# Patient Record
Sex: Male | Born: 1940
Health system: Southern US, Community
[De-identification: ages and names within clinical notes are randomized; demographics above are authoritative.]

## PROBLEM LIST (undated history)

## (undated) DIAGNOSIS — I071 Rheumatic tricuspid insufficiency: Secondary | ICD-10-CM

## (undated) DIAGNOSIS — I10 Essential (primary) hypertension: Secondary | ICD-10-CM

## (undated) DIAGNOSIS — I5032 Chronic diastolic (congestive) heart failure: Secondary | ICD-10-CM

## (undated) DIAGNOSIS — C189 Malignant neoplasm of colon, unspecified: Secondary | ICD-10-CM

## (undated) DIAGNOSIS — Z5181 Encounter for therapeutic drug level monitoring: Secondary | ICD-10-CM

## (undated) DIAGNOSIS — G4733 Obstructive sleep apnea (adult) (pediatric): Secondary | ICD-10-CM

## (undated) DIAGNOSIS — J449 Chronic obstructive pulmonary disease, unspecified: Secondary | ICD-10-CM

## (undated) DIAGNOSIS — D126 Benign neoplasm of colon, unspecified: Secondary | ICD-10-CM

## (undated) DIAGNOSIS — I499 Cardiac arrhythmia, unspecified: Secondary | ICD-10-CM

## (undated) DIAGNOSIS — E785 Hyperlipidemia, unspecified: Secondary | ICD-10-CM

## (undated) DIAGNOSIS — G629 Polyneuropathy, unspecified: Secondary | ICD-10-CM

## (undated) DIAGNOSIS — L409 Psoriasis, unspecified: Secondary | ICD-10-CM

## (undated) DIAGNOSIS — I4891 Unspecified atrial fibrillation: Secondary | ICD-10-CM

## (undated) DIAGNOSIS — Z9289 Personal history of other medical treatment: Secondary | ICD-10-CM

## (undated) DIAGNOSIS — I2510 Atherosclerotic heart disease of native coronary artery without angina pectoris: Secondary | ICD-10-CM

## (undated) DIAGNOSIS — I4821 Permanent atrial fibrillation: Secondary | ICD-10-CM

## (undated) DIAGNOSIS — R001 Bradycardia, unspecified: Secondary | ICD-10-CM

## (undated) DIAGNOSIS — I452 Bifascicular block: Secondary | ICD-10-CM

## (undated) DIAGNOSIS — T7840XA Allergy, unspecified, initial encounter: Secondary | ICD-10-CM

## (undated) DIAGNOSIS — Z7901 Long term (current) use of anticoagulants: Secondary | ICD-10-CM

## (undated) DIAGNOSIS — I272 Pulmonary hypertension, unspecified: Secondary | ICD-10-CM

## (undated) DIAGNOSIS — L271 Localized skin eruption due to drugs and medicaments taken internally: Secondary | ICD-10-CM

## (undated) DIAGNOSIS — I6529 Occlusion and stenosis of unspecified carotid artery: Secondary | ICD-10-CM

## (undated) HISTORY — DX: Occlusion and stenosis of unspecified carotid artery: I65.29

## (undated) HISTORY — DX: Chronic obstructive pulmonary disease, unspecified: J44.9

## (undated) HISTORY — PX: COLONOSCOPY: SHX174

## (undated) HISTORY — DX: Obstructive sleep apnea (adult) (pediatric): G47.33

## (undated) HISTORY — DX: Essential (primary) hypertension: I10

## (undated) HISTORY — DX: Polyneuropathy, unspecified: G62.9

## (undated) HISTORY — DX: Malignant neoplasm of colon, unspecified: C18.9

## (undated) HISTORY — DX: Permanent atrial fibrillation: I48.21

## (undated) HISTORY — DX: Hyperlipidemia, unspecified: E78.5

## (undated) HISTORY — DX: Encounter for therapeutic drug level monitoring: Z79.01

## (undated) HISTORY — DX: Psoriasis, unspecified: L40.9

## (undated) HISTORY — DX: Encounter for therapeutic drug level monitoring: Z51.81

## (undated) HISTORY — DX: Benign neoplasm of colon, unspecified: D12.6

## (undated) HISTORY — DX: Allergy, unspecified, initial encounter: T78.40XA

## (undated) HISTORY — PX: CARDIAC CATHETERIZATION: SHX172

## (undated) HISTORY — DX: Localized skin eruption due to drugs and medicaments taken internally: L27.1

## (undated) HISTORY — PX: TONSILLECTOMY: SUR1361

## (undated) HISTORY — DX: Atherosclerotic heart disease of native coronary artery without angina pectoris: I25.10

## (undated) HISTORY — DX: Personal history of other medical treatment: Z92.89

---

## 1997-03-19 DIAGNOSIS — I2510 Atherosclerotic heart disease of native coronary artery without angina pectoris: Secondary | ICD-10-CM

## 1997-03-19 HISTORY — DX: Atherosclerotic heart disease of native coronary artery without angina pectoris: I25.10

## 1997-03-19 HISTORY — PX: CORONARY STENT PLACEMENT: SHX1402

## 1998-01-11 ENCOUNTER — Inpatient Hospital Stay (HOSPITAL_COMMUNITY): Admission: RE | Admit: 1998-01-11 | Discharge: 1998-01-12 | Payer: Self-pay | Admitting: Cardiology

## 2001-06-01 ENCOUNTER — Emergency Department (HOSPITAL_COMMUNITY): Admission: EM | Admit: 2001-06-01 | Discharge: 2001-06-01 | Payer: Self-pay | Admitting: Emergency Medicine

## 2001-07-10 ENCOUNTER — Encounter: Payer: Self-pay | Admitting: Emergency Medicine

## 2001-07-10 ENCOUNTER — Encounter: Admission: RE | Admit: 2001-07-10 | Discharge: 2001-07-10 | Payer: Self-pay | Admitting: Emergency Medicine

## 2006-04-15 ENCOUNTER — Ambulatory Visit: Payer: Self-pay | Admitting: Gastroenterology

## 2006-04-19 DIAGNOSIS — C189 Malignant neoplasm of colon, unspecified: Secondary | ICD-10-CM

## 2006-04-19 HISTORY — DX: Malignant neoplasm of colon, unspecified: C18.9

## 2006-04-24 ENCOUNTER — Ambulatory Visit: Payer: Self-pay | Admitting: Gastroenterology

## 2006-04-24 ENCOUNTER — Encounter (INDEPENDENT_AMBULATORY_CARE_PROVIDER_SITE_OTHER): Payer: Self-pay | Admitting: Specialist

## 2006-04-24 LAB — CONVERTED CEMR LAB
BUN: 12 mg/dL (ref 6–23)
Creatinine, Ser: 0.7 mg/dL (ref 0.4–1.5)

## 2006-04-26 ENCOUNTER — Encounter: Admission: RE | Admit: 2006-04-26 | Discharge: 2006-04-26 | Payer: Self-pay | Admitting: Gastroenterology

## 2006-05-29 ENCOUNTER — Encounter (INDEPENDENT_AMBULATORY_CARE_PROVIDER_SITE_OTHER): Payer: Self-pay | Admitting: *Deleted

## 2006-05-29 ENCOUNTER — Inpatient Hospital Stay (HOSPITAL_COMMUNITY): Admission: RE | Admit: 2006-05-29 | Discharge: 2006-06-04 | Payer: Self-pay | Admitting: General Surgery

## 2006-05-29 HISTORY — PX: RIGHT COLECTOMY: SHX853

## 2006-06-04 ENCOUNTER — Ambulatory Visit: Payer: Self-pay | Admitting: Oncology

## 2006-06-28 ENCOUNTER — Ambulatory Visit (HOSPITAL_COMMUNITY): Admission: RE | Admit: 2006-06-28 | Discharge: 2006-06-28 | Payer: Self-pay | Admitting: General Surgery

## 2006-06-28 ENCOUNTER — Encounter (INDEPENDENT_AMBULATORY_CARE_PROVIDER_SITE_OTHER): Payer: Self-pay | Admitting: *Deleted

## 2006-07-23 ENCOUNTER — Ambulatory Visit: Payer: Self-pay | Admitting: Oncology

## 2006-07-25 LAB — COMPREHENSIVE METABOLIC PANEL
ALT: 23 U/L (ref 0–53)
Albumin: 3.8 g/dL (ref 3.5–5.2)
CO2: 28 mEq/L (ref 19–32)
Calcium: 9 mg/dL (ref 8.4–10.5)
Chloride: 102 mEq/L (ref 96–112)
Glucose, Bld: 95 mg/dL (ref 70–99)
Sodium: 138 mEq/L (ref 135–145)
Total Bilirubin: 1.2 mg/dL (ref 0.3–1.2)
Total Protein: 6.2 g/dL (ref 6.0–8.3)

## 2006-07-25 LAB — CBC WITH DIFFERENTIAL/PLATELET
BASO%: 1.6 % (ref 0.0–2.0)
Eosinophils Absolute: 0.2 10*3/uL (ref 0.0–0.5)
HCT: 41.8 % (ref 38.7–49.9)
LYMPH%: 27.8 % (ref 14.0–48.0)
MCHC: 33.9 g/dL (ref 32.0–35.9)
MONO#: 0.6 10*3/uL (ref 0.1–0.9)
NEUT#: 2.8 10*3/uL (ref 1.5–6.5)
NEUT%: 55.7 % (ref 40.0–75.0)
Platelets: 154 10*3/uL (ref 145–400)
RBC: 4.76 10*6/uL (ref 4.20–5.71)
WBC: 5 10*3/uL (ref 4.0–10.0)
lymph#: 1.4 10*3/uL (ref 0.9–3.3)

## 2006-08-08 LAB — COMPREHENSIVE METABOLIC PANEL
ALT: 21 U/L (ref 0–53)
Albumin: 4.5 g/dL (ref 3.5–5.2)
CO2: 24 mEq/L (ref 19–32)
Chloride: 103 mEq/L (ref 96–112)
Glucose, Bld: 89 mg/dL (ref 70–99)
Potassium: 4.5 mEq/L (ref 3.5–5.3)
Sodium: 138 mEq/L (ref 135–145)
Total Bilirubin: 0.8 mg/dL (ref 0.3–1.2)
Total Protein: 6.5 g/dL (ref 6.0–8.3)

## 2006-08-08 LAB — CBC WITH DIFFERENTIAL/PLATELET
BASO%: 1.7 % (ref 0.0–2.0)
Eosinophils Absolute: 0.1 10*3/uL (ref 0.0–0.5)
LYMPH%: 28.5 % (ref 14.0–48.0)
MCHC: 33.4 g/dL (ref 32.0–35.9)
MONO#: 0.7 10*3/uL (ref 0.1–0.9)
NEUT#: 3.2 10*3/uL (ref 1.5–6.5)
Platelets: 153 10*3/uL (ref 145–400)
RBC: 4.77 10*6/uL (ref 4.20–5.71)
RDW: 14 % (ref 11.2–14.6)
WBC: 5.7 10*3/uL (ref 4.0–10.0)
lymph#: 1.6 10*3/uL (ref 0.9–3.3)

## 2006-08-21 LAB — CBC WITH DIFFERENTIAL/PLATELET
BASO%: 0.9 % (ref 0.0–2.0)
LYMPH%: 26.5 % (ref 14.0–48.0)
MCHC: 35.2 g/dL (ref 32.0–35.9)
MONO#: 0.8 10*3/uL (ref 0.1–0.9)
MONO%: 13.9 % — ABNORMAL HIGH (ref 0.0–13.0)
NEUT#: 3.2 10*3/uL (ref 1.5–6.5)
Platelets: 145 10*3/uL (ref 145–400)
RBC: 4.39 10*6/uL (ref 4.20–5.71)
RDW: 16.1 % — ABNORMAL HIGH (ref 11.2–14.6)
WBC: 5.7 10*3/uL (ref 4.0–10.0)

## 2006-08-21 LAB — COMPREHENSIVE METABOLIC PANEL
ALT: 40 U/L (ref 0–53)
Albumin: 4.3 g/dL (ref 3.5–5.2)
Alkaline Phosphatase: 49 U/L (ref 39–117)
CO2: 24 mEq/L (ref 19–32)
Potassium: 4.6 mEq/L (ref 3.5–5.3)
Sodium: 140 mEq/L (ref 135–145)
Total Bilirubin: 1.1 mg/dL (ref 0.3–1.2)
Total Protein: 6.5 g/dL (ref 6.0–8.3)

## 2006-08-21 LAB — URINALYSIS, MICROSCOPIC - CHCC
Blood: NEGATIVE
Nitrite: NEGATIVE
pH: 6.5 (ref 4.6–8.0)

## 2006-09-04 LAB — CBC WITH DIFFERENTIAL/PLATELET
Basophils Absolute: 0.1 10*3/uL (ref 0.0–0.1)
EOS%: 1.9 % (ref 0.0–7.0)
HCT: 38.6 % — ABNORMAL LOW (ref 38.7–49.9)
HGB: 13.5 g/dL (ref 13.0–17.1)
LYMPH%: 27.3 % (ref 14.0–48.0)
MCH: 31.2 pg (ref 28.0–33.4)
MCV: 89 fL (ref 81.6–98.0)
MONO%: 11.7 % (ref 0.0–13.0)
NEUT%: 58.2 % (ref 40.0–75.0)
Platelets: 138 10*3/uL — ABNORMAL LOW (ref 145–400)
lymph#: 1.7 10*3/uL (ref 0.9–3.3)

## 2006-09-04 LAB — COMPREHENSIVE METABOLIC PANEL
ALT: 29 U/L (ref 0–53)
CO2: 26 mEq/L (ref 19–32)
Creatinine, Ser: 0.9 mg/dL (ref 0.40–1.50)
Total Bilirubin: 1.2 mg/dL (ref 0.3–1.2)

## 2006-09-07 ENCOUNTER — Ambulatory Visit: Payer: Self-pay | Admitting: Oncology

## 2006-09-18 LAB — CBC WITH DIFFERENTIAL/PLATELET
Eosinophils Absolute: 0.1 10*3/uL (ref 0.0–0.5)
HCT: 40.2 % (ref 38.7–49.9)
LYMPH%: 25.7 % (ref 14.0–48.0)
MCHC: 35.9 g/dL (ref 32.0–35.9)
MONO#: 0.7 10*3/uL (ref 0.1–0.9)
NEUT#: 3.7 10*3/uL (ref 1.5–6.5)
NEUT%: 60.9 % (ref 40.0–75.0)
Platelets: 140 10*3/uL — ABNORMAL LOW (ref 145–400)
WBC: 6 10*3/uL (ref 4.0–10.0)

## 2006-09-18 LAB — COMPREHENSIVE METABOLIC PANEL
CO2: 26 mEq/L (ref 19–32)
Creatinine, Ser: 0.99 mg/dL (ref 0.40–1.50)
Glucose, Bld: 90 mg/dL (ref 70–99)
Total Bilirubin: 1.4 mg/dL — ABNORMAL HIGH (ref 0.3–1.2)

## 2006-10-02 LAB — COMPREHENSIVE METABOLIC PANEL
CO2: 24 mEq/L (ref 19–32)
Calcium: 8.9 mg/dL (ref 8.4–10.5)
Creatinine, Ser: 0.81 mg/dL (ref 0.40–1.50)
Glucose, Bld: 107 mg/dL — ABNORMAL HIGH (ref 70–99)
Sodium: 139 mEq/L (ref 135–145)
Total Bilirubin: 1.5 mg/dL — ABNORMAL HIGH (ref 0.3–1.2)
Total Protein: 6.4 g/dL (ref 6.0–8.3)

## 2006-10-02 LAB — CBC WITH DIFFERENTIAL/PLATELET
Eosinophils Absolute: 0.1 10*3/uL (ref 0.0–0.5)
HCT: 38.2 % — ABNORMAL LOW (ref 38.7–49.9)
LYMPH%: 31.2 % (ref 14.0–48.0)
MONO#: 0.7 10*3/uL (ref 0.1–0.9)
NEUT#: 2.6 10*3/uL (ref 1.5–6.5)
NEUT%: 52.2 % (ref 40.0–75.0)
Platelets: 136 10*3/uL — ABNORMAL LOW (ref 145–400)
WBC: 5 10*3/uL (ref 4.0–10.0)

## 2006-10-10 LAB — PROTIME-INR: INR: 1.2 — ABNORMAL LOW (ref 2.00–3.50)

## 2006-10-14 LAB — PROTIME-INR
INR: 1.3 — ABNORMAL LOW (ref 2.00–3.50)
Protime: 15.6 Seconds — ABNORMAL HIGH (ref 10.6–13.4)

## 2006-10-16 LAB — COMPREHENSIVE METABOLIC PANEL
ALT: 36 U/L (ref 0–53)
BUN: 17 mg/dL (ref 6–23)
CO2: 25 mEq/L (ref 19–32)
Creatinine, Ser: 0.8 mg/dL (ref 0.40–1.50)
Total Bilirubin: 1.5 mg/dL — ABNORMAL HIGH (ref 0.3–1.2)

## 2006-10-16 LAB — CBC WITH DIFFERENTIAL/PLATELET
BASO%: 1 % (ref 0.0–2.0)
EOS%: 1.2 % (ref 0.0–7.0)
MCH: 33.4 pg (ref 28.0–33.4)
MCHC: 36.2 g/dL — ABNORMAL HIGH (ref 32.0–35.9)
NEUT%: 53.3 % (ref 40.0–75.0)
RDW: 16.6 % — ABNORMAL HIGH (ref 11.2–14.6)
lymph#: 1.7 10*3/uL (ref 0.9–3.3)

## 2006-10-16 LAB — PROTIME-INR: INR: 1.6 — ABNORMAL LOW (ref 2.00–3.50)

## 2006-10-17 ENCOUNTER — Ambulatory Visit: Payer: Self-pay | Admitting: Oncology

## 2006-10-21 LAB — PROTIME-INR
INR: 2.3 (ref 2.00–3.50)
Protime: 27.6 Seconds — ABNORMAL HIGH (ref 10.6–13.4)

## 2006-10-25 LAB — PROTIME-INR
INR: 1.3 — ABNORMAL LOW (ref 2.00–3.50)
Protime: 15.6 Seconds — ABNORMAL HIGH (ref 10.6–13.4)

## 2006-10-30 LAB — CBC WITH DIFFERENTIAL/PLATELET
BASO%: 0.5 % (ref 0.0–2.0)
Basophils Absolute: 0 10e3/uL (ref 0.0–0.1)
EOS%: 1.9 % (ref 0.0–7.0)
Eosinophils Absolute: 0.1 10e3/uL (ref 0.0–0.5)
HCT: 36.7 % — ABNORMAL LOW (ref 38.7–49.9)
HGB: 13.2 g/dL (ref 13.0–17.1)
LYMPH%: 27.5 % (ref 14.0–48.0)
MCH: 35 pg — ABNORMAL HIGH (ref 28.0–33.4)
MCHC: 36 g/dL — ABNORMAL HIGH (ref 32.0–35.9)
MCV: 97.2 fL (ref 81.6–98.0)
MONO#: 0.9 10e3/uL (ref 0.1–0.9)
MONO%: 15.3 % — ABNORMAL HIGH (ref 0.0–13.0)
NEUT#: 3.4 10e3/uL (ref 1.5–6.5)
NEUT%: 54.8 % (ref 40.0–75.0)
Platelets: 165 10e3/uL (ref 145–400)
RBC: 3.77 10e6/uL — ABNORMAL LOW (ref 4.20–5.71)
RDW: 20.4 % — ABNORMAL HIGH (ref 11.2–14.6)
WBC: 6.1 10e3/uL (ref 4.0–10.0)
lymph#: 1.7 10e3/uL (ref 0.9–3.3)

## 2006-10-30 LAB — COMPREHENSIVE METABOLIC PANEL
AST: 35 U/L (ref 0–37)
Alkaline Phosphatase: 66 U/L (ref 39–117)
BUN: 21 mg/dL (ref 6–23)
Creatinine, Ser: 0.85 mg/dL (ref 0.40–1.50)

## 2006-10-30 LAB — PROTIME-INR
INR: 2 (ref 2.00–3.50)
Protime: 24 s — ABNORMAL HIGH (ref 10.6–13.4)

## 2006-11-05 LAB — PROTIME-INR

## 2006-11-14 LAB — PROTIME-INR
INR: 1.9 — ABNORMAL LOW (ref 2.00–3.50)
Protime: 22.8 Seconds — ABNORMAL HIGH (ref 10.6–13.4)

## 2006-11-25 ENCOUNTER — Ambulatory Visit: Payer: Self-pay | Admitting: Oncology

## 2006-11-27 ENCOUNTER — Ambulatory Visit (HOSPITAL_COMMUNITY): Admission: RE | Admit: 2006-11-27 | Discharge: 2006-11-27 | Payer: Self-pay | Admitting: Oncology

## 2006-11-27 LAB — CBC WITH DIFFERENTIAL/PLATELET
BASO%: 0.7 % (ref 0.0–2.0)
LYMPH%: 20.5 % (ref 14.0–48.0)
MCHC: 35.2 g/dL (ref 32.0–35.9)
MONO#: 0.7 10*3/uL (ref 0.1–0.9)
RBC: 3.55 10*6/uL — ABNORMAL LOW (ref 4.20–5.71)
RDW: 15.1 % — ABNORMAL HIGH (ref 11.2–14.6)
WBC: 6.6 10*3/uL (ref 4.0–10.0)
lymph#: 1.3 10*3/uL (ref 0.9–3.3)

## 2006-11-27 LAB — COMPREHENSIVE METABOLIC PANEL
Alkaline Phosphatase: 81 U/L (ref 39–117)
Glucose, Bld: 111 mg/dL — ABNORMAL HIGH (ref 70–99)
Sodium: 136 mEq/L (ref 135–145)
Total Bilirubin: 1.1 mg/dL (ref 0.3–1.2)
Total Protein: 5.4 g/dL — ABNORMAL LOW (ref 6.0–8.3)

## 2006-11-27 LAB — PROTIME-INR: Protime: 49.2 Seconds — ABNORMAL HIGH (ref 10.6–13.4)

## 2006-11-28 LAB — PROTIME-INR
INR: 3.7 — ABNORMAL HIGH (ref 2.00–3.50)
Protime: 44.4 Seconds — ABNORMAL HIGH (ref 10.6–13.4)

## 2006-12-02 LAB — PROTIME-INR: INR: 1.4 — ABNORMAL LOW (ref 2.00–3.50)

## 2006-12-11 LAB — CBC WITH DIFFERENTIAL/PLATELET
Basophils Absolute: 0.1 10*3/uL (ref 0.0–0.1)
EOS%: 4.1 % (ref 0.0–7.0)
HGB: 13.8 g/dL (ref 13.0–17.1)
MCH: 36.4 pg — ABNORMAL HIGH (ref 28.0–33.4)
MCV: 101.5 fL — ABNORMAL HIGH (ref 81.6–98.0)
MONO%: 7.5 % (ref 0.0–13.0)
NEUT%: 64.6 % (ref 40.0–75.0)
RDW: 14.7 % — ABNORMAL HIGH (ref 11.2–14.6)

## 2006-12-11 LAB — PROTIME-INR: INR: 2.3 (ref 2.00–3.50)

## 2006-12-20 LAB — PROTIME-INR

## 2007-01-02 LAB — PROTIME-INR: INR: 1.9 — ABNORMAL LOW (ref 2.00–3.50)

## 2007-01-15 ENCOUNTER — Ambulatory Visit: Payer: Self-pay | Admitting: Oncology

## 2007-01-17 LAB — PROTIME-INR: INR: 2.2 (ref 2.00–3.50)

## 2007-03-07 ENCOUNTER — Ambulatory Visit: Payer: Self-pay | Admitting: Oncology

## 2007-04-30 ENCOUNTER — Ambulatory Visit: Payer: Self-pay | Admitting: Gastroenterology

## 2007-05-14 ENCOUNTER — Ambulatory Visit: Payer: Self-pay | Admitting: Gastroenterology

## 2007-05-20 ENCOUNTER — Ambulatory Visit: Payer: Self-pay | Admitting: Vascular Surgery

## 2007-06-05 ENCOUNTER — Ambulatory Visit: Payer: Self-pay | Admitting: Oncology

## 2007-06-09 LAB — CBC WITH DIFFERENTIAL/PLATELET
Basophils Absolute: 0 10*3/uL (ref 0.0–0.1)
Eosinophils Absolute: 0.3 10*3/uL (ref 0.0–0.5)
HCT: 45.3 % (ref 38.7–49.9)
HGB: 16 g/dL (ref 13.0–17.1)
LYMPH%: 18.1 % (ref 14.0–48.0)
MCV: 93.2 fL (ref 81.6–98.0)
MONO#: 0.7 10*3/uL (ref 0.1–0.9)
MONO%: 8.2 % (ref 0.0–13.0)
NEUT#: 5.8 10*3/uL (ref 1.5–6.5)
NEUT%: 69.8 % (ref 40.0–75.0)
Platelets: 183 10*3/uL (ref 145–400)

## 2007-06-09 LAB — COMPREHENSIVE METABOLIC PANEL
Alkaline Phosphatase: 64 U/L (ref 39–117)
BUN: 21 mg/dL (ref 6–23)
CO2: 29 mEq/L (ref 19–32)
Glucose, Bld: 109 mg/dL — ABNORMAL HIGH (ref 70–99)
Total Bilirubin: 1 mg/dL (ref 0.3–1.2)

## 2007-06-09 LAB — CEA: CEA: 0.5 ng/mL (ref 0.0–5.0)

## 2007-06-10 ENCOUNTER — Encounter: Admission: RE | Admit: 2007-06-10 | Discharge: 2007-06-10 | Payer: Self-pay | Admitting: Oncology

## 2007-07-15 ENCOUNTER — Ambulatory Visit (HOSPITAL_BASED_OUTPATIENT_CLINIC_OR_DEPARTMENT_OTHER): Admission: RE | Admit: 2007-07-15 | Discharge: 2007-07-15 | Payer: Self-pay | Admitting: Otolaryngology

## 2007-12-11 ENCOUNTER — Ambulatory Visit: Payer: Self-pay | Admitting: Oncology

## 2007-12-15 ENCOUNTER — Encounter: Payer: Self-pay | Admitting: Gastroenterology

## 2008-03-17 ENCOUNTER — Ambulatory Visit: Payer: Self-pay | Admitting: Oncology

## 2008-03-22 ENCOUNTER — Ambulatory Visit: Payer: Self-pay | Admitting: Oncology

## 2008-05-28 ENCOUNTER — Ambulatory Visit: Payer: Self-pay | Admitting: Oncology

## 2008-06-01 ENCOUNTER — Ambulatory Visit (HOSPITAL_COMMUNITY): Admission: RE | Admit: 2008-06-01 | Discharge: 2008-06-01 | Payer: Self-pay | Admitting: Oncology

## 2008-06-01 LAB — COMPREHENSIVE METABOLIC PANEL
ALT: 50 U/L (ref 0–53)
AST: 41 U/L — ABNORMAL HIGH (ref 0–37)
Alkaline Phosphatase: 45 U/L (ref 39–117)
CO2: 29 mEq/L (ref 19–32)
Sodium: 138 mEq/L (ref 135–145)
Total Bilirubin: 1.7 mg/dL — ABNORMAL HIGH (ref 0.3–1.2)
Total Protein: 6.8 g/dL (ref 6.0–8.3)

## 2008-06-01 LAB — CBC WITH DIFFERENTIAL/PLATELET
BASO%: 0.5 % (ref 0.0–2.0)
EOS%: 4.2 % (ref 0.0–7.0)
LYMPH%: 26.3 % (ref 14.0–49.0)
MCHC: 34.5 g/dL (ref 32.0–36.0)
MONO#: 0.4 10*3/uL (ref 0.1–0.9)
Platelets: 154 10*3/uL (ref 140–400)
RBC: 4.84 10*6/uL (ref 4.20–5.82)
WBC: 5.4 10*3/uL (ref 4.0–10.3)
lymph#: 1.4 10*3/uL (ref 0.9–3.3)

## 2008-06-01 LAB — CEA: CEA: 0.8 ng/mL (ref 0.0–5.0)

## 2008-06-03 ENCOUNTER — Encounter: Payer: Self-pay | Admitting: Gastroenterology

## 2008-11-30 ENCOUNTER — Ambulatory Visit: Payer: Self-pay | Admitting: Oncology

## 2008-12-02 ENCOUNTER — Encounter: Payer: Self-pay | Admitting: Gastroenterology

## 2008-12-02 LAB — COMPREHENSIVE METABOLIC PANEL
Albumin: 4.5 g/dL (ref 3.5–5.2)
CO2: 24 mEq/L (ref 19–32)
Glucose, Bld: 98 mg/dL (ref 70–99)
Potassium: 4.6 mEq/L (ref 3.5–5.3)
Sodium: 138 mEq/L (ref 135–145)
Total Protein: 6.7 g/dL (ref 6.0–8.3)

## 2008-12-02 LAB — CEA: CEA: 1.2 ng/mL (ref 0.0–5.0)

## 2009-03-29 ENCOUNTER — Encounter (INDEPENDENT_AMBULATORY_CARE_PROVIDER_SITE_OTHER): Payer: Self-pay | Admitting: *Deleted

## 2009-04-25 ENCOUNTER — Encounter (INDEPENDENT_AMBULATORY_CARE_PROVIDER_SITE_OTHER): Payer: Self-pay | Admitting: *Deleted

## 2009-04-25 ENCOUNTER — Ambulatory Visit: Payer: Self-pay | Admitting: Gastroenterology

## 2009-05-11 ENCOUNTER — Ambulatory Visit: Payer: Self-pay | Admitting: Gastroenterology

## 2009-05-15 ENCOUNTER — Encounter: Payer: Self-pay | Admitting: Gastroenterology

## 2009-05-23 DIAGNOSIS — Z9289 Personal history of other medical treatment: Secondary | ICD-10-CM

## 2009-05-23 HISTORY — DX: Personal history of other medical treatment: Z92.89

## 2009-05-24 ENCOUNTER — Ambulatory Visit: Payer: Self-pay | Admitting: Oncology

## 2009-05-26 ENCOUNTER — Encounter: Payer: Self-pay | Admitting: Gastroenterology

## 2009-05-26 ENCOUNTER — Ambulatory Visit (HOSPITAL_COMMUNITY): Admission: RE | Admit: 2009-05-26 | Discharge: 2009-05-26 | Payer: Self-pay | Admitting: Oncology

## 2009-05-26 LAB — CBC WITH DIFFERENTIAL/PLATELET
Eosinophils Absolute: 0.3 10*3/uL (ref 0.0–0.5)
HCT: 49.3 % (ref 38.4–49.9)
LYMPH%: 22.9 % (ref 14.0–49.0)
MONO#: 0.4 10*3/uL (ref 0.1–0.9)
NEUT#: 3.8 10*3/uL (ref 1.5–6.5)
NEUT%: 64.1 % (ref 39.0–75.0)
Platelets: 194 10*3/uL (ref 140–400)
WBC: 5.9 10*3/uL (ref 4.0–10.3)

## 2009-05-26 LAB — CEA: CEA: 1.2 ng/mL (ref 0.0–5.0)

## 2009-05-26 LAB — COMPREHENSIVE METABOLIC PANEL
CO2: 28 mEq/L (ref 19–32)
Creatinine, Ser: 0.89 mg/dL (ref 0.40–1.50)
Glucose, Bld: 105 mg/dL — ABNORMAL HIGH (ref 70–99)
Total Bilirubin: 1.3 mg/dL — ABNORMAL HIGH (ref 0.3–1.2)
Total Protein: 7 g/dL (ref 6.0–8.3)

## 2009-05-30 ENCOUNTER — Encounter: Payer: Self-pay | Admitting: Gastroenterology

## 2009-08-08 ENCOUNTER — Ambulatory Visit: Payer: Self-pay | Admitting: Gastroenterology

## 2009-08-08 LAB — CONVERTED CEMR LAB: Total Protein: 6.6 g/dL (ref 6.0–8.3)

## 2009-09-08 ENCOUNTER — Emergency Department (HOSPITAL_COMMUNITY): Admission: EM | Admit: 2009-09-08 | Discharge: 2009-09-08 | Payer: Self-pay | Admitting: Emergency Medicine

## 2010-01-13 DIAGNOSIS — Z9289 Personal history of other medical treatment: Secondary | ICD-10-CM

## 2010-01-13 HISTORY — DX: Personal history of other medical treatment: Z92.89

## 2010-01-24 ENCOUNTER — Ambulatory Visit: Payer: Self-pay | Admitting: Oncology

## 2010-01-26 ENCOUNTER — Ambulatory Visit (HOSPITAL_COMMUNITY): Admission: RE | Admit: 2010-01-26 | Discharge: 2010-01-26 | Payer: Self-pay | Admitting: Oncology

## 2010-01-26 LAB — CEA: CEA: 0.8 ng/mL (ref 0.0–5.0)

## 2010-01-26 LAB — COMPREHENSIVE METABOLIC PANEL
ALT: 41 U/L (ref 0–53)
CO2: 28 mEq/L (ref 19–32)
Chloride: 102 mEq/L (ref 96–112)
Sodium: 140 mEq/L (ref 135–145)
Total Bilirubin: 1 mg/dL (ref 0.3–1.2)
Total Protein: 7.1 g/dL (ref 6.0–8.3)

## 2010-01-30 ENCOUNTER — Encounter: Payer: Self-pay | Admitting: Gastroenterology

## 2010-04-09 ENCOUNTER — Encounter: Payer: Self-pay | Admitting: Gastroenterology

## 2010-04-09 ENCOUNTER — Encounter: Payer: Self-pay | Admitting: Oncology

## 2010-04-18 NOTE — Op Note (Signed)
Summary: Laparoscopic-assisted right colectomy   NAME:  [name], [name]              ACCOUNT NO.:  [id number]   MEDICAL RECORD NO.:  [id number]          PATIENT TYPE:  INP   LOCATION:  1614                         FACILITY:  [facility]   PHYSICIAN:  Adolph Pollack, M.D.DATE OF BIRTH:  May 29, 1940   DATE OF PROCEDURE:  05/29/2006  DATE OF DISCHARGE:                               OPERATIVE REPORT   PREOP DIAGNOSIS:  Right colon cancer.   POSTOPERATIVE DIAGNOSIS:  Right colon cancer.   PROCEDURE:  Laparoscopic-assisted right colectomy.   SURGEON:  Adolph Pollack, M.D.   ASSISTANT:  Anselm Pancoast. Zachery Dakins, M.D.   ANESTHESIA:  General.   INDICATION:  This is a 70 year old male who had some of occult blood in  his stool and underwent a colonoscopy.  He is found to have a lesion  near the cecum; and was biopsied and positive for adenocarcinoma.  A CT  scan did not demonstrate any obvious metastatic disease.  He is now  brought to the operating room for a laparoscopically-assisted right  colectomy.  The procedure and risks have been discussed with him  preoperatively.   TECHNIQUE:  He was seen in the holding area; then brought to the  operating room, placed supine on the operating table; and a general  anesthetic was administered.  The Foley catheter was placed in the  bladder.  The abdominal wall was sterilely prepped and draped.  A  subumbilical incision was made through the skin, subcutaneous tissue,  fascial layers, and peritoneum; entering the peritoneal cavity under  direct vision.  A pursestring suture of #0 Vicryl was placed around the  fascial edges.  A Hassan trocar was introduced into the peritoneal  cavity; and a pneumoperitoneum created by insufflation of CO2 gas.   Following this the laparoscope was introduced.  A 10-mm trocar was then  placed in the left upper quadrant and a 5-mm trocar placed in the right  lower quadrant.  I began by mobilizing the distal ileum and  cecum  dividing lateral attachments using the harmonic scalpel.  The right  ureter was identified; and the plane of dissection kept anterior to it.  I continued to mobilize the right colon and using the harmonic scalpel;  and some blunt sweeping type dissection up to the level of the hepatic  flexure.  I then used the harmonic scalpel to mobilize the hepatic  flexure by dividing some of its thin and fatty-type attachments and  mobilize the transverse colon to approximately its midpoint.  I was able  then, to take the hepatic flexure and bring it down to the subumbilical  region without tension.   At this point, I removed the subumbilical trocar and made an extraction  site incision to a limited lower midline incision through all layers.  I  placed a wound protection device in; and brought the cecum and distal  ileum up into the wound; and then divided the distal ileum with the GIA  stapler.  I then brought the transverse colon up into the wound and  dissected the omentum free from it.  I divided the  transverse colon with  the GIA stapler one-third of the way from the hepatic flexure.  I then  divided the mesentery with a LigaSure; and divided the named vessels  between clamps and ligated them.  The specimen was handed off the field;  and the lesion was easily palpable in the area of the cecum.   Following this a side-to-side staple anastomosis was performed between  the distal ileum; and the transverse colon.  The common defect was then  closed in two layers, first a running 3-0 Vicryl full-thickness suture  was used.  Second, interrupted 3-0 silk sutures were used in a Lembert-  type fashion.  A crotch stitch was placed also using 3-0 Vicryl suture.  The anastomosis was patent, viable, and under no tension.  It was  dropped back into the abdominal cavity.   Gloves were changed at this time.  Three liters of saline was used  irrigate out the abdominal cavity.  The fluid was evacuated.   There is  no obvious bleeding noted.  I asked for a sponge count which was  reported to be correct.  The fascia was then closed with a running #1  PDS suture; and at this time the sponge, instrument, and needle count  was reported to be correct.  I subsequently reinsufflated the abdomen  and inspected it; and there was no bleeding noted; and the fascial  closure was solid.  The pneumoperitoneum was released and the remaining  trocars were removed.  All skin incisions were then closed with staples;  and sterile dressings were applied.   He tolerated procedure well without any apparent complications; and was  taken to recovery room in satisfactory condition.      Adolph Pollack, M.D.  Electronically Signed     TJR/MEDQ  D:  05/29/2006  T:  05/31/2006  Job:  [id number]   cc:   Mosetta Putt, M.D.  Fax: [phone]   Venita Lick. Russella Dar, MD, FACG  [address]  [place]  [place] [address]            SP Surgical Pathology - STATUS: Final             By: Gwenlyn Saran  ,        Perform Date: 12Mar08 00:01  Ordered By: Robley Fries        Ordered Date: [phone] 08:32  Facility: [facility]                              Department: CPATH  Service Report Text  [facility]   [address] [place], [place] [address]   [phone]    REPORT OF SURGICAL PATHOLOGY    Case #: [id number]   Patient Name: [name], [name].   Office Chart Number: N/A    MRN: [id number]   Pathologist: Berneta Levins, MD   DOB/Age 04-18-1940 (Age: 86) Gender: M   Date Taken: 05/29/2006   Date Received: 05/30/2006    FINAL DIAGNOSIS    ***MICROSCOPIC EXAMINATION AND DIAGNOSIS***    INTESTINE, SEGMENTAL RESECTION, TERMINAL ILEUM AND RIGHT COLON:   - INVASIVE COLONIC ADENOCARCINOMA, MODERATELY   DIFFERENTIATED, 4.2 CM.   - TUMOR IS OCCURRING IN A TUBULOVILLOUS ADENOMA.   - TUMOR EXTENDS INTO PERI-INTESTINAL SOFT TISSUE.   - MARGINS NEGATIVE.   - SEPARATE 0.8 CM  TUBULAR ADENOMA.    LYMPH  NODES, PERICOLONIC, REGIONAL RESECTION:   - ONE (1) OF TWENTY-TWO (22) LYMPH NODES POSITIVE FOR   METASTATIC COLONIC   ADENOCARCINOMA.    APPENDIX, RESECTION:   - NEGATIVE FOR TUMOR.   - FIBROUS LUMINAL OBLITERATION.    COMMENT   ONCOLOGY TABLE- COLON AND RECTUM    1. Maximum tumor size (cm): 4.2 cm   2. Histology: Adenocarcinoma   3. Grade: G2, moderately differentiated   4. Margins: proximal negative; distal negative;   radial negative   Distance of invasive carcinoma from nearest margin:   radial at 0.5 cm   5. Perforation of visceral peritoneum: Negative   6. Depth of invasion: Into peri-intestinal soft tissue   7. Vascular/lymphatic invasion: Present, LVI   8. Lymph nodes: # examined 22; # positive 1   9. TNM code: pT3, pN1, pMX   10. Comments: There is a 4.2 cm moderately   differentiated invasive colorectal adenocarcinoma of the cecum   that invades into peri-intestinal soft tissue where the serosa is   retracted. Focal residual tubulovillous adenoma is seen at the   edge of the tumor. Lymphovascular space invasion is present. The   margins including the nearest radial margin are negative for   tumor and the tumor does not involve the visceral peritoneum.   There is a separate 0.8 cm tubular adenoma without high grade   dysplasia or carcinoma. In the peri-intestinal soft tissue one   of twenty-two lymph nodes is involved with metastatic   adenocarcinoma. One fragment of tissue felt to possibly   represent a lymph node is a lobulated portion of adipose tissue.   The appendix shows fibrous luminal obliteration. (JAS:caf   05/31/06)    cf   Date Reported: 05/31/2006 Berneta Levins, MD   *** Electronically Signed Out By JAS ***    Clinical information   Right colon cancer (ni)    specimen(s) obtained   Intestine, segmental resection for tumor, terminal ileum and   right colon    Gross Description   Specimen: Ileocolonic segment including  appendix, in formalin   and the margins are stapled closed.   Length: 18 cm consisting of 5 cm of distal ileum, cecum and 13   cm of proximal colon. The appendix measures 7 cm in length x 0.5   cm in diameter and the serosal and cut surfaces are not   remarkable.  Serosa: Smooth and glistening except for a 1 x 0.5 cm retracted   area along the inferior lateral wall of the cecum.   Tumor location: Mucosal surface of the inferior lateral wall of   the cecum.   Tumor size (cm): Tan to red sessile lesion measuring 4.2 cm in   length x 3 cm and is raised up to 0.6 cm. Approximately one-half   of the external surface is granular to nodular whereas the   remaining half shows an ulcerated area measuring 2 x 1 x 0.1 to   0.2 cm in depth.   % Circumference involved by tumor: 30%.   Depth of invasion: The cut surfaces show firm whitish-gray   tissue that extends into but not through the entire thickness of   the muscular portion of the wall except for a central 0.5 cm area   that appears to extend through the wall and into but not through   pericolonic fat for a distance of 0.1 0.2 cm. The pericolonic   soft tissue margin appears to measure at least 0.5 cm. The  mesenteric margin measure at least 3.0cm   Margins: proximal 5 cm; distal 13 cm; radial/mesenteric 0.5   cm/3 cm.   Other mucosal lesions: Within the cecum on the posterior wall 1   cm from the previously described neoplasm is a tan to focally   dark red polypoid lesion measuring 0.8 x 0.4 cm and raised 0.3   cm.   Lymph nodes: Twenty-three ovoid structures tentatively   identified as lymph nodes are dissected from the mesentery that   measure up to 1.5 cm in greatest dimension and none show gross   evidence of metastatic tumor.   Block summary: The serosal and pericolonic surfaces associated   with the neoplasm are marked with black ink and sections are   submitted as follows:   A = shave section of proximal small bowel margin,  bisected   B and C = shave sections of distal colon margin   D J = neoplasm and the serosal and pericolonic margins are   marked with black ink   K = small cecal polyp   L = five mesenteric lymph nodes   M = five mesenteric lymph nodes   N = five mesenteric lymph nodes   O = five mesenteric lymph nodes   P = three mesenteric lymph nodes   Q = appendix   Total number of cassettes seventeen (BM:caf 05/30/06)    cf/

## 2010-04-18 NOTE — Procedures (Signed)
Summary: Colonoscopy  Patient: [name] Note: All result statuses are Final unless otherwise noted.  Tests: (1) Colonoscopy (COL)   COL Colonoscopy           DONE     [facility]     [address]     [place], [place]  [address]           COLONOSCOPY PROCEDURE REPORT           PATIENT:  [name], [name]  MR#:  [id number]     BIRTHDATE:  [birth year], 68 yrs. old  GENDER:  male           ENDOSCOPIST:  Judie Petit T. Russella Dar, MD, [facility]           PROCEDURE DATE:  05/11/2009     PROCEDURE:  Colonoscopy with biopsy, snare polypectomy and hot     biopsy     ASA CLASS:  Class III     INDICATIONS:  1) follow-up of cancer -T3, N1 colon adenocarcinoma,     04/2003.           MEDICATIONS:   Fentanyl 100 mcg IV, Versed 10 mg IV           DESCRIPTION OF PROCEDURE:   After the risks benefits and     alternatives of the procedure were thoroughly explained, informed     consent was obtained.  Digital rectal exam was performed and     revealed no abnormalities.   The LB [id number]-Q180AL [id number] endoscope     was introduced through the anus and advanced to the anastamosis,     without limitations.  The quality of the prep was good, using     MoviPrep.  The instrument was then slowly withdrawn as the colon     was fully examined.     <<PROCEDUREIMAGES>>           FINDINGS:  The right colon was surgically resected and an     ileo-colonic anastamosis was seen in the ascending colon.  There     was a surgical anastomosis in the ascending colon.  Two polyps     were found in the distal transverse colon. They were 4 mm in size.     With hot biopsy forceps, the polyps were cauterized, biopsies were     obtained and sent to pathology.  A sessile polyp was found in the     mid transverse colon. It was 3 mm in size. The polyp was removed     using cold biopsy forceps. A sessile polyp was found in the     proximal transverse colon. It was 5 mm in size. Polyp was snared     without cautery.  Retrieval  was successful.  A sessile polyp was     found in the rectum. It was 4 mm in size. With hot biopsy forceps     the polyps was cauterized, biopsy was obtained and sent to     pathology.  This was otherwise a normal examination of the colon.     Retroflexed views in the rectum revealed internal hemorrhoids,     small.  The time to cecum =  2.67  minutes. The scope was then     withdrawn (time =  12.33  min) from the patient and the procedure     completed.           COMPLICATIONS:  None  ENDOSCOPIC IMPRESSION:     1) Prior right hemi-colectomy     2) Anastomosis in the ascending colon     3) 4 mm Two polyps in the distal transverse colon     4) 3 mm sessile polyp in the mid transverse colon     5) 5 mm sessile polyp in the proximal transverse colon     6) 4 mm sessile polyp in the rectum     7) Internal hemorrhoids           RECOMMENDATIONS:     1) Await pathology results     2) No aspirin or NSAID's for 2 weeks     3) Resume Coumadin today           Amiere Cawley T. Russella Dar, MD, Clementeen Graham           CC: Rolm Baptise, MD, Kirby Funk, MD           n.     Rosalie Doctor:   Venita Lick. Kikue Gerhart at 05/11/2009 04:11 PM           Katha Hamming, [id number]  Note: An exclamation mark (!) indicates a result that was not dispersed into the flowsheet. Document Creation Date: 05/11/2009 4:12 PM _______________________________________________________________________  (1) Order result status: Final Collection or observation date-time: 05/11/2009 15:52 Requested date-time:  Receipt date-time:  Reported date-time:  Referring Physician:   Ordering Physician: Claudette Head [phone]) Specimen Source:  Source: Launa Grill Order Number: [id number] Lab site:   Appended Document: Colonoscopy     Procedures Next Due Date:    Colonoscopy: 04/2011

## 2010-04-18 NOTE — Op Note (Signed)
Summary: Insert Port-A-Cath  NAME:  [name], [name]              ACCOUNT NO.:  [id number]   MEDICAL RECORD NO.:  [id number]          PATIENT TYPE:  AMB   LOCATION:  DAY                          [facility]ACILITY[facility]  [facility]   PHYSICIAN:  Adolph Pollack, M.D.DATE OF BIRTH:  July 12, 1940   DATE OF PROCEDURE:  06/28/2006  DATE OF DISCHARGE:                               OPERATIVE REPORT   PREOPERATIVE DIAGNOSIS:  Colon cancer.   POSTOPERATIVE DIAGNOSIS:  Colon cancer.   PROCEDURE:  Insertion of single-lumen port-A-Cath into left subclavian  vein under fluoroscopic guidance.   SURGEON:  Adolph Pollack, M.D.   ANESTHESIA:  MAC plus local (lidocaine and Marcaine mixed).   INDICATIONS:  This is a 70 year old male with stage III colon cancer.  He requires long-term venous access for chemotherapy.  He now presents  for port-A-Cath insertion.   TECHNIQUE:  He was brought to the operating room, placed supine on the  operating table, and intravenous sedation was given.  A roll was placed  posteriorly.  The hair on the upper chest and neck was clipped and the  area sterilely prepped and draped.  Local anesthetic was infiltrated in  the left subclavian region.  A 16-gauge needle was used to cannulate the  left subclavian vein, and a wire passed in the superior vena cava  verified by fluoroscopy.  A larger incision was made around the wire and  the tract dilated.  Local anesthetic was infiltrated into the left upper  chest wall superficially and deep, and a larger incision was made  through the skin and subcutaneous tissue using electrocautery.  A pocket  for the port was created.  The catheter was then tunneled from the  inferior up through the superior incision.  The dilator introducer  complex was then placed over the wire and into the left subclavian vein.  The dilator and wire were removed, and the catheter was threaded through  the peel-away sheath into the right heart.  The peel-away sheath  was  removed.  Under fluoroscopic guidance, I pulled the catheter back until  the tip was at the junction of the superior vena cava and right atrium.  The catheter was then cut and threaded onto the port.  The port easily  aspirated blood and flushed.  The port was anchored to the chest wall  with interrupted 2-0 Vicryl sutures.  Concentrated solution was then  placed in the port.   The subcutaneous tissue was closed over the port with running 3-0 Vicryl  suture.  Both skin incisions were closed with 4-0 Monocryl subcuticular  stitches.  Steri-Strips and sterile dressings were applied.   He tolerated the procedure without any apparent complications and was  taken to recovery in satisfactory condition.  Discharge instructions  were given.     Adolph Pollack, M.D.  Electronically Signed    TJR/MEDQ  D:  06/28/2006  T:  06/28/2006  Job:  [id number]

## 2010-04-18 NOTE — Procedures (Signed)
Summary: Colonoscopy and biopsy   Colonoscopy  Procedure date:  04/24/2006  Findings:      Results: Colon Cancer.      Location:  [facility].    Procedures Next Due Date:    Colonoscopy: 04/2008  Colonoscopy  Procedure date:  04/24/2006  Findings:      Results: Colon Cancer.      Location:  [facility].    Procedures Next Due Date:    Colonoscopy: 04/2008 Patient Name: [name], [name] MRN:  Procedure Procedures: Colonoscopy CPT: 04540.    with biopsy. CPT: Q5068410.    with polypectomy. CPT: A3573898.  Personnel: Endoscopist: Venita Lick. Russella Dar, MD, Clementeen Graham. Resident: Mosetta Putt, Judie Petit D.  Exam Location: Exam performed in Outpatient Clinic. Outpatient  Patient Consent: Procedure, Alternatives, Risks and Benefits discussed, consent obtained, from patient. Consent was obtained by the RN.  Indications  Average Risk Screening Routine.  History  Current Medications: Patient is not currently taking Coumadin.  Pre-Exam Physical: Performed Apr 24, 2006. Cardio-pulmonary exam, Rectal exam, HEENT exam , Abdominal exam, Mental status exam WNL.  Comments: Pt. history reviewed/updated, physical exam performed prior to initiation of sedation?Yes Exam Exam: Extent of exam reached: Cecum, extent intended: Cecum.  The cecum was identified by appendiceal orifice and IC valve. Time to Cecum: 00:04: 26. Time for Withdrawl: 00:10:51. Colon retroflexion performed. ASA Classification: II. Tolerance: excellent.  Monitoring: Pulse and BP monitoring, Oximetry used. Supplemental O2 given.  Colon Prep Used MoviPrep for colon prep. Prep results: good.  Sedation Meds: Patient assessed and found to be appropriate for moderate (conscious) sedation. Fentanyl 75 mcg. given IV. Versed 9 mg. given IV.  Findings NORMAL EXAM: Ascending Colon to Descending Colon.  POLYP: Cecum, Maximum size: 6 mm. sessile polyp. not removed, ICD9: Colon Polyps: 211.3.  - TUMOR: in  Cecum. Length: 4 cm, fungating, with no obstruction. Biopsy/Tumor taken. 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  POLYP: Sigmoid Colon, Maximum size: 10 mm. pedunculated polyp. Procedure:  snare with cautery, removed, retrieved, Polyp sent to pathology. ICD9: Colon Polyps: 211.3.  HEMORRHOIDS: Internal. Not bleeding. Not thrombosed. ICD9: Hemorrhoids, Internal: 455.0.   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  211.3: Colon Polyps.  455.0: Hemorrhoids, Internal.   Events  Unplanned Interventions: No intervention was required.  Unplanned Events: There were no complications. Plans  Post Exam Instructions: Post sedation instructions given.  Medication Plan: Await pathology.  Patient Education: Patient given standard instructions for: Polyps. Hemorrhoids.  Disposition: After procedure patient sent to recovery. After recovery patient sent home.  Scheduling/Referral: Colonoscopy, to [facility] T. Russella Dar, MD, [facility], around Apr 24, 2008.  Surgery, for consideration of R hemicolectomy, next available appt,    Patient Name: [name], Burkitt MRN:  Amendment 1 - Jun 03, 2006 0 Procedure Personnel: DELETED<<Resident: Mosetta Putt, M.D. >>DELETED Referred By: Mosetta Putt, M.D.  [Fellow] This report was created from the original endoscopy report, which was reviewed and signed by the above listed endoscopist.    SP Surgical Pathology - STATUS: Final             By: Threasa Beards  ,        Perform Date: 6 Feb08 00:01  Ordered By: Rica Records Date: 7 Feb08 07:44  Facility: [facility]  Department: CPATH  Service Report Text  [facility]   [address] [address]   [place], [place] [address]   Telephone [phone] or [phone] Fax [phone]    REPORT OF SURGICAL PATHOLOGY    Case #: [id number]   Patient Name: [name], Keller.   Office Chart Number: N/A    MRN:  [id number]   Pathologist: Havery Moros, MD   DOB/Age [birth year] (Age: 62) Gender: M   Date Taken: 04/24/2006   Date Received: 04/24/2006    FINAL DIAGNOSIS    ***MICROSCOPIC EXAMINATION AND DIAGNOSIS***    1. CECUM, BIOPSY: INVASIVE ADENOCARCINOMA, SEE COMMENT.    2. SIGMOID COLON, POLYP(S): ADENOMATOUS POLYP(S). NO HIGH   GRADE DYSPLASIA OR INVASIVE MALIGNANCY IDENTIFIED.    COMMENT   1. 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 Dr. Clelia Croft reviewed this biopsy and concurs. The   results were discussed with Dr. Russella Dar on 04/27/06 by Dr. Laureen Ochs.   (BNS:cdc:04/26/06)    cc   Date Reported: 04/26/2006 Havery Moros, MD   *** Electronically Signed Out By BNS ***    Clinical information   R/O cancer; screening (gt)    specimen(s) obtained   1: Colon, biopsy, cecum mass   2: Colon, polyp(s), sigmoid    Gross Description   1. Received in formalin are tan, soft tissue fragments that are   submitted in toto. Number: Five   Size: 0.2 to 0.3 cm    2. Received in formalin is a polypoid portion of soft tan tissue   that measures 0.8 x 0.6 x 0.5 cm.   Block Summary: The base is marked with black ink and base is   bisected and submitted entirely in one cassette. (BM:mj 04/25/06)    mj/

## 2010-04-18 NOTE — Letter (Signed)
Summary: Colonoscopy Letter  [facility]  [address] [place], [place] [address]   Phone: [phone]  Fax: [phone]      March 29, 2009 MRN: [id number]   [facility] [address] [place], [place]  [address]   Dear Mr. Deupree,   According to your medical record, it is time for you to schedule a Colonoscopy. The American Cancer Society recommends this procedure as a method to detect early colon cancer. Patients with a family history of colon cancer, or a personal history of colon polyps or inflammatory bowel disease are at increased risk.  This letter has beeen generated based on the recommendations made at the time of your procedure. If you feel that in your particular situation this may no longer apply, please contact our office.  Please call our office at [phone] to schedule this appointment or to update your records at your earliest convenience.  Thank you for cooperating with [place] to provide you with the very best care possible.   Sincerely,  Judie Petit T. Russella Dar, M.D.  [facility] [phone]

## 2010-04-18 NOTE — Letter (Signed)
[facility]: [facility]   Imported By: Lennie Odor 06/29/2009 15:19:53  _____________________________________________________________________  External Attachment:    Type:   Image     Comment:   External Document

## 2010-04-18 NOTE — Procedures (Signed)
Summary: Colonoscopy   Colonoscopy  Procedure date:  05/14/2007  Findings:      Results: Hemorrhoids.     Location:  [facility].    Procedures Next Due Date:    Colonoscopy: 05/2009  Colonoscopy  Procedure date:  05/14/2007  Findings:      Results: Hemorrhoids.     Location:  [facility].    Procedures Next Due Date:    Colonoscopy: 05/2009 Patient Name: [name], Keller MRN:  Procedure Procedures: Colonoscopy CPT: [address].  Personnel: Endoscopist: Venita Lick. Russella Dar, MD, Clementeen Graham.  Exam Location: Exam performed in Outpatient Clinic. Outpatient  Patient Consent: Procedure, Alternatives, Risks and Benefits discussed, consent obtained, from patient. Consent was obtained by the RN.  Indications  Surveillance of: Colorectal Cancer. T-Score T3: Serosa/adventitia. N-Score N1: Regional. M-Score M0: No distant metastasis. Index tumor removed in 2008, month of Feb.  History  Current Medications: Patient is currently taking Coumadin.  Pre-Exam Physical: Performed May 14, 2007. Cardio-pulmonary exam, Rectal exam, HEENT exam , Abdominal exam, Mental status exam WNL.  Comments: Pt. history reviewed/updated, physical exam performed prior to initiation of sedation?Yes Exam Exam: Extent of exam reached: Anastamosis Site, extent intended: Anastamosis Site.  Time to Cecum: 00:02:01. Time for Withdrawl: 00: 10:18. Colon retroflexion performed. ASA Classification: III. Tolerance: excellent.  Monitoring: Pulse and BP monitoring, Oximetry used. Supplemental O2 given.  Colon Prep Used MoviPrep for colon prep. Prep results: good.  Sedation Meds: Patient assessed and found to be appropriate for moderate (conscious) sedation. Fentanyl 75 mcg. given IV. Versed 7 mg. given IV.  Findings - PRIOR SURGERY: Right Hemicolectomy. Anastamosis present.  NORMAL EXAM: Hepatic Flexure to Sigmoid Colon.  HEMORRHOIDS: Internal. Size: Small. Not bleeding. Not  thrombosed. ICD9: Hemorrhoids, Internal: 455.0.   Assessment  Diagnoses: 455.0: Hemorrhoids, Internal.   Events  Unplanned Interventions: No intervention was required.  Unplanned Events: There were no complications. Plans  Post Exam Instructions: Post sedation instructions given. Restart medications: Coumadin today and Lovenox tomorrow.  Patient Education: Patient given standard instructions for: Hemorrhoids.  Disposition: After procedure patient sent to recovery. After recovery patient sent home.  Scheduling/Referral: Colonoscopy, to [facility] T. Russella Dar, MD, [facility], around May 13, 2009.    This report was created from the original endoscopy report, which was reviewed and signed by the above listed endoscopist.    cc: Mosetta Putt, MD

## 2010-04-18 NOTE — Letter (Signed)
Summary: [facility] Instructions  [facility]  [address] [place], [place] [address]   Phone: [phone]  Fax: [phone]       [name]    [birth year]    MRN: [id number]        Procedure Day Dorna Bloom: Wednesday February 23rd,2011     Arrival Time: 2:00pm     Procedure Time: 3:00pm     Location of Procedure:                    _ x_   Endoscopy Center (4th Floor)                        PREPARATION FOR COLONOSCOPY WITH MOVIPREP   Starting 5 days prior to your procedure  05/06/09 do not eat nuts, seeds, popcorn, corn, beans, peas,  salads, or any raw vegetables.  Do not take any fiber supplements (e.g. Metamucil, Citrucel, and Benefiber).  THE DAY BEFORE YOUR PROCEDURE         DATE: 05/10/09    DAY:  Tuesday   1.  Drink clear liquids the entire day-NO SOLID FOOD  2.  Do not drink anything colored red or purple.  Avoid juices with pulp.  No orange juice.  3.  Drink at least 64 oz. (8 glasses) of fluid/clear liquids during the day to prevent dehydration and help the prep work efficiently.  CLEAR LIQUIDS INCLUDE: Water Jello Ice Popsicles Tea (sugar ok, no milk/cream) Powdered fruit flavored drinks Coffee (sugar ok, no milk/cream) Gatorade Juice: apple, white grape, white cranberry  Lemonade Clear bullion, consomm, broth Carbonated beverages (any kind) Strained chicken noodle soup Hard Candy                             4.  In the morning, mix first dose of MoviPrep solution:    Empty 1 Pouch A and 1 Pouch B into the disposable container    Add lukewarm drinking water to the top line of the container. Mix to dissolve    Refrigerate (mixed solution should be used within 24 hrs)  5.  Begin drinking the prep at 5:00 p.m. The MoviPrep container is divided by 4 marks.   Every 15 minutes drink the solution down to the next mark (approximately 8 oz) until the full liter is complete.   6.  Follow completed prep with 16 oz of clear liquid of your  choice (Nothing red or purple).  Continue to drink clear liquids until bedtime.  7.  Before going to bed, mix second dose of MoviPrep solution:    Empty 1 Pouch A and 1 Pouch B into the disposable container    Add lukewarm drinking water to the top line of the container. Mix to dissolve    Refrigerate  THE DAY OF YOUR PROCEDURE      DATE:  05/11/09  DAY:   Wednesday  Beginning at  10:00 a.m. (5 hours before procedure):         1. Every 15 minutes, drink the solution down to the next mark (approx 8 oz) until the full liter is complete.  2. Follow completed prep with 16 oz. of clear liquid of your choice.    3. You may drink clear liquids until   1:00pm (2 HOURS BEFORE PROCEDURE).   MEDICATION INSTRUCTIONS  Unless otherwise instructed, you should take regular prescription medications with a small sip of  water   as early as possible the morning of your procedure.  Diabetic patients - see separate instructions.  Stop taking Plavix or Aggrenox on  _  _  (7 days before procedure).     Stop taking Coumadin on  _ _  (5 days before procedure).  Additional medication instructions: You will be contaced by our office prior to your procedure for directions on holding your Coumadin/Warfarin.  If you do not hear from our office 1 week prior to your scheduled procedure, please call [phone] to discuss.          OTHER INSTRUCTIONS  You will need a responsible adult at least 68 years of age to accompany you and drive you home.   This person must remain in the waiting room during your procedure.  Wear loose fitting clothing that is easily removed.  Leave jewelry and other valuables at home.  However, you may wish to bring a book to read or  an iPod/MP3 player to listen to music as you wait for your procedure to start.  Remove all body piercing jewelry and leave at home.  Total time from sign-in until discharge is approximately 2-3 hours.  You should go home directly after your  procedure and rest.  You can resume normal activities the  day after your procedure.  The day of your procedure you should not:   Drive   Make legal decisions   Operate machinery   Drink alcohol   Return to work  You will receive specific instructions about eating, activities and medications before you leave.    The above instructions have been reviewed and explained to me by   _______________________    I fully understand and can verbalize these instructions _____________________________ Date _________

## 2010-04-18 NOTE — Letter (Signed)
Summary: Patient Notice- Polyp Results  [facility]  [address] [place], [place] [address]   Phone: [phone]  Fax: [phone]        May 15, 2009 MRN: [id number]    [name] [address] [place], [place]  [address]    Dear Mr. Erno,  I am pleased to inform you that the colon polyp(s) removed during your recent colonoscopy was (were) found to be benign (no cancer detected) upon pathologic examination.  I recommend you have a repeat colonoscopy examination in 2 years to look for recurrent polyps, as having colon polyps increases your risk for having recurrent polyps or even colon cancer in the future.  Should you develop new or worsening symptoms of abdominal pain, bowel habit changes or bleeding from the rectum or bowels, please schedule an evaluation with either your primary care physician or with me.  Continue treatment plan as outlined the day of your exam.  Please call us if you are having persistent problems or have questions about your condition that have not been fully answered at this time.  Sincerely,  Meryl Dare MD [facility]  This letter has been electronically signed by your physician.  Appended Document: Patient Notice- Polyp Results Letter mailed 3.1.11

## 2010-04-18 NOTE — Letter (Signed)
Summary: Anticoagulation Modification Letter  [facility]  [address] [place], [place] [address]   Phone: [phone]  Fax: [phone]    April 25, 2009  Re:    [name] DOB:    29-Oct-1940 MRN:    [id number]    Dear Dr. Allyson Sabal,  We have scheduled the above patient for an endoscopic procedure. Our records show that  he/she is on anticoagulation therapy. Please advise as to how long the patient may come off their therapy of warfarin prior to the scheduled procedure(s) on 05/11/09.   Please fax back/or route the completed form to [place] at [phone].  Thank you for your help with this matter.  Sincerely,  Christie Nottingham CMA Duncan Dull)   Physician Recommendation:  Hold Plavix 5 days prior ________________  Hold Coumadin 5 days prior ____________  Other ______________________________     Appended Document: Anticoagulation Modification Letter Per Samara Deist at Dr. Hazle Coca office pt will have to be bridged with lovenox before procedure and she states they will contact the pt and take care of this for [place]. Letter to be scanned into EMR.

## 2010-04-18 NOTE — Letter (Signed)
Summary: [facility]  [facility]   Imported By: Lester [place] 02/15/2010 09:01:30  _____________________________________________________________________  External Attachment:    Type:   Image     Comment:   External Document

## 2010-04-18 NOTE — Assessment & Plan Note (Signed)
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 He is maintained on Coumadin anticoagulation for atrial fibrillation. He has stable, hyperlipidemia, hypertension, coronary artery disease, and COPD.   GI Review of Systems      Denies abdominal pain, acid reflux, belching, bloating, chest pain, dysphagia with liquids, dysphagia with solids, heartburn, loss of appetite, nausea, vomiting, vomiting blood, weight loss, and  weight gain.        Denies anal fissure, black tarry stools, change in bowel habit, constipation, diarrhea, diverticulosis, fecal incontinence, heme positive stool, hemorrhoids, irritable bowel syndrome, jaundice, light color stool, liver problems, rectal bleeding, and  rectal pain.   Current Medications (verified): 1)  Aspirin 81 Mg Tbec (Aspirin) .[name].. 1 By Mouth Once Daily 2)  Metoprolol Tartrate 25 Mg Tabs (Metoprolol Tartrate) .[name].. 1 By Mouth Once Daily 3)  Simvastatin 80 Mg Tabs (Simvastatin) .[name].. 1 By Mouth Once Daily 4)  Cialis 5 Mg Tabs (Tadalafil) .[name].. 1 By Mouth Once Daily 5)  Warfarin Sodium 7.5 Mg Tabs (Warfarin Sodium) .[name].. 1 By Mouth Once Daily 6)  Daily Multi  Tabs (Multiple Vitamins-Minerals) .[name].. 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 No FH of Colon Cancer:  Social History: Reviewed history from 04/21/2009 and no changes required. Married Patient has never smoked.  Alcohol Use - yes Illicit Drug Use - no  Review of Systems       The patient complains of sleeping problems.         The pertinent positives and negatives are noted as above and in the HPI. All other ROS were reviewed and were negative.   Vital Signs:  Patient profile:   70 year old male Height:      75 inches Weight:      221.38 pounds BMI:     27.77 Pulse rate:   64 / minute Pulse rhythm:   regular BP sitting:   100 / 60  (left arm)  Vitals Entered By: Chales Abrahams CMA Duncan Dull) (April 25, 2009 9:45 AM)  Physical Exam  General:  Well developed, well nourished, no acute distress. Head:  Normocephalic and atraumatic. Eyes:  PERRLA, no icterus. Mouth:  No deformity or lesions, dentition normal. Lungs:  Clear throughout to auscultation except for rales at right base. Heart:  Regular rate and rhythm; no murmurs, rubs,  or bruits. Abdomen:  Soft, nontender and nondistended. No masses, hepatosplenomegaly or hernias noted. Normal bowel sounds. Rectal:  deferred until time of colonoscopy.   Psych:  Alert and cooperative. Normal mood and affect.  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 The risks, benefits and alternatives to colonoscopy with possible biopsy and possible polypectomy were discussed with the patient and they consent to proceed. The procedure will be scheduled electively. Orders: Colonoscopy (Colon)  Problem # 2:  ENCOUNTER FOR LONG-TERM USE OF ANTICOAGULANTS  (ICD-V58.61) Plan for 5 days hold Coumadin prior to the colonoscopy to allow for possible biopsy, and possible polypectomy if needed. Will obtain clearance from Dr. Allyson Sabal or Dr. Valentina Lucks.  Patient Instructions: 1)  Colonoscopy brochure given.  2)  Conscious Sedation brochure given.  3)  Copy sent to : Kirby Funk, MD 4)                       Nanetta Batty, MD 5)  The medication list was reviewed and reconciled.  All changed / newly prescribed medications were explained.  A complete medication list was provided to the patient / caregiver.  Prescriptions: MOVIPREP 100 GM  SOLR (PEG-KCL-NACL-NASULF-NA ASC-C) As per prep instructions.  #1 x 0   Entered by:   Christie Nottingham CMA (AAMA)   Authorized by:   Meryl Dare MD [facility]   Signed by:   Meryl Dare MD [facility] on 04/25/2009   Method used:   Electronically to        [facility]* (retail)       [address]       [place], [place]  [id number]       Ph: [phone]       Fax: [phone]   RxID:   [id number]

## 2010-04-18 NOTE — Procedures (Signed)
Summary: Request to hold Warfarin prior to procedure/[facility]  Request to hold Warfarin prior to procedure/[facility]   Imported By: Sherian Rein 05/02/2009 08:29:43  _____________________________________________________________________  External Attachment:    Type:   Image     Comment:   External Document

## 2010-05-07 ENCOUNTER — Emergency Department (HOSPITAL_COMMUNITY): Payer: Medicare Other

## 2010-05-07 ENCOUNTER — Emergency Department (HOSPITAL_COMMUNITY)
Admission: EM | Admit: 2010-05-07 | Discharge: 2010-05-07 | Disposition: A | Discharge status: Home / Self Care | Payer: Medicare Other | Attending: Emergency Medicine | Admitting: Emergency Medicine

## 2010-05-07 DIAGNOSIS — M25419 Effusion, unspecified shoulder: Secondary | ICD-10-CM | POA: Insufficient documentation

## 2010-05-07 DIAGNOSIS — Z79899 Other long term (current) drug therapy: Secondary | ICD-10-CM | POA: Insufficient documentation

## 2010-05-07 DIAGNOSIS — S40019A Contusion of unspecified shoulder, initial encounter: Secondary | ICD-10-CM | POA: Insufficient documentation

## 2010-05-07 DIAGNOSIS — M25519 Pain in unspecified shoulder: Secondary | ICD-10-CM | POA: Insufficient documentation

## 2010-05-07 DIAGNOSIS — Y92009 Unspecified place in unspecified non-institutional (private) residence as the place of occurrence of the external cause: Secondary | ICD-10-CM | POA: Insufficient documentation

## 2010-05-07 DIAGNOSIS — Z7901 Long term (current) use of anticoagulants: Secondary | ICD-10-CM | POA: Insufficient documentation

## 2010-05-07 DIAGNOSIS — F29 Unspecified psychosis not due to a substance or known physiological condition: Secondary | ICD-10-CM | POA: Insufficient documentation

## 2010-05-07 DIAGNOSIS — I10 Essential (primary) hypertension: Secondary | ICD-10-CM | POA: Insufficient documentation

## 2010-05-07 DIAGNOSIS — W010XXA Fall on same level from slipping, tripping and stumbling without subsequent striking against object, initial encounter: Secondary | ICD-10-CM | POA: Insufficient documentation

## 2010-05-07 DIAGNOSIS — I4891 Unspecified atrial fibrillation: Secondary | ICD-10-CM | POA: Insufficient documentation

## 2010-05-07 DIAGNOSIS — Z85038 Personal history of other malignant neoplasm of large intestine: Secondary | ICD-10-CM | POA: Insufficient documentation

## 2010-05-07 DIAGNOSIS — S060X1A Concussion with loss of consciousness of 30 minutes or less, initial encounter: Secondary | ICD-10-CM | POA: Insufficient documentation

## 2010-05-07 DIAGNOSIS — E7800 Pure hypercholesterolemia, unspecified: Secondary | ICD-10-CM | POA: Insufficient documentation

## 2010-05-07 LAB — DIFFERENTIAL
Basophils Absolute: 0.1 10*3/uL (ref 0.0–0.1)
Eosinophils Relative: 3 % (ref 0–5)
Lymphocytes Relative: 20 % (ref 12–46)
Lymphs Abs: 2.4 10*3/uL (ref 0.7–4.0)
Monocytes Absolute: 1.2 10*3/uL — ABNORMAL HIGH (ref 0.1–1.0)
Neutro Abs: 8.1 10*3/uL — ABNORMAL HIGH (ref 1.7–7.7)
Neutrophils Relative %: 66 % (ref 43–77)

## 2010-05-07 LAB — POCT I-STAT, CHEM 8
Creatinine, Ser: 0.9 mg/dL (ref 0.4–1.5)
HCT: 50 % (ref 39.0–52.0)
Hemoglobin: 17 g/dL (ref 13.0–17.0)
Potassium: 4 mEq/L (ref 3.5–5.1)
Sodium: 136 mEq/L (ref 135–145)
TCO2: 22 mmol/L (ref 0–100)

## 2010-05-07 LAB — URINALYSIS, ROUTINE W REFLEX MICROSCOPIC
Bilirubin Urine: NEGATIVE
Hgb urine dipstick: NEGATIVE
Ketones, ur: 15 mg/dL — AB
Nitrite: NEGATIVE
Urine Glucose, Fasting: NEGATIVE mg/dL

## 2010-05-07 LAB — APTT: aPTT: 38 seconds — ABNORMAL HIGH (ref 24–37)

## 2010-05-07 LAB — CBC
HCT: 48.5 % (ref 39.0–52.0)
MCHC: 34 g/dL (ref 30.0–36.0)
Platelets: 204 10*3/uL (ref 150–400)
RDW: 12.8 % (ref 11.5–15.5)
WBC: 12.2 10*3/uL — ABNORMAL HIGH (ref 4.0–10.5)

## 2010-05-07 LAB — PROTIME-INR: Prothrombin Time: 28.3 seconds — ABNORMAL HIGH (ref 11.6–15.2)

## 2010-06-04 LAB — TYPE AND SCREEN

## 2010-06-04 LAB — PROTIME-INR
INR: 5.27 (ref 0.00–1.49)
Prothrombin Time: 48 seconds — ABNORMAL HIGH (ref 11.6–15.2)

## 2010-06-04 LAB — COMPREHENSIVE METABOLIC PANEL
Albumin: 4.2 g/dL (ref 3.5–5.2)
BUN: 18 mg/dL (ref 6–23)
Chloride: 102 mEq/L (ref 96–112)
Creatinine, Ser: 0.81 mg/dL (ref 0.4–1.5)
GFR calc non Af Amer: >60 mL/min (ref >60)
Glucose, Bld: 94 mg/dL (ref 70–99)
Total Bilirubin: 2 mg/dL — ABNORMAL HIGH (ref 0.3–1.2)

## 2010-06-04 LAB — CBC
HCT: 45.4 % (ref 39.0–52.0)
Hemoglobin: 14.8 g/dL (ref 13.0–17.0)
MCH: 32.1 pg (ref 26.0–34.0)
MCHC: 32.6 g/dL (ref 30.0–36.0)

## 2010-06-04 LAB — DIFFERENTIAL
Basophils Relative: 0 % (ref 0–1)
Eosinophils Absolute: 0.2 10*3/uL (ref 0.0–0.7)
Monocytes Absolute: 0.7 10*3/uL (ref 0.1–1.0)
Monocytes Relative: 10 % (ref 3–12)

## 2010-06-04 LAB — APTT: aPTT: 62 seconds — ABNORMAL HIGH (ref 24–37)

## 2010-08-01 NOTE — Procedures (Signed)
CAROTID DUPLEX EXAM   INDICATION:  Follow-up evaluation of known carotid artery disease.   HISTORY:  Diabetes:  No.  Cardiac:  PTCA in 1999 by Dr. Allyson Sabal.  Hypertension:  Yes.  Smoking:  Quit in 1999.  Previous Surgery:  No.  CV History:  Patient reports no cerebrovascular symptoms at this time.  On 06/05/05, the patient had a low 60-79% right ICA stenosis and a less  than 40% left ICA stenosis.  Amaurosis Fugax No, Paresthesias No, Hemiparesis No                                       RIGHT             LEFT  Brachial systolic pressure:         92                106  Brachial Doppler waveforms:         Biphasic          Biphasic  Vertebral direction of flow:        Antegrade         Antegrade  DUPLEX VELOCITIES (cm/sec)  CCA peak systolic                   82                92  ECA peak systolic                   120               97  ICA peak systolic                   146               71  ICA end diastolic                   27                32  PLAQUE MORPHOLOGY:                  Calcified, irregular                Soft  PLAQUE AMOUNT:                      Moderate          Mild  PLAQUE LOCATION:                    Proximal ICA      Proximal ICA   IMPRESSION:  1. 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  2. 20-39% left internal carotid artery stenosis.  3. No significant change since the previous study performed on      06/05/05.   ___________________________________________  Quita Skye. Hart Rochester, M.D.   MC/MEDQ  D:  05/20/2007  T:  05/20/2007  Job:  [id number]

## 2010-08-01 NOTE — Assessment & Plan Note (Signed)
[facility]                         GASTROENTEROLOGY OFFICE NOTE   NAME:[name], [name]                     MRN:          [id number]  DATE:04/30/2007                            DOB:          04-21-1940    REFERRING PHYSICIAN:  Leighton Roach. Truett Perna, M.D.   REASON FOR CONSULTATION:  Personal history of colon cancer on Coumadin  anticoagulation.   HISTORY OF PRESENT ILLNESS:  This is a very nice 70 year old white male  who was diagnosed with stage III colon cancer in February of 2008.  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  He underwent a laparoscopic  assisted right hemicolectomy by Adolph Pollack, M.D.  He completed  11 cycles of adjuvant FOLFOX chemotherapy.  He last saw Dr. Truett Perna on  January 17, 2007.  The patient relates he underwent a complete physical  examination approximately two weeks ago by Mosetta Putt, M.D.  including blood work that was all unremarkable.  He has been maintained  on Coumadin anticoagulation for a history of atrial fibrillation.  He  also has a Port-A-Cath.  He has no colorectal complaints and  specifically denies any change in bowel habits, abdominal pain, change  in stool caliber, melena, or hematochezia.   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   ALLERGIES:  No known drug allergies.   SOCIAL HISTORY:   REVIEW OF SYSTEMS:  See the hand written form.   PHYSICAL EXAMINATION:  GENERAL:  A well-developed white male in no acute  distress.  VITAL SIGNS:  Height 6 feet 3 inches, weight 214 pounds, blood pressure  102/60, pulse 60  and regular.  HEENT:  Anicteric sclerae.  Oropharynx clear.  NECK:  Without thyromegaly or adenopathy appreciated.  CHEST:  Clear to auscultation bilaterally.  HEART:  Regular rate and rhythm without murmurs appreciated.  ABDOMEN:  Soft, nontender, and nondistended.  Normal active bowel  sounds.  No palpable organomegaly, masses, or hernias.  RECTAL:  Deferred to time of colonoscopy.  EXTREMITIES:  Without cyanosis, clubbing, or edema.  NEUROLOGY:  Alert and oriented x3.  Grossly nonfocal.   ASSESSMENT:  Personal history of stage III right colon cancer, status  post right hemicolectomy.  Risks, benefits, and alternatives to  colonoscopy with possible biopsy and possible polypectomy performed off  Coumadin anticoagulation discussed with the patient and he consents to  proceed.  We will obtain clearance from Nanetta Batty, M.D. regarding a  five-day hold on Coumadin.  Coumadin can be restarted on the day of his  colonoscopy.     Venita Lick. Russella Dar, MD, [facility]  Electronically Signed    MTS/MedQ  DD: 04/30/2007  DT: 05/01/2007  Job #: [id number]   cc:   Mosetta Putt, M.D.  Leighton Roach. Truett Perna, M.D.  Nanetta Batty, M.D.

## 2010-08-01 NOTE — Op Note (Signed)
NAMESUHAIL, PELOQUIN              ACCOUNT NO.:  [id number]   MEDICAL RECORD NO.:  [id number]          PATIENT TYPE:  AMB   LOCATION:  DSC                          FACILITY:  [facility]   PHYSICIAN:  Adolph Pollack, M.D.DATE OF BIRTH:  1940-07-08   DATE OF PROCEDURE:  07/15/2007  DATE OF DISCHARGE:                               OPERATIVE REPORT   PREOPERATIVE DIAGNOSIS:  Retained Port-A-Cath.   POSTOPERATIVE DIAGNOSIS:  Retained Port-A-Cath.   PROCEDURE:  Port-A-Cath removal.   SURGEON:  Adolph Pollack, MD.   ANESTHESIA:  Local (lidocaine) with MAC.   INDICATIONS:  This is a 70 year old male who had colon cancer and  received chemotherapy through the Port-A-Cath.  He no longer needs the  Port-A-Cath and now presents for removal.  Procedure and risks were  discussed with him preoperatively.   TECHNIQUE:  He is brought to the operating room, placed upon on the  operating table and given some intravenous sedation.  The left upper  chest wall was then sterilely prepped and draped.  Local anesthetic was  infiltrated over the Port-A-Cath site and the previous incision was re-  incised down to the subcutaneous tissue.  The catheter was identified  and the fibrous sheath dissected free from it.  The catheter was then  removed.  Pressure was then held in the infraclavicular region on the  left side for approximately 15 minutes.  The fibrous capsule around the  port was then excised, allowing the port and the catheter to be removed  in toto.  Hemostasis was adequate.   The wound was then closed in two layers with a running 4-0 Vicryl for  the subcutaneous tissue and running 4-0 Monocryl subcuticular stitch for  the skin.  Steri-Strips and sterile dressing were applied.  He tolerated  the procedure without any apparent complications and was taken to the  recovery room in satisfactory condition.      Adolph Pollack, M.D.  Electronically Signed     TJR/MEDQ  D:  07/15/2007   T:  07/16/2007  Job:  [id number]

## 2010-08-04 NOTE — Discharge Summary (Signed)
[name], [name]              ACCOUNT NO.:  [id number]   MEDICAL RECORD NO.:  [id number]          PATIENT TYPE:  INP   LOCATION:  1424                         FACILITY:  [facility]   PHYSICIAN:  Adolph Pollack, M.D.DATE OF BIRTH:  [birth year]   DATE OF ADMISSION:  05/29/2006  DATE OF DISCHARGE:                               DISCHARGE SUMMARY   PRINCIPAL DISCHARGE DIAGNOSIS:  Stage III colon cancer.   SECONDARY DIAGNOSES:  1. Postoperative atrial fibrillation.  2. Coronary artery disease.  3. Hypercholesterolemia.  4. Postoperative ileus.  5. Hypokalemia.   PROCEDURE:  Laparoscopic-assisted right colectomy.   REASON FOR ADMISSION:  This 69 year old male underwent a colonoscopy and  was found to have a large lesion in the proximal right colon area.  It  was biopsied and positive for adenocarcinoma.  He is admitted for the  above procedure.   HOSPITAL COURSE:  He underwent the above procedure and tolerated that  well.  He had a mild ileus and we were able to slowly advance his diet.  On his third postoperative day, he had atrial fibrillation with rapid  ventricular response and was transferred to the step-down unit for  monitoring.  Cardiology consultation was obtained and he was started on  IV Cardizem and he was able to convert to sinus rhythm.  His pathology  came back demonstrating one lymph node involved-- a stage III cancer.  His ileus improved and we were able to advance his diet and then  transfer from the telemetry up to the floor where he was ambulatory,  feeling better.  He did have some hypokalemia to 3.1 and he was given  supplementation for that.  Did check a urinalysis as he had a little bit  of a low grade fever of 100 but that was negative.  By his sixth  postoperative day, his bowels were working, he was tolerating a diet.  He was in sinus rhythm on added medications which include increasing his  Toprol to twice a day and adding Cardizem CD 120 mg orally  daily and he  was able to be discharged.   DISPOSITION:  Discharged home June 04, 2006.  He will have the staples  removed and Steri-Strips and benzoin applied.  I have given discharge  instructions including activity restrictions and dietary  recommendations.   DISCHARGE MEDICATIONS:  1. Metoprolol 25 mg b.i.d.  2. Cardizem CD 120 mg daily.  3. Aspirin 81 mg daily.  4. Tylox for pain.  5. Potassium chloride 20 mEq daily.  6. He is to resume his Lipitor and vitamins as before.   He will follow-up with me in the office in 3 weeks and Dr. Allyson Sabal in 2-3  weeks.      Adolph Pollack, M.D.  Electronically Signed     TJR/MEDQ  D:  06/04/2006  T:  06/04/2006  Job:  [id number]   cc:   Mosetta Putt, M.D.  Fax: [phone]   Venita Lick. Russella Dar, MD, FACG  [address]  [place]  [place] [address]   Nanetta Batty, M.D.  Fax: [phone]

## 2010-12-12 LAB — POCT I-STAT, CHEM 8
BUN: 19
Chloride: 100
HCT: 53 — ABNORMAL HIGH
Potassium: 3.9
Sodium: 139

## 2010-12-12 LAB — PROTIME-INR
INR: 1.1
Prothrombin Time: 14.2

## 2010-12-12 LAB — APTT: aPTT: 25

## 2011-01-01 ENCOUNTER — Encounter: Payer: Self-pay | Admitting: *Deleted

## 2011-02-13 ENCOUNTER — Other Ambulatory Visit: Payer: Self-pay | Admitting: Oncology

## 2011-02-13 ENCOUNTER — Other Ambulatory Visit (HOSPITAL_BASED_OUTPATIENT_CLINIC_OR_DEPARTMENT_OTHER): Payer: Medicare Other | Admitting: Lab

## 2011-02-13 DIAGNOSIS — C180 Malignant neoplasm of cecum: Secondary | ICD-10-CM

## 2011-02-13 DIAGNOSIS — Z23 Encounter for immunization: Secondary | ICD-10-CM

## 2011-02-13 LAB — COMPREHENSIVE METABOLIC PANEL
ALT: 67 U/L — ABNORMAL HIGH (ref 0–53)
CO2: 23 mEq/L (ref 19–32)
Chloride: 103 mEq/L (ref 96–112)
Potassium: 4.4 mEq/L (ref 3.5–5.3)
Sodium: 141 mEq/L (ref 135–145)
Total Bilirubin: 1.4 mg/dL — ABNORMAL HIGH (ref 0.3–1.2)
Total Protein: 6.5 g/dL (ref 6.0–8.3)

## 2011-02-13 LAB — CBC WITH DIFFERENTIAL/PLATELET
Basophils Absolute: 0 10*3/uL (ref 0.0–0.1)
EOS%: 4.1 % (ref 0.0–7.0)
HGB: 16.7 g/dL (ref 13.0–17.1)
MCH: 32.9 pg (ref 27.2–33.4)
MCV: 96.4 fL (ref 79.3–98.0)
MONO%: 7.1 % (ref 0.0–14.0)
RDW: 13.6 % (ref 11.0–14.6)

## 2011-02-13 LAB — CEA: CEA: 0.8 ng/mL (ref 0.0–5.0)

## 2011-02-19 ENCOUNTER — Ambulatory Visit: Payer: Medicare Other | Admitting: Oncology

## 2011-02-19 ENCOUNTER — Telehealth: Payer: Self-pay

## 2011-02-19 NOTE — Telephone Encounter (Signed)
Pt called per Dr Kalman Drape request to r/s appt from today.  Pt verbalizes understanding NOT to come today and that he will receive call from schedulers with new appt.   Message routed to scheduling.  dph

## 2011-02-23 ENCOUNTER — Other Ambulatory Visit: Payer: Self-pay | Admitting: *Deleted

## 2011-02-26 ENCOUNTER — Telehealth: Payer: Self-pay | Admitting: Oncology

## 2011-02-26 NOTE — Telephone Encounter (Signed)
S/w the pt's wife and she is aware of the appts in dec

## 2011-03-01 ENCOUNTER — Telehealth: Payer: Self-pay | Admitting: Oncology

## 2011-03-01 ENCOUNTER — Ambulatory Visit (HOSPITAL_BASED_OUTPATIENT_CLINIC_OR_DEPARTMENT_OTHER): Payer: Medicare Other | Admitting: Oncology

## 2011-03-01 VITALS — BP 101/61 | HR 62 | Temp 97.8°F | Ht 74.0 in | Wt 233.9 lb

## 2011-03-01 DIAGNOSIS — Z85038 Personal history of other malignant neoplasm of large intestine: Secondary | ICD-10-CM

## 2011-03-01 DIAGNOSIS — C180 Malignant neoplasm of cecum: Secondary | ICD-10-CM

## 2011-03-01 DIAGNOSIS — G589 Mononeuropathy, unspecified: Secondary | ICD-10-CM

## 2011-03-01 NOTE — Telephone Encounter (Signed)
gve the pt his June 2013 appt calendar °

## 2011-03-02 NOTE — Progress Notes (Signed)
OFFICE PROGRESS NOTE   INTERVAL HISTORY:   He returns as scheduled. He has a good appetite and energy level. He has no new complaint. He continues to have mild neuropathy symptoms in the feet. This does not interfere with activity.  Objective:  Vital signs in last 24 hours:  Blood pressure 101/61, pulse 62, temperature 97.8 F (36.6 C), temperature source Oral, height 6\' 2"  (1.88 m), weight 233 lb 14.4 oz (106.096 kg).    HEENT: Neck without mass Lymphatics: No cervical, supraclavicular, axillary, or inguinal node Resp: Inspiratory rhonchi/rails at the right low posterior chest. No respiratory distress Cardio: Regular rate and rhythm GI: No hepatosplenomegaly. No mass. Vascular: No edema      Lab Results:  Lab Results  Component Value Date   WBC 6.4 02/13/2011   HGB 16.7 02/13/2011   HCT 48.9 02/13/2011   MCV 96.4 02/13/2011   PLT 154 02/13/2011   CEA 0.8 on 02/13/2003   Medications: I have reviewed the patient's current medications.  Assessment/Plan: 1. Stage III colon cancer, status post a right colectomy May 29, 2006, followed by 11 cycles of adjuvant FOLFOX chemotherapy, with the last treatment given November 28, 2006.  A restaging CT May 26, 2009, reveals no evidence for metastatic disease.   2. History of "tearing" secondary to 5-FU chemotherapy - resolved.   3. Hand/foot syndrome secondary to 5-FU - resolved. 4. Oxaliplatin neuropathy - stable.  He reports mild remaining numbness in the feet.  This does not interfere with activity. 5. History of pneumonia September 2008. 6. Atrial fibrillation, maintained on Coumadin and followed by Dr Allyson Sabal.  7. Status post Port-A-Cath removal July 16, 2007. 8. Mild elevation of the bilirubin and transaminases - this has been an intermittent finding over the past several years.  ? related to Zocor therapy.         9.   New tiny nodule in the right lung -seen on a CT of the chest in March of 2011, likely a benign finding.    I reviewed the repeat CT of the chest from November of 2011 with a [facility] radiologist. The lung nodule was not present on the November 2011 study.   Disposition:  Mr. Micale remains in clinical remission from colon cancer. He will return for an office visit and CEA in 6 months. He continues followup colonoscopies with Dr. Russella Dar.   Lucile Shutters, MD  03/02/2011  2:07 PM

## 2011-05-09 ENCOUNTER — Encounter: Payer: Self-pay | Admitting: Gastroenterology

## 2011-06-06 ENCOUNTER — Ambulatory Visit (INDEPENDENT_AMBULATORY_CARE_PROVIDER_SITE_OTHER): Payer: Medicare Other | Admitting: Gastroenterology

## 2011-06-06 ENCOUNTER — Encounter: Payer: Self-pay | Admitting: Gastroenterology

## 2011-06-06 VITALS — BP 118/64 | HR 68 | Ht 75.0 in | Wt 232.0 lb

## 2011-06-06 DIAGNOSIS — Z85038 Personal history of other malignant neoplasm of large intestine: Secondary | ICD-10-CM

## 2011-06-06 MED ORDER — MOVIPREP 100 G PO SOLR: 1.0000 | Freq: Once | ORAL | Status: DC

## 2011-06-06 NOTE — Patient Instructions (Signed)
You have been scheduled for a colonoscopy. Please follow written instructions given to you at your visit today.  Please pick up your prep kit at the pharmacy within the next 1-3 days. cc: Kirby Funk, MD       Nanetta Batty, MD

## 2011-06-06 NOTE — Progress Notes (Signed)
History of Present Illness: This is a 71 year old male with a history of T3, N1 colon cancer diagnosed in 2008. He returns for surveillance colonoscopy. He has stable atrial fibrillation maintained on Coumadin anticoagulation, coronary artery disease, hypertension, hyperlipidemia and COPD. Denies weight loss, abdominal pain, constipation, diarrhea, change in stool caliber, melena, hematochezia, nausea, vomiting, dysphagia, reflux symptoms, chest pain.  Current Medications, Allergies, Past Medical History, Past Surgical History, Family History and Social History were reviewed in [employer] record.  Physical Exam: General: Well developed , well nourished, no acute distress Head: Normocephalic and atraumatic Eyes:  sclerae anicteric, EOMI Ears: Normal auditory acuity Mouth: No deformity or lesions Lungs: Clear throughout to auscultation Heart: Regular rate and rhythm; no murmurs, rubs or bruits Abdomen: Soft, non tender and non distended. No masses, hepatosplenomegaly or hernias noted. Normal Bowel sounds Rectal: Deferred to colonoscopy Musculoskeletal: Symmetrical with no gross deformities  Pulses:  Normal pulses noted Extremities: No clubbing, cyanosis, edema or deformities noted Neurological: Alert oriented x 4, grossly nonfocal Psychological:  Alert and cooperative. Normal mood and affect  Assessment and Recommendations:  1. History of T3, N1 colon cancer. He is due for surveillance colonoscopy. The risks, benefits and alternatives to a five-day hold Coumadin were discussed with the patient and he consents to proceed. Obtain clearance from Dr. Nanetta Batty. The risks, benefits, and alternatives to colonoscopy with possible biopsy and possible polypectomy were discussed with the patient and they consent to proceed.

## 2011-06-07 ENCOUNTER — Encounter: Payer: Self-pay | Admitting: Gastroenterology

## 2011-06-18 ENCOUNTER — Telehealth: Payer: Self-pay

## 2011-06-18 NOTE — Telephone Encounter (Signed)
Received faxed letter from Dr. Allyson Sabal stating patient needs Lovenox bridge before coming off coumadin and that there office will facilitate this. Letter scanned into Epic stating this. Also called patient to make sure he was aware and patient verbalized understanding.

## 2011-07-09 ENCOUNTER — Telehealth: Payer: Self-pay | Admitting: Gastroenterology

## 2011-07-09 ENCOUNTER — Telehealth: Payer: Self-pay | Admitting: *Deleted

## 2011-07-09 ENCOUNTER — Encounter: Payer: Self-pay | Admitting: Gastroenterology

## 2011-07-09 ENCOUNTER — Ambulatory Visit (AMBULATORY_SURGERY_CENTER): Payer: Medicare Other | Admitting: Gastroenterology

## 2011-07-09 VITALS — BP 131/81 | HR 69 | Temp 97.7°F | Resp 20 | Ht 75.0 in | Wt 232.0 lb

## 2011-07-09 DIAGNOSIS — Z1211 Encounter for screening for malignant neoplasm of colon: Secondary | ICD-10-CM

## 2011-07-09 DIAGNOSIS — Z85038 Personal history of other malignant neoplasm of large intestine: Secondary | ICD-10-CM

## 2011-07-09 DIAGNOSIS — D126 Benign neoplasm of colon, unspecified: Secondary | ICD-10-CM

## 2011-07-09 MED ORDER — SODIUM CHLORIDE 0.9 % IV SOLN: 500.0000 mL | INTRAVENOUS | Status: DC

## 2011-07-09 NOTE — Telephone Encounter (Signed)
Spoke with pt and he is aware not to take any more Lovenox til after his procedure and ok to have his colonoscopy today

## 2011-07-09 NOTE — Telephone Encounter (Signed)
Dr. Russella Dar, This pt states, he got "out of order with my Lovenox bridge."  He took two injections yesterday instead of one.  His colonoscopy is today at 3:00.  He just wanted to make sure that he was still ok to have his procedure today.  Please advise.  Baxter Hire

## 2011-07-09 NOTE — Patient Instructions (Signed)
YOU HAD AN ENDOSCOPIC PROCEDURE TODAY AT THE [facility]: Refer to the procedure report that was given to you for any specific questions about what was found during the examination.  If the procedure report does not answer your questions, please call your gastroenterologist to clarify.  If you requested that your care partner not be given the details of your procedure findings, then the procedure report has been included in a sealed envelope for you to review at your convenience later.  YOU SHOULD EXPECT: Some feelings of bloating in the abdomen. Passage of more gas than usual.  Walking can help get rid of the air that was put into your GI tract during the procedure and reduce the bloating. If you had a lower endoscopy (such as a colonoscopy or flexible sigmoidoscopy) you may notice spotting of blood in your stool or on the toilet paper. If you underwent a bowel prep for your procedure, then you may not have a normal bowel movement for a few days.  DIET: Your first meal following the procedure should be a light meal and then it is ok to progress to your normal diet.  A half-sandwich or bowl of soup is an example of a good first meal.  Heavy or fried foods are harder to digest and may make you feel nauseous or bloated.  Likewise meals heavy in dairy and vegetables can cause extra gas to form and this can also increase the bloating.  Drink plenty of fluids but you should avoid alcoholic beverages for 24 hours.  ACTIVITY: Your care partner should take you home directly after the procedure.  You should plan to take it easy, moving slowly for the rest of the day.  You can resume normal activity the day after the procedure however you should NOT DRIVE or use heavy machinery for 24 hours (because of the sedation medicines used during the test).    SYMPTOMS TO REPORT IMMEDIATELY: A gastroenterologist can be reached at any hour.  During normal business hours, 8:30 AM to 5:00 PM Monday through Friday,  call [phone].  After hours and on weekends, please call the GI answering service at [phone] who will take a message and have the physician on call contact you.   Following lower endoscopy (colonoscopy or flexible sigmoidoscopy):  Excessive amounts of blood in the stool  Significant tenderness or worsening of abdominal pains  Swelling of the abdomen that is new, acute  Fever of 100F or higher  FOLLOW UP: If any biopsies were taken you will be contacted by phone or by letter within the next 1-3 weeks.  Call your gastroenterologist if you have not heard about the biopsies in 3 weeks.  Our staff will call the home number listed on your records the next business day following your procedure to check on you and address any questions or concerns that you may have at that time regarding the information given to you following your procedure. This is a courtesy call and so if there is no answer at the home number and we have not heard from you through the emergency physician on call, we will assume that you have returned to your regular daily activities without incident.  SIGNATURES/CONFIDENTIALITY: You and/or your care partner have signed paperwork which will be entered into your electronic medical record.  These signatures attest to the fact that that the information above on your After Visit Summary has been reviewed and is understood.  Full responsibility of the confidentiality of this   discharge information lies with you and/or your care-partner.   Resume medications. Information given on polyps with D/C Instructions. 

## 2011-07-09 NOTE — Telephone Encounter (Signed)
See phone note from 07-09-11 [id number].  Writer spoke with Dr. Russella Dar, who states it is fine to proceed with the procedure as scheduled  Attempted to reach pt on his cell and home phone- no id on machine; no message left.

## 2011-07-09 NOTE — Op Note (Signed)
[facility] [address] [place], [place]  [address]  COLONOSCOPY PROCEDURE REPORT PATIENT:  [name], [name]  MR#:  [id number] BIRTHDATE:  Jul 19, 1940, 70 yrs. old  GENDER:  male ENDOSCOPIST:  Judie Petit T. Russella Dar, MD, [facility]  PROCEDURE DATE:  07/09/2011 PROCEDURE:  Colonoscopy with snare polypectomy ASA CLASS:  Class III INDICATIONS:  1) surveillance and high-risk screening  2) history of colon cancer: T3, N1 in 2008 MEDICATIONS:   These medications were titrated to patient response per physician's verbal order, Fentanyl 75 mcg IV, Versed 9 mg IV DESCRIPTION OF PROCEDURE:   After the risks benefits and alternatives of the procedure were thoroughly explained, informed consent was obtained.  Digital rectal exam was performed and revealed no abnormalities.   The LB [id number] endoscope was introduced through the anus and advanced to the anastomosis, without limitations.  The quality of the prep was good, using MoviPrep.  The instrument was then slowly withdrawn as the colon was fully examined. <<PROCEDUREIMAGES>> FINDINGS:  The right colon was surgically resected and an ileo-colonic anastamosis was seen.  A sessile polyp was found in the ascending colon. It was 7 mm in size. Polyp was snared without cautery. Retrieval was successful. A sessile polyp was found in the distal transverse colon. It was 5 mm in size. Polyp was snared without cautery. Retrieval was successful. Otherwise normal colonoscopy without other polyps, masses, vascular ectasias, or inflammatory changes. Retroflexed views in the rectum revealed no abnormalities. The time to cecum =  2  minutes. The scope was then withdrawn (time =  11.5  min) from the patient and the procedure completed.  COMPLICATIONS:  None  ENDOSCOPIC IMPRESSION: 1) Prior right hemi-colectomy 2) 7 mm sessile polyp in the ascending colon 3) 5 mm sessile polyp in the distal transverse colon  RECOMMENDATIONS: 1) Await pathology  results 2) Resume Coumadin (warfarin) and Lovenox as scheduled and have your PT/INR checked within 1 week. 3) Repeat Colonoscopy in 2 years  Addisynn Vassell T. Russella Dar, MD, [facility]  CC:  Kirby Funk, MD  n. Rosalie DoctorVenita Lick. Aman Bonet at 07/09/2011 03:23 PM  Katha Hamming, [id number]

## 2011-07-09 NOTE — Telephone Encounter (Signed)
Information told to pt and understanding voiced

## 2011-07-09 NOTE — Telephone Encounter (Signed)
OK to proceed today with colonoscopy No more Lovenox until after colonoscopy

## 2011-07-09 NOTE — Progress Notes (Signed)
Patient did not experience any of the following events: a burn prior to discharge; a fall within the facility; wrong site/side/patient/procedure/implant event; or a hospital transfer or hospital admission upon discharge from the facility. (G8907) Patient did not have preoperative order for IV antibiotic SSI prophylaxis. (G8918)  

## 2011-07-10 ENCOUNTER — Telehealth: Payer: Self-pay

## 2011-07-10 NOTE — Telephone Encounter (Signed)
  Follow up Call-  Call back number 07/09/2011  Post procedure Call Back phone  # [phone]  cell  Permission to leave phone message Yes     Patient questions:  Do you have a fever, pain , or abdominal swelling? no Pain Score  0 *  Have you tolerated food without any problems? yes  Have you been able to return to your normal activities? yes  Do you have any questions about your discharge instructions: Diet   no Medications  no Follow up visit  no  Do you have questions or concerns about your Care? no  Actions: * If pain score is 4 or above: No action needed, pain <4.

## 2011-07-16 ENCOUNTER — Encounter: Payer: Self-pay | Admitting: Gastroenterology

## 2011-08-30 ENCOUNTER — Ambulatory Visit (HOSPITAL_BASED_OUTPATIENT_CLINIC_OR_DEPARTMENT_OTHER): Payer: Medicare Other | Admitting: Oncology

## 2011-08-30 ENCOUNTER — Telehealth: Payer: Self-pay | Admitting: Oncology

## 2011-08-30 ENCOUNTER — Other Ambulatory Visit (HOSPITAL_BASED_OUTPATIENT_CLINIC_OR_DEPARTMENT_OTHER): Payer: Medicare Other | Admitting: Lab

## 2011-08-30 VITALS — BP 111/65 | HR 69 | Temp 97.1°F | Ht 75.0 in | Wt 231.1 lb

## 2011-08-30 DIAGNOSIS — Z85038 Personal history of other malignant neoplasm of large intestine: Secondary | ICD-10-CM

## 2011-08-30 DIAGNOSIS — C180 Malignant neoplasm of cecum: Secondary | ICD-10-CM

## 2011-08-30 NOTE — Telephone Encounter (Signed)
appts made and printed for pt aom °

## 2011-08-30 NOTE — Progress Notes (Signed)
   [facility]    OFFICE PROGRESS NOTE   INTERVAL HISTORY:   He returns as scheduled. He feels well. He is exercising. Good appetite. The neuropathy symptoms at the hands have resolved. He reports mild numbness in the feet. This does not interfere with activity.  Objective:  Vital signs in last 24 hours:  Blood pressure 111/65, pulse 69, temperature 97.1 F (36.2 C), temperature source Oral, height 6\' 3"  (1.905 m), weight 231 lb 1.6 oz (104.826 kg).    HEENT: Neck without mass Lymphatics: No cervical, supraglottic, axillary, or inguinal nodes Resp: Inspiratory rhonchi/rails at the right lower posterior chest. No respiratory distress Cardio: Irregular GI: No hepatosplenic the, no mass Vascular: No leg edema   Lab Results:  CEA pending   Medications: I have reviewed the patient's current medications.  Assessment/Plan: 1. Stage III colon cancer, status post a right colectomy May 29, 2006, followed by 11 cycles of adjuvant FOLFOX chemotherapy, with the last treatment given November 28, 2006. A restaging CT May 26, 2009, reveals no evidence for metastatic disease. He underwent a colonoscopy 07/09/2011. Polyps were removed from the ascending and transverse colon 2. History of "tearing" secondary to 5-FU chemotherapy - resolved.  3. Hand/foot syndrome secondary to 5-FU - resolved. 4. Oxaliplatin neuropathy - stable. He reports mild remaining numbness in the feet. This does not interfere with activity. 5. History of pneumonia September 2008. 6. Atrial fibrillation, maintained on Coumadin and followed by Dr Allyson Sabal.  7. Status post Port-A-Cath removal July 16, 2007. 8. Mild elevation of the bilirubin and transaminases - this has been an intermittent finding over the past several years. ? related to Zocor therapy.  9. New tiny nodule in the right lung -seen on a CT of the chest in March of 2011, likely a benign finding. I reviewed the repeat CT of the chest from  November of 2011 with a [facility] radiologist. The lung nodule was not present on the November 2011 study.  Disposition:  He remains in clinical remission from colon cancer. He would like to continue followup at the cancer Center. Mr. Iiams will return for an office visit and CEA in one year.   Thornton Papas, MD  08/30/2011  1:41 PM

## 2011-09-04 ENCOUNTER — Telehealth: Payer: Self-pay | Admitting: Medical Oncology

## 2011-09-04 NOTE — Telephone Encounter (Signed)
Informed pt that cea is normal and dr Truett Perna want to keep f/u schedule.  Pt verbalized understanding.  dmr

## 2011-09-04 NOTE — Telephone Encounter (Signed)
Informed pt cea is normal. Pt verbalized understanding. Advised to call for questions or concerns.  dmr

## 2011-09-14 ENCOUNTER — Telehealth: Payer: Self-pay | Admitting: *Deleted

## 2011-09-14 NOTE — Telephone Encounter (Signed)
error 

## 2011-10-11 ENCOUNTER — Telehealth: Payer: Self-pay | Admitting: *Deleted

## 2011-10-11 NOTE — Telephone Encounter (Signed)
Reports the numbness in his feet and toes is getting worse and "bothersome". Asking to be seen by physician or would he be willing to call in something to see if his discomfort could be helped? Not in actual pain, just uncomfortable and numb.

## 2011-10-19 ENCOUNTER — Telehealth: Payer: Self-pay | Admitting: *Deleted

## 2011-10-19 NOTE — Telephone Encounter (Signed)
Made patient aware that Dr. Truett Perna said nothing can be done to reverse the neuropathy. Lyrica and Neurontin only help neuropathic pain and he does not have pain. Declines referral to neurologist at this time. Says, "I'll just learn to live with it".

## 2012-06-02 ENCOUNTER — Ambulatory Visit: Payer: Self-pay | Admitting: Cardiovascular Disease

## 2012-06-02 DIAGNOSIS — I4891 Unspecified atrial fibrillation: Secondary | ICD-10-CM | POA: Insufficient documentation

## 2012-06-02 DIAGNOSIS — Z7901 Long term (current) use of anticoagulants: Secondary | ICD-10-CM

## 2012-06-14 ENCOUNTER — Telehealth: Payer: Self-pay | Admitting: Oncology

## 2012-06-14 NOTE — Telephone Encounter (Signed)
Called pt and left message regarding appt r/s from 6/13 due to MD PAL

## 2012-08-21 ENCOUNTER — Ambulatory Visit (INDEPENDENT_AMBULATORY_CARE_PROVIDER_SITE_OTHER): Payer: Medicare Other | Admitting: Pharmacist Clinician (PhC)/ Clinical Pharmacy Specialist

## 2012-08-21 VITALS — BP 122/62 | HR 52

## 2012-08-21 DIAGNOSIS — I4891 Unspecified atrial fibrillation: Secondary | ICD-10-CM

## 2012-08-21 DIAGNOSIS — Z7901 Long term (current) use of anticoagulants: Secondary | ICD-10-CM

## 2012-08-26 ENCOUNTER — Other Ambulatory Visit: Payer: Self-pay | Admitting: *Deleted

## 2012-08-26 ENCOUNTER — Other Ambulatory Visit: Payer: Medicare Other | Admitting: Lab

## 2012-08-26 MED ORDER — SIMVASTATIN 80 MG PO TABS: 80.0000 mg | ORAL_TABLET | Freq: Every day | ORAL | Status: DC

## 2012-08-29 ENCOUNTER — Ambulatory Visit: Payer: Medicare Other | Admitting: Oncology

## 2012-09-01 ENCOUNTER — Other Ambulatory Visit: Payer: Medicare Other | Admitting: Lab

## 2012-09-01 ENCOUNTER — Telehealth: Payer: Self-pay | Admitting: Oncology

## 2012-09-01 ENCOUNTER — Ambulatory Visit (HOSPITAL_BASED_OUTPATIENT_CLINIC_OR_DEPARTMENT_OTHER): Payer: Medicare Other | Admitting: Oncology

## 2012-09-01 VITALS — BP 132/73 | HR 73 | Temp 97.8°F | Resp 18 | Ht 75.0 in | Wt 234.6 lb

## 2012-09-01 DIAGNOSIS — Z85038 Personal history of other malignant neoplasm of large intestine: Secondary | ICD-10-CM

## 2012-09-01 DIAGNOSIS — Z7901 Long term (current) use of anticoagulants: Secondary | ICD-10-CM

## 2012-09-01 DIAGNOSIS — I4891 Unspecified atrial fibrillation: Secondary | ICD-10-CM

## 2012-09-01 NOTE — Progress Notes (Signed)
    [facility]    OFFICE PROGRESS NOTE   INTERVAL HISTORY:   [name] returns as scheduled. He feels well. He is active with working in his garden. Minimal remaining neuropathy symptoms in the hands. He continues to have numbness and discomfort in the feet when walking barefoot. Good appetite.  Objective:  Vital signs in last 24 hours:  Blood pressure 132/73, pulse 73, temperature 97.8 F (36.6 C), temperature source Oral, resp. rate 18, height 6\' 3"  (1.905 m), weight 234 lb 9.6 oz (106.414 kg).    HEENT: Neck without mass Lymphatics: No cervical, supraclavicular, axillary, or inguinal nodes Resp: Lungs clear bilaterally Cardio: Irregular GI: No hepatomegaly, nontender, no mass Vascular: No leg edema   Lab Results:  CEA pending   Medications: I have reviewed the patient's current medications.  Assessment/Plan: 1. Stage III colon cancer, status post a right colectomy May 29, 2006, followed by 11 cycles of adjuvant FOLFOX chemotherapy, with the last treatment given November 28, 2006. A restaging CT May 26, 2009, reveals no evidence for metastatic disease. He underwent a colonoscopy 07/09/2011. Polyps were removed from the ascending and transverse colon-tubular adenomas 2. History of "tearing" secondary to 5-FU chemotherapy - resolved.  3. Hand/foot syndrome secondary to 5-FU - resolved. 4. Oxaliplatin neuropathy - stable. He reports mild remaining numbness in the feet. This does not interfere with activity. 5. History of pneumonia September 2008. 6. Atrial fibrillation, maintained on Coumadin and followed by Dr Allyson Sabal.  7. Status post Port-A-Cath removal July 16, 2007. 8. Mild elevation of the bilirubin and transaminases - this has been an intermittent finding over the past several years. ? related to Zocor therapy.  9. New tiny nodule in the right lung -seen on a CT of the chest in March of 2011, likely a benign finding. I reviewed the repeat CT of the  chest from November of 2011 with a [facility] radiologist. The lung nodule was not present on the November 2011 study.    Disposition:  Mr. Rising remains in remission from colon cancer. We will followup on the CEA from today. He will return for an office visit and CEA in one year. He is due for a surveillance colonoscopy in 2015.   Thornton Papas, MD  09/01/2012  6:17 PM

## 2012-10-02 ENCOUNTER — Ambulatory Visit (INDEPENDENT_AMBULATORY_CARE_PROVIDER_SITE_OTHER): Payer: Medicare Other | Admitting: Pharmacist Clinician (PhC)/ Clinical Pharmacy Specialist

## 2012-10-02 VITALS — BP 106/60 | HR 56

## 2012-10-02 DIAGNOSIS — Z7901 Long term (current) use of anticoagulants: Secondary | ICD-10-CM

## 2012-10-02 DIAGNOSIS — I4891 Unspecified atrial fibrillation: Secondary | ICD-10-CM

## 2012-10-02 LAB — POCT INR: INR: 2.1

## 2012-10-22 ENCOUNTER — Other Ambulatory Visit: Payer: Self-pay

## 2012-11-13 ENCOUNTER — Encounter: Payer: Self-pay | Admitting: Cardiology

## 2012-11-13 ENCOUNTER — Ambulatory Visit: Payer: Medicare Other | Admitting: Pharmacist Clinician (PhC)/ Clinical Pharmacy Specialist

## 2012-11-13 DIAGNOSIS — I6523 Occlusion and stenosis of bilateral carotid arteries: Secondary | ICD-10-CM

## 2012-11-13 DIAGNOSIS — I6529 Occlusion and stenosis of unspecified carotid artery: Secondary | ICD-10-CM | POA: Insufficient documentation

## 2012-11-13 DIAGNOSIS — Z7901 Long term (current) use of anticoagulants: Secondary | ICD-10-CM | POA: Insufficient documentation

## 2012-11-13 DIAGNOSIS — I4821 Permanent atrial fibrillation: Secondary | ICD-10-CM | POA: Insufficient documentation

## 2012-11-13 DIAGNOSIS — Z5181 Encounter for therapeutic drug level monitoring: Secondary | ICD-10-CM

## 2012-11-13 DIAGNOSIS — I2510 Atherosclerotic heart disease of native coronary artery without angina pectoris: Secondary | ICD-10-CM | POA: Insufficient documentation

## 2012-11-18 ENCOUNTER — Ambulatory Visit (INDEPENDENT_AMBULATORY_CARE_PROVIDER_SITE_OTHER): Payer: Medicare Other | Admitting: Pharmacist Clinician (PhC)/ Clinical Pharmacy Specialist

## 2012-11-18 DIAGNOSIS — I4891 Unspecified atrial fibrillation: Secondary | ICD-10-CM

## 2012-11-18 DIAGNOSIS — Z7901 Long term (current) use of anticoagulants: Secondary | ICD-10-CM

## 2012-11-20 ENCOUNTER — Ambulatory Visit (INDEPENDENT_AMBULATORY_CARE_PROVIDER_SITE_OTHER): Payer: Medicare Other | Admitting: Cardiovascular Disease

## 2012-11-20 ENCOUNTER — Encounter: Payer: Self-pay | Admitting: Cardiovascular Disease

## 2012-11-20 VITALS — BP 160/100 | HR 69 | Ht 75.0 in | Wt 227.0 lb

## 2012-11-20 DIAGNOSIS — I6523 Occlusion and stenosis of bilateral carotid arteries: Secondary | ICD-10-CM

## 2012-11-20 DIAGNOSIS — I2510 Atherosclerotic heart disease of native coronary artery without angina pectoris: Secondary | ICD-10-CM

## 2012-11-20 DIAGNOSIS — I658 Occlusion and stenosis of other precerebral arteries: Secondary | ICD-10-CM

## 2012-11-20 DIAGNOSIS — I6529 Occlusion and stenosis of unspecified carotid artery: Secondary | ICD-10-CM

## 2012-11-20 DIAGNOSIS — I10 Essential (primary) hypertension: Secondary | ICD-10-CM | POA: Insufficient documentation

## 2012-11-20 DIAGNOSIS — I4891 Unspecified atrial fibrillation: Secondary | ICD-10-CM

## 2012-11-20 DIAGNOSIS — I4821 Permanent atrial fibrillation: Secondary | ICD-10-CM

## 2012-11-20 DIAGNOSIS — E785 Hyperlipidemia, unspecified: Secondary | ICD-10-CM | POA: Insufficient documentation

## 2012-11-20 NOTE — Assessment & Plan Note (Signed)
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 He is neurologically a symptomatic. We will repeat a carotid Doppler study.

## 2012-11-20 NOTE — Assessment & Plan Note (Signed)
On statin therapy followed by his PCP 

## 2012-11-20 NOTE — Patient Instructions (Addendum)
Your physician wants you to follow-up in: 1 year. You will receive a reminder letter in the mail two months in advance. If you don't receive a letter, please call our office to schedule the follow-up appointment.  

## 2012-11-20 NOTE — Progress Notes (Signed)
11/20/2012 [name]   22-Jun-1940  [id number]  Primary Physician [name], MD Primary Cardiologist: [name] MD [name]   HPI:  The patient is a very pleasant 72 year old, mildly overweight, married Caucasian male father of 2, grandfather of 3 grandchildren who I saw 10 months ago. He has a history of CAD status post RCA stenting by myself January 11, 1998. He had normal circumflex, LAD and ejection fraction. His other problems include hypertension and chronic atrial fibrillation on Coumadin anticoagulation, rate controlled, as well as erectile dysfunction. He is totally asymptomatic. He did have colon cancer picked up on screening colonoscopy and underwent right colectomy May 29, 2006 followed by 11 cycles of adjuvant Folfox chemotherapy. His last lipid profile performed in January revealed a total cholesterol of 168, LDL of 94 and HDL of 55.since I saw him in the office if worse (13 he denies chest pain or shortness of breath. He is very active exercises 6 days a week doing weight training 3 days a week.      Current Outpatient Prescriptions  Medication Sig Dispense Refill  . aspirin 81 MG tablet Take 81 mg by mouth daily.        . Cholecalciferol (VITAMIN D3) 2000 UNITS TABS Take 1 tablet by mouth daily.      . Coenzyme Q10 (COQ10) 400 MG CAPS Take 1 tablet by mouth daily.      . metoprolol tartrate (LOPRESSOR) 25 MG tablet Take 25 mg by mouth daily.      . Omega-3 Fatty Acids (FISH OIL) 1200 MG CAPS Take 1,000 mg by mouth 2 (two) times daily.       . simvastatin (ZOCOR) 80 MG tablet Take 1 tablet (80 mg total) by mouth at bedtime.  30 tablet  3  . Testosterone (ANDROGEL TD) Place 60 mg onto the skin daily. Axeron      . vitamin C (ASCORBIC ACID) 500 MG tablet Take 500 mg by mouth daily.      [name] warfarin (COUMADIN) 7.5 MG tablet Take 7.5 mg by mouth as directed.      . [DISCONTINUED] metoprolol (TOPROL-XL) 50 MG 24 hr tablet Take 50 mg by mouth  daily.         No current facility-administered medications for this visit.    No Known Allergies  History   Social History  . Marital Status: Married    Spouse Name: N/A    Number of Children: N/A  . Years of Education: N/A   Occupational History  . Not on file.   Social History Main Topics  . Smoking status: Never Smoker   . Smokeless tobacco: Never Used  . Alcohol Use: Yes     Comment: 4 glasses of wine daily   . Drug Use: No  . Sexual Activity: Not on file   Other Topics Concern  . Not on file   Social History Narrative  . No narrative on file     Review of Systems: General: negative for chills, fever, night sweats or weight changes.  Cardiovascular: negative for chest pain, dyspnea on exertion, edema, orthopnea, palpitations, paroxysmal nocturnal dyspnea or shortness of breath Dermatological: negative for rash Respiratory: negative for cough or wheezing Urologic: negative for hematuria Abdominal: negative for nausea, vomiting, diarrhea, bright red blood per rectum, melena, or hematemesis Neurologic: negative for visual changes, syncope, or dizziness All other systems reviewed and are otherwise negative except as noted above.    Blood pressure 160/100, pulse 69, height  6\' 3"  (1.905 m), weight 227 lb (102.967 kg).  General appearance: alert and no distress Neck: no adenopathy, no carotid bruit, no JVD, supple, symmetrical, trachea midline and thyroid not enlarged, symmetric, no tenderness/mass/nodules Lungs: clear to auscultation bilaterally Heart: irregularly irregular rhythm Extremities: extremities normal, atraumatic, no cyanosis or edema  EKG atrial fibrillation with a ventricular response of 69, nonspecific ST and T wave changes  ASSESSMENT AND PLAN:   CAD (coronary artery disease) History of CAD status post RCA stenting by myself 01/11/98 was normal LAD, circumflex and ejection fraction. He denies chest pain or shortness of breath. His last Myoview  performed 01/05/10 was nonischemic.  Carotid stenosis, asymptomatic History of moderate right and mild left ICA stenosis but because ultrasound 10/26/11. He is neurologically a symptomatic. We will repeat a carotid Doppler study.  Permanent atrial fibrillation Rate controlled on Coumadin anticoagulation  Hyperlipidemia On statin therapy followed by his PCP      [name] MD [facility], [facility] 11/20/2012 3:18 PM

## 2012-11-20 NOTE — Assessment & Plan Note (Signed)
History of CAD status post RCA stenting by myself 01/11/98 was normal LAD, circumflex and ejection fraction. He denies chest pain or shortness of breath. His last Myoview performed 01/05/10 was nonischemic.

## 2012-11-20 NOTE — Assessment & Plan Note (Signed)
Rate controlled on Coumadin anticoagulation 

## 2012-11-27 ENCOUNTER — Telehealth (HOSPITAL_COMMUNITY): Payer: Self-pay | Admitting: *Deleted

## 2012-11-28 ENCOUNTER — Telehealth (HOSPITAL_COMMUNITY): Payer: Self-pay | Admitting: *Deleted

## 2012-11-28 ENCOUNTER — Other Ambulatory Visit (HOSPITAL_COMMUNITY): Payer: Self-pay | Admitting: Cardiovascular Disease

## 2012-11-28 DIAGNOSIS — I70219 Atherosclerosis of native arteries of extremities with intermittent claudication, unspecified extremity: Secondary | ICD-10-CM

## 2012-12-04 ENCOUNTER — Ambulatory Visit (HOSPITAL_COMMUNITY)
Admission: RE | Admit: 2012-12-04 | Discharge: 2012-12-04 | Disposition: A | Discharge status: Home / Self Care | Payer: Medicare Other | Source: Ambulatory Visit | Attending: Internal Medicine | Admitting: Internal Medicine

## 2012-12-04 DIAGNOSIS — I6523 Occlusion and stenosis of bilateral carotid arteries: Secondary | ICD-10-CM

## 2012-12-04 DIAGNOSIS — I6529 Occlusion and stenosis of unspecified carotid artery: Secondary | ICD-10-CM | POA: Insufficient documentation

## 2012-12-04 NOTE — Progress Notes (Signed)
Carotid Duplex Completed. °Brianna L Mazza,RVT °

## 2012-12-11 ENCOUNTER — Telehealth: Payer: Self-pay | Admitting: Cardiovascular Disease

## 2012-12-11 ENCOUNTER — Encounter: Payer: Self-pay | Admitting: Cardiovascular Disease

## 2012-12-11 NOTE — Telephone Encounter (Signed)
[name] has some questions about his health , please call     Thanks

## 2012-12-11 NOTE — Telephone Encounter (Signed)
Returned call.  Pt stated he tried to sign in to MyChart to get his doppler results b/c he received an e-mail stating he has results available.  Pt stated he cannot sign in for some reason and has called the number at [facility], but they said it is active.  Pt informed doppler from 9.18.14 has not been resulted and there is no information available at this time.  Pt verbalized understanding.  Message forwarded to Samara Deist, RN w/ Dr. Allyson Sabal so that results will not be released in MyChart since pt does not have access.

## 2012-12-18 NOTE — Telephone Encounter (Signed)
I deactivated his chart and then mailed him info to open up a new mychart account.  I also gave him his doppler results.  He will call back with any problems.

## 2012-12-22 ENCOUNTER — Ambulatory Visit (INDEPENDENT_AMBULATORY_CARE_PROVIDER_SITE_OTHER): Payer: Medicare Other | Admitting: Pharmacist Clinician (PhC)/ Clinical Pharmacy Specialist

## 2012-12-22 DIAGNOSIS — Z7901 Long term (current) use of anticoagulants: Secondary | ICD-10-CM

## 2012-12-22 DIAGNOSIS — I4891 Unspecified atrial fibrillation: Secondary | ICD-10-CM

## 2012-12-29 ENCOUNTER — Ambulatory Visit: Payer: Medicare Other | Admitting: Pharmacist Clinician (PhC)/ Clinical Pharmacy Specialist

## 2012-12-30 ENCOUNTER — Ambulatory Visit: Payer: Medicare Other | Admitting: Pharmacist Clinician (PhC)/ Clinical Pharmacy Specialist

## 2013-01-22 ENCOUNTER — Other Ambulatory Visit: Payer: Self-pay | Admitting: Pharmacist Clinician (PhC)/ Clinical Pharmacy Specialist

## 2013-01-22 MED ORDER — WARFARIN SODIUM 7.5 MG PO TABS: ORAL_TABLET | ORAL | Status: DC

## 2013-01-28 ENCOUNTER — Emergency Department (HOSPITAL_COMMUNITY)
Admission: EM | Admit: 2013-01-28 | Discharge: 2013-01-29 | Disposition: A | Discharge status: Home / Self Care | Payer: Medicare Other | Attending: Emergency Medicine | Admitting: Emergency Medicine

## 2013-01-28 ENCOUNTER — Emergency Department (HOSPITAL_COMMUNITY): Payer: Medicare Other

## 2013-01-28 ENCOUNTER — Encounter (HOSPITAL_COMMUNITY): Payer: Self-pay | Admitting: Radiology

## 2013-01-28 DIAGNOSIS — Z85038 Personal history of other malignant neoplasm of large intestine: Secondary | ICD-10-CM | POA: Insufficient documentation

## 2013-01-28 DIAGNOSIS — I2510 Atherosclerotic heart disease of native coronary artery without angina pectoris: Secondary | ICD-10-CM | POA: Insufficient documentation

## 2013-01-28 DIAGNOSIS — R42 Dizziness and giddiness: Secondary | ICD-10-CM | POA: Insufficient documentation

## 2013-01-28 DIAGNOSIS — E785 Hyperlipidemia, unspecified: Secondary | ICD-10-CM | POA: Insufficient documentation

## 2013-01-28 DIAGNOSIS — Z9861 Coronary angioplasty status: Secondary | ICD-10-CM | POA: Insufficient documentation

## 2013-01-28 DIAGNOSIS — R55 Syncope and collapse: Secondary | ICD-10-CM | POA: Insufficient documentation

## 2013-01-28 DIAGNOSIS — Z79899 Other long term (current) drug therapy: Secondary | ICD-10-CM | POA: Insufficient documentation

## 2013-01-28 DIAGNOSIS — Z7982 Long term (current) use of aspirin: Secondary | ICD-10-CM | POA: Insufficient documentation

## 2013-01-28 DIAGNOSIS — S0990XA Unspecified injury of head, initial encounter: Secondary | ICD-10-CM | POA: Insufficient documentation

## 2013-01-28 DIAGNOSIS — I4891 Unspecified atrial fibrillation: Secondary | ICD-10-CM | POA: Insufficient documentation

## 2013-01-28 DIAGNOSIS — Z7901 Long term (current) use of anticoagulants: Secondary | ICD-10-CM | POA: Insufficient documentation

## 2013-01-28 DIAGNOSIS — S0003XA Contusion of scalp, initial encounter: Secondary | ICD-10-CM | POA: Insufficient documentation

## 2013-01-28 DIAGNOSIS — Y9389 Activity, other specified: Secondary | ICD-10-CM | POA: Insufficient documentation

## 2013-01-28 DIAGNOSIS — I10 Essential (primary) hypertension: Secondary | ICD-10-CM | POA: Insufficient documentation

## 2013-01-28 DIAGNOSIS — J4489 Other specified chronic obstructive pulmonary disease: Secondary | ICD-10-CM | POA: Insufficient documentation

## 2013-01-28 DIAGNOSIS — Z8719 Personal history of other diseases of the digestive system: Secondary | ICD-10-CM | POA: Insufficient documentation

## 2013-01-28 DIAGNOSIS — Y92009 Unspecified place in unspecified non-institutional (private) residence as the place of occurrence of the external cause: Secondary | ICD-10-CM | POA: Insufficient documentation

## 2013-01-28 DIAGNOSIS — Z872 Personal history of diseases of the skin and subcutaneous tissue: Secondary | ICD-10-CM | POA: Insufficient documentation

## 2013-01-28 DIAGNOSIS — W19XXXA Unspecified fall, initial encounter: Secondary | ICD-10-CM | POA: Insufficient documentation

## 2013-01-28 DIAGNOSIS — J449 Chronic obstructive pulmonary disease, unspecified: Secondary | ICD-10-CM | POA: Insufficient documentation

## 2013-01-28 DIAGNOSIS — Z8669 Personal history of other diseases of the nervous system and sense organs: Secondary | ICD-10-CM | POA: Insufficient documentation

## 2013-01-28 HISTORY — DX: Unspecified atrial fibrillation: I48.91

## 2013-01-28 LAB — COMPREHENSIVE METABOLIC PANEL
ALT: 50 U/L (ref 0–53)
Albumin: 4.1 g/dL (ref 3.5–5.2)
Alkaline Phosphatase: 60 U/L (ref 39–117)
BUN: 16 mg/dL (ref 6–23)
CO2: 21 mEq/L (ref 19–32)
Chloride: 98 mEq/L (ref 96–112)
Creatinine, Ser: 0.75 mg/dL (ref 0.50–1.35)
GFR calc non Af Amer: >90 mL/min (ref >90)
Potassium: 3.9 mEq/L (ref 3.5–5.1)
Sodium: 138 mEq/L (ref 135–145)
Total Bilirubin: 0.8 mg/dL (ref 0.3–1.2)

## 2013-01-28 LAB — CBC WITH DIFFERENTIAL/PLATELET
Basophils Absolute: 0 K/uL (ref 0.0–0.1)
Basophils Relative: 0 % (ref 0–1)
Eosinophils Absolute: 0.3 K/uL (ref 0.0–0.7)
Eosinophils Relative: 3 % (ref 0–5)
HCT: 50.4 % (ref 39.0–52.0)
Hemoglobin: 17.7 g/dL — ABNORMAL HIGH (ref 13.0–17.0)
Lymphocytes Relative: 26 % (ref 12–46)
Lymphs Abs: 2.4 K/uL (ref 0.7–4.0)
MCH: 33.7 pg (ref 26.0–34.0)
MCHC: 35.1 g/dL (ref 30.0–36.0)
MCV: 95.8 fL (ref 78.0–100.0)
Monocytes Absolute: 0.8 K/uL (ref 0.1–1.0)
Monocytes Relative: 9 % (ref 3–12)
Neutro Abs: 5.7 K/uL (ref 1.7–7.7)
Neutrophils Relative %: 62 % (ref 43–77)
Platelets: 142 K/uL — ABNORMAL LOW (ref 150–400)
RBC: 5.26 MIL/uL (ref 4.22–5.81)
RDW: 12.9 % (ref 11.5–15.5)
WBC: 9.2 K/uL (ref 4.0–10.5)

## 2013-01-28 LAB — APTT: aPTT: 37 s (ref 24–37)

## 2013-01-28 LAB — POCT I-STAT TROPONIN I

## 2013-01-28 LAB — PROTIME-INR: Prothrombin Time: 22.6 seconds — ABNORMAL HIGH (ref 11.6–15.2)

## 2013-01-28 MED ORDER — ACETAMINOPHEN 500 MG PO TABS
1000.0000 mg | ORAL_TABLET | Freq: Once | ORAL | Status: AC
  Administered 2013-01-28: 1000 mg via ORAL
  Filled 2013-01-28: qty 2

## 2013-01-28 NOTE — ED Notes (Signed)
Pt ambulated unassisted without difficulty. Denies dizziness or lightheadedness.

## 2013-01-28 NOTE — ED Notes (Signed)
MD at bedside. 

## 2013-01-28 NOTE — ED Provider Notes (Signed)
CSN: [id number]     Arrival date & time 01/28/13  2050 History   First MD Initiated Contact with Patient 01/28/13 2252     Chief Complaint  Patient presents with  . Fall   (Consider location/radiation/quality/duration/timing/severity/associated sxs/prior Treatment) HPI Comments: 72 yo male with a fib, coumadin, CAD, HTN, intestinal CA hx presents with syncope and head injury.  Pt was sitting in the living room and went to stand up felt lightheaded then next thing he knew he was on the floor.  Wife called ems.  Pt did not recall details.  Pt has had this happen once before.  He did have a few glasses of wine this evening and has had a perforated TM since a flight for which he follows ENT.  No cp, sob, ha or other sxs.  Pt feels at baseline in ED.     Patient is a 72 y.o. male presenting with fall. The history is provided by the patient.  Fall This is a recurrent problem. Associated symptoms include headaches (after fall, frontal ). Pertinent negatives include no chest pain, no abdominal pain and no shortness of breath.    Past Medical History  Diagnosis Date  . CAD (coronary artery disease) 1999    RCA stent  . Allergy     Seasonal--pollen  . Psoriasis   . Colon cancer 04/2006    Stage III--T3 N1  . Hyperlipidemia   . Hypertension   . Neuropathy   . Hand foot syndrome   . COPD (chronic obstructive pulmonary disease)   . Hemorrhoids   . Permanent atrial fibrillation   . Anticoagulation goal of INR 2 to 3     on coumadin  . Carotid stenosis, asymptomatic     moderate RT and mild LT with carotid dopplers 10/26/11  . H/O echocardiogram 05/23/2009    EF >55%, mild MR, Mild TR,   . H/O cardiovascular stress test 01/13/2010    low risk scan, similar to 2008 study  . A-fib    Past Surgical History  Procedure Laterality Date  . Right colectomy  05/29/2006    Laparoscopic assisted  . Coronary stent placement  1999    RCA tandem NIR stents  . Tonsillectomy     Family History   Problem Relation Age of Onset  . Colon cancer Neg Hx   . Heart disease Father 10  . Heart disease Paternal Grandmother   . Heart disease Mother   . Hypertension Mother   . Healthy Sister   . Heart disease Paternal Grandfather   . Hypertension Paternal Grandfather    History  Substance Use Topics  . Smoking status: Never Smoker   . Smokeless tobacco: Never Used  . Alcohol Use: Yes     Comment: 4 glasses of wine daily     Review of Systems  Constitutional: Negative for fever and chills.  HENT: Negative for congestion.   Eyes: Negative for visual disturbance.  Respiratory: Negative for shortness of breath.   Cardiovascular: Negative for chest pain.  Gastrointestinal: Negative for vomiting and abdominal pain.  Genitourinary: Negative for dysuria and flank pain.  Musculoskeletal: Negative for back pain, neck pain and neck stiffness.  Skin: Positive for wound. Negative for rash.  Neurological: Positive for syncope, light-headedness and headaches (after fall, frontal ).    Allergies  Review of patient's allergies indicates no known allergies.  Home Medications   Current Outpatient Rx  Name  Route  Sig  Dispense  Refill  . aspirin 81  MG chewable tablet   Oral   Chew 81 mg by mouth daily.         . Cholecalciferol (VITAMIN D3) 2000 UNITS TABS   Oral   Take 2,000 Units by mouth daily.          . Coenzyme Q10 (COQ10) 400 MG CAPS   Oral   Take 1 tablet by mouth daily.         . metoprolol tartrate (LOPRESSOR) 25 MG tablet   Oral   Take 25 mg by mouth daily.         . Omega-3 Fatty Acids (FISH OIL) 1200 MG CAPS   Oral   Take 1,000 mg by mouth 2 (two) times daily.          . simvastatin (ZOCOR) 80 MG tablet   Oral   Take 1 tablet (80 mg total) by mouth at bedtime.   30 tablet   3   . Testosterone (ANDROGEL TD)   Transdermal   Place 60 mg onto the skin daily. Axeron         . vitamin C (ASCORBIC ACID) 500 MG tablet   Oral   Take 500 mg by mouth  daily.         [name] warfarin (COUMADIN) 7.5 MG tablet   Oral   Take 7.5-15 mg by mouth daily. 1 tablet Sunday, Monday, Wednesday, Friday; 2 tablets Tuesday, Thursday, Saturday          BP 158/82  Pulse 69  Temp(Src) 97.4 F (36.3 C) (Oral)  Resp 20  SpO2 99% Physical Exam  Nursing note and vitals reviewed. Constitutional: He is oriented to person, place, and time. He appears well-developed and well-nourished.  HENT:  Head: Normocephalic.  Mild dry mm  Eyes: Conjunctivae are normal. Right eye exhibits no discharge. Left eye exhibits no discharge.  Neck: Normal range of motion. Neck supple. No tracheal deviation present.  Cardiovascular: Normal rate and regular rhythm.   Pulmonary/Chest: Effort normal and breath sounds normal.  Abdominal: Soft. He exhibits no distension. There is no tenderness. There is no guarding.  Musculoskeletal: He exhibits no edema.  No midline vertebral tenderness Nexus neg, Full rom neck without pain Full rom of knees and hips without pain   Neurological: He is alert and oriented to person, place, and time. No cranial nerve deficit. GCS eye subscore is 4. GCS verbal subscore is 5. GCS motor subscore is 6.  5+ strength in UE and LE with f/e at major joints. Sensation to palpation intact in UE and LE. CNs 2-12 grossly intact.  EOMFI.  PERRL.   Finger nose and coordination intact bilateral.   Visual fields intact to finger testing.   Skin: Skin is warm. No rash noted.  Left frontal hematoma, no laceration, swelling, tender  Psychiatric: He has a normal mood and affect.    ED Course  Procedures (including critical care time) Labs Review Labs Reviewed  CBC WITH DIFFERENTIAL - Abnormal; Notable for the following:    Hemoglobin 17.7 (*)    Platelets 142 (*)    All other components within normal limits  PROTIME-INR - Abnormal; Notable for the following:    Prothrombin Time 22.6 (*)    INR 2.06 (*)    All other components within normal limits   COMPREHENSIVE METABOLIC PANEL  APTT  POCT I-STAT TROPONIN I   Imaging Review Ct Head Wo Contrast  01/28/2013   CLINICAL DATA:  Syncope; hit head on floor, with scalp hematoma. Patient  on Coumadin. Concern for cervical spine injury.  EXAM: CT HEAD WITHOUT CONTRAST  CT CERVICAL SPINE WITHOUT CONTRAST  TECHNIQUE: Multidetector CT imaging of the head and cervical spine was performed following the standard protocol without intravenous contrast. Multiplanar CT image reconstructions of the cervical spine were also generated.  COMPARISON:  CT of the head and cervical spine performed 05/07/2010  FINDINGS: CT HEAD FINDINGS  There is no evidence of acute infarction, mass lesion, or intra- or extra-axial hemorrhage on CT.  Prominence of the sulci suggests mild cortical volume loss. Mild periventricular white matter change likely reflects small vessel ischemic microangiopathy.  The posterior fossa, including the cerebellum, brainstem and fourth ventricle, is within normal limits. The third and lateral ventricles, and basal ganglia are unremarkable in appearance. The cerebral hemispheres are symmetric in appearance, with normal gray-white differentiation. No mass effect or midline shift is seen.  There is no evidence of fracture; visualized osseous structures are unremarkable in appearance. The visualized portions of the orbits are within normal limits. There is partial opacification of the maxillary sinuses bilaterally; the remaining paranasal sinuses and mastoid air cells are well-aerated. A scalp hematoma and laceration are seen overlying the left frontal calvarium.  CT CERVICAL SPINE FINDINGS  There is no evidence of fracture or subluxation. Vertebral bodies demonstrate normal height. There is mild narrowing of the intervertebral disc spaces at C4-C5 and C5-C6, with mild grade 1 retrolisthesis of C5 on C6.  The thyroid gland is unremarkable in appearance. Mild blebs are seen at the lung apices. Calcification is noted  at the carotid bifurcations bilaterally. Fluid is noted filling a thin prevertebral pocket just above the proximal esophagus; this contained a small amount of air on the prior study and appears to reflect the patient's baseline.  IMPRESSION: 1. No evidence of traumatic intracranial injury or fracture. 2. No evidence fracture or subluxation along the cervical spine. 3. Scalp hematoma and laceration overlying the left frontal calvarium. 4. Mild cortical volume loss and minimal small vessel ischemic microangiopathy. 5. Partial opacification of the maxillary sinuses bilaterally. 6. Calcification noted at the carotid bifurcations bilaterally. Carotid ultrasound would be helpful for further evaluation, when and as deemed clinically appropriate. 7. Mild degenerative change of the lower cervical spine. 8. Mild blebs noted at the lung apices. 9. A small amount of prevertebral fluid is noted, above the level of proximal esophagus, within the potential prevertebral space. This may reflect the patient's baseline, as a small amount of air was noted within this pocket on the prior study. Would correlate for associated symptoms.   Electronically Signed   By: Roanna Raider M.D.   On: 01/28/2013 22:10   Ct Cervical Spine Wo Contrast  01/28/2013   CLINICAL DATA:  Syncope; hit head on floor, with scalp hematoma. Patient on Coumadin. Concern for cervical spine injury.  EXAM: CT HEAD WITHOUT CONTRAST  CT CERVICAL SPINE WITHOUT CONTRAST  TECHNIQUE: Multidetector CT imaging of the head and cervical spine was performed following the standard protocol without intravenous contrast. Multiplanar CT image reconstructions of the cervical spine were also generated.  COMPARISON:  CT of the head and cervical spine performed 05/07/2010  FINDINGS: CT HEAD FINDINGS  There is no evidence of acute infarction, mass lesion, or intra- or extra-axial hemorrhage on CT.  Prominence of the sulci suggests mild cortical volume loss. Mild periventricular  white matter change likely reflects small vessel ischemic microangiopathy.  The posterior fossa, including the cerebellum, brainstem and fourth ventricle, is within normal limits. The third  and lateral ventricles, and basal ganglia are unremarkable in appearance. The cerebral hemispheres are symmetric in appearance, with normal gray-white differentiation. No mass effect or midline shift is seen.  There is no evidence of fracture; visualized osseous structures are unremarkable in appearance. The visualized portions of the orbits are within normal limits. There is partial opacification of the maxillary sinuses bilaterally; the remaining paranasal sinuses and mastoid air cells are well-aerated. A scalp hematoma and laceration are seen overlying the left frontal calvarium.  CT CERVICAL SPINE FINDINGS  There is no evidence of fracture or subluxation. Vertebral bodies demonstrate normal height. There is mild narrowing of the intervertebral disc spaces at C4-C5 and C5-C6, with mild grade 1 retrolisthesis of C5 on C6.  The thyroid gland is unremarkable in appearance. Mild blebs are seen at the lung apices. Calcification is noted at the carotid bifurcations bilaterally. Fluid is noted filling a thin prevertebral pocket just above the proximal esophagus; this contained a small amount of air on the prior study and appears to reflect the patient's baseline.  IMPRESSION: 1. No evidence of traumatic intracranial injury or fracture. 2. No evidence fracture or subluxation along the cervical spine. 3. Scalp hematoma and laceration overlying the left frontal calvarium. 4. Mild cortical volume loss and minimal small vessel ischemic microangiopathy. 5. Partial opacification of the maxillary sinuses bilaterally. 6. Calcification noted at the carotid bifurcations bilaterally. Carotid ultrasound would be helpful for further evaluation, when and as deemed clinically appropriate. 7. Mild degenerative change of the lower cervical spine. 8.  Mild blebs noted at the lung apices. 9. A small amount of prevertebral fluid is noted, above the level of proximal esophagus, within the potential prevertebral space. This may reflect the patient's baseline, as a small amount of air was noted within this pocket on the prior study. Would correlate for associated symptoms.   Electronically Signed   By: Roanna Raider M.D.   On: 01/28/2013 22:10    EKG Interpretation     Ventricular Rate:  60 PR Interval:    QRS Duration: 106 QT Interval:  457 QTC Calculation: 457 R Axis:   20 Text Interpretation:  Atrial fibrillation Borderline low voltage, extremity leads Minimal ST depression, inferior leads similar to previous            MDM   1. Syncope   2. Head injury, initial encounter   3. Hematoma of frontal scalp, initial encounter   4. Anticoagulated on Coumadin    Clinically likely syncope from orthostatic/ etoh/ ear perforation however with age cardiac eval done. Observed, no sxs except pain at hematoma site. Discussed possibility of arrythmia although less likely with hpi and delayed bleed on coumadin, pt and wife understand. Discussed case with hospitalist, Dr Julian Reil examined, with recent echo, no significant AS and other causes rec outpt fup, pt/ wife agree.  Stressed strict reasons to return. Results and differential diagnosis were discussed with the patient. Close follow up outpatient was discussed, patient comfortable with the plan.   Diagnosis: Syncope, Head injury     Enid Skeens, MD 01/29/13 [phone]

## 2013-01-28 NOTE — ED Notes (Signed)
Family at bedside. 

## 2013-01-28 NOTE — ED Notes (Signed)
To ED from home via [facility], syncopal episode, hit head on floor, hematoma to back of head, skin tear right hand, is on Coumadin, is A/O but very anxious, tearful, VSS, CBG 110, 18g LAC,, afib on arrival with hx of same, NAD

## 2013-01-29 ENCOUNTER — Ambulatory Visit (INDEPENDENT_AMBULATORY_CARE_PROVIDER_SITE_OTHER): Payer: Medicare Other | Admitting: Physician Assistant

## 2013-01-29 ENCOUNTER — Encounter: Payer: Self-pay | Admitting: Physician Assistant

## 2013-01-29 VITALS — BP 138/90 | HR 64 | Ht 75.0 in | Wt 226.3 lb

## 2013-01-29 DIAGNOSIS — I4821 Permanent atrial fibrillation: Secondary | ICD-10-CM

## 2013-01-29 DIAGNOSIS — Z7901 Long term (current) use of anticoagulants: Secondary | ICD-10-CM

## 2013-01-29 DIAGNOSIS — I10 Essential (primary) hypertension: Secondary | ICD-10-CM

## 2013-01-29 DIAGNOSIS — I4891 Unspecified atrial fibrillation: Secondary | ICD-10-CM

## 2013-01-29 DIAGNOSIS — Z5181 Encounter for therapeutic drug level monitoring: Secondary | ICD-10-CM

## 2013-01-29 MED ORDER — METOPROLOL SUCCINATE ER 25 MG PO TB24: 12.5000 mg | ORAL_TABLET | Freq: Every day | ORAL | Status: DC

## 2013-01-29 NOTE — Patient Instructions (Signed)
1.  Decrease Metoprolol XL to 12.5mg  daily.  2.  Follow upo as previously scheduled or sooner if needed.

## 2013-01-29 NOTE — Assessment & Plan Note (Addendum)
Maintaining Afib with a rate of 60.  The EKG from the ER shows some beats space about two seconds apart.  I am going to cut his beta blocker in half to 12.5mg  daily.  I am not sure if this is related to the incident or a combination of bradycardia and orthostasis.   May need a monitor.

## 2013-01-29 NOTE — Progress Notes (Signed)
Date:  01/29/2013   ID:  [name], DOB [birth year], MRN [id number]  PCP:  [name], MD  Primary Cardiologist:  Allyson Sabal    History of Present Illness: [name] is a 72 y.o. male mildly overweight, married Caucasian male father of 2, grandfather of 3 grandchildren who saw  Dr. Allyson Sabal in September. He has a history of CAD status post RCA stenting January 11, 1998. He had normal circumflex, LAD and ejection fraction. His other problems include hypertension and chronic atrial fibrillation on Coumadin anticoagulation, rate controlled, as well as erectile dysfunction.  He did have colon cancer picked up on screening colonoscopy and underwent right colectomy May 29, 2006 followed by 11 cycles of adjuvant Folfox chemotherapy. His last lipid profile performed in January revealed a total cholesterol of 168, LDL of 94 and HDL of 55.  He is very active exercises 6 days a week doing weight training 3 days a week.  The patient presents today after falling last night.  He stood up from his chair was a little dizzy but apparently lost his balance and tripped over the ottoman.  He was seen in the ER with a hematoma of the frontal scalp.  The patient reports that he never lost consciousness.  EKG at [facility] showed Afib with a rate of 60.  The patient reports that he works out vigorously 5-6 days per week the last time which was yesterday with no dizziness.   The patient currently denies nausea, vomiting, fever, chest pain, shortness of breath, orthopnea, dizziness, PND, cough, congestion, abdominal pain, hematochezia, melena, lower extremity edema, claudication.  Wt Readings from Last 3 Encounters:  01/29/13 226 lb 4.8 oz (102.649 kg)  11/20/12 227 lb (102.967 kg)  09/01/12 234 lb 9.6 oz (106.414 kg)     Past Medical History  Diagnosis Date  . CAD (coronary artery disease) 1999    RCA stent  . Allergy     Seasonal--pollen  . Psoriasis   . Colon cancer 04/2006    Stage III--T3 N1    . Hyperlipidemia   . Hypertension   . Neuropathy   . Hand foot syndrome   . COPD (chronic obstructive pulmonary disease)   . Hemorrhoids   . Permanent atrial fibrillation   . Anticoagulation goal of INR 2 to 3     on coumadin  . Carotid stenosis, asymptomatic     moderate RT and mild LT with carotid dopplers 10/26/11  . H/O echocardiogram 05/23/2009    EF >55%, mild MR, Mild TR,   . H/O cardiovascular stress test 01/13/2010    low risk scan, similar to 2008 study  . A-fib     Current Outpatient Prescriptions  Medication Sig Dispense Refill  . aspirin 81 MG chewable tablet Chew 81 mg by mouth daily.      . Cholecalciferol (VITAMIN D3) 2000 UNITS TABS Take 2,000 Units by mouth daily.       . Coenzyme Q10 (COQ10) 400 MG CAPS Take 1 tablet by mouth daily.      . fluticasone (FLONASE) 50 MCG/ACT nasal spray daily.      . Omega-3 Fatty Acids (FISH OIL) 1200 MG CAPS Take 1,000 mg by mouth 2 (two) times daily.       . simvastatin (ZOCOR) 80 MG tablet Take 1 tablet (80 mg total) by mouth at bedtime.  30 tablet  3  . Testosterone (ANDROGEL TD) Place 60 mg onto the skin daily. Axeron      .  vitamin C (ASCORBIC ACID) 500 MG tablet Take 500 mg by mouth daily.      [name] warfarin (COUMADIN) 7.5 MG tablet Take 7.5-15 mg by mouth daily. 1 tablet Sunday, Monday, Wednesday, Friday; 2 tablets Tuesday, Thursday, Saturday      . metoprolol succinate (TOPROL XL) 25 MG 24 hr tablet Take 0.5 tablets (12.5 mg total) by mouth daily.       No current facility-administered medications for this visit.    Allergies:   No Known Allergies  Social History:  The patient  reports that he has never smoked. He has never used smokeless tobacco. He reports that he drinks alcohol. He reports that he does not use illicit drugs.   Family history:   Family History  Problem Relation Age of Onset  . Colon cancer Neg Hx   . Heart disease Father 68  . Heart disease Paternal Grandmother   . Heart disease Mother   .  Hypertension Mother   . Healthy Sister   . Heart disease Paternal Grandfather   . Hypertension Paternal Grandfather     ROS:  Please see the history of present illness.  All other systems reviewed and negative.   PHYSICAL EXAM: VS:  BP 138/90  Pulse 64  Ht 6\' 3"  (1.905 m)  Wt 226 lb 4.8 oz (102.649 kg)  BMI 28.29 kg/m2 Well nourished, well developed, in no acute distress HEENT: Large area of ecchymosis and periorbital  edema around the left eye which is completely closed. Neck: no JVDNo cervical lymphadenopathy. Cardiac: Regular rate and rhythm without murmurs rubs or gallops. Lungs:  clear to auscultation bilaterally, no wheezing, rhonchi or rales Ext: no lower extremity edema.  2+ radial and dorsalis pedis pulses. Skin: warm and dry Neuro:  Grossly normal  EKG:  Afib rate 60.  Almost 2 sec delay seen.      ASSESSMENT AND PLAN:  Problem List Items Addressed This Visit   Permanent atrial fibrillation - Primary     Maintaining Afib with a rate of 60.  The EKG from the ER shows some beats space about two seconds apart.  I am going to cut his beta blocker in half to 12.5mg  daily.  I am not sure if this is related to the incident or a combination of bradycardia and orthostasis.   May need a monitor.    Relevant Medications      metoprolol succinate (TOPROL-XL) 24 hr tablet

## 2013-02-02 ENCOUNTER — Ambulatory Visit: Payer: Medicare Other | Admitting: Pharmacist Clinician (PhC)/ Clinical Pharmacy Specialist

## 2013-03-03 ENCOUNTER — Other Ambulatory Visit: Payer: Self-pay | Admitting: *Deleted

## 2013-03-03 MED ORDER — SIMVASTATIN 80 MG PO TABS: 80.0000 mg | ORAL_TABLET | Freq: Every day | ORAL | Status: DC

## 2013-03-06 ENCOUNTER — Ambulatory Visit (INDEPENDENT_AMBULATORY_CARE_PROVIDER_SITE_OTHER): Payer: Medicare Other | Admitting: Pharmacist Clinician (PhC)/ Clinical Pharmacy Specialist

## 2013-03-06 DIAGNOSIS — I4891 Unspecified atrial fibrillation: Secondary | ICD-10-CM

## 2013-03-06 DIAGNOSIS — Z7901 Long term (current) use of anticoagulants: Secondary | ICD-10-CM

## 2013-03-06 LAB — POCT INR: INR: 1.5

## 2013-04-17 ENCOUNTER — Ambulatory Visit (INDEPENDENT_AMBULATORY_CARE_PROVIDER_SITE_OTHER): Payer: Medicare Other | Admitting: Pharmacist Clinician (PhC)/ Clinical Pharmacy Specialist

## 2013-04-17 DIAGNOSIS — Z7901 Long term (current) use of anticoagulants: Secondary | ICD-10-CM

## 2013-04-17 DIAGNOSIS — I4891 Unspecified atrial fibrillation: Secondary | ICD-10-CM

## 2013-04-17 LAB — POCT INR: INR: 2.4

## 2013-05-08 ENCOUNTER — Encounter: Payer: Self-pay | Admitting: Gastroenterology

## 2013-05-29 ENCOUNTER — Ambulatory Visit (INDEPENDENT_AMBULATORY_CARE_PROVIDER_SITE_OTHER): Payer: Medicare Other | Admitting: Pharmacist Clinician (PhC)/ Clinical Pharmacy Specialist

## 2013-05-29 DIAGNOSIS — I4891 Unspecified atrial fibrillation: Secondary | ICD-10-CM

## 2013-05-29 DIAGNOSIS — Z7901 Long term (current) use of anticoagulants: Secondary | ICD-10-CM

## 2013-05-29 LAB — POCT INR: INR: 1.7

## 2013-07-10 ENCOUNTER — Ambulatory Visit (INDEPENDENT_AMBULATORY_CARE_PROVIDER_SITE_OTHER): Payer: Medicare Other | Admitting: Pharmacist Clinician (PhC)/ Clinical Pharmacy Specialist

## 2013-07-10 DIAGNOSIS — I4891 Unspecified atrial fibrillation: Secondary | ICD-10-CM

## 2013-07-10 DIAGNOSIS — Z7901 Long term (current) use of anticoagulants: Secondary | ICD-10-CM

## 2013-07-10 LAB — POCT INR: INR: 1.9

## 2013-08-11 ENCOUNTER — Encounter: Payer: Self-pay | Admitting: Physician Assistant

## 2013-08-19 ENCOUNTER — Encounter: Payer: Self-pay | Admitting: Physician Assistant

## 2013-08-19 ENCOUNTER — Ambulatory Visit (INDEPENDENT_AMBULATORY_CARE_PROVIDER_SITE_OTHER): Payer: Medicare Other | Admitting: Physician Assistant

## 2013-08-19 ENCOUNTER — Telehealth: Payer: Self-pay | Admitting: *Deleted

## 2013-08-19 VITALS — BP 120/70 | HR 66 | Ht 75.0 in | Wt 230.4 lb

## 2013-08-19 DIAGNOSIS — Z7901 Long term (current) use of anticoagulants: Secondary | ICD-10-CM

## 2013-08-19 DIAGNOSIS — Z85038 Personal history of other malignant neoplasm of large intestine: Secondary | ICD-10-CM

## 2013-08-19 DIAGNOSIS — Z860100 Personal history of colon polyps, unspecified: Secondary | ICD-10-CM

## 2013-08-19 MED ORDER — MOVIPREP 100 G PO SOLR: 1.0000 | ORAL | Status: DC

## 2013-08-19 NOTE — Patient Instructions (Signed)
You have been scheduled for a colonoscopy with propofol. Please follow written instructions given to you at your visit today.  Please pick up your prep kit at the pharmacy within the next 1-3 days. [facility] If you use inhalers (even only as needed), please bring them with you on the day of your procedure. Your physician has requested that you go to www.startemmi.com and enter the access code given to you at your visit today. This web site gives a general overview about your procedure. However, you should still follow specific instructions given to you by our office regarding your preparation for the procedure.  We will contact you once we hear from Dr. [name] regarding the coumadin.

## 2013-08-19 NOTE — Progress Notes (Signed)
Subjective:    Patient ID: [name], male    DOB: 03-25-1940, 73 y.o.   MRN: [id number]  HPI Rakin is very nice 73 year old white male known to Dr. Randell Loop. Patient has history of colon cancer diagnosed in 2008. This was a T3 N1 lesion for which she underwent right hemicolectomy and chemotherapy. He has had serial colonoscopies, last done in April of 2013. He had 2 small polyps removed at that time which were both tubular adenomas. He comes in today to discuss followup colonoscopy. Patient has history of coronary artery disease is status post stent to the RCA in 1999 and also has history of permanent atrial fibrillation hypertension, and hyperlipidemia. He is followed by Dr. [name] and is maintained on Coumadin. He has not had any recent cardiac issues and states he's been feeling very well. Patient has no GI symptoms, specifically no complaints of abdominal pain changes in bowel habits melena or hematochezia. He has had no problems with colonoscopies in the past.     Review of Systems  Constitutional: Negative.   HENT: Negative.   Eyes: Negative.   Respiratory: Negative.   Cardiovascular: Negative.   Gastrointestinal: Negative.   Endocrine: Negative.   Genitourinary: Negative.   Musculoskeletal: Negative.   Skin: Negative.   Allergic/Immunologic: Negative.   Neurological: Negative.   Hematological: Negative.   Psychiatric/Behavioral: Negative.    Outpatient Prescriptions Prior to Visit  Medication Sig Dispense Refill  . metoprolol succinate (TOPROL XL) 25 MG 24 hr tablet Take 0.5 tablets (12.5 mg total) by mouth daily.      . Omega-3 Fatty Acids (FISH OIL) 1200 MG CAPS Take 1,000 mg by mouth 2 (two) times daily.       . simvastatin (ZOCOR) 80 MG tablet Take 1 tablet (80 mg total) by mouth at bedtime.  30 tablet  6  . warfarin (COUMADIN) 7.5 MG tablet Take 7.5-15 mg by mouth daily. 1 tablet Sunday, Monday, Wednesday, Friday; 2 tablets Tuesday, Thursday, Saturday       . aspirin 81 MG chewable tablet Chew 81 mg by mouth daily.      . Cholecalciferol (VITAMIN D3) 2000 UNITS TABS Take 2,000 Units by mouth daily.       . Coenzyme Q10 (COQ10) 400 MG CAPS Take 1 tablet by mouth daily.      . fluticasone (FLONASE) 50 MCG/ACT nasal spray daily.      . Testosterone (ANDROGEL TD) Place 60 mg onto the skin daily. Axeron      . vitamin C (ASCORBIC ACID) 500 MG tablet Take 500 mg by mouth daily.       No facility-administered medications prior to visit.     No Known Allergies Patient Active Problem List   Diagnosis Date Noted  . Hx of adenomatous colonic polyps 08/19/2013  . Essential hypertension 11/20/2012  . Hyperlipidemia 11/20/2012  . Permanent atrial fibrillation   . Anticoagulation goal of INR 2 to 3   . Carotid stenosis, asymptomatic   . CAD (coronary artery disease)   . 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 Smoking status: Never Smoker   . Smokeless tobacco: Never Used  . Alcohol Use: Yes     Comment: 4 glasses of wine daily    family history includes Healthy in his sister; Heart disease in his mother, paternal grandfather, and paternal grandmother; Heart disease (age of onset: 96) in his father; Hypertension in his mother and paternal grandfather.  There is no history of Colon cancer.  Objective:   Physical Exam well-developed older white male in no acute distress, quite pleasant blood pressure 120/70 pulse 66 height 6 foot 3 weight 230. HEENT; nontraumatic normocephalic ,EOMI PERRLA sclera anicteric Supple; no JVD, Cardiovascular; irregular rate and rhythm with S1-S2, Pulmonary clear bilaterally, Abdomen; soft nontender nondistended bowel sounds are active no palpable mass or hepatosplenomegaly, Rectal; exam not done, Extremities; no clubbing cyanosis or edema skin warm and dry, Psych ;mood and affect appropriate        Assessment & Plan:  #23 73 year old male with history of colon  cancer 2008 with T3 N1 lesion status post right hemicolectomy and chemotherapy now due for followup colonoscopy #2 history of adenomatous colon polyps at last colonoscopy April 2013 #3 chronic anticoagulation with Coumadin #4 atrial fibrillation #5 coronary artery disease status post stent 1999 #6 hypertension #7 hyperlipidemia  Plan; Patient will be scheduled for colonoscopy with Dr. Kennedy Bucker. Procedure discussed in detail with the patient and he is agreeable to proceed. Patient will need to hold Coumadin for 5 days prior to procedure and will obtain consent from his cardiologist Dr. Gwenlyn Found. Patient mentions that he may be bridged with Eliquis  We will defer that to cardiology.

## 2013-08-19 NOTE — Progress Notes (Signed)
Reviewed and agree with management plan.  Lanetra Hartley T. Erastus Bartolomei, MD FACG 

## 2013-08-19 NOTE — Telephone Encounter (Signed)
08/19/2013   RE: [name] DOB: [birth year] MRN: [id number]   Dear Dr. [name],    We have scheduled the above patient for an endoscopic procedure. Our records show that he is on anticoagulation therapy.   Please advise as to how long the patient may come off his therapy of Coumadin prior to the procedure, which is scheduled for 09-10-2013.  Please fax back/ or route the completed form to [facility] at [phone].   Sincerely,    Amy Esterwood PA-C

## 2013-08-21 ENCOUNTER — Ambulatory Visit (INDEPENDENT_AMBULATORY_CARE_PROVIDER_SITE_OTHER): Payer: Medicare Other | Admitting: Pharmacist Clinician (PhC)/ Clinical Pharmacy Specialist

## 2013-08-21 DIAGNOSIS — I4891 Unspecified atrial fibrillation: Secondary | ICD-10-CM

## 2013-08-21 DIAGNOSIS — Z7901 Long term (current) use of anticoagulants: Secondary | ICD-10-CM

## 2013-08-21 LAB — POCT INR: INR: 1.5

## 2013-08-22 NOTE — Telephone Encounter (Signed)
Will need lovenox bridging (CAF)

## 2013-08-24 ENCOUNTER — Telehealth: Payer: Self-pay | Admitting: Cardiovascular Disease

## 2013-08-24 NOTE — Telephone Encounter (Signed)
I explained to Mariann Laster at [facility],  that the patient is having a colonoscopy on 09-10-2013 and Dr. Gwenlyn Found says the patient will need Lovenox bridging.   Mariann Laster said she will give this to their clinical pharmacist at the coumadin clinic and they will call the patient.

## 2013-08-24 NOTE — Telephone Encounter (Signed)
Dr Berry, please advise 

## 2013-08-24 NOTE — Telephone Encounter (Signed)
Nurse from [facility] called to ask if our office would be handling the patient's cross-over for his colonoscopy that is scheduled on June 25th. Informed her that I will check with our clinical pharmacist, Cyril Mourning to see if she is aware the patient has been recommended for this. After speaking with Cyril Mourning, she is aware and has been in contact with patient. She also needs to discuss his treatment further with Dr. Gwenlyn Found.

## 2013-08-24 NOTE — Telephone Encounter (Signed)
Needs Lovenox bridging arranged with Cyril Mourning

## 2013-08-24 NOTE — Telephone Encounter (Signed)
Dr Gwenlyn Found  Mr. Delfin and I have already discussed his colonoscopy and bridging.  He is refusing to use lovenox, apparently he didn't use it last time we had to hold warfarin either.  Would you consider bridging him with Eliquis?  I have bridged others in the past with Pradaxa, stopping it 36 hrs prior to procedure and then overlapping with warfarin for 3-4 days post procedure.  I asked him about doing a tablet rather than injections and he was not opposed.  [name]

## 2013-08-31 NOTE — Telephone Encounter (Signed)
I spoke to [facility]  On 6-8 and I was told they will give this  Information to the clinical pharmacist and they will contact the patient.  See Dr. Kennon Holter note below, dated 08-24-2013.

## 2013-09-01 ENCOUNTER — Other Ambulatory Visit (HOSPITAL_BASED_OUTPATIENT_CLINIC_OR_DEPARTMENT_OTHER): Payer: Medicare Other

## 2013-09-01 ENCOUNTER — Ambulatory Visit (HOSPITAL_BASED_OUTPATIENT_CLINIC_OR_DEPARTMENT_OTHER): Payer: Medicare Other | Admitting: Oncology

## 2013-09-01 VITALS — BP 131/65 | HR 56 | Temp 98.3°F | Resp 18 | Ht 75.0 in | Wt 234.5 lb

## 2013-09-01 DIAGNOSIS — Z85038 Personal history of other malignant neoplasm of large intestine: Secondary | ICD-10-CM

## 2013-09-01 NOTE — Progress Notes (Signed)
  [facility] OFFICE PROGRESS NOTE   Diagnosis: Colon cancer  INTERVAL HISTORY:   Mr. Eisler returns as scheduled. He feels well. He is exercising and traveling. The neuropathy symptoms are much improved in the hands. He continues to have numbness of the feet. He is scheduled for colonoscopy 09/10/2013.  Objective:  Vital signs in last 24 hours:  Blood pressure 131/65, pulse 56, temperature 98.3 F (36.8 C), temperature source Oral, resp. rate 18, height 6\' 3"  (1.905 m), weight 234 lb 8 oz (106.369 kg), SpO2 100.00%.    HEENT: Neck without mass Lymphatics: No cervical, supraclavicular, axillary, or inguinal nodes Resp: Inspiratory rhonchi at the right posterior base, no respiratory distress Cardio: Irregular GI: No hepatomegaly, nontender, no mass Vascular: No leg edema   Lab Results:   Lab Results  Component Value Date   CEA 0.7 08/30/2011   CEA pending today   Medications: I have reviewed the patient's current medications.  Assessment/Plan: 1. Stage III colon cancer, status post a right colectomy May 29, 2006, followed by 11 cycles of adjuvant FOLFOX chemotherapy, with the last treatment given November 28, 2006. A restaging CT May 26, 2009, reveals no evidence for metastatic disease. He underwent a colonoscopy 07/09/2011. Polyps were removed from the ascending and transverse colon-tubular adenomas 2. History of "tearing" secondary to 5-FU chemotherapy - resolved.  3. Hand/foot syndrome secondary to 5-FU - resolved. 4. Oxaliplatin neuropathy - improved 5. History of pneumonia September 2008. 6. Atrial fibrillation, maintained on Coumadin and followed by Dr Gwenlyn Found.  7. Status post Port-A-Cath removal July 16, 2007.       8.   History of Mild elevation of the bilirubin and transaminases -the liver enzymes were normal on 01/28/2013        9.   New tiny nodule in the right lung -seen on a CT of the chest in March of 2011, likely a benign finding. I reviewed  the repeat CT of the chest from November of 2011 with a [facility] radiologist. The lung nodule was not present on the November 2011 study.    Disposition:  Mr. Coberly remains in clinical remission from colon cancer. We will followup on the CEA from today. He will continue colonoscopy followup with Dr. Fuller Plan.  Mr. Stafford was discharged from the Medical Oncology clinic. He will continue clinical followup with Dr. [name]. We will see him in the future as needed. He has a good prognosis for a long-term disease-free survival.  Betsy Coder, MD  09/01/2013  12:25 PM

## 2013-09-02 ENCOUNTER — Telehealth: Payer: Self-pay | Admitting: *Deleted

## 2013-09-02 ENCOUNTER — Encounter: Payer: Self-pay | Admitting: Gastroenterology

## 2013-09-02 LAB — CEA: CEA: 1.3 ng/mL (ref 0.0–5.0)

## 2013-09-02 NOTE — Telephone Encounter (Signed)
Per Dr. Benay Spice; LM for pt that cea is normal and if any questions to call office.

## 2013-09-02 NOTE — Telephone Encounter (Signed)
Message copied by Domenic Schwab on Wed Sep 02, 2013  4:57 PM ------      Message from: Betsy Coder B      Created: Wed Sep 02, 2013  8:52 AM       Please call patient, cea is normal ------

## 2013-09-10 ENCOUNTER — Ambulatory Visit (AMBULATORY_SURGERY_CENTER): Payer: Medicare Other | Admitting: Gastroenterology

## 2013-09-10 ENCOUNTER — Encounter: Payer: Self-pay | Admitting: Gastroenterology

## 2013-09-10 VITALS — BP 116/64 | HR 63 | Temp 96.9°F | Resp 25 | Ht 75.0 in | Wt 230.0 lb

## 2013-09-10 DIAGNOSIS — Z85038 Personal history of other malignant neoplasm of large intestine: Secondary | ICD-10-CM

## 2013-09-10 DIAGNOSIS — D126 Benign neoplasm of colon, unspecified: Secondary | ICD-10-CM

## 2013-09-10 MED ORDER — SODIUM CHLORIDE 0.9 % IV SOLN: 500.0000 mL | INTRAVENOUS | Status: DC

## 2013-09-10 NOTE — Progress Notes (Signed)
Called to room to assist during endoscopic procedure.  Patient ID and intended procedure confirmed with present staff. Received instructions for my participation in the procedure from the performing physician.  

## 2013-09-10 NOTE — Patient Instructions (Signed)
YOU HAD AN ENDOSCOPIC PROCEDURE TODAY AT THE [facility]: Refer to the procedure report that was given to you for any specific questions about what was found during the examination.  If the procedure report does not answer your questions, please call your gastroenterologist to clarify.  If you requested that your care partner not be given the details of your procedure findings, then the procedure report has been included in a sealed envelope for you to review at your convenience later.  YOU SHOULD EXPECT: Some feelings of bloating in the abdomen. Passage of more gas than usual.  Walking can help get rid of the air that was put into your GI tract during the procedure and reduce the bloating. If you had a lower endoscopy (such as a colonoscopy or flexible sigmoidoscopy) you may notice spotting of blood in your stool or on the toilet paper. If you underwent a bowel prep for your procedure, then you may not have a normal bowel movement for a few days.  DIET: Your first meal following the procedure should be a light meal and then it is ok to progress to your normal diet.  A half-sandwich or bowl of soup is an example of a good first meal.  Heavy or fried foods are harder to digest and may make you feel nauseous or bloated.  Likewise meals heavy in dairy and vegetables can cause extra gas to form and this can also increase the bloating.  Drink plenty of fluids but you should avoid alcoholic beverages for 24 hours.  ACTIVITY: Your care partner should take you home directly after the procedure.  You should plan to take it easy, moving slowly for the rest of the day.  You can resume normal activity the day after the procedure however you should NOT DRIVE or use heavy machinery for 24 hours (because of the sedation medicines used during the test).    SYMPTOMS TO REPORT IMMEDIATELY: A gastroenterologist can be reached at any hour.  During normal business hours, 8:30 AM to 5:00 PM Monday through Friday,  call [phone].  After hours and on weekends, please call the GI answering service at [phone] who will take a message and have the physician on call contact you.   Following lower endoscopy (colonoscopy or flexible sigmoidoscopy):  Excessive amounts of blood in the stool  Significant tenderness or worsening of abdominal pains  Swelling of the abdomen that is new, acute  Fever of 100F or higher   FOLLOW UP: If any biopsies were taken you will be contacted by phone or by letter within the next 1-3 weeks.  Call your gastroenterologist if you have not heard about the biopsies in 3 weeks.  Our staff will call the home number listed on your records the next business day following your procedure to check on you and address any questions or concerns that you may have at that time regarding the information given to you following your procedure. This is a courtesy call and so if there is no answer at the home number and we have not heard from you through the emergency physician on call, we will assume that you have returned to your regular daily activities without incident.  SIGNATURES/CONFIDENTIALITY: You and/or your care partner have signed paperwork which will be entered into your electronic medical record.  These signatures attest to the fact that that the information above on your After Visit Summary has been reviewed and is understood.  Full responsibility of the confidentiality of   this discharge information lies with you and/or your care-partner.    Handouts were given to your care partner on polyps, a high fiber diet with liberal fluid intake and hemorrhoids. Hold aspirin, aspirin products, and anti-inflammatory medications for 2 weeks.  Resume coumadin and eliquis as directed by Dr. Gwenlyn Found and have your PT/INR checked within 1 week.  You may resume your other current medications today. Await biopsy results. Please call if any questions or concerns.

## 2013-09-10 NOTE — Progress Notes (Signed)
A/ox3 pleased with MAC, report to Annette RN 

## 2013-09-10 NOTE — Op Note (Signed)
[place]  [address] [place], [address]   COLONOSCOPY PROCEDURE REPORT PATIENT: [name], Keller  MR#: [id number] BIRTHDATE: 1940/07/16 , 19  yrs. old GENDER: Male ENDOSCOPIST: Ladene Artist, MD, [facility] PROCEDURE DATE:  09/10/2013 PROCEDURE:   Colonoscopy with biopsy and snare polypectomy First Screening Colonoscopy - Avg.  risk and is 50 yrs.  old or older - No.  Prior Negative Screening - Now for repeat screening. N/A  History of Adenoma - Now for follow-up colonoscopy & has been > or = to 3 yrs.  N/A  Polyps Removed Today? Yes. ASA CLASS:   Class III INDICATIONS:High risk patient with personal history of colon cancer.  MEDICATIONS: MAC sedation, administered by CRNA and propofol (Diprivan) 200mg  IV DESCRIPTION OF PROCEDURE:   After the risks benefits and alternatives of the procedure were thoroughly explained, informed consent was obtained.  A digital rectal exam revealed no abnormalities of the rectum.   The [id number]  endoscope was introduced through the anus and advanced to the terminal ileum which was intubated for a short distance. No adverse events experienced.   The quality of the prep was adequate, using MoviPrep The instrument was then slowly withdrawn as the colon was fully examined.  COLON FINDINGS: A sessile polyp measuring 6 mm in size was found in the transverse colon.  A polypectomy was performed with a cold snare.  The resection was complete and the polyp tissue was completely retrieved.  A sessile polyp measuring 4 mm in size was found in the sigmoid colon. A polypectomy was performed with cold forceps. The resection was complete and the polyp tissue was completely retrieved. A sessile polyp measuring 5 mm in size was found in the rectum. A polypectomy was performed with a cold snare. The resection was complete and the polyp tissue was completely retrieved. The colon was otherwise normal.  There was no diverticulosis,  inflammation, polyps or cancers unless previously stated.  Retroflexed views revealed small internal hemorrhoids. The time to cecum=2 minutes 02 seconds. Withdrawal time=13 minutes 52 seconds. The scope was withdrawn and the procedure completed. COMPLICATIONS: There were no complications. ENDOSCOPIC IMPRESSION: 1.   Sessile polyp measuring 6 mm in the transverse colon; polypectomy performed with a cold snare 2.   Sessile polyp measuring 4 mm in the sigmoid colon; polypectomy performed with cold forceps 3.   Sessile polyp measuring 5 mm in the rectum; polypectomy performed with a cold snare 4.   Small internal hemorrhoids RECOMMENDATIONS: 1.  Hold aspirin, aspirin products, and anti-inflammatory medication for 2 weeks. 2.  Await pathology results 3.  Repeat Colonoscopy in 3 years. 4.  Resume Coumadin and Eliquis as directed by Dr. Gwenlyn Found and have your PT/INR checked within 1 week. eSigned:  Ladene Artist, MD, [facility] 09/10/2013 10:52 AM   cc: Lavone Orn, MD

## 2013-09-10 NOTE — Progress Notes (Signed)
No complaints noted in the recovery room. Maw   

## 2013-09-11 ENCOUNTER — Telehealth: Payer: Self-pay

## 2013-09-11 NOTE — Telephone Encounter (Signed)
No answer, left vm 

## 2013-09-20 ENCOUNTER — Encounter: Payer: Self-pay | Admitting: Gastroenterology

## 2013-10-07 ENCOUNTER — Ambulatory Visit: Payer: Medicare Other | Admitting: Pharmacist Clinician (PhC)/ Clinical Pharmacy Specialist

## 2013-10-14 ENCOUNTER — Ambulatory Visit (INDEPENDENT_AMBULATORY_CARE_PROVIDER_SITE_OTHER): Payer: Medicare Other | Admitting: Pharmacist Clinician (PhC)/ Clinical Pharmacy Specialist

## 2013-10-14 DIAGNOSIS — I4891 Unspecified atrial fibrillation: Secondary | ICD-10-CM

## 2013-10-14 DIAGNOSIS — Z7901 Long term (current) use of anticoagulants: Secondary | ICD-10-CM

## 2013-10-14 LAB — POCT INR: INR: 1.6

## 2013-11-10 ENCOUNTER — Telehealth (HOSPITAL_COMMUNITY): Payer: Self-pay | Admitting: *Deleted

## 2013-11-16 ENCOUNTER — Other Ambulatory Visit (HOSPITAL_COMMUNITY): Payer: Self-pay | Admitting: Cardiovascular Disease

## 2013-11-16 DIAGNOSIS — I6529 Occlusion and stenosis of unspecified carotid artery: Secondary | ICD-10-CM

## 2013-11-20 ENCOUNTER — Ambulatory Visit (HOSPITAL_COMMUNITY)
Admission: RE | Admit: 2013-11-20 | Discharge: 2013-11-20 | Disposition: A | Discharge status: Home / Self Care | Payer: Medicare Other | Source: Ambulatory Visit | Attending: Cardiovascular Disease | Admitting: Cardiovascular Disease

## 2013-11-20 DIAGNOSIS — I6529 Occlusion and stenosis of unspecified carotid artery: Secondary | ICD-10-CM | POA: Insufficient documentation

## 2013-11-20 NOTE — Progress Notes (Signed)
Carotid Duplex Completed. °Brianna L Mazza,RVT °

## 2013-11-25 ENCOUNTER — Encounter: Payer: Self-pay | Admitting: Cardiovascular Disease

## 2013-11-25 ENCOUNTER — Ambulatory Visit (INDEPENDENT_AMBULATORY_CARE_PROVIDER_SITE_OTHER): Payer: Medicare Other | Admitting: Cardiovascular Disease

## 2013-11-25 ENCOUNTER — Ambulatory Visit (INDEPENDENT_AMBULATORY_CARE_PROVIDER_SITE_OTHER): Payer: Medicare Other | Admitting: Pharmacist Clinician (PhC)/ Clinical Pharmacy Specialist

## 2013-11-25 VITALS — BP 110/68 | HR 65 | Ht 75.0 in | Wt 226.1 lb

## 2013-11-25 DIAGNOSIS — I6529 Occlusion and stenosis of unspecified carotid artery: Secondary | ICD-10-CM

## 2013-11-25 DIAGNOSIS — I658 Occlusion and stenosis of other precerebral arteries: Secondary | ICD-10-CM

## 2013-11-25 DIAGNOSIS — Z7901 Long term (current) use of anticoagulants: Secondary | ICD-10-CM

## 2013-11-25 DIAGNOSIS — I4821 Permanent atrial fibrillation: Secondary | ICD-10-CM

## 2013-11-25 DIAGNOSIS — I4891 Unspecified atrial fibrillation: Secondary | ICD-10-CM

## 2013-11-25 DIAGNOSIS — E785 Hyperlipidemia, unspecified: Secondary | ICD-10-CM

## 2013-11-25 DIAGNOSIS — I10 Essential (primary) hypertension: Secondary | ICD-10-CM

## 2013-11-25 DIAGNOSIS — I2510 Atherosclerotic heart disease of native coronary artery without angina pectoris: Secondary | ICD-10-CM

## 2013-11-25 DIAGNOSIS — I6523 Occlusion and stenosis of bilateral carotid arteries: Secondary | ICD-10-CM

## 2013-11-25 LAB — POCT INR: INR: 1.6

## 2013-11-25 NOTE — Assessment & Plan Note (Signed)
Chronic atrial fibrillation rate controlled on Coumadin anticoagulation. We'll follow his INR here in our clinic

## 2013-11-25 NOTE — Assessment & Plan Note (Signed)
Controlled on current medications 

## 2013-11-25 NOTE — Progress Notes (Signed)
11/25/2013 [name]   1940-04-25  [id number]  Primary Physician Irven Shelling, MD Primary Cardiologist: Lorretta Harp MD Renae Gloss    HPI:  The patient is a very pleasant 73 year old, mildly overweight, married Caucasian male father of 2, grandfather of 3 grandchildren who I saw 12 months ago. He has a history of CAD status post RCA stenting by myself January 11, 1998. He had normal circumflex, LAD and ejection fraction. His other problems include hypertension and chronic atrial fibrillation on Coumadin anticoagulation, rate controlled, as well as erectile dysfunction. He is totally asymptomatic. He did have colon cancer picked up on screening colonoscopy and underwent right colectomy May 29, 2006 followed by 11 cycles of adjuvant Folfox chemotherapy. His last lipid profile performed in January 2014  revealed a total cholesterol of 168, LDL of 94 and HDL of 55. [name] He is very active exercises 6 days a week doing weight training 3 days a week.he is completely asymptomatic.   Current Outpatient Prescriptions  Medication Sig Dispense Refill  . Alpha Lipoic Acid 200 MG CAPS Take 200 mg by mouth 3 (three) times daily.      [name] aspirin 81 MG tablet Take 81 mg by mouth daily.      . cholecalciferol (VITAMIN D) 1000 UNITS tablet Take 1,000 Units by mouth daily.      . Coenzyme Q10 200 MG capsule Take 200 mg by mouth daily.      . Cyanocobalamin (VITAMIN B 12 PO) Take by mouth.      . metoprolol succinate (TOPROL XL) 25 MG 24 hr tablet Take 0.5 tablets (12.5 mg total) by mouth daily.      . NONFORMULARY OR COMPOUNDED ITEM Lipoderm cream 10%, Place 30mg  onto skin daily.      . Omega-3 Fatty Acids (FISH OIL) 1200 MG CAPS Take 1,000 mg by mouth 2 (two) times daily.       . simvastatin (ZOCOR) 80 MG tablet Take 1 tablet (80 mg total) by mouth at bedtime.  30 tablet  6  . warfarin (COUMADIN) 7.5 MG tablet Take 7.5-15 mg by mouth daily. 1 tablet Sunday, Monday, Wednesday,  Friday; 2 tablets Tuesday, Thursday, Saturday       No current facility-administered medications for this visit.    No Known Allergies  History   Social History  . Marital Status: Married    Spouse Name: [name]    Number of Children: 2  . Years of Education: N/A   Occupational History  . retired    Social History Main Topics  . Smoking status: Former Smoker -- 30 years    Quit date: 11/25/1993  . Smokeless tobacco: Never Used  . Alcohol Use: Yes     Comment: social drinking  . Drug Use: No  . Sexual Activity: Not on file   Other Topics Concern  . Not on file   Social History Narrative  . No narrative on file     Review of Systems: General: negative for chills, fever, night sweats or weight changes.  Cardiovascular: negative for chest pain, dyspnea on exertion, edema, orthopnea, palpitations, paroxysmal nocturnal dyspnea or shortness of breath Dermatological: negative for rash Respiratory: negative for cough or wheezing Urologic: negative for hematuria Abdominal: negative for nausea, vomiting, diarrhea, bright red blood per rectum, melena, or hematemesis Neurologic: negative for visual changes, syncope, or dizziness All other systems reviewed and are otherwise negative except as noted above.    Blood pressure 110/68, pulse 65, height  6\' 3"  (1.905 m), weight 226 lb 1.6 oz (102.558 kg).  General appearance: alert and no distress Neck: no adenopathy, no carotid bruit, no JVD, supple, symmetrical, trachea midline and thyroid not enlarged, symmetric, no tenderness/mass/nodules Lungs: clear to auscultation bilaterally Heart: regular rate and rhythm, S1, S2 normal, no murmur, click, rub or gallop Extremities: extremities normal, atraumatic, no cyanosis or edema  EKG normal sinus rhythm at 65 without ST or T wave changes  ASSESSMENT AND PLAN:   Permanent atrial fibrillation Chronic atrial fibrillation rate controlled on Coumadin anticoagulation. We'll follow his  INR here in our clinic  Carotid stenosis, asymptomatic Moderate right ICA stenosis followed by duplex ultrasound annual basis. He was neurologically asymptomatic  CAD (coronary artery disease) History of CAD status post RCA stenting by myself 10/36/1999. He had normal LAD and circumflex coronary arteries and normal LV function. His last Myoview stress test performed 01/13/10 was nonischemic. He denies chest pain or shortness of breath.  Essential hypertension Controlled on current medications  Hyperlipidemia On statin therapy followed by his PCP      Lorretta Harp MD [facility], [facility] 11/25/2013 10:03 AM

## 2013-11-25 NOTE — Assessment & Plan Note (Signed)
History of CAD status post RCA stenting by myself 10/36/1999. He had normal LAD and circumflex coronary arteries and normal LV function. His last Myoview stress test performed 01/13/10 was nonischemic. He denies chest pain or shortness of breath.

## 2013-11-25 NOTE — Patient Instructions (Addendum)
Your physician wants you to follow-up in: 1 year. You will receive a reminder letter in the mail two months in advance. If you don't receive a letter, please call our office to schedule the follow-up appointment.  Please have a carotid doppler study in 1 year - before your next appointment with Dr. Gwenlyn Found

## 2013-11-25 NOTE — Assessment & Plan Note (Signed)
Moderate right ICA stenosis followed by duplex ultrasound annual basis. He was neurologically asymptomatic

## 2013-11-25 NOTE — Assessment & Plan Note (Signed)
On statin therapy followed by his PCP 

## 2014-01-06 ENCOUNTER — Ambulatory Visit (INDEPENDENT_AMBULATORY_CARE_PROVIDER_SITE_OTHER): Payer: Medicare Other | Admitting: Pharmacist Clinician (PhC)/ Clinical Pharmacy Specialist

## 2014-01-06 DIAGNOSIS — Z7901 Long term (current) use of anticoagulants: Secondary | ICD-10-CM

## 2014-01-06 DIAGNOSIS — I4891 Unspecified atrial fibrillation: Secondary | ICD-10-CM

## 2014-01-06 LAB — POCT INR: INR: 3.7

## 2014-01-29 ENCOUNTER — Ambulatory Visit (INDEPENDENT_AMBULATORY_CARE_PROVIDER_SITE_OTHER): Payer: Medicare Other | Admitting: Pharmacist Clinician (PhC)/ Clinical Pharmacy Specialist

## 2014-01-29 DIAGNOSIS — Z7901 Long term (current) use of anticoagulants: Secondary | ICD-10-CM

## 2014-01-29 DIAGNOSIS — I4891 Unspecified atrial fibrillation: Secondary | ICD-10-CM

## 2014-01-29 LAB — POCT INR: INR: 2.2

## 2014-02-01 ENCOUNTER — Other Ambulatory Visit: Payer: Self-pay | Admitting: Pharmacist Clinician (PhC)/ Clinical Pharmacy Specialist

## 2014-03-15 ENCOUNTER — Ambulatory Visit (INDEPENDENT_AMBULATORY_CARE_PROVIDER_SITE_OTHER): Payer: Medicare Other | Admitting: Pharmacist Clinician (PhC)/ Clinical Pharmacy Specialist

## 2014-03-15 DIAGNOSIS — Z7901 Long term (current) use of anticoagulants: Secondary | ICD-10-CM

## 2014-03-15 DIAGNOSIS — I4891 Unspecified atrial fibrillation: Secondary | ICD-10-CM

## 2014-03-15 LAB — POCT INR: INR: 4.5

## 2014-03-27 ENCOUNTER — Other Ambulatory Visit: Payer: Self-pay | Admitting: Cardiovascular Disease

## 2014-03-29 NOTE — Telephone Encounter (Signed)
Rx refill sent to patient pharmacy   

## 2014-04-26 ENCOUNTER — Ambulatory Visit (INDEPENDENT_AMBULATORY_CARE_PROVIDER_SITE_OTHER): Payer: Medicare Other | Admitting: Pharmacist Clinician (PhC)/ Clinical Pharmacy Specialist

## 2014-04-26 DIAGNOSIS — I4891 Unspecified atrial fibrillation: Secondary | ICD-10-CM

## 2014-04-26 DIAGNOSIS — Z7901 Long term (current) use of anticoagulants: Secondary | ICD-10-CM

## 2014-04-26 LAB — POCT INR: INR: 4.2

## 2014-05-05 ENCOUNTER — Other Ambulatory Visit: Payer: Self-pay | Admitting: Cardiovascular Disease

## 2014-05-05 NOTE — Telephone Encounter (Signed)
Rx has been sent to the pharmacy electronically. ° °

## 2014-05-24 ENCOUNTER — Ambulatory Visit (INDEPENDENT_AMBULATORY_CARE_PROVIDER_SITE_OTHER): Payer: Medicare Other | Admitting: Pharmacist Clinician (PhC)/ Clinical Pharmacy Specialist

## 2014-05-24 DIAGNOSIS — Z7901 Long term (current) use of anticoagulants: Secondary | ICD-10-CM

## 2014-05-24 DIAGNOSIS — I4891 Unspecified atrial fibrillation: Secondary | ICD-10-CM

## 2014-05-24 LAB — POCT INR: INR: 7.9

## 2014-05-25 LAB — PROTIME-INR
INR: 4.77 — ABNORMAL HIGH (ref <1.50)
PROTHROMBIN TIME: 44.7 s — AB (ref 11.6–15.2)

## 2014-06-11 ENCOUNTER — Ambulatory Visit (INDEPENDENT_AMBULATORY_CARE_PROVIDER_SITE_OTHER): Payer: Medicare Other | Admitting: Pharmacist Clinician (PhC)/ Clinical Pharmacy Specialist

## 2014-06-11 DIAGNOSIS — Z7901 Long term (current) use of anticoagulants: Secondary | ICD-10-CM | POA: Diagnosis not present

## 2014-06-11 DIAGNOSIS — I4891 Unspecified atrial fibrillation: Secondary | ICD-10-CM | POA: Diagnosis not present

## 2014-06-11 LAB — PROTIME-INR
INR: 6.16 — AB (ref <1.50)
Prothrombin Time: 54.6 seconds — ABNORMAL HIGH (ref 11.6–15.2)

## 2014-06-11 LAB — HEPATIC FUNCTION PANEL
ALK PHOS: 61 U/L (ref 39–117)
ALT: 53 U/L (ref 0–53)
AST: 34 U/L (ref 0–37)
Albumin: 4.6 g/dL (ref 3.5–5.2)
BILIRUBIN INDIRECT: 1.4 mg/dL — AB (ref 0.2–1.2)
Bilirubin, Direct: 0.3 mg/dL (ref 0.0–0.3)
Total Bilirubin: 1.7 mg/dL — ABNORMAL HIGH (ref 0.2–1.2)
Total Protein: 6.7 g/dL (ref 6.0–8.3)

## 2014-06-12 ENCOUNTER — Telehealth: Payer: Self-pay | Admitting: Physician Assistant

## 2014-06-12 NOTE — Telephone Encounter (Signed)
]     I spoke with [name] about his supratheraputic INR (~6.16). He has had no bleeding and is feeling quite well. Will hold coumadin and call office on Monday to make follow up appointment. Encouraged eating leafy green vegetables. He knows to come in if he has any s/s bleeding.   Angelena Form PA-C  [facility]

## 2014-06-14 ENCOUNTER — Ambulatory Visit (INDEPENDENT_AMBULATORY_CARE_PROVIDER_SITE_OTHER): Payer: Medicare Other

## 2014-06-14 DIAGNOSIS — Z7901 Long term (current) use of anticoagulants: Secondary | ICD-10-CM | POA: Diagnosis not present

## 2014-06-14 DIAGNOSIS — I4891 Unspecified atrial fibrillation: Secondary | ICD-10-CM | POA: Diagnosis not present

## 2014-06-14 LAB — POCT INR: INR: 1.4

## 2014-06-17 ENCOUNTER — Telehealth: Payer: Self-pay | Admitting: *Deleted

## 2014-06-17 NOTE — Telephone Encounter (Signed)
Pt called in. Noted palm sized bruise today on top of thigh. He first noticed in shower this AM. Notes he mowed lawn yesterday. He denies soreness.  Recent changes w/ anticoagulation therapy - had elevated INR 1 week ago, was d/c'ed temporarily from coumadin then rechecked & re-dosed the other day.  INR check sched 4/4.  Advised pt if it gets bigger or if he has change in concerns to call back - can see for INR sooner. Will route to [place] as FYI. Note she has full schedule tomorrow, will see if work-in manageable (if needed).

## 2014-06-21 ENCOUNTER — Ambulatory Visit (INDEPENDENT_AMBULATORY_CARE_PROVIDER_SITE_OTHER): Payer: Medicare Other | Admitting: Pharmacist Clinician (PhC)/ Clinical Pharmacy Specialist

## 2014-06-21 DIAGNOSIS — Z7901 Long term (current) use of anticoagulants: Secondary | ICD-10-CM | POA: Diagnosis not present

## 2014-06-21 DIAGNOSIS — I4891 Unspecified atrial fibrillation: Secondary | ICD-10-CM | POA: Diagnosis not present

## 2014-06-21 LAB — POCT INR: INR: 2.8

## 2014-07-01 ENCOUNTER — Ambulatory Visit (INDEPENDENT_AMBULATORY_CARE_PROVIDER_SITE_OTHER): Payer: Medicare Other | Admitting: Pharmacist Clinician (PhC)/ Clinical Pharmacy Specialist

## 2014-07-01 DIAGNOSIS — I4891 Unspecified atrial fibrillation: Secondary | ICD-10-CM | POA: Diagnosis not present

## 2014-07-01 DIAGNOSIS — Z7901 Long term (current) use of anticoagulants: Secondary | ICD-10-CM

## 2014-07-01 LAB — POCT INR: INR: 8

## 2014-07-12 ENCOUNTER — Ambulatory Visit (INDEPENDENT_AMBULATORY_CARE_PROVIDER_SITE_OTHER): Payer: Medicare Other | Admitting: Pharmacist Clinician (PhC)/ Clinical Pharmacy Specialist

## 2014-07-12 DIAGNOSIS — Z7901 Long term (current) use of anticoagulants: Secondary | ICD-10-CM | POA: Diagnosis not present

## 2014-07-12 DIAGNOSIS — I4891 Unspecified atrial fibrillation: Secondary | ICD-10-CM | POA: Diagnosis not present

## 2014-07-12 LAB — POCT INR: INR: 3.2

## 2014-07-26 ENCOUNTER — Ambulatory Visit (INDEPENDENT_AMBULATORY_CARE_PROVIDER_SITE_OTHER): Payer: Medicare Other | Admitting: Pharmacist Clinician (PhC)/ Clinical Pharmacy Specialist

## 2014-07-26 DIAGNOSIS — I4891 Unspecified atrial fibrillation: Secondary | ICD-10-CM | POA: Diagnosis not present

## 2014-07-26 DIAGNOSIS — Z7901 Long term (current) use of anticoagulants: Secondary | ICD-10-CM

## 2014-07-26 LAB — POCT INR: INR: 4.6

## 2014-08-11 ENCOUNTER — Ambulatory Visit (INDEPENDENT_AMBULATORY_CARE_PROVIDER_SITE_OTHER): Payer: Medicare Other | Admitting: Pharmacist Clinician (PhC)/ Clinical Pharmacy Specialist

## 2014-08-11 DIAGNOSIS — Z7901 Long term (current) use of anticoagulants: Secondary | ICD-10-CM | POA: Diagnosis not present

## 2014-08-11 DIAGNOSIS — I4891 Unspecified atrial fibrillation: Secondary | ICD-10-CM

## 2014-08-11 LAB — POCT INR: INR: 4.4

## 2014-08-13 ENCOUNTER — Encounter: Payer: Self-pay | Admitting: Gastroenterology

## 2014-08-25 ENCOUNTER — Ambulatory Visit (INDEPENDENT_AMBULATORY_CARE_PROVIDER_SITE_OTHER): Payer: Medicare Other | Admitting: Pharmacist Clinician (PhC)/ Clinical Pharmacy Specialist

## 2014-08-25 DIAGNOSIS — Z7901 Long term (current) use of anticoagulants: Secondary | ICD-10-CM | POA: Diagnosis not present

## 2014-08-25 DIAGNOSIS — I4891 Unspecified atrial fibrillation: Secondary | ICD-10-CM

## 2014-08-25 LAB — POCT INR: INR: 3.2

## 2014-09-15 ENCOUNTER — Ambulatory Visit (INDEPENDENT_AMBULATORY_CARE_PROVIDER_SITE_OTHER): Payer: Medicare Other | Admitting: Pharmacist Clinician (PhC)/ Clinical Pharmacy Specialist

## 2014-09-15 DIAGNOSIS — Z7901 Long term (current) use of anticoagulants: Secondary | ICD-10-CM

## 2014-09-15 DIAGNOSIS — I4891 Unspecified atrial fibrillation: Secondary | ICD-10-CM | POA: Diagnosis not present

## 2014-09-15 LAB — POCT INR: INR: 2.3

## 2014-09-27 ENCOUNTER — Other Ambulatory Visit: Payer: Self-pay | Admitting: Cardiovascular Disease

## 2014-10-06 ENCOUNTER — Ambulatory Visit (INDEPENDENT_AMBULATORY_CARE_PROVIDER_SITE_OTHER): Payer: Medicare Other | Admitting: Pharmacist Clinician (PhC)/ Clinical Pharmacy Specialist

## 2014-10-06 DIAGNOSIS — Z7901 Long term (current) use of anticoagulants: Secondary | ICD-10-CM | POA: Diagnosis not present

## 2014-10-06 DIAGNOSIS — I4891 Unspecified atrial fibrillation: Secondary | ICD-10-CM | POA: Diagnosis not present

## 2014-10-06 LAB — POCT INR: INR: 1.9

## 2014-10-11 ENCOUNTER — Ambulatory Visit (INDEPENDENT_AMBULATORY_CARE_PROVIDER_SITE_OTHER): Payer: Medicare Other | Admitting: Family Medicine

## 2014-10-11 VITALS — BP 127/77 | HR 76 | Temp 98.6°F | Resp 16 | Ht 75.0 in | Wt 230.0 lb

## 2014-10-11 DIAGNOSIS — L03114 Cellulitis of left upper limb: Secondary | ICD-10-CM | POA: Diagnosis not present

## 2014-10-11 DIAGNOSIS — T63441A Toxic effect of venom of bees, accidental (unintentional), initial encounter: Secondary | ICD-10-CM | POA: Diagnosis not present

## 2014-10-11 MED ORDER — CEPHALEXIN 500 MG PO CAPS: 500.0000 mg | ORAL_CAPSULE | Freq: Four times a day (QID) | ORAL | Status: DC

## 2014-10-11 MED ORDER — METHYLPREDNISOLONE ACETATE 80 MG/ML IJ SUSP
120.0000 mg | Freq: Once | INTRAMUSCULAR | Status: AC
  Administered 2014-10-11: 120 mg via INTRAMUSCULAR

## 2014-10-11 NOTE — Patient Instructions (Signed)
Take the anabolic one pill 3 times daily, cephalexin 500 mg  If the place gets more swollen or irritated return  Return if systemic symptoms such as fever or nausea or vomiting  You have received an injection of Depo-Medrol 120 mg, which is a time release cortisone type medication

## 2014-10-11 NOTE — Progress Notes (Signed)
  Subjective:  Patient ID: [name], male    DOB: February 17, 1941  Age: 74 y.o. MRN: [id number]  74 year old with a history of a bee sting.  He was working in his garden yesterday and something got up his left sleeve. He doesn't know what bit him stung him, but it was killed immediately with a hard slap. He doesn't think it is a yellow jacket because he knows what they're like. That was Saturday, and he is developed more swelling and redness with erythema from the halfway up the upper arm down to the elbow.   Objective:   Staying spot can be seen with a little dark center. This was curetted out, may have just been a little piece of scab. The whole area from mid upper arm to the elbow on the back half of the arm is red and indurated. No axillary node.  Assessment & Plan:   Assessment: Bee sting with possible cellulitis  Plan: Depo-Medrol 120 and cephalexin There are no Patient Instructions on file for this visit.   Lilyrose Tanney, MD 10/11/2014

## 2014-11-01 ENCOUNTER — Ambulatory Visit: Payer: Medicare Other | Admitting: Pharmacist Clinician (PhC)/ Clinical Pharmacy Specialist

## 2014-11-08 ENCOUNTER — Ambulatory Visit (INDEPENDENT_AMBULATORY_CARE_PROVIDER_SITE_OTHER): Payer: Medicare Other | Admitting: Pharmacist Clinician (PhC)/ Clinical Pharmacy Specialist

## 2014-11-08 DIAGNOSIS — I4891 Unspecified atrial fibrillation: Secondary | ICD-10-CM | POA: Diagnosis not present

## 2014-11-08 DIAGNOSIS — Z7901 Long term (current) use of anticoagulants: Secondary | ICD-10-CM

## 2014-11-08 LAB — POCT INR: INR: 2.7

## 2014-11-17 ENCOUNTER — Ambulatory Visit: Payer: Medicare Other | Admitting: Pharmacist Clinician (PhC)/ Clinical Pharmacy Specialist

## 2014-11-24 ENCOUNTER — Other Ambulatory Visit: Payer: Self-pay | Admitting: Pharmacist Clinician (PhC)/ Clinical Pharmacy Specialist

## 2014-12-08 ENCOUNTER — Other Ambulatory Visit: Payer: Self-pay

## 2014-12-08 ENCOUNTER — Encounter: Payer: Self-pay | Admitting: Cardiovascular Disease

## 2014-12-08 ENCOUNTER — Ambulatory Visit (INDEPENDENT_AMBULATORY_CARE_PROVIDER_SITE_OTHER): Payer: Medicare Other | Admitting: Pharmacist Clinician (PhC)/ Clinical Pharmacy Specialist

## 2014-12-08 ENCOUNTER — Ambulatory Visit (INDEPENDENT_AMBULATORY_CARE_PROVIDER_SITE_OTHER): Payer: Medicare Other | Admitting: Cardiovascular Disease

## 2014-12-08 VITALS — BP 122/64 | HR 49 | Ht 75.0 in | Wt 225.9 lb

## 2014-12-08 DIAGNOSIS — I2510 Atherosclerotic heart disease of native coronary artery without angina pectoris: Secondary | ICD-10-CM

## 2014-12-08 DIAGNOSIS — I482 Chronic atrial fibrillation: Secondary | ICD-10-CM | POA: Diagnosis not present

## 2014-12-08 DIAGNOSIS — I6521 Occlusion and stenosis of right carotid artery: Secondary | ICD-10-CM

## 2014-12-08 DIAGNOSIS — I658 Occlusion and stenosis of other precerebral arteries: Secondary | ICD-10-CM

## 2014-12-08 DIAGNOSIS — I6529 Occlusion and stenosis of unspecified carotid artery: Secondary | ICD-10-CM

## 2014-12-08 DIAGNOSIS — I4821 Permanent atrial fibrillation: Secondary | ICD-10-CM

## 2014-12-08 DIAGNOSIS — I2583 Coronary atherosclerosis due to lipid rich plaque: Secondary | ICD-10-CM

## 2014-12-08 DIAGNOSIS — I10 Essential (primary) hypertension: Secondary | ICD-10-CM | POA: Diagnosis not present

## 2014-12-08 DIAGNOSIS — Z7901 Long term (current) use of anticoagulants: Secondary | ICD-10-CM | POA: Diagnosis not present

## 2014-12-08 DIAGNOSIS — I4891 Unspecified atrial fibrillation: Secondary | ICD-10-CM | POA: Diagnosis not present

## 2014-12-08 LAB — POCT INR: INR: 2.6

## 2014-12-08 NOTE — Assessment & Plan Note (Signed)
History of hyperlipidemia on simvastatin 80 mg a day. He admits to a very healthy diet. We will check a lipid and liver profile

## 2014-12-08 NOTE — Assessment & Plan Note (Signed)
Moderate right ICA stenosis, mild left, we'll recheck carotid Doppler studies.

## 2014-12-08 NOTE — Patient Instructions (Signed)
Medication Instructions:  Your physician recommends that you continue on your current medications as directed. Please refer to the Current Medication list given to you today.   Labwork: Your physician recommends that you return for lab work in: FASTING labs The lab can be found on the FIRST FLOOR of out building in [place] [address]   Testing/Procedures: Your physician has requested that you have a carotid duplex. This test is an ultrasound of the carotid arteries in your neck. It looks at blood flow through these arteries that supply the brain with blood. Allow one hour for this exam. There are no restrictions or special instructions.    Follow-Up: Your physician wants you to follow-up in: 12 months with Dr. Andria Rhein will receive a reminder letter in the mail two months in advance. If you don't receive a letter, please call our office to schedule the follow-up appointment.   Any Other Special Instructions Will Be Listed Below (If Applicable).

## 2014-12-08 NOTE — Addendum Note (Signed)
Addended by: Leanord Asal T on: 12/08/2014 05:23 PM   Modules accepted: Orders, Medications

## 2014-12-08 NOTE — Assessment & Plan Note (Signed)
History of hypertension with blood pressure measured today at 122/64. He is on metoprolol. Continue current meds at current dosing

## 2014-12-08 NOTE — Progress Notes (Signed)
12/08/2014 CHENG DEC   June 19, 1940  [id number]  Primary Physician Irven Shelling, MD Primary Cardiologist: Lorretta Harp MD Renae Gloss   HPI:  The patient is a very pleasant 74 year old, mildly overweight, married Caucasian male father of 2, grandfather of 3 grandchildren who I saw 12 months ago. He has a history of CAD status post RCA stenting by myself January 11, 1998. He had normal circumflex, LAD and ejection fraction. His other problems include hypertension and chronic atrial fibrillation on Coumadin anticoagulation, rate controlled, as well as erectile dysfunction. He is totally asymptomatic. He did have colon cancer picked up on screening colonoscopy and underwent right colectomy May 29, 2006 followed by 11 cycles of adjuvant Folfox chemotherapy. [name] He is very active exercises 6 days a week doing weight training 3 days a week, and lower extremity training 3 times a week.Marland KitchenHe is completely asymptomatic   Current Outpatient Prescriptions  Medication Sig Dispense Refill  . Alpha Lipoic Acid 200 MG CAPS Take 200 mg by mouth 3 (three) times daily.    [name] aspirin 81 MG tablet Take 81 mg by mouth daily.    . cephALEXin (KEFLEX) 500 MG capsule Take 1 capsule (500 mg total) by mouth 4 (four) times daily. 21 capsule 0  . cholecalciferol (VITAMIN D) 1000 UNITS tablet Take 1,000 Units by mouth daily.    . Coenzyme Q10 200 MG capsule Take 200 mg by mouth daily.    . Cyanocobalamin (VITAMIN B 12 PO) Take 1 tablet by mouth daily.     . metoprolol succinate (TOPROL-XL) 25 MG 24 hr tablet Take 12.5 mg by mouth daily.    . Omega-3 Fatty Acids (FISH OIL) 1200 MG CAPS Take 1,000 mg by mouth 2 (two) times daily.     . simvastatin (ZOCOR) 80 MG tablet TAKE ONE TABLET AT BEDTIME. 90 tablet 0  . warfarin (COUMADIN) 7.5 MG tablet Take 1 to 1.5 tablets by mouth daily as directed by coumadin clinic 135 tablet 1   No current facility-administered medications for this visit.     No Known Allergies  Social History   Social History  . Marital Status: Married    Spouse Name: [name]  . Number of Children: 2  . Years of Education: N/A   Occupational History  . retired    Social History Main Topics  . Smoking status: Former Smoker -- 30 years    Quit date: 11/25/1993  . Smokeless tobacco: Never Used  . Alcohol Use: Yes     Comment: social drinking  . Drug Use: No  . Sexual Activity: Not on file   Other Topics Concern  . Not on file   Social History Narrative     Review of Systems: General: negative for chills, fever, night sweats or weight changes.  Cardiovascular: negative for chest pain, dyspnea on exertion, edema, orthopnea, palpitations, paroxysmal nocturnal dyspnea or shortness of breath Dermatological: negative for rash Respiratory: negative for cough or wheezing Urologic: negative for hematuria Abdominal: negative for nausea, vomiting, diarrhea, bright red blood per rectum, melena, or hematemesis Neurologic: negative for visual changes, syncope, or dizziness All other systems reviewed and are otherwise negative except as noted above.    Blood pressure 122/64, pulse 49, height 6\' 3"  (1.905 m), weight 225 lb 14.4 oz (102.468 kg).  General appearance: alert and no distress Neck: no adenopathy, no carotid bruit, no JVD, supple, symmetrical, trachea midline and thyroid not enlarged, symmetric, no tenderness/mass/nodules Lungs: clear to auscultation bilaterally Heart:  irregularly irregular rhythm Extremities: extremities normal, atraumatic, no cyanosis or edema Pulses: 2+ and symmetric Skin: Skin color, texture, turgor normal. No rashes or lesions Neurologic: Grossly normal  EKG atrial fibrillation with a ventricular response of 49. I presume reviewed this EKG  ASSESSMENT AND PLAN:   Permanent atrial fibrillation History of chronic atrial fibrillation rate controlled on Coumadin anticoagulation  Hyperlipidemia History of  hyperlipidemia on simvastatin 80 mg a day. He admits to a very healthy diet. We will check a lipid and liver profile  Essential hypertension History of hypertension with blood pressure measured today at 122/64. He is on metoprolol. Continue current meds at current dosing      Lorretta Harp MD [facility] 12/08/2014 11:22 AM

## 2014-12-08 NOTE — Assessment & Plan Note (Signed)
History of chronic atrial fibrillation rate controlled on Coumadin anticoagulation. 

## 2014-12-13 ENCOUNTER — Telehealth: Payer: Self-pay

## 2014-12-13 DIAGNOSIS — R899 Unspecified abnormal finding in specimens from other organs, systems and tissues: Secondary | ICD-10-CM

## 2014-12-13 LAB — HEPATIC FUNCTION PANEL
ALBUMIN: 4.6 g/dL (ref 3.6–5.1)
ALT: 71 U/L — ABNORMAL HIGH (ref 9–46)
AST: 45 U/L — ABNORMAL HIGH (ref 10–35)
Alkaline Phosphatase: 59 U/L (ref 40–115)
BILIRUBIN INDIRECT: 0.6 mg/dL (ref 0.2–1.2)
Bilirubin, Direct: 0.2 mg/dL (ref <=0.2)
Total Bilirubin: 0.8 mg/dL (ref 0.2–1.2)
Total Protein: 6.8 g/dL (ref 6.1–8.1)

## 2014-12-13 LAB — LIPID PANEL
Cholesterol: 201 mg/dL — ABNORMAL HIGH (ref 125–200)
HDL: 60 mg/dL (ref >=40)
LDL CALC: 94 mg/dL (ref <130)
Total CHOL/HDL Ratio: 3.4 Ratio (ref <=5.0)
Triglycerides: 236 mg/dL — ABNORMAL HIGH (ref <150)
VLDL: 47 mg/dL — AB (ref <30)

## 2014-12-13 NOTE — Telephone Encounter (Signed)
Pt orders mailed to him for repeat LFT. Pt aware

## 2014-12-13 NOTE — Telephone Encounter (Signed)
-----   Message from Lorretta Harp, MD sent at 12/13/2014  4:39 PM EDT ----- Recheck LFTs 4-6 weeks

## 2014-12-16 ENCOUNTER — Other Ambulatory Visit: Payer: Self-pay | Admitting: Cardiovascular Disease

## 2014-12-16 NOTE — Telephone Encounter (Signed)
Rx(s) sent to pharmacy electronically.  

## 2014-12-28 ENCOUNTER — Encounter (HOSPITAL_COMMUNITY): Payer: Medicare Other

## 2015-01-03 ENCOUNTER — Ambulatory Visit (INDEPENDENT_AMBULATORY_CARE_PROVIDER_SITE_OTHER): Payer: Medicare Other | Admitting: Pharmacist Clinician (PhC)/ Clinical Pharmacy Specialist

## 2015-01-03 DIAGNOSIS — I4891 Unspecified atrial fibrillation: Secondary | ICD-10-CM | POA: Diagnosis not present

## 2015-01-03 DIAGNOSIS — Z7901 Long term (current) use of anticoagulants: Secondary | ICD-10-CM

## 2015-01-03 LAB — POCT INR: INR: 3.4

## 2015-01-04 ENCOUNTER — Ambulatory Visit (HOSPITAL_COMMUNITY)
Admission: RE | Admit: 2015-01-04 | Discharge: 2015-01-04 | Disposition: A | Discharge status: Home / Self Care | Payer: Medicare Other | Source: Ambulatory Visit | Attending: Cardiovascular Disease | Admitting: Cardiovascular Disease

## 2015-01-04 DIAGNOSIS — I2510 Atherosclerotic heart disease of native coronary artery without angina pectoris: Secondary | ICD-10-CM | POA: Insufficient documentation

## 2015-01-04 DIAGNOSIS — I6521 Occlusion and stenosis of right carotid artery: Secondary | ICD-10-CM

## 2015-01-04 DIAGNOSIS — R0989 Other specified symptoms and signs involving the circulatory and respiratory systems: Secondary | ICD-10-CM | POA: Insufficient documentation

## 2015-01-04 DIAGNOSIS — I6523 Occlusion and stenosis of bilateral carotid arteries: Secondary | ICD-10-CM | POA: Diagnosis not present

## 2015-01-04 DIAGNOSIS — I10 Essential (primary) hypertension: Secondary | ICD-10-CM | POA: Insufficient documentation

## 2015-01-04 DIAGNOSIS — E785 Hyperlipidemia, unspecified: Secondary | ICD-10-CM | POA: Diagnosis not present

## 2015-01-17 ENCOUNTER — Other Ambulatory Visit: Payer: Self-pay | Admitting: *Deleted

## 2015-01-17 ENCOUNTER — Telehealth: Payer: Self-pay | Admitting: Cardiovascular Disease

## 2015-01-17 DIAGNOSIS — R899 Unspecified abnormal finding in specimens from other organs, systems and tissues: Secondary | ICD-10-CM

## 2015-01-17 LAB — HEPATIC FUNCTION PANEL
ALT: 44 U/L (ref 9–46)
AST: 30 U/L (ref 10–35)
Albumin: 4.5 g/dL (ref 3.6–5.1)
Alkaline Phosphatase: 67 U/L (ref 40–115)
BILIRUBIN DIRECT: 0.2 mg/dL (ref <=0.2)
BILIRUBIN INDIRECT: 0.6 mg/dL (ref 0.2–1.2)
Total Bilirubin: 0.8 mg/dL (ref 0.2–1.2)
Total Protein: 6.7 g/dL (ref 6.1–8.1)

## 2015-01-17 NOTE — Telephone Encounter (Signed)
Needs a lab order . Patient is at the lab now. Thanks

## 2015-01-17 NOTE — Telephone Encounter (Signed)
Spoke with Katrina - needs order for lab. LFT ordered after last lipid test. Order for LFT released.

## 2015-01-20 ENCOUNTER — Telehealth: Payer: Self-pay | Admitting: Cardiovascular Disease

## 2015-01-20 NOTE — Telephone Encounter (Signed)
Patient is returning a call to Kim--Dr. Kennon Holter nurse.

## 2015-01-20 NOTE — Telephone Encounter (Signed)
Results discussed, pt verbalized understanding,.

## 2015-01-24 ENCOUNTER — Ambulatory Visit (INDEPENDENT_AMBULATORY_CARE_PROVIDER_SITE_OTHER): Payer: Medicare Other | Admitting: Pharmacist Clinician (PhC)/ Clinical Pharmacy Specialist

## 2015-01-24 DIAGNOSIS — Z7901 Long term (current) use of anticoagulants: Secondary | ICD-10-CM

## 2015-01-24 DIAGNOSIS — I4891 Unspecified atrial fibrillation: Secondary | ICD-10-CM | POA: Diagnosis not present

## 2015-01-24 LAB — POCT INR: INR: 2.1

## 2015-02-21 ENCOUNTER — Ambulatory Visit (INDEPENDENT_AMBULATORY_CARE_PROVIDER_SITE_OTHER): Payer: Medicare Other | Admitting: Pharmacist Clinician (PhC)/ Clinical Pharmacy Specialist

## 2015-02-21 DIAGNOSIS — I4891 Unspecified atrial fibrillation: Secondary | ICD-10-CM | POA: Diagnosis not present

## 2015-02-21 DIAGNOSIS — Z7901 Long term (current) use of anticoagulants: Secondary | ICD-10-CM | POA: Diagnosis not present

## 2015-02-21 LAB — POCT INR: INR: 2.5

## 2015-03-04 ENCOUNTER — Other Ambulatory Visit: Payer: Self-pay | Admitting: Cardiovascular Disease

## 2015-03-22 ENCOUNTER — Ambulatory Visit (INDEPENDENT_AMBULATORY_CARE_PROVIDER_SITE_OTHER): Payer: Medicare Other | Admitting: Pharmacist Clinician (PhC)/ Clinical Pharmacy Specialist

## 2015-03-22 DIAGNOSIS — Z7901 Long term (current) use of anticoagulants: Secondary | ICD-10-CM | POA: Diagnosis not present

## 2015-03-22 DIAGNOSIS — I4891 Unspecified atrial fibrillation: Secondary | ICD-10-CM

## 2015-03-22 LAB — POCT INR: INR: 2

## 2015-04-18 ENCOUNTER — Ambulatory Visit (INDEPENDENT_AMBULATORY_CARE_PROVIDER_SITE_OTHER): Payer: Medicare Other | Admitting: Pharmacist Clinician (PhC)/ Clinical Pharmacy Specialist

## 2015-04-18 DIAGNOSIS — I4891 Unspecified atrial fibrillation: Secondary | ICD-10-CM

## 2015-04-18 DIAGNOSIS — Z7901 Long term (current) use of anticoagulants: Secondary | ICD-10-CM | POA: Diagnosis not present

## 2015-04-18 LAB — POCT INR: INR: 1.9

## 2015-05-30 ENCOUNTER — Ambulatory Visit (INDEPENDENT_AMBULATORY_CARE_PROVIDER_SITE_OTHER): Payer: Medicare Other | Admitting: Pharmacist Clinician (PhC)/ Clinical Pharmacy Specialist

## 2015-05-30 DIAGNOSIS — I4891 Unspecified atrial fibrillation: Secondary | ICD-10-CM

## 2015-05-30 DIAGNOSIS — Z7901 Long term (current) use of anticoagulants: Secondary | ICD-10-CM | POA: Diagnosis not present

## 2015-05-30 LAB — POCT INR: INR: 2.1

## 2015-07-11 ENCOUNTER — Ambulatory Visit (INDEPENDENT_AMBULATORY_CARE_PROVIDER_SITE_OTHER): Payer: Medicare Other | Admitting: Pharmacist

## 2015-07-11 DIAGNOSIS — Z7901 Long term (current) use of anticoagulants: Secondary | ICD-10-CM | POA: Diagnosis not present

## 2015-07-11 DIAGNOSIS — I4891 Unspecified atrial fibrillation: Secondary | ICD-10-CM

## 2015-07-11 LAB — POCT INR: INR: 3.8

## 2015-07-27 ENCOUNTER — Other Ambulatory Visit: Payer: Self-pay | Admitting: Cardiovascular Disease

## 2015-08-08 ENCOUNTER — Ambulatory Visit (INDEPENDENT_AMBULATORY_CARE_PROVIDER_SITE_OTHER): Payer: Medicare Other | Admitting: Pharmacist

## 2015-08-08 DIAGNOSIS — Z7901 Long term (current) use of anticoagulants: Secondary | ICD-10-CM

## 2015-08-08 DIAGNOSIS — I4891 Unspecified atrial fibrillation: Secondary | ICD-10-CM

## 2015-08-08 LAB — POCT INR: INR: 3.6

## 2015-08-29 ENCOUNTER — Ambulatory Visit (INDEPENDENT_AMBULATORY_CARE_PROVIDER_SITE_OTHER): Payer: Medicare Other | Admitting: Pharmacist

## 2015-08-29 DIAGNOSIS — Z7901 Long term (current) use of anticoagulants: Secondary | ICD-10-CM

## 2015-08-29 DIAGNOSIS — I4891 Unspecified atrial fibrillation: Secondary | ICD-10-CM

## 2015-08-29 LAB — POCT INR: INR: 2.2

## 2015-09-26 ENCOUNTER — Ambulatory Visit (INDEPENDENT_AMBULATORY_CARE_PROVIDER_SITE_OTHER): Payer: Medicare Other | Admitting: Pharmacist Clinician (PhC)/ Clinical Pharmacy Specialist

## 2015-09-26 DIAGNOSIS — I4891 Unspecified atrial fibrillation: Secondary | ICD-10-CM

## 2015-09-26 DIAGNOSIS — Z7901 Long term (current) use of anticoagulants: Secondary | ICD-10-CM

## 2015-09-26 LAB — POCT INR: INR: 2.1

## 2015-10-31 ENCOUNTER — Ambulatory Visit (INDEPENDENT_AMBULATORY_CARE_PROVIDER_SITE_OTHER): Payer: Medicare Other | Admitting: Pharmacist

## 2015-10-31 DIAGNOSIS — I4891 Unspecified atrial fibrillation: Secondary | ICD-10-CM

## 2015-10-31 DIAGNOSIS — Z7901 Long term (current) use of anticoagulants: Secondary | ICD-10-CM

## 2015-10-31 LAB — POCT INR: INR: 3.6

## 2015-11-22 ENCOUNTER — Ambulatory Visit (INDEPENDENT_AMBULATORY_CARE_PROVIDER_SITE_OTHER): Payer: Medicare Other | Admitting: Pharmacist Clinician (PhC)/ Clinical Pharmacy Specialist

## 2015-11-22 DIAGNOSIS — I4891 Unspecified atrial fibrillation: Secondary | ICD-10-CM | POA: Diagnosis not present

## 2015-11-22 DIAGNOSIS — Z7901 Long term (current) use of anticoagulants: Secondary | ICD-10-CM

## 2015-11-22 LAB — POCT INR: INR: 3.6

## 2015-11-22 MED ORDER — APIXABAN 5 MG PO TABS: 5.0000 mg | ORAL_TABLET | Freq: Two times a day (BID) | ORAL | 5 refills | Status: DC

## 2015-11-23 ENCOUNTER — Telehealth: Payer: Self-pay | Admitting: *Deleted

## 2015-11-23 NOTE — Telephone Encounter (Signed)
Called [facility] to submit prior authorization for Eliquis 5mg , Take 1 tablet by mouth twice daily. ICD 10 code used I48.91. Prior authorization approved from today 11/23/15 until 03/18/16. Case # [id number].  [place] pharmacy to notify that prior auth completed and approved. They ran it in their system and the medication went through with no problem.

## 2015-11-28 ENCOUNTER — Telehealth: Payer: Self-pay | Admitting: Cardiovascular Disease

## 2015-11-28 NOTE — Telephone Encounter (Signed)
New Message  Pt c/o medication issue:  1. Name of Medication: Eloquis  2. How are you currently taking this medication (dosage and times per day)? 5 mg tabs twice daily  3. Are you having a reaction (difficulty breathing--STAT)? No  4. What is your medication issue? Pt voiced he feels he's having some side effects from this medication and prefer to speak to [place] or [employer].  Please f/u with pt.

## 2015-11-28 NOTE — Telephone Encounter (Signed)
Pt preferred to speak to pharmD.

## 2015-11-28 NOTE — Telephone Encounter (Signed)
Pt at urgent care to be evaluated for symptoms below.   He recently started Eliquis on Thursday of last week.   He reports being fine until about 1130am today - just felt weird, feels tired, arm and legs feel tingly. Pt states he thinks it might be dizzy or lightheaded but he is not good at describing symptoms.  Pt reports no other changes.   He is wanting to be evaluated by [facility] physicians (the Urgent care he is currently waiting to be seen at) to ensure nothing else is going on. If everything clear he will call us so that we can change the medication. Will await call back.

## 2015-11-29 NOTE — Telephone Encounter (Signed)
Spoke to patient about symptoms yesterday and findings at urgent care.  He states they found that he was having orthostatic hypotension and that symptoms were unrelated to Eliquis. Thus he will remain on Eliquis.  He has made an appt to follow up with Dr. Gwenlyn Found.  He reports good hydration though admits that he did have a few more cups of coffee that usual yesterday.  He also reports that he is somewhat disappointed that they recommended him to stay out of the gym until his follow up with Dr. Gwenlyn Found, but will heed the recommendation at this time.

## 2015-12-07 ENCOUNTER — Other Ambulatory Visit: Payer: Self-pay | Admitting: Cardiovascular Disease

## 2015-12-07 MED ORDER — APIXABAN 5 MG PO TABS: 5.0000 mg | ORAL_TABLET | Freq: Two times a day (BID) | ORAL | 0 refills | Status: DC

## 2015-12-28 ENCOUNTER — Ambulatory Visit (INDEPENDENT_AMBULATORY_CARE_PROVIDER_SITE_OTHER): Payer: Medicare Other | Admitting: Cardiovascular Disease

## 2015-12-28 ENCOUNTER — Encounter: Payer: Self-pay | Admitting: Cardiovascular Disease

## 2015-12-28 ENCOUNTER — Telehealth: Payer: Self-pay | Admitting: *Deleted

## 2015-12-28 DIAGNOSIS — E7800 Pure hypercholesterolemia, unspecified: Secondary | ICD-10-CM | POA: Diagnosis not present

## 2015-12-28 DIAGNOSIS — I6523 Occlusion and stenosis of bilateral carotid arteries: Secondary | ICD-10-CM | POA: Diagnosis not present

## 2015-12-28 DIAGNOSIS — I482 Chronic atrial fibrillation: Secondary | ICD-10-CM | POA: Diagnosis not present

## 2015-12-28 DIAGNOSIS — I4821 Permanent atrial fibrillation: Secondary | ICD-10-CM

## 2015-12-28 DIAGNOSIS — Z79899 Other long term (current) drug therapy: Secondary | ICD-10-CM

## 2015-12-28 NOTE — Telephone Encounter (Signed)
Called to request most recent Lipid and liver profile for pt. According to their records, pt has not had it drawn there since 2014. Dr Gwenlyn Found notified and he ordered for patient to have Lipid and liver profile.  Patient notified. He knows where the lab is downstairs and will go at his earliest convenience.

## 2015-12-28 NOTE — Progress Notes (Signed)
12/28/2015 [name]   [birth year]  [id number]  Primary Physician Irven Shelling, MD Primary Cardiologist: Lorretta Harp MD Lupe Carney, [place]  HPI:  The patient is a very pleasant 75 year old, mildly overweight, married Caucasian male father of 2, grandfather of 3 grandchildren who I saw 12/08/14. He has a history of CAD status post RCA stenting by myself January 11, 1998. He had normal circumflex, LAD and ejection fraction. His other problems include hypertension and chronic atrial fibrillation on Coumadin anticoagulation, rate controlled, as well as erectile dysfunction. He is totally asymptomatic. He did have colon cancer picked up on screening colonoscopy and underwent right colectomy May 29, 2006 followed by 11 cycles of adjuvant Folfox chemotherapy. [name] He is very active exercises 6 days a week doing weight training 3 days a week, and lower extremity training 3 times a week.Marland KitchenHe is completely asymptomatic   Current Outpatient Prescriptions  Medication Sig Dispense Refill  . Alpha Lipoic Acid 200 MG CAPS Take 200 mg by mouth 3 (three) times daily.    [name] apixaban (ELIQUIS) 5 MG TABS tablet Take 1 tablet (5 mg total) by mouth 2 (two) times daily. 180 tablet 0  . aspirin 81 MG tablet Take 81 mg by mouth daily.    . cephALEXin (KEFLEX) 500 MG capsule Take 1 capsule (500 mg total) by mouth 4 (four) times daily. 21 capsule 0  . cholecalciferol (VITAMIN D) 1000 UNITS tablet Take 1,000 Units by mouth daily.    . Coenzyme Q10 200 MG capsule Take 200 mg by mouth daily.    . Cyanocobalamin (VITAMIN B 12 PO) Take 1 tablet by mouth daily.     . metoprolol succinate (TOPROL-XL) 25 MG 24 hr tablet TAKE 1 TABLET EACH DAY. 90 tablet 2  . Omega-3 Fatty Acids (FISH OIL) 1200 MG CAPS Take 1,000 mg by mouth 2 (two) times daily.     . simvastatin (ZOCOR) 80 MG tablet TAKE ONE TABLET AT BEDTIME. 90 tablet 3   No current facility-administered medications for this visit.     No Known  Allergies  Social History   Social History  . Marital status: Married    Spouse name: [name]  . Number of children: 2  . Years of education: N/A   Occupational History  . retired Retired   Social History Main Topics  . Smoking status: Former Smoker    Years: 30.00    Quit date: 11/25/1993  . Smokeless tobacco: Never Used  . Alcohol use Yes     Comment: social drinking  . Drug use: No  . Sexual activity: Not on file   Other Topics Concern  . Not on file   Social History Narrative  . No narrative on file     Review of Systems: General: negative for chills, fever, night sweats or weight changes.  Cardiovascular: negative for chest pain, dyspnea on exertion, edema, orthopnea, palpitations, paroxysmal nocturnal dyspnea or shortness of breath Dermatological: negative for rash Respiratory: negative for cough or wheezing Urologic: negative for hematuria Abdominal: negative for nausea, vomiting, diarrhea, bright red blood per rectum, melena, or hematemesis Neurologic: negative for visual changes, syncope, or dizziness All other systems reviewed and are otherwise negative except as noted above.    Blood pressure 122/68, pulse 65, height 6\' 3"  (1.905 m), weight 230 lb (104.3 kg).  General appearance: alert and no distress Neck: no adenopathy, no carotid bruit, no JVD, supple, symmetrical, trachea midline and thyroid not enlarged, symmetric, no tenderness/mass/nodules  Lungs: clear to auscultation bilaterally Heart: irregularly irregular rhythm Extremities: extremities normal, atraumatic, no cyanosis or edema  EKG atrial fibrillation with a ventricular response of 65 and nonspecific ST and T-wave changes. He did have poor R-wave progression and low limb voltage. I personally reviewed this EKG.  ASSESSMENT AND PLAN:   Permanent atrial fibrillation History of chronic A. fib rate controlled on Eliquis oral regulation.  Carotid stenosis, asymptomatic History of moderate right  and mild left ICA stenosis by duplex ultrasound 01/04/15. Will recheck carotid Doppler studies.  CAD (coronary artery disease) History of coronary artery disease status post RCA stenting by myself 01/11/98. He did have normal circumflex and LAD with normal EF. He is otherwise asymptomatic.  Essential hypertension History of hypertension blood pressure measures 120/68. He is on metoprolol. Continue current meds at current dosing  Hyperlipidemia History of hyperlipidemia on statin therapy followed by his PCP.      Lorretta Harp MD FACP,FACC,FAHA, [facility] 12/28/2015 2:55 PM

## 2015-12-28 NOTE — Assessment & Plan Note (Signed)
History of chronic A. fib rate controlled on Eliquis oral regulation.

## 2015-12-28 NOTE — Patient Instructions (Signed)
Medication Instructions:  NO CHANGES.  Labwork: Labwork will be requested from your primary care physician.   Testing/Procedures: Your physician has requested that you have a carotid duplex. This test is an ultrasound of the carotid arteries in your neck. It looks at blood flow through these arteries that supply the brain with blood. Allow one hour for this exam. There are no restrictions or special instructions.    Follow-Up: Your physician wants you to follow-up in: [place].  You will receive a reminder letter in the mail two months in advance. If you don't receive a letter, please call our office to schedule the follow-up appointment.   If you need a refill on your cardiac medications before your next appointment, please call your pharmacy.

## 2015-12-28 NOTE — Assessment & Plan Note (Signed)
History of hyperlipidemia on statin therapy followed by his PCP 

## 2015-12-28 NOTE — Assessment & Plan Note (Signed)
History of coronary artery disease status post RCA stenting by myself 01/11/98. He did have normal circumflex and LAD with normal EF. He is otherwise asymptomatic.

## 2015-12-28 NOTE — Assessment & Plan Note (Signed)
History of moderate right and mild left ICA stenosis by duplex ultrasound 01/04/15. Will recheck carotid Doppler studies.

## 2015-12-28 NOTE — Assessment & Plan Note (Signed)
History of hypertension blood pressure measures 120/68. He is on metoprolol. Continue current meds at current dosing

## 2016-01-04 ENCOUNTER — Other Ambulatory Visit: Payer: Self-pay | Admitting: Cardiovascular Disease

## 2016-01-05 ENCOUNTER — Telehealth: Payer: Self-pay | Admitting: Cardiovascular Disease

## 2016-01-05 ENCOUNTER — Ambulatory Visit (HOSPITAL_COMMUNITY)
Admission: RE | Admit: 2016-01-05 | Discharge: 2016-01-05 | Disposition: A | Discharge status: Home / Self Care | Payer: Medicare Other | Source: Ambulatory Visit | Attending: Cardiovascular Disease | Admitting: Cardiovascular Disease

## 2016-01-05 ENCOUNTER — Encounter: Payer: Self-pay | Admitting: *Deleted

## 2016-01-05 DIAGNOSIS — I10 Essential (primary) hypertension: Secondary | ICD-10-CM | POA: Diagnosis not present

## 2016-01-05 DIAGNOSIS — I4821 Permanent atrial fibrillation: Secondary | ICD-10-CM

## 2016-01-05 DIAGNOSIS — I6523 Occlusion and stenosis of bilateral carotid arteries: Secondary | ICD-10-CM | POA: Insufficient documentation

## 2016-01-05 DIAGNOSIS — I2510 Atherosclerotic heart disease of native coronary artery without angina pectoris: Secondary | ICD-10-CM | POA: Diagnosis not present

## 2016-01-05 DIAGNOSIS — J449 Chronic obstructive pulmonary disease, unspecified: Secondary | ICD-10-CM | POA: Diagnosis not present

## 2016-01-05 DIAGNOSIS — I482 Chronic atrial fibrillation: Secondary | ICD-10-CM | POA: Diagnosis present

## 2016-01-05 DIAGNOSIS — E785 Hyperlipidemia, unspecified: Secondary | ICD-10-CM | POA: Diagnosis not present

## 2016-01-05 DIAGNOSIS — Z87891 Personal history of nicotine dependence: Secondary | ICD-10-CM | POA: Diagnosis not present

## 2016-01-05 LAB — HEPATIC FUNCTION PANEL
ALBUMIN: 4.6 g/dL (ref 3.6–5.1)
ALT: 50 U/L — AB (ref 9–46)
AST: 35 U/L (ref 10–35)
Alkaline Phosphatase: 52 U/L (ref 40–115)
Bilirubin, Direct: 0.2 mg/dL (ref <=0.2)
Indirect Bilirubin: 0.9 mg/dL (ref 0.2–1.2)
TOTAL PROTEIN: 6.6 g/dL (ref 6.1–8.1)
Total Bilirubin: 1.1 mg/dL (ref 0.2–1.2)

## 2016-01-05 LAB — LIPID PANEL
CHOLESTEROL: 178 mg/dL (ref 125–200)
HDL: 61 mg/dL (ref >=40)
LDL Cholesterol: 86 mg/dL (ref <130)
Total CHOL/HDL Ratio: 2.9 Ratio (ref <=5.0)
Triglycerides: 156 mg/dL — ABNORMAL HIGH (ref <150)
VLDL: 31 mg/dL — ABNORMAL HIGH (ref <30)

## 2016-01-05 NOTE — Telephone Encounter (Signed)
REFILL 

## 2016-01-05 NOTE — Telephone Encounter (Signed)
Pt is returning your call for test results wondering if he can just get them through mychart.

## 2016-01-05 NOTE — Telephone Encounter (Signed)
Notes Recorded by Lorretta Harp, MD on 01/05/2016 at 7:23 AM EDT Excellent FLP and LFTs          Returned call to patient. Results given. We discussed MyChart and DPR form.  He is coming today for dopplers and will fill out DPR and ask about how to activate his MyChart. Lab results printed and left at front desk for patient when he comes.

## 2016-01-05 NOTE — Telephone Encounter (Signed)
Pt is returning your call for test results wondering if he can get them in [place]

## 2016-01-06 ENCOUNTER — Telehealth: Payer: Self-pay | Admitting: *Deleted

## 2016-01-06 DIAGNOSIS — I6523 Occlusion and stenosis of bilateral carotid arteries: Secondary | ICD-10-CM

## 2016-01-06 NOTE — Telephone Encounter (Signed)
-----   Message from Lorretta Harp, MD sent at 01/06/2016 12:45 PM EDT ----- No change from prior study. Repeat in 12 months.

## 2016-03-20 ENCOUNTER — Other Ambulatory Visit: Payer: Self-pay | Admitting: Cardiovascular Disease

## 2016-03-20 NOTE — Telephone Encounter (Signed)
Rx has been sent to the pharmacy electronically. ° °

## 2016-05-02 ENCOUNTER — Telehealth: Payer: Self-pay | Admitting: Cardiovascular Disease

## 2016-05-02 NOTE — Telephone Encounter (Signed)
Returned call to patient.Advised he will need to call PCP for Tamiflu prescription.

## 2016-05-02 NOTE — Telephone Encounter (Signed)
New Message  Pt call requesting to speak with RN about getting a prescription for tamiflu. Please call back to discuss

## 2016-06-26 ENCOUNTER — Telehealth: Payer: Self-pay | Admitting: Cardiovascular Disease

## 2016-06-26 NOTE — Telephone Encounter (Signed)
Patient notified of pharmacy recommendations.

## 2016-06-26 NOTE — Telephone Encounter (Signed)
Please call,question about a medication.Pt did not state what medication.

## 2016-06-26 NOTE — Telephone Encounter (Signed)
He can take 2 benadryl (50 mg total), that often makes people drowsy.

## 2016-06-26 NOTE — Telephone Encounter (Signed)
Returned call to patient Patient has a long flight coming up Friday He would like a prescription for a sleeping aide Advised that Dr. Gwenlyn Found would likely not Rx a sleeping aide/anxiolytic  He states he has contacted his PCP who won't Rx anything He would still like me ask MD He states he would take something OTC but is worried about interaction w/eliquis - will defer to pharmacy staff for advice on this.

## 2016-07-03 ENCOUNTER — Encounter: Payer: Self-pay | Admitting: Gastroenterology

## 2016-10-10 ENCOUNTER — Other Ambulatory Visit: Payer: Self-pay

## 2016-10-10 MED ORDER — SIMVASTATIN 80 MG PO TABS: 80.0000 mg | ORAL_TABLET | Freq: Every day | ORAL | 0 refills | Status: DC

## 2016-10-12 ENCOUNTER — Other Ambulatory Visit: Payer: Self-pay

## 2016-10-12 MED ORDER — METOPROLOL SUCCINATE ER 25 MG PO TB24
ORAL_TABLET | ORAL | 1 refills | Status: DC
Start: 2016-10-12 — End: 2016-10-18

## 2016-10-18 ENCOUNTER — Encounter (INDEPENDENT_AMBULATORY_CARE_PROVIDER_SITE_OTHER): Payer: Self-pay

## 2016-10-18 ENCOUNTER — Ambulatory Visit (INDEPENDENT_AMBULATORY_CARE_PROVIDER_SITE_OTHER): Payer: Medicare Other | Admitting: Gastroenterology

## 2016-10-18 ENCOUNTER — Telehealth: Payer: Self-pay | Admitting: Cardiovascular Disease

## 2016-10-18 ENCOUNTER — Encounter: Payer: Self-pay | Admitting: Gastroenterology

## 2016-10-18 ENCOUNTER — Telehealth: Payer: Self-pay

## 2016-10-18 VITALS — BP 138/60 | HR 60 | Ht 75.0 in | Wt 233.0 lb

## 2016-10-18 DIAGNOSIS — Z85038 Personal history of other malignant neoplasm of large intestine: Secondary | ICD-10-CM | POA: Diagnosis not present

## 2016-10-18 DIAGNOSIS — Z7901 Long term (current) use of anticoagulants: Secondary | ICD-10-CM

## 2016-10-18 DIAGNOSIS — Z86010 Personal history of colonic polyps: Secondary | ICD-10-CM

## 2016-10-18 MED ORDER — NA SULFATE-K SULFATE-MG SULF 17.5-3.13-1.6 GM/177ML PO SOLN: 1.0000 | Freq: Once | ORAL | 0 refills | Status: AC

## 2016-10-18 NOTE — Patient Instructions (Signed)
You have been scheduled for a colonoscopy. Please follow written instructions given to you at your visit today.  Please pick up your prep supplies at the pharmacy within the next 1-3 days. If you use inhalers (even only as needed), please bring them with you on the day of your procedure. Your physician has requested that you go to www.startemmi.com and enter the access code given to you at your visit today. This web site gives a general overview about your procedure. However, you should still follow specific instructions given to you by our office regarding your preparation for the procedure.  Normal BMI (Body Mass Index- based on height and weight) is between 23 and 30. Your BMI today is Body mass index is 29.12 kg/m. [name] Please consider follow up  regarding your BMI with your Primary Care Provider.  Thank you for choosing me and [facility].  Pricilla Riffle. Dagoberto Ligas., MD., Marval Regal

## 2016-10-18 NOTE — Telephone Encounter (Signed)
She needs clarification on pt's Simvastatin please. Reference#[id number]

## 2016-10-18 NOTE — Telephone Encounter (Signed)
Patient with diagnosis of atrial fibrillation on Eliquis for anticoagulation.    Procedure: colonoscopy Date of procedure: 10/111/18  CHADS2 score of 1 (HTN);  CrCl (no labs at PCP or Cardiology in last 2 years)  Per office protocol, patient can hold Eliquis for 2 days prior to procedure.   Patient will not need bridging with Lovenox (enoxaparin) around procedure.  If not bridging, patient should restart Eliquis on the evening of procedure or day after, at discretion of procedure MD.  Tommy Medal PharmD CPP [facility] [name] MD

## 2016-10-18 NOTE — Progress Notes (Signed)
History of Present Illness: This is a 76 year old male self referred for the evaluation of history of stage III colon cancer in 2008 and several adenomatous colon polyps since then. He is due for a 3 year surveillance colonoscopy. Prior colonoscopy below. He has no gastrointestinal complaints. He is maintained on Eliquis for A. fib. Denies weight loss, abdominal pain, constipation, diarrhea, change in stool caliber, melena, hematochezia, nausea, vomiting, dysphagia, reflux symptoms, chest pain.   Colonoscopy 08/2013: 1. Sessile polyp measuring 6 mm in the transverse colon; polypectomy performed with a cold snare 2. Sessile polyp measuring 4 mm in the sigmoid colon; polypectomy performed with cold forceps 3. Sessile polyp measuring 5 mm in the rectum; polypectomy performed with a cold snare 4. Small internal hemorrhoids  No Known Allergies Outpatient Medications Prior to Visit  Medication Sig Dispense Refill  . Alpha Lipoic Acid 200 MG CAPS Take 200 mg by mouth daily.     [name] aspirin 81 MG tablet Take 81 mg by mouth daily.    . cholecalciferol (VITAMIN D) 1000 UNITS tablet Take 1,000 Units by mouth daily.    . Coenzyme Q10 200 MG capsule Take 200 mg by mouth daily.    . Cyanocobalamin (VITAMIN B 12 PO) Take 1 tablet by mouth daily.     [name] ELIQUIS 5 MG TABS tablet TAKE 1 TABLET TWICE DAILY. 180 tablet 2  . Omega-3 Fatty Acids (FISH OIL) 1200 MG CAPS Take 1,000 mg by mouth 2 (two) times daily.     . simvastatin (ZOCOR) 80 MG tablet Take 1 tablet (80 mg total) by mouth at bedtime. PLEASE MAKE APPOINTMENT FOR FURTHER REFILLS 90 tablet 0  . cephALEXin (KEFLEX) 500 MG capsule Take 1 capsule (500 mg total) by mouth 4 (four) times daily. 21 capsule 0  . metoprolol succinate (TOPROL-XL) 25 MG 24 hr tablet TAKE 1 TABLET EACH DAY. (Patient taking differently: Take 12.5 mg by mouth daily. ) 90 tablet 1   No facility-administered medications prior to visit.    Past Medical History:  Diagnosis Date  .  A-fib ([place])   . Allergy    Seasonal--pollen  . Anticoagulation goal of INR 2 to 3    on coumadin  . CAD (coronary artery disease) 1999   RCA stent  . Carotid stenosis, asymptomatic    moderate RT and mild LT with carotid dopplers 10/26/11  . Colon cancer ([place]) 04/2006   Stage III--T3 N1  . COPD (chronic obstructive pulmonary disease) ([place])   . H/O cardiovascular stress test 01/13/2010   low risk scan, similar to 2008 study  . H/O echocardiogram 05/23/2009   EF >55%, mild MR, Mild TR,   . Hand foot syndrome   . Hemorrhoids   . Hyperlipidemia   . Hypertension   . Neuropathy   . Permanent atrial fibrillation ([place])   . Psoriasis   . Tubular adenoma of colon    Past Surgical History:  Procedure Laterality Date  . COLONOSCOPY    . CORONARY STENT PLACEMENT  1999   RCA tandem NIR stents  . RIGHT COLECTOMY  05/29/2006   Laparoscopic assisted  . TONSILLECTOMY     Social History   Social History  . Marital status: Married    Spouse name: [name]  . Number of children: 2  . Years of education: N/A   Occupational History  . retired Retired   Social History Main Topics  . Smoking status: Former Smoker    Years: 30.00  Quit date: 11/25/1993  . Smokeless tobacco: Never Used  . Alcohol use Yes     Comment: social drinking  . Drug use: No  . Sexual activity: Not Asked   Other Topics Concern  . None   Social History Narrative  . None   Family History  Problem Relation Age of Onset  . Heart disease Father 53  . Heart disease Paternal Grandmother   . Heart disease Mother   . Hypertension Mother   . Healthy Sister   . Heart disease Paternal Grandfather   . Hypertension Paternal Grandfather   . Colon cancer Neg Hx      Review of Systems: Pertinent positive and negative review of systems were noted in the above HPI section. All other review of systems were otherwise negative.   Physical Exam: General: Well developed, well nourished, no acute distress Head:  Normocephalic and atraumatic Eyes:  sclerae anicteric, EOMI Ears: Normal auditory acuity Mouth: No deformity or lesions Neck: Supple, no masses or thyromegaly Lungs: Clear throughout to auscultation Heart: Regular rate and rhythm; no murmurs, rubs or bruits Abdomen: Soft, non tender and non distended. No masses, hepatosplenomegaly or hernias noted. Normal Bowel sounds Rectal: Deferred to colonoscopy Musculoskeletal: Symmetrical with no gross deformities  Skin: No lesions on visible extremities Pulses:  Normal pulses noted Extremities: No clubbing, cyanosis, edema or deformities noted Neurological: Alert oriented x 4, grossly nonfocal Cervical Nodes:  No significant cervical adenopathy Inguinal Nodes: No significant inguinal adenopathy Psychological:  Alert and cooperative. Normal mood and affect  Assessment and Recommendations:  1. Personal history of stage III colon cancer in 2008, recurrent adenomatous colon polyps. He is due for a 3 year interval surveillance colonoscopy. Schedule colonoscopy. The risks (including bleeding, perforation, infection, missed lesions, medication reactions and possible hospitalization or surgery if complications occur), benefits, and alternatives to colonoscopy with possible biopsy and possible polypectomy were discussed with the patient and they consent to proceed.   2. Hold Eliquis 2 days before procedure - will instruct when and how to resume after procedure. Low but real risk of cardiovascular event such as heart attack, stroke, embolism, thrombosis or ischemia/infarct of other organs off Eliquis explained and need to seek urgent help if this occurs. The patient consents to proceed. Will communicate by phone or EMR with patient's prescribing provider to confirm that holding Eliquis is reasonable in this case.

## 2016-10-18 NOTE — Telephone Encounter (Signed)
   GUERRY COVINGTON 01/28/1941 [id number]  Dear Erasmo Downer Alvstad,RPH-CPP:  We have scheduled the above named patient for a(n) Colonoscopy procedure. Our records show that (s)he is on anticoagulation therapy.  Please advise as to whether the patient may come off their therapy of Eliquis 2 days prior to their procedure which is scheduled for 12/27/16.  Please route your response to Marlon Pel, CMA or fax response to [phone].  Sincerely,    [facility]

## 2016-10-18 NOTE — Telephone Encounter (Signed)
Patient notified to hold Eliquis 2 days before his procedure. Patient verbalized understanding.

## 2016-10-18 NOTE — Telephone Encounter (Signed)
Returned call. Unable to reach anyone at the number provided.

## 2016-10-19 NOTE — Telephone Encounter (Signed)
Spoke with [place], [facility] @ [facility]. She needs clarification on simvastatin as max recommended daily dose is 40mg  unless patient has documentation that higher dose is tolerated. Explained that patient has been on simvastatin 80mg  daily since at least 2014.

## 2016-12-03 ENCOUNTER — Other Ambulatory Visit: Payer: Self-pay | Admitting: Internal Medicine

## 2016-12-03 ENCOUNTER — Ambulatory Visit
Admission: RE | Admit: 2016-12-03 | Discharge: 2016-12-03 | Disposition: A | Discharge status: Home / Self Care | Payer: Medicare Other | Source: Ambulatory Visit | Attending: Internal Medicine | Admitting: Internal Medicine

## 2016-12-03 DIAGNOSIS — J219 Acute bronchiolitis, unspecified: Secondary | ICD-10-CM

## 2016-12-07 ENCOUNTER — Other Ambulatory Visit: Payer: Self-pay | Admitting: Cardiovascular Disease

## 2016-12-14 ENCOUNTER — Encounter: Payer: Self-pay | Admitting: Gastroenterology

## 2016-12-27 ENCOUNTER — Ambulatory Visit (AMBULATORY_SURGERY_CENTER): Payer: Medicare Other | Admitting: Gastroenterology

## 2016-12-27 ENCOUNTER — Encounter: Payer: Self-pay | Admitting: Gastroenterology

## 2016-12-27 VITALS — BP 118/57 | HR 48 | Temp 96.9°F | Resp 10 | Ht 75.0 in | Wt 233.0 lb

## 2016-12-27 DIAGNOSIS — D129 Benign neoplasm of anus and anal canal: Secondary | ICD-10-CM

## 2016-12-27 DIAGNOSIS — K621 Rectal polyp: Secondary | ICD-10-CM | POA: Diagnosis not present

## 2016-12-27 DIAGNOSIS — Z85038 Personal history of other malignant neoplasm of large intestine: Secondary | ICD-10-CM

## 2016-12-27 DIAGNOSIS — D128 Benign neoplasm of rectum: Secondary | ICD-10-CM

## 2016-12-27 MED ORDER — SODIUM CHLORIDE 0.9 % IV SOLN: 500.0000 mL | INTRAVENOUS | Status: DC

## 2016-12-27 NOTE — Addendum Note (Signed)
Addended by: Ernestine Conrad D on: 12/27/2016 09:11 AM   Modules accepted: Orders

## 2016-12-27 NOTE — Progress Notes (Signed)
Pt's states no medical or surgical changes since previsit or office visit. 

## 2016-12-27 NOTE — Progress Notes (Signed)
Report given to PACU, vss 

## 2016-12-27 NOTE — Op Note (Signed)
[facility] Patient Name: [name] Procedure Date: 12/27/2016 7:57 AM MRN: [id number] Endoscopist: Ladene Artist , MD Age: 76 Referring MD:  Date of Birth: [birth year] Gender: Male Account #: [id number] Procedure:                Colonoscopy Indications:              High risk colon cancer surveillance: Personal                            history of colon cancer Medicines:                Monitored Anesthesia Care Procedure:                Pre-Anesthesia Assessment:                           - Prior to the procedure, a History and Physical                            was performed, and patient medications and                            allergies were reviewed. The patient's tolerance of                            previous anesthesia was also reviewed. The risks                            and benefits of the procedure and the sedation                            options and risks were discussed with the patient.                            All questions were answered, and informed consent                            was obtained. Prior Anticoagulants: The patient has                            taken Eliquis (apixaban), last dose was 2 days                            prior to procedure. ASA Grade Assessment: III - A                            patient with severe systemic disease. After                            reviewing the risks and benefits, the patient was                            deemed in satisfactory condition to undergo the  procedure.                           After obtaining informed consent, the colonoscope                            was passed under direct vision. Throughout the                            procedure, the patient's blood pressure, pulse, and                            oxygen saturations were monitored continuously. The                            Colonoscope was introduced through the anus and    advanced to the the cecum, identified by                            appendiceal orifice and ileocecal valve. The rectum                            was photographed. The quality of the bowel                            preparation was adequate. The colonoscopy was                            performed without difficulty. The patient tolerated                            the procedure well. Scope In: 8:08:07 AM Scope Out: 8:25:03 AM Scope Withdrawal Time: 0 hours 14 minutes 17 seconds  Total Procedure Duration: 0 hours 16 minutes 56 seconds  Findings:                 The perianal and digital rectal examinations were                            normal.                           A 5 mm polyp was found in the rectum. The polyp was                            sessile. The polyp was removed with a cold snare.                            Resection and retrieval were complete. Persistent                            oozing following routine polypectomy. To stop                            active bleeding, one hemostatic clip was  successfully placed (MR conditional). There was no                            bleeding at the end of the procedure.                           There was evidence of a prior end-to-side                            ileo-colonic anastomosis in the ascending colon.                            Right hemicolectomy. This was patent and was                            characterized by healthy appearing mucosa. The                            anastomosis was not traversed.                           Internal hemorrhoids were found during                            retroflexion. The hemorrhoids were small and Grade                            I (internal hemorrhoids that do not prolapse).                           The exam was otherwise normal throughout the                            examined colon. Complications:            No immediate complications. Estimated blood  loss:                            Minimal. Estimated Blood Loss:     Estimated blood loss: none. Impression:               - One 5 mm polyp in the rectum, removed with a cold                            snare. Resected and retrieved. Clip (MR                            conditional) was placed for hemostasis.                           - Right hemicolectomy. Patent end-to-side                            ileo-colonic anastomosis, characterized by healthy  appearing mucosa.                           - Small internal hemorrhoids. Recommendation:           - Repeat colonoscopy in 3 years for surveillance.                           - Resume Eliquis (apixaban) in 3 days at prior                            dose. Refer to managing physician for further                            adjustment of therapy.                           - Patient has a contact number available for                            emergencies. The signs and symptoms of potential                            delayed complications were discussed with the                            patient. Return to normal activities tomorrow.                            Written discharge instructions were provided to the                            patient.                           - Resume previous diet.                           - Continue present medications.                           - Await pathology results.                           - No aspirin, ibuprofen, naproxen, or other                            non-steroidal anti-inflammatory drugs for 2 weeks                            after polyp removal. Ladene Artist, MD 12/27/2016 8:32:58 AM This report has been signed electronically.

## 2016-12-27 NOTE — Patient Instructions (Signed)
YOU HAD AN ENDOSCOPIC PROCEDURE TODAY AT [facility]:   Refer to the procedure report that was given to you for any specific questions about what was found during the examination.  If the procedure report does not answer your questions, please call your gastroenterologist to clarify.  If you requested that your care partner not be given the details of your procedure findings, then the procedure report has been included in a sealed envelope for you to review at your convenience later.  YOU SHOULD EXPECT: Some feelings of bloating in the abdomen. Passage of more gas than usual.  Walking can help get rid of the air that was put into your GI tract during the procedure and reduce the bloating. If you had a lower endoscopy (such as a colonoscopy or flexible sigmoidoscopy) you may notice spotting of blood in your stool or on the toilet paper. If you underwent a bowel prep for your procedure, you may not have a normal bowel movement for a few days.  Please Note:  You might notice some irritation and congestion in your nose or some drainage.  This is from the oxygen used during your procedure.  There is no need for concern and it should clear up in a day or so.  SYMPTOMS TO REPORT IMMEDIATELY:   Following lower endoscopy (colonoscopy or flexible sigmoidoscopy):  Excessive amounts of blood in the stool  Significant tenderness or worsening of abdominal pains  Swelling of the abdomen that is new, acute  Fever of 100F or higher   For urgent or emergent issues, a gastroenterologist can be reached at any hour by calling [phone].   DIET:  We do recommend a small meal at first, but then you may proceed to your regular diet.  Drink plenty of fluids but you should avoid alcoholic beverages for 24 hours.  ACTIVITY:  You should plan to take it easy for the rest of today and you should NOT DRIVE or use heavy machinery until tomorrow (because of the sedation medicines used during the test).     FOLLOW UP: Our staff will call the number listed on your records the next business day following your procedure to check on you and address any questions or concerns that you may have regarding the information given to you following your procedure. If we do not reach you, we will leave a message.  However, if you are feeling well and you are not experiencing any problems, there is no need to return our call.  We will assume that you have returned to your regular daily activities without incident.  If any biopsies were taken you will be contacted by phone or by letter within the next 1-3 weeks.  Please call us at [phone] if you have not heard about the biopsies in 3 weeks.    SIGNATURES/CONFIDENTIALITY: You and/or your care partner have signed paperwork which will be entered into your electronic medical record.  These signatures attest to the fact that that the information above on your After Visit Summary has been reviewed and is understood.  Full responsibility of the confidentiality of this discharge information lies with you and/or your care-partner.  Resume your Eliquis in three days per Dr. Fuller Plan.  Youwill need another colonoscopy in three years. Clip card given.

## 2016-12-27 NOTE — Progress Notes (Signed)
Called to room to assist during endoscopic procedure.  Patient ID and intended procedure confirmed with present staff. Received instructions for my participation in the procedure from the performing physician.  

## 2016-12-28 ENCOUNTER — Telehealth: Payer: Self-pay | Admitting: *Deleted

## 2016-12-28 NOTE — Telephone Encounter (Signed)
No answer for post procedure call back just rang busy. SM

## 2016-12-28 NOTE — Telephone Encounter (Signed)
  Follow up Call-  Call back number 12/27/2016  Post procedure Call Back phone  # [phone]  Permission to leave phone message Yes  Some recent data might be hidden    [facility]

## 2016-12-31 ENCOUNTER — Other Ambulatory Visit: Payer: Self-pay | Admitting: Cardiovascular Disease

## 2016-12-31 ENCOUNTER — Telehealth: Payer: Self-pay | Admitting: *Deleted

## 2016-12-31 NOTE — Telephone Encounter (Signed)
  Follow up Call-  Call back number 12/27/2016  Post procedure Call Back phone  # [phone]  Permission to leave phone message Yes  Some recent data might be hidden     Patient questions:  Do you have a fever, pain , or abdominal swelling? No. Pain Score  0 *  Have you tolerated food without any problems? Yes.    Have you been able to return to your normal activities? Yes.    Do you have any questions about your discharge instructions: Diet   No. Medications  No. Follow up visit  No.  Do you have questions or concerns about your Care? No.  Actions: * If pain score is 4 or above: No action needed, pain <4.

## 2017-01-07 ENCOUNTER — Other Ambulatory Visit: Payer: Self-pay | Admitting: *Deleted

## 2017-01-09 ENCOUNTER — Encounter: Payer: Self-pay | Admitting: Gastroenterology

## 2017-01-10 ENCOUNTER — Ambulatory Visit (HOSPITAL_COMMUNITY)
Admission: RE | Admit: 2017-01-10 | Discharge: 2017-01-10 | Disposition: A | Discharge status: Home / Self Care | Payer: Medicare Other | Source: Ambulatory Visit | Attending: Cardiovascular Disease | Admitting: Cardiovascular Disease

## 2017-01-10 DIAGNOSIS — I6523 Occlusion and stenosis of bilateral carotid arteries: Secondary | ICD-10-CM | POA: Insufficient documentation

## 2017-01-16 ENCOUNTER — Other Ambulatory Visit: Payer: Self-pay | Admitting: Cardiovascular Disease

## 2017-01-16 DIAGNOSIS — I6523 Occlusion and stenosis of bilateral carotid arteries: Secondary | ICD-10-CM

## 2017-01-18 ENCOUNTER — Encounter: Payer: Self-pay | Admitting: Cardiovascular Disease

## 2017-01-18 ENCOUNTER — Ambulatory Visit (INDEPENDENT_AMBULATORY_CARE_PROVIDER_SITE_OTHER): Payer: Medicare Other | Admitting: Cardiovascular Disease

## 2017-01-18 VITALS — BP 150/70 | HR 57 | Ht 75.0 in | Wt 229.8 lb

## 2017-01-18 DIAGNOSIS — E7800 Pure hypercholesterolemia, unspecified: Secondary | ICD-10-CM

## 2017-01-18 DIAGNOSIS — I6521 Occlusion and stenosis of right carotid artery: Secondary | ICD-10-CM

## 2017-01-18 DIAGNOSIS — I4821 Permanent atrial fibrillation: Secondary | ICD-10-CM

## 2017-01-18 DIAGNOSIS — I10 Essential (primary) hypertension: Secondary | ICD-10-CM | POA: Diagnosis not present

## 2017-01-18 DIAGNOSIS — I482 Chronic atrial fibrillation: Secondary | ICD-10-CM

## 2017-01-18 DIAGNOSIS — I2510 Atherosclerotic heart disease of native coronary artery without angina pectoris: Secondary | ICD-10-CM

## 2017-01-18 NOTE — Assessment & Plan Note (Signed)
History of essential hypertension with blood pressure measured at 181/83. He is on metoprolol. Continue current meds at current dosing

## 2017-01-18 NOTE — Assessment & Plan Note (Signed)
History of permanent atrial fibrillation rate controlled on Eliquis oral anticoagulation 

## 2017-01-18 NOTE — Assessment & Plan Note (Signed)
History of moderate right and mild left ICA stenosis recently checked by duplex ultrasound 01/12/17. Will repeat this in one year.

## 2017-01-18 NOTE — Progress Notes (Signed)
01/18/2017 [place]   21-Jan-1941  [id number]  Primary Physician Lavone Orn, MD Primary Cardiologist: Lorretta Harp MD Garret Reddish, [place], [place]  HPI:  [name] is a 76 y.o. male  mildly overweight, married Caucasian male father of 2, grandfather of 3 grandchildren who I saw 12/28/15. He has a history of CAD status post RCA stenting by myself January 11, 1998. He had normal circumflex, LAD and ejection fraction. His other problems include hypertension and chronic atrial fibrillation on Coumadin anticoagulation, rate controlled, as well as erectile dysfunction. He is totally asymptomatic. He did have colon cancer picked up on screening colonoscopy and underwent right colectomy May 29, 2006 followed by 11 cycles of adjuvant Folfox chemotherapy. [name] He is very active exercises 6 days a week doing weight training 3 days a week, and lower extremity training 3 times a week.Marland KitchenHe is completely asymptomatic   Current Meds  Medication Sig  . Alpha Lipoic Acid 200 MG CAPS Take 200 mg by mouth daily.   [name] aspirin 81 MG tablet Take 81 mg by mouth daily.  . cholecalciferol (VITAMIN D) 1000 UNITS tablet Take 1,000 Units by mouth daily.  . Coenzyme Q10 200 MG capsule Take 200 mg by mouth daily.  . Cyanocobalamin (VITAMIN B 12 PO) Take 1 tablet by mouth daily.   [name] ELIQUIS 5 MG TABS tablet TAKE 1 TABLET TWICE DAILY.  . metoprolol succinate (TOPROL-XL) 25 MG 24 hr tablet Take 12.5 mg by mouth daily.  . Omega-3 Fatty Acids (FISH OIL) 1200 MG CAPS Take 1,000 mg by mouth 2 (two) times daily.   . simvastatin (ZOCOR) 80 MG tablet TAKE 1 TABLET BY MOUTH AT  BEDTIME.     No Known Allergies  Social History   Social History  . Marital status: Married    Spouse name: [name]  . Number of children: 2  . Years of education: N/A   Occupational History  . retired Retired   Social History Main Topics  . Smoking status: Former Smoker    Years: 30.00    Quit date: 11/25/1993  . Smokeless  tobacco: Never Used  . Alcohol use Yes     Comment: social drinking  . Drug use: No  . Sexual activity: Not on file   Other Topics Concern  . Not on file   Social History Narrative  . No narrative on file     Review of Systems: General: negative for chills, fever, night sweats or weight changes.  Cardiovascular: negative for chest pain, dyspnea on exertion, edema, orthopnea, palpitations, paroxysmal nocturnal dyspnea or shortness of breath Dermatological: negative for rash Respiratory: negative for cough or wheezing Urologic: negative for hematuria Abdominal: negative for nausea, vomiting, diarrhea, bright red blood per rectum, melena, or hematemesis Neurologic: negative for visual changes, syncope, or dizziness All other systems reviewed and are otherwise negative except as noted above.    Blood pressure (!) 181/83, pulse (!) 57, height 6\' 3"  (1.905 m), weight 229 lb 12.8 oz (104.2 kg).  General appearance: alert and no distress Neck: no adenopathy, no carotid bruit, no JVD, supple, symmetrical, trachea midline and thyroid not enlarged, symmetric, no tenderness/mass/nodules Lungs: clear to auscultation bilaterally Heart: regular rate and rhythm, S1, S2 normal, no murmur, click, rub or gallop Extremities: extremities normal, atraumatic, no cyanosis or edema Pulses: 2+ and symmetric Skin: Skin color, texture, turgor normal. No rashes or lesions Neurologic: Alert and oriented X 3, normal strength and tone. Normal symmetric reflexes. Normal  coordination and gait  EKG atrial fibrillation with a ventricular response of 57. I personally reviewed this EKG.  ASSESSMENT AND PLAN:   Permanent atrial fibrillation History of permanent atrial fibrillation rate controlled on Eliquis  oral anticoagulation.  Carotid stenosis, asymptomatic History of moderate right and mild left ICA stenosis recently checked by duplex ultrasound 01/12/17. Will repeat this in one year.  CAD (coronary  artery disease) History of CAD status post RCA stenting by myself 01/11/98. Normal circumflex LAD and normal LV function. He denies chest pain or shortness of breath.  Essential hypertension History of essential hypertension with blood pressure measured at 181/83. He is on metoprolol. Continue current meds at current dosing  Hyperlipidemia History of hyperlipidemia on statin therapy followed by PCP.      Lorretta Harp MD FACP,FACC,FAHA, [facility] 01/18/2017 10:50 AM

## 2017-01-18 NOTE — Assessment & Plan Note (Signed)
History of hyperlipidemia on statin therapy followed by PCP 

## 2017-01-18 NOTE — Patient Instructions (Signed)

## 2017-01-18 NOTE — Assessment & Plan Note (Signed)
History of CAD status post RCA stenting by myself 01/11/98. Normal circumflex LAD and normal LV function. He denies chest pain or shortness of breath.

## 2017-01-24 ENCOUNTER — Other Ambulatory Visit: Payer: Self-pay | Admitting: Cardiovascular Disease

## 2017-02-19 ENCOUNTER — Other Ambulatory Visit: Payer: Self-pay | Admitting: Cardiovascular Disease

## 2017-04-03 ENCOUNTER — Other Ambulatory Visit: Payer: Self-pay | Admitting: Cardiovascular Disease

## 2017-04-03 NOTE — Telephone Encounter (Signed)
REFILL 

## 2017-07-03 ENCOUNTER — Other Ambulatory Visit: Payer: Self-pay | Admitting: Cardiovascular Disease

## 2018-01-01 ENCOUNTER — Other Ambulatory Visit: Payer: Self-pay | Admitting: Cardiovascular Disease

## 2018-01-13 ENCOUNTER — Ambulatory Visit (HOSPITAL_COMMUNITY)
Admission: RE | Admit: 2018-01-13 | Discharge: 2018-01-13 | Disposition: A | Discharge status: Home / Self Care | Payer: Medicare Other | Source: Ambulatory Visit | Attending: Cardiovascular Disease | Admitting: Cardiovascular Disease

## 2018-01-13 DIAGNOSIS — I6523 Occlusion and stenosis of bilateral carotid arteries: Secondary | ICD-10-CM | POA: Insufficient documentation

## 2018-01-14 ENCOUNTER — Other Ambulatory Visit: Payer: Self-pay | Admitting: *Deleted

## 2018-01-14 DIAGNOSIS — I6523 Occlusion and stenosis of bilateral carotid arteries: Secondary | ICD-10-CM

## 2018-01-15 ENCOUNTER — Other Ambulatory Visit: Payer: Self-pay | Admitting: *Deleted

## 2018-01-15 MED ORDER — SIMVASTATIN 80 MG PO TABS: 80.0000 mg | ORAL_TABLET | Freq: Every day | ORAL | 0 refills | Status: DC

## 2018-01-21 ENCOUNTER — Encounter (HOSPITAL_COMMUNITY): Payer: Self-pay | Admitting: Emergency Medicine

## 2018-01-21 ENCOUNTER — Emergency Department (HOSPITAL_COMMUNITY): Payer: Medicare Other

## 2018-01-21 ENCOUNTER — Inpatient Hospital Stay (HOSPITAL_COMMUNITY)
Admission: EM | Admit: 2018-01-21 | Discharge: 2018-02-03 | DRG: 234 | Disposition: A | Discharge status: Home / Self Care | Payer: Medicare Other | Attending: Surgery | Admitting: Surgery

## 2018-01-21 DIAGNOSIS — I4821 Permanent atrial fibrillation: Secondary | ICD-10-CM | POA: Diagnosis present

## 2018-01-21 DIAGNOSIS — F419 Anxiety disorder, unspecified: Secondary | ICD-10-CM | POA: Diagnosis not present

## 2018-01-21 DIAGNOSIS — R0789 Other chest pain: Secondary | ICD-10-CM | POA: Diagnosis not present

## 2018-01-21 DIAGNOSIS — Z7901 Long term (current) use of anticoagulants: Secondary | ICD-10-CM | POA: Diagnosis not present

## 2018-01-21 DIAGNOSIS — E7800 Pure hypercholesterolemia, unspecified: Secondary | ICD-10-CM | POA: Diagnosis not present

## 2018-01-21 DIAGNOSIS — I2510 Atherosclerotic heart disease of native coronary artery without angina pectoris: Secondary | ICD-10-CM | POA: Diagnosis present

## 2018-01-21 DIAGNOSIS — Z955 Presence of coronary angioplasty implant and graft: Secondary | ICD-10-CM | POA: Diagnosis not present

## 2018-01-21 DIAGNOSIS — I8393 Asymptomatic varicose veins of bilateral lower extremities: Secondary | ICD-10-CM | POA: Diagnosis not present

## 2018-01-21 DIAGNOSIS — I6529 Occlusion and stenosis of unspecified carotid artery: Secondary | ICD-10-CM | POA: Diagnosis present

## 2018-01-21 DIAGNOSIS — K5900 Constipation, unspecified: Secondary | ICD-10-CM | POA: Diagnosis not present

## 2018-01-21 DIAGNOSIS — I454 Nonspecific intraventricular block: Secondary | ICD-10-CM | POA: Diagnosis present

## 2018-01-21 DIAGNOSIS — R001 Bradycardia, unspecified: Secondary | ICD-10-CM | POA: Diagnosis not present

## 2018-01-21 DIAGNOSIS — J449 Chronic obstructive pulmonary disease, unspecified: Secondary | ICD-10-CM | POA: Diagnosis present

## 2018-01-21 DIAGNOSIS — I25110 Atherosclerotic heart disease of native coronary artery with unstable angina pectoris: Secondary | ICD-10-CM | POA: Diagnosis not present

## 2018-01-21 DIAGNOSIS — Z951 Presence of aortocoronary bypass graft: Secondary | ICD-10-CM | POA: Diagnosis not present

## 2018-01-21 DIAGNOSIS — Z87891 Personal history of nicotine dependence: Secondary | ICD-10-CM | POA: Diagnosis not present

## 2018-01-21 DIAGNOSIS — Z7982 Long term (current) use of aspirin: Secondary | ICD-10-CM | POA: Diagnosis not present

## 2018-01-21 DIAGNOSIS — Z9049 Acquired absence of other specified parts of digestive tract: Secondary | ICD-10-CM | POA: Diagnosis not present

## 2018-01-21 DIAGNOSIS — Z23 Encounter for immunization: Secondary | ICD-10-CM | POA: Diagnosis present

## 2018-01-21 DIAGNOSIS — E8770 Fluid overload, unspecified: Secondary | ICD-10-CM | POA: Diagnosis not present

## 2018-01-21 DIAGNOSIS — D62 Acute posthemorrhagic anemia: Secondary | ICD-10-CM | POA: Diagnosis not present

## 2018-01-21 DIAGNOSIS — I214 Non-ST elevation (NSTEMI) myocardial infarction: Principal | ICD-10-CM | POA: Diagnosis present

## 2018-01-21 DIAGNOSIS — Z8249 Family history of ischemic heart disease and other diseases of the circulatory system: Secondary | ICD-10-CM

## 2018-01-21 DIAGNOSIS — Z85038 Personal history of other malignant neoplasm of large intestine: Secondary | ICD-10-CM | POA: Diagnosis not present

## 2018-01-21 DIAGNOSIS — I272 Pulmonary hypertension, unspecified: Secondary | ICD-10-CM | POA: Diagnosis present

## 2018-01-21 DIAGNOSIS — E785 Hyperlipidemia, unspecified: Secondary | ICD-10-CM | POA: Diagnosis not present

## 2018-01-21 DIAGNOSIS — Z79899 Other long term (current) drug therapy: Secondary | ICD-10-CM

## 2018-01-21 DIAGNOSIS — K30 Functional dyspepsia: Secondary | ICD-10-CM | POA: Diagnosis present

## 2018-01-21 DIAGNOSIS — G629 Polyneuropathy, unspecified: Secondary | ICD-10-CM | POA: Diagnosis not present

## 2018-01-21 DIAGNOSIS — I25118 Atherosclerotic heart disease of native coronary artery with other forms of angina pectoris: Secondary | ICD-10-CM | POA: Diagnosis not present

## 2018-01-21 DIAGNOSIS — I10 Essential (primary) hypertension: Secondary | ICD-10-CM | POA: Diagnosis present

## 2018-01-21 DIAGNOSIS — Z01810 Encounter for preprocedural cardiovascular examination: Secondary | ICD-10-CM | POA: Diagnosis not present

## 2018-01-21 DIAGNOSIS — I482 Chronic atrial fibrillation, unspecified: Secondary | ICD-10-CM | POA: Diagnosis not present

## 2018-01-21 DIAGNOSIS — E782 Mixed hyperlipidemia: Secondary | ICD-10-CM | POA: Diagnosis not present

## 2018-01-21 LAB — COMPREHENSIVE METABOLIC PANEL
ALBUMIN: 4.2 g/dL (ref 3.5–5.0)
ALT: 46 U/L — AB (ref 0–44)
AST: 43 U/L — AB (ref 15–41)
Alkaline Phosphatase: 60 U/L (ref 38–126)
Anion gap: 11 (ref 5–15)
BUN: 21 mg/dL (ref 8–23)
CHLORIDE: 105 mmol/L (ref 98–111)
CO2: 24 mmol/L (ref 22–32)
CREATININE: 0.85 mg/dL (ref 0.61–1.24)
Calcium: 9.2 mg/dL (ref 8.9–10.3)
GFR calc Af Amer: >60 mL/min (ref >60)
GLUCOSE: 104 mg/dL — AB (ref 70–99)
POTASSIUM: 4 mmol/L (ref 3.5–5.1)
SODIUM: 140 mmol/L (ref 135–145)
Total Bilirubin: 1.1 mg/dL (ref 0.3–1.2)
Total Protein: 6.6 g/dL (ref 6.5–8.1)

## 2018-01-21 LAB — CBC
HEMATOCRIT: 47.5 % (ref 39.0–52.0)
HEMOGLOBIN: 15.5 g/dL (ref 13.0–17.0)
MCH: 32.2 pg (ref 26.0–34.0)
MCHC: 32.6 g/dL (ref 30.0–36.0)
MCV: 98.5 fL (ref 80.0–100.0)
NRBC: 0 % (ref 0.0–0.2)
Platelets: 183 10*3/uL (ref 150–400)
RBC: 4.82 MIL/uL (ref 4.22–5.81)
RDW: 12.4 % (ref 11.5–15.5)
WBC: 7.5 10*3/uL (ref 4.0–10.5)

## 2018-01-21 LAB — I-STAT TROPONIN, ED: TROPONIN I, POC: 0.06 ng/mL (ref 0.00–0.08)

## 2018-01-21 LAB — D-DIMER, QUANTITATIVE: D-Dimer, Quant: 0.28 ug/mL-FEU (ref 0.00–0.50)

## 2018-01-21 LAB — LIPASE, BLOOD: LIPASE: 35 U/L (ref 11–51)

## 2018-01-21 MED ORDER — PNEUMOCOCCAL VAC POLYVALENT 25 MCG/0.5ML IJ INJ: 0.5000 mL | INJECTION | INTRAMUSCULAR | Status: DC

## 2018-01-21 MED ORDER — ASPIRIN 81 MG PO CHEW
324.0000 mg | CHEWABLE_TABLET | Freq: Once | ORAL | Status: AC
  Administered 2018-01-21: 324 mg via ORAL
  Filled 2018-01-21: qty 4

## 2018-01-21 MED ORDER — INFLUENZA VAC SPLIT HIGH-DOSE 0.5 ML IM SUSY
0.5000 mL | PREFILLED_SYRINGE | INTRAMUSCULAR | Status: DC
  Filled 2018-01-21: qty 0.5

## 2018-01-21 MED ORDER — HEPARIN (PORCINE) IN NACL 100-0.45 UNIT/ML-% IJ SOLN
1600.0000 [IU]/h | INTRAMUSCULAR | Status: DC
  Administered 2018-01-21: 1100 [IU]/h via INTRAVENOUS
  Administered 2018-01-22: 1600 [IU]/h via INTRAVENOUS
  Filled 2018-01-21 (×3): qty 250

## 2018-01-21 NOTE — ED Notes (Addendum)
I gave critical I stat Troponin result to MD Curatolo results was 0.43

## 2018-01-21 NOTE — ED Triage Notes (Signed)
Pt c/o SOB and "indegestion" for couple days. Today pains wont go away and knows something isnt right. Reports had stent placed 20+years ago.

## 2018-01-21 NOTE — Progress Notes (Signed)
ANTICOAGULATION CONSULT NOTE - Initial Consult  Pharmacy Consult for heparin Indication: chest pain/ACS  No Known Allergies  Patient Measurements: Height: 6\' 3"  (190.5 cm) Weight: 201 lb (91.2 kg) IBW/kg (Calculated) : 84.5 Heparin Dosing Weight:   Vital Signs: Temp: 98.6 F (37 C) (11/05 1807) Temp Source: Oral (11/05 1807) BP: 132/68 (11/05 1847) Pulse Rate: 57 (11/05 2200)  Labs: Recent Labs    01/21/18 1844 01/21/18 1920  HGB 15.5  --   HCT 47.5  --   PLT 183  --   CREATININE  --  0.85    Estimated Creatinine Clearance: 91.1 mL/min (by C-G formula based on SCr of 0.85 mg/dL).   Medical History: Past Medical History:  Diagnosis Date  . A-fib ([place])   . Allergy    Seasonal--pollen  . Anticoagulation goal of INR 2 to 3    on coumadin  . CAD (coronary artery disease) 1999   RCA stent  . Carotid stenosis, asymptomatic    moderate RT and mild LT with carotid dopplers 10/26/11  . Colon cancer ([place]) 04/2006   Stage III--T3 N1  . COPD (chronic obstructive pulmonary disease) ([place])   . H/O cardiovascular stress test 01/13/2010   low risk scan, similar to 2008 study  . H/O echocardiogram 05/23/2009   EF >55%, mild MR, Mild TR,   . Hand foot syndrome   . Hemorrhoids   . Hyperlipidemia   . Hypertension   . Neuropathy   . Permanent atrial fibrillation   . Psoriasis   . Tubular adenoma of colon     Medications:   (Not in a hospital admission) Infusions:  . heparin      Assessment: Patient with NSTEMI.  Patient with apixaban prior to admission with last dose noted per med rec 11/5 at 1700.  Spoke with ED MD and wished to proceed with heparin drip without bolus.  Goal of Therapy:  Heparin level 0.3-0.7 units/ml aPTT 66-102 seconds Monitor platelets by anticoagulation protocol: Yes   Plan:  No heparin bolus due to prior apixaban Heparin drip at 1100 units/hr Daily CBC Next heparin level at 0800    Tyler Deis, She[facility] Stakes [place] 01/21/2018,10:38 PM

## 2018-01-21 NOTE — ED Provider Notes (Signed)
[facility] DEPT Provider Note   CSN: [id number] Arrival date & time: 01/21/18  1804     History   Chief Complaint Chief Complaint  Patient presents with  . Shortness of Breath  . chest pain    HPI [name] is a 77 y.o. male.  The history is provided by the patient.  Chest Pain   This is a new problem. The current episode started more than 2 days ago. The problem occurs daily. The problem has been resolved. The pain is associated with exertion. The pain is present in the substernal region. The pain is at a severity of 3/10. The pain is moderate. The quality of the pain is described as pressure-like. The pain does not radiate. Pertinent negatives include no abdominal pain, no back pain, no cough, no fever, no palpitations, no shortness of breath and no vomiting. He has tried rest for the symptoms. The treatment provided significant relief.  His past medical history is significant for CAD (s/p stent) and hyperlipidemia.  Pertinent negatives for past medical history include no PE and no seizures.  Procedure history is positive for cardiac catheterization.    Past Medical History:  Diagnosis Date  . A-fib ([place])   . Allergy    Seasonal--pollen  . Anticoagulation goal of INR 2 to 3    on coumadin  . CAD (coronary artery disease) 1999   RCA stent  . Carotid stenosis, asymptomatic    moderate RT and mild LT with carotid dopplers 10/26/11  . Colon cancer ([place]) 04/2006   Stage III--T3 N1  . COPD (chronic obstructive pulmonary disease) ([place])   . H/O cardiovascular stress test 01/13/2010   low risk scan, similar to 2008 study  . H/O echocardiogram 05/23/2009   EF >55%, mild MR, Mild TR,   . Hand foot syndrome   . Hemorrhoids   . Hyperlipidemia   . Hypertension   . Neuropathy   . Permanent atrial fibrillation   . Psoriasis   . Tubular adenoma of colon     Patient Active Problem List   Diagnosis Date Noted  . Hx of adenomatous colonic  polyps 08/19/2013  . Essential hypertension 11/20/2012  . Hyperlipidemia 11/20/2012  . Permanent atrial fibrillation   . Anticoagulation goal of INR 2 to 3   . Carotid stenosis, asymptomatic   . CAD (coronary artery disease)   . 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 COLONOSCOPY    . CORONARY STENT PLACEMENT  1999   RCA tandem NIR stents  . RIGHT COLECTOMY  05/29/2006   Laparoscopic assisted  . TONSILLECTOMY          Home Medications    Prior to Admission medications   Medication Sig Start Date End Date Taking? Authorizing Provider  Alpha Lipoic Acid 200 MG CAPS Take 600 mg by mouth 2 (two) times daily.    Yes [provider]  aspirin 81 MG tablet Take 81 mg by mouth daily.   Yes [provider]  cholecalciferol (VITAMIN D) 1000 UNITS tablet Take 1,000 Units by mouth daily.   Yes [provider]  Coenzyme Q10 200 MG capsule Take 100 mg by mouth 2 (two) times daily.    Yes [provider]  Cyanocobalamin (VITAMIN B 12 PO) Take 100 mg by mouth daily.    Yes [provider]  ELIQUIS 5 MG TABS tablet TAKE 1 TABLET BY MOUTH TWICE  DAILY. 01/01/18  Yes Lorretta Harp, MD  Flaxseed, Linseed, (FLAX SEED OIL PO) Take 1,400 mg by mouth daily.   Yes [provider]  Omega-3 Fatty Acids (FISH OIL) 1200 MG CAPS Take 1,000 mg by mouth daily.    Yes [provider]  simvastatin (ZOCOR) 80 MG tablet Take 1 tablet (80 mg total) by mouth at bedtime. KEEP OV. 01/15/18  Yes Lorretta Harp, MD  vitamin C (ASCORBIC ACID) 500 MG tablet Take 1,000 mg by mouth 2 (two) times daily.   Yes [provider]  metoprolol succinate (TOPROL-XL) 25 MG 24 hr tablet TAKE 1 TABLET BY MOUTH  EVERY DAY Patient not taking: Reported on 01/21/2018 02/19/17   Lorretta Harp, MD    Family History Family History  Problem Relation Age of Onset  . Heart disease Father  78  . Heart disease Paternal Grandmother   . Heart disease Mother   . Hypertension Mother   . Healthy Sister   . Heart disease Paternal Grandfather   . Hypertension Paternal Grandfather   . Colon cancer Neg Hx     Social History Social History   Tobacco Use  . Smoking status: Former Smoker    Years: 30.00    Last attempt to quit: 11/25/1993    Years since quitting: 24.1  . Smokeless tobacco: Never Used  Substance Use Topics  . Alcohol use: Yes    Alcohol/week: 1.0 standard drinks    Types: 1 Cans of beer per week  . Drug use: No     Allergies   Patient has no known allergies.   Review of Systems Review of Systems  Constitutional: Negative for chills and fever.  HENT: Negative for ear pain and sore throat.   Eyes: Negative for pain and visual disturbance.  Respiratory: Negative for cough and shortness of breath.   Cardiovascular: Positive for chest pain. Negative for palpitations.  Gastrointestinal: Negative for abdominal pain and vomiting.  Genitourinary: Negative for dysuria and hematuria.  Musculoskeletal: Negative for arthralgias and back pain.  Skin: Negative for color change and rash.  Neurological: Negative for seizures and syncope.  All other systems reviewed and are negative.    Physical Exam Updated Vital Signs  ED Triage Vitals  Enc Vitals Group     BP 01/21/18 1807 (!) 173/94     Pulse Rate 01/21/18 1807 96     Resp 01/21/18 1807 16     Temp 01/21/18 1807 98.6 F (37 C)     Temp Source 01/21/18 1807 Oral     SpO2 01/21/18 1807 94 %     Weight 01/21/18 1807 201 lb (91.2 kg)     Height 01/21/18 1807 6\' 3"  (1.905 m)     Head Circumference --      Peak Flow --      Pain Score 01/21/18 1844 4     Pain Loc --      Pain Edu? --      Excl. in [place]? --     Physical Exam  Constitutional: He appears well-developed and well-nourished.  HENT:  Head: Normocephalic and atraumatic.  Eyes: Conjunctivae and EOM are normal.  Neck: Normal range of motion.  Neck supple.  Cardiovascular: Normal rate and regular rhythm.  No murmur heard. Pulmonary/Chest: Effort normal and breath sounds normal. No respiratory distress. He has no decreased breath sounds. He has no wheezes. He has no rales.  Abdominal: Soft. There is no tenderness.  Musculoskeletal: He exhibits no edema.  Right lower leg: He exhibits no edema.       Left lower leg: He exhibits no edema.  Neurological: He is alert.  Skin: Skin is warm and dry. Capillary refill takes less than 2 seconds.  Psychiatric: He has a normal mood and affect.  Nursing note and vitals reviewed.    ED Treatments / Results  Labs (all labs ordered are listed, but only abnormal results are displayed) Labs Reviewed  COMPREHENSIVE METABOLIC PANEL - Abnormal; Notable for the following components:      Result Value   Glucose, Bld 104 (*)    AST 43 (*)    ALT 46 (*)    All other components within normal limits  CBC  LIPASE, BLOOD  D-DIMER, QUANTITATIVE (NOT AT [facility])  HEPARIN LEVEL (UNFRACTIONATED)  CBC  I-STAT TROPONIN, ED  I-STAT TROPONIN, ED    EKG EKG Interpretation  Date/Time:  Tuesday January 21 2018 18:14:16 EST Ventricular Rate:  80 PR Interval:    QRS Duration: 100 QT Interval:  438 QTC Calculation: 506 R Axis:   0 Text Interpretation:  Atrial fibrillation Repol abnrm, severe global ischemia (LM/MVD) Prolonged QT interval Confirmed by Lennice Sites [phone]) on 01/21/2018 6:20:01 PM   Radiology Dg Chest 2 View  Result Date: 01/21/2018 CLINICAL DATA:  Shortness of breath EXAM: CHEST - 2 VIEW COMPARISON:  12/03/2016 FINDINGS: No focal opacity or pleural effusion. Cardiomediastinal silhouette within normal limits. No pneumothorax. Aortic atherosclerosis. IMPRESSION: No active cardiopulmonary disease. Electronically Signed   By: Donavan Foil M.D.   On: 01/21/2018 18:52    Procedures .Critical Care Performed by: Lennice Sites, DO Authorized by: Lennice Sites, DO   Critical care  provider statement:    Critical care time (minutes):  40   Critical care was necessary to treat or prevent imminent or life-threatening deterioration of the following conditions:  Cardiac failure   Critical care was time spent personally by me on the following activities:  Discussions with consultants, discussions with primary provider, evaluation of patient's response to treatment, examination of patient, development of treatment plan with patient or surrogate, obtaining history from patient or surrogate, ordering and performing treatments and interventions, ordering and review of laboratory studies, pulse oximetry, re-evaluation of patient's condition, ordering and review of radiographic studies and review of old charts   I assumed direction of critical care for this patient from another provider in my specialty: no     (including critical care time)  Medications Ordered in ED Medications  Influenza vac split quadrivalent PF (FLUZONE HIGH-DOSE) injection 0.5 mL (has no administration in time range)  pneumococcal 23 valent vaccine (PNU-IMMUNE) injection 0.5 mL (has no administration in time range)  heparin ADULT infusion 100 units/mL (25000 units/266mL sodium chloride 0.45%) (has no administration in time range)  aspirin chewable tablet 324 mg (324 mg Oral Given 01/21/18 1913)     Initial Impression / Assessment and Plan / ED Course  I have reviewed the triage vital signs and the nursing notes.  Pertinent labs & imaging results that were available during my care of the patient were reviewed by me and considered in my medical decision making (see chart for details).     [name] is a 77 year old male with history of coronary artery disease status post stent, atrial fibrillation on Eliquis who presents to the ED with shortness of breath.  Patient with unremarkable vitals.  No fever.  EKG shows atrial fibrillation with new T wave inversions throughout.  Concerning for ischemia.  Patient  states that he has had exertional shortness of breath for the last several days.  No symptoms while at rest.  Currently without any chest pain, shortness of breath at rest.  Clear breath sounds on exam.  No signs of volume overload.  No PE or DVT risk factors.  Patient is on Eliquis.  Chest x-ray showed no signs of pneumonia, pneumothorax, pleural effusion.  Initial troponin within normal limits.  No significant anemia, electrolyte abnormality, kidney injury.  Given EKG changes and possible anginal equivalent cardiology was consulted.  They recommend admission to medicine for further cardiac work-up.  However, patient had second troponin drawn that was positive at 0.43 and was started on heparin infusion.  Cardiology was reengaged for admission to their primary service given ACS.  Patient remained asymptomatic throughout my care.  Patient to be admitted for further non-ST elevation MI.  This chart was dictated using voice recognition software.  Despite best efforts to proofread,  errors can occur which can change the documentation meaning.   Final Clinical Impressions(s) / ED Diagnoses   Final diagnoses:  NSTEMI (non-ST elevated myocardial infarction) [facility])    ED Discharge Orders    None       Lennice Sites, DO 01/21/18 2310

## 2018-01-22 ENCOUNTER — Inpatient Hospital Stay (HOSPITAL_COMMUNITY): Payer: Medicare Other

## 2018-01-22 ENCOUNTER — Other Ambulatory Visit: Payer: Self-pay

## 2018-01-22 DIAGNOSIS — I214 Non-ST elevation (NSTEMI) myocardial infarction: Principal | ICD-10-CM

## 2018-01-22 DIAGNOSIS — I10 Essential (primary) hypertension: Secondary | ICD-10-CM

## 2018-01-22 DIAGNOSIS — I482 Chronic atrial fibrillation, unspecified: Secondary | ICD-10-CM

## 2018-01-22 DIAGNOSIS — E782 Mixed hyperlipidemia: Secondary | ICD-10-CM

## 2018-01-22 DIAGNOSIS — R0789 Other chest pain: Secondary | ICD-10-CM

## 2018-01-22 LAB — BASIC METABOLIC PANEL
Anion gap: 7 (ref 5–15)
BUN: 16 mg/dL (ref 8–23)
CHLORIDE: 106 mmol/L (ref 98–111)
CO2: 26 mmol/L (ref 22–32)
CREATININE: 0.78 mg/dL (ref 0.61–1.24)
Calcium: 8.9 mg/dL (ref 8.9–10.3)
GFR calc Af Amer: >60 mL/min (ref >60)
GFR calc non Af Amer: >60 mL/min (ref >60)
GLUCOSE: 91 mg/dL (ref 70–99)
Potassium: 3.8 mmol/L (ref 3.5–5.1)
SODIUM: 139 mmol/L (ref 135–145)

## 2018-01-22 LAB — ECHOCARDIOGRAM COMPLETE
Height: 75 in
WEIGHTICAEL: 3245.17 [oz_av]

## 2018-01-22 LAB — LIPID PANEL
CHOL/HDL RATIO: 2.7 ratio
Cholesterol: 135 mg/dL (ref 0–200)
HDL: 50 mg/dL (ref >40)
LDL Cholesterol: 71 mg/dL (ref 0–99)
TRIGLYCERIDES: 71 mg/dL (ref <150)
VLDL: 14 mg/dL (ref 0–40)

## 2018-01-22 LAB — APTT
APTT: 60 s — AB (ref 24–36)
aPTT: 41 seconds — ABNORMAL HIGH (ref 24–36)

## 2018-01-22 LAB — TROPONIN I
TROPONIN I: 0.57 ng/mL — AB (ref <0.03)
Troponin I: 0.56 ng/mL (ref <0.03)
Troponin I: 0.46 ng/mL (ref <0.03)

## 2018-01-22 LAB — HEPARIN LEVEL (UNFRACTIONATED): Heparin Unfractionated: 0.93 IU/mL — ABNORMAL HIGH (ref 0.30–0.70)

## 2018-01-22 LAB — I-STAT TROPONIN, ED: Troponin i, poc: 0.43 ng/mL (ref 0.00–0.08)

## 2018-01-22 LAB — HEMOGLOBIN A1C
Hgb A1c MFr Bld: 5.3 % (ref 4.8–5.6)
MEAN PLASMA GLUCOSE: 105.41 mg/dL

## 2018-01-22 LAB — MAGNESIUM: MAGNESIUM: 2 mg/dL (ref 1.7–2.4)

## 2018-01-22 LAB — MRSA PCR SCREENING: MRSA by PCR: NEGATIVE

## 2018-01-22 MED ORDER — ATORVASTATIN CALCIUM 40 MG PO TABS
40.0000 mg | ORAL_TABLET | Freq: Every day | ORAL | Status: DC
  Administered 2018-01-22 – 2018-01-23 (×2): 40 mg via ORAL
  Filled 2018-01-22 (×2): qty 1

## 2018-01-22 MED ORDER — ALPHA LIPOIC ACID 200 MG PO CAPS: 600.0000 mg | ORAL_CAPSULE | Freq: Two times a day (BID) | ORAL | Status: DC

## 2018-01-22 MED ORDER — SODIUM CHLORIDE 0.9 % IV SOLN
INTRAVENOUS | Status: DC
  Administered 2018-01-22 – 2018-01-23 (×2): via INTRAVENOUS

## 2018-01-22 MED ORDER — MELATONIN 3 MG PO TABS
3.0000 mg | ORAL_TABLET | Freq: Once | ORAL | Status: AC
  Administered 2018-01-23: 3 mg via ORAL
  Filled 2018-01-22: qty 1

## 2018-01-22 MED ORDER — MELATONIN 3 MG PO TABS
3.0000 mg | ORAL_TABLET | Freq: Once | ORAL | Status: DC
  Filled 2018-01-22: qty 1

## 2018-01-22 MED ORDER — ASPIRIN EC 81 MG PO TBEC
81.0000 mg | DELAYED_RELEASE_TABLET | Freq: Every day | ORAL | Status: DC
  Administered 2018-01-22 – 2018-01-27 (×6): 81 mg via ORAL
  Filled 2018-01-22 (×6): qty 1

## 2018-01-22 MED ORDER — SODIUM CHLORIDE 0.9 % IV SOLN: 250.0000 mL | INTRAVENOUS | Status: DC | PRN

## 2018-01-22 MED ORDER — VITAMIN D 25 MCG (1000 UNIT) PO TABS
1000.0000 [IU] | ORAL_TABLET | Freq: Every day | ORAL | Status: DC
  Administered 2018-01-23 – 2018-01-27 (×5): 1000 [IU] via ORAL

## 2018-01-22 MED ORDER — OMEGA-3-ACID ETHYL ESTERS 1 G PO CAPS
1.0000 g | ORAL_CAPSULE | Freq: Every day | ORAL | Status: DC
  Administered 2018-01-22 – 2018-01-27 (×6): 1 g via ORAL
  Filled 2018-01-22 (×6): qty 1

## 2018-01-22 MED ORDER — ALPRAZOLAM 0.25 MG PO TABS
0.2500 mg | ORAL_TABLET | Freq: Two times a day (BID) | ORAL | Status: DC | PRN
  Administered 2018-01-22 – 2018-01-23 (×2): 0.25 mg via ORAL
  Filled 2018-01-22 (×2): qty 1

## 2018-01-22 MED ORDER — COENZYME Q10 200 MG PO CAPS: 100.0000 mg | ORAL_CAPSULE | Freq: Two times a day (BID) | ORAL | Status: DC

## 2018-01-22 MED ORDER — VITAMIN B-12 100 MCG PO TABS
100.0000 ug | ORAL_TABLET | Freq: Every day | ORAL | Status: DC
  Administered 2018-01-23 – 2018-01-27 (×5): 100 ug via ORAL
  Filled 2018-01-22 (×5): qty 1

## 2018-01-22 MED ORDER — FLAX SEED OIL 1300 MG PO CAPS: 1400.0000 mg | ORAL_CAPSULE | Freq: Every day | ORAL | Status: DC

## 2018-01-22 MED ORDER — NON FORMULARY: 1.0000 | Freq: Once | Status: DC

## 2018-01-22 MED ORDER — NON FORMULARY: 3.0000 mg | Freq: Once | Status: DC

## 2018-01-22 MED ORDER — SODIUM CHLORIDE 0.9% FLUSH: 3.0000 mL | INTRAVENOUS | Status: DC | PRN

## 2018-01-22 MED ORDER — SODIUM CHLORIDE 0.9% FLUSH: 3.0000 mL | Freq: Two times a day (BID) | INTRAVENOUS | Status: DC

## 2018-01-22 NOTE — Progress Notes (Signed)
Progress Note  Patient Name: JAYMARI CROMIE Date of Encounter: 01/22/2018  Primary Cardiologist: [name], MD    Subjective   No angina this am Very anxious Has not slept in 48 hours. Indigestion/Angina like Previous symptoms in 20' before RCA intervention  Inpatient Medications    Scheduled Meds: . aspirin EC  81 mg Oral Daily  . atorvastatin  40 mg Oral q1800  . cholecalciferol  1,000 Units Oral Daily  . Influenza vac split quadrivalent PF  0.5 mL Intramuscular Tomorrow-1000  . Melatonin  3 mg Oral Once  . omega-3 acid ethyl esters  1 g Oral Daily  . pneumococcal 23 valent vaccine  0.5 mL Intramuscular Tomorrow-1000  . vitamin B-12  100 mcg Oral Daily   Continuous Infusions: . heparin 1,100 Units/hr (01/21/18 2346)   PRN Meds:    Vital Signs    Vitals:   01/22/18 0000 01/22/18 0030 01/22/18 0154 01/22/18 0728  BP: (!) 150/62 (!) 145/65 (!) 172/87 (!) 147/58  Pulse: (!) 58 60 (!) 56 (!) 46  Resp: 17 18 17 18   Temp:    98 F (36.7 C)  TempSrc:    Oral  SpO2: 97% 98% 97% 98%  Weight:   92 kg   Height:   6\' 3"  (1.905 m)     Intake/Output Summary (Last 24 hours) at 01/22/2018 0749 Last data filed at 01/22/2018 0500 Gross per 24 hour  Intake 57.39 ml  Output -  Net 57.39 ml   Filed Weights   01/21/18 1807 01/22/18 0154  Weight: 91.2 kg 92 kg    Telemetry    NSR 01/22/2018  - Personally Reviewed  ECG    NSR new anterolateral T wave changes no ST elevation  - Personally Reviewed  Physical Exam   GEN: No acute distress.   Neck: No JVD Cardiac: RRR, no murmurs, rubs, or gallops.  Respiratory: Clear to auscultation bilaterally. GI: Soft, nontender, non-distended  MS: No edema; No deformity. Neuro:  Nonfocal  Psych: Normal affect   Labs    Chemistry Recent Labs  Lab 01/21/18 1920 01/22/18 0347  NA 140 139  K 4.0 3.8  CL 105 106  CO2 24 26  GLUCOSE 104* 91  BUN 21 16  CREATININE 0.85 0.78  CALCIUM 9.2 8.9  PROT 6.6  --   ALBUMIN  4.2  --   AST 43*  --   ALT 46*  --   ALKPHOS 60  --   BILITOT 1.1  --   GFRNONAA >60 >60  GFRAA >60 >60  ANIONGAP 11 7     Hematology Recent Labs  Lab 01/21/18 1844  WBC 7.5  RBC 4.82  HGB 15.5  HCT 47.5  MCV 98.5  MCH 32.2  MCHC 32.6  RDW 12.4  PLT 183    Cardiac Enzymes Recent Labs  Lab 01/22/18 0357  TROPONINI 0.56*    Recent Labs  Lab 01/21/18 1851  TROPIPOC 0.06     BNPNo results for input(s): BNP, PROBNP in the last 168 hours.   DDimer  Recent Labs  Lab 01/21/18 2231  DDIMER 0.28     Radiology    Dg Chest 2 View  Result Date: 01/21/2018 CLINICAL DATA:  Shortness of breath EXAM: CHEST - 2 VIEW COMPARISON:  12/03/2016 FINDINGS: No focal opacity or pleural effusion. Cardiomediastinal silhouette within normal limits. No pneumothorax. Aortic atherosclerosis. IMPRESSION: No active cardiopulmonary disease. Electronically Signed   By: Donavan Foil M.D.   On: 01/21/2018 18:52  Cardiac Studies   None   Patient Profile     HAMZA EMPSON is a 77 y.o. male with CAD s/p PCI to the RCA 1999, HTN, permanent AF with slow VR on apixiban (last dose 11/5 PM), colon cancer s/p R colectomy and chemotherapy now in remission who presents with indigestion and elevated biomarkers concerning for NSTEMI.   Assessment & Plan    SEMI:  Troponin .56 this am no pain. ECG with anterolateral T wave changes. Last dose eliquis yesterday. Risks Of cath discussed including bleeding, contrast reaction , stroke and need for emergency CABG. Willing to proceed Good right radial pulse.   HTN:  Well controlled.  Continue current medications and low sodium Dash type diet.    Afib:  Eliquis held Not on AV nodal blocking drug at home monitor telemetry add beta blocker if needed  HLD:  Change to lipitor 80 mg   For questions or updates, please contact [place] Please consult [email or web address] for contact info under        Signed, [name], MD  01/22/2018, 7:49 AM

## 2018-01-22 NOTE — Progress Notes (Signed)
  Echocardiogram 2D Echocardiogram has been performed.  [name] 01/22/2018, 3:02 PM

## 2018-01-22 NOTE — Progress Notes (Signed)
CRITICAL VALUE ALERT  Critical Value:  Troponin=0.56  Date & Time Notied:  01/22/18 0607  Provider Notified: Barrett, PA  Orders Received/Actions taken: Notified via text page and no new orders given at this time.

## 2018-01-22 NOTE — Progress Notes (Signed)
ANTICOAGULATION CONSULT NOTE - Follow Up Consult  Pharmacy Consult for heparin Indication: chest pain/ACS  No Known Allergies  Patient Measurements: Height: 6\' 3"  (190.5 cm) Weight: 202 lb 13.2 oz (92 kg) IBW/kg (Calculated) : 84.5 Heparin Dosing Weight:   Vital Signs: Temp: 98 F (36.7 C) (11/06 0728) Temp Source: Oral (11/06 0728) BP: 147/58 (11/06 0728) Pulse Rate: 46 (11/06 0728)  Labs: Recent Labs    01/21/18 1844 01/21/18 1920 01/22/18 0347 01/22/18 0357 01/22/18 1141  HGB 15.5  --   --   --   --   HCT 47.5  --   --   --   --   PLT 183  --   --   --   --   APTT  --   --   --   --  41*  HEPARINUNFRC  --   --   --   --  0.93*  CREATININE  --  0.85 0.78  --   --   TROPONINI  --   --   --  0.56*  --     Estimated Creatinine Clearance: 96.8 mL/min (by C-G formula based on SCr of 0.78 mg/dL).   Medical History: Past Medical History:  Diagnosis Date  . A-fib ([place])   . Allergy    Seasonal--pollen  . Anticoagulation goal of INR 2 to 3    on coumadin  . CAD (coronary artery disease) 1999   RCA stent  . Carotid stenosis, asymptomatic    moderate RT and mild LT with carotid dopplers 10/26/11  . Colon cancer ([place]) 04/2006   Stage III--T3 N1  . COPD (chronic obstructive pulmonary disease) ([place])   . H/O cardiovascular stress test 01/13/2010   low risk scan, similar to 2008 study  . H/O echocardiogram 05/23/2009   EF >55%, mild MR, Mild TR,   . Hand foot syndrome   . Hemorrhoids   . Hyperlipidemia   . Hypertension   . Neuropathy   . Permanent atrial fibrillation   . Psoriasis   . Tubular adenoma of colon     Medications:  Medications Prior to Admission  Medication Sig Dispense Refill Last Dose  . Alpha Lipoic Acid 200 MG CAPS Take 600 mg by mouth 2 (two) times daily.    01/21/2018 at Unknown time  . aspirin 81 MG tablet Take 81 mg by mouth daily.   01/20/2018 at Unknown time  . cholecalciferol (VITAMIN D) 1000 UNITS tablet Take 1,000 Units by mouth daily.    01/21/2018 at Unknown time  . Coenzyme Q10 200 MG capsule Take 100 mg by mouth 2 (two) times daily.    01/21/2018 at Unknown time  . Cyanocobalamin (VITAMIN B 12 PO) Take 100 mg by mouth daily.    01/21/2018 at Unknown time  . ELIQUIS 5 MG TABS tablet TAKE 1 TABLET BY MOUTH TWICE DAILY. 180 tablet 0 01/21/2018 at 500 pm  . Flaxseed, Linseed, (FLAX SEED OIL PO) Take 1,400 mg by mouth daily.   01/21/2018 at Unknown time  . Omega-3 Fatty Acids (FISH OIL) 1200 MG CAPS Take 1,000 mg by mouth daily.    01/21/2018 at Unknown time  . simvastatin (ZOCOR) 80 MG tablet Take 1 tablet (80 mg total) by mouth at bedtime. KEEP OV. 90 tablet 0 01/20/2018 at Unknown time  . vitamin C (ASCORBIC ACID) 500 MG tablet Take 1,000 mg by mouth 2 (two) times daily.   01/21/2018 at Unknown time   Infusions:  . heparin 1,100 Units/hr (  01/21/18 2346)    Assessment: Patient with Hx CAD/PCI 1999 admitted with NSTEMI.  Patient on apixaban prior to admission for Hx Afib with last dose noted per med rec 11/5 at 1700. Heparin drip started 1100 uts/hr HL falsely elevated by apixaban, aptt 41sec < goal.  No bleeding noted.  Will use aptt to dose heparin until apixaban cleared and aptt and HL correltate  Goal of Therapy:  Heparin level 0.3-0.7 units/ml aPTT 66-102 seconds Monitor platelets by anticoagulation protocol: Yes   Plan:   Increase Heparin drip at 1400 units/hr aptt 6hr after rate increase  Daily CBC, HL, aptt     Bonnita Nasuti Pharm.D. CPP, BCPS Clinical Pharmacist [phone] 01/22/2018 1:45 PM

## 2018-01-22 NOTE — ED Notes (Signed)
[facility] called at this time.

## 2018-01-22 NOTE — ED Notes (Signed)
[facility] at bedside at this time.  

## 2018-01-22 NOTE — Progress Notes (Signed)
ANTICOAGULATION CONSULT NOTE - Follow Up Consult  Pharmacy Consult for heparin Indication: chest pain/ACS  No Known Allergies  Patient Measurements: Height: 6\' 3"  (190.5 cm) Weight: 202 lb 13.2 oz (92 kg) IBW/kg (Calculated) : 84.5 Heparin Dosing Weight:   Vital Signs: Temp: 97.5 F (36.4 C) (11/06 1950) Temp Source: Oral (11/06 1950) BP: 134/68 (11/06 1950) Pulse Rate: 57 (11/06 1950)  Labs: Recent Labs    01/21/18 1844 01/21/18 1920 01/22/18 0347 01/22/18 0357 01/22/18 1141 01/22/18 1236 01/22/18 1854  HGB 15.5  --   --   --   --   --   --   HCT 47.5  --   --   --   --   --   --   PLT 183  --   --   --   --   --   --   APTT  --   --   --   --  41*  --  60*  HEPARINUNFRC  --   --   --   --  0.93*  --   --   CREATININE  --  0.85 0.78  --   --   --   --   TROPONINI  --   --   --  0.56*  --  0.57*  --     Estimated Creatinine Clearance: 96.8 mL/min (by C-G formula based on SCr of 0.78 mg/dL).   Medical History: Past Medical History:  Diagnosis Date  . A-fib ([place])   . Allergy    Seasonal--pollen  . Anticoagulation goal of INR 2 to 3    on coumadin  . CAD (coronary artery disease) 1999   RCA stent  . Carotid stenosis, asymptomatic    moderate RT and mild LT with carotid dopplers 10/26/11  . Colon cancer ([place]) 04/2006   Stage III--T3 N1  . COPD (chronic obstructive pulmonary disease) ([place])   . H/O cardiovascular stress test 01/13/2010   low risk scan, similar to 2008 study  . H/O echocardiogram 05/23/2009   EF >55%, mild MR, Mild TR,   . Hand foot syndrome   . Hemorrhoids   . Hyperlipidemia   . Hypertension   . Neuropathy   . Permanent atrial fibrillation   . Psoriasis   . Tubular adenoma of colon     Medications:  Medications Prior to Admission  Medication Sig Dispense Refill Last Dose  . Alpha Lipoic Acid 200 MG CAPS Take 600 mg by mouth 2 (two) times daily.    01/21/2018 at Unknown time  . aspirin 81 MG tablet Take 81 mg by mouth daily.   01/20/2018  at Unknown time  . cholecalciferol (VITAMIN D) 1000 UNITS tablet Take 1,000 Units by mouth daily.   01/21/2018 at Unknown time  . Coenzyme Q10 200 MG capsule Take 100 mg by mouth 2 (two) times daily.    01/21/2018 at Unknown time  . Cyanocobalamin (VITAMIN B 12 PO) Take 100 mg by mouth daily.    01/21/2018 at Unknown time  . ELIQUIS 5 MG TABS tablet TAKE 1 TABLET BY MOUTH TWICE DAILY. 180 tablet 0 01/21/2018 at 500 pm  . Flaxseed, Linseed, (FLAX SEED OIL PO) Take 1,400 mg by mouth daily.   01/21/2018 at Unknown time  . Omega-3 Fatty Acids (FISH OIL) 1200 MG CAPS Take 1,000 mg by mouth daily.    01/21/2018 at Unknown time  . simvastatin (ZOCOR) 80 MG tablet Take 1 tablet (80 mg total) by mouth at  bedtime. KEEP OV. 90 tablet 0 01/20/2018 at Unknown time  . vitamin C (ASCORBIC ACID) 500 MG tablet Take 1,000 mg by mouth 2 (two) times daily.   01/21/2018 at Unknown time   Infusions:  . sodium chloride    . sodium chloride    . heparin 1,400 Units/hr (01/22/18 1436)    Assessment: Patient with Hx CAD/PCI 1999 admitted with NSTEMI.  Patient on apixaban prior to admission for Hx Afib with last dose noted per med rec 11/5 at 1700. Heparin drip started 1100 uts/hr HL falsely elevated by apixaban, aptt 41sec < goal.  No bleeding noted.  Will use aptt to dose heparin until apixaban cleared and aptt and HL correltate  PM f/u > PTT remains below goal despite rate increase earlier today.  No overt bleeding or complications noted.  Per RN, no known issues with IV infusion.  Goal of Therapy:  Heparin level 0.3-0.7 units/ml aPTT 66-102 seconds Monitor platelets by anticoagulation protocol: Yes   Plan:  Increase Heparin drip to 1600 units/hr aptt 6hr after rate increase  Daily CBC, HL, aptt    [place] Crane, Roylene Reason, [facility] Clinical Pharmacist Phone [phone]  01/22/2018 8:45 PM

## 2018-01-22 NOTE — H&P (Signed)
Cardiology Admission History and Physical:   Patient ID: [name] MRN: [id number]; DOB: 12-Nov-1940   Admission date: 01/21/2018  Primary Care Provider: Lavone Orn, MD Primary Cardiologist: Dr. Gwenlyn Found Primary Electrophysiologist:  None   Chief Complaint:  indigestion  Patient Profile:   [name] is a 77 y.o. male with CAD s/p PCI to the RCA 1999, HTN, permanent AF with slow VR on apixiban (last dose 11/5 PM), colon cancer s/p R colectomy and chemotherapy now in remission who presents with indigestion and elevated biomarkers concerning for NSTEMI.   History of Present Illness:   Briefly, [name] is an active gentleman - going to the gym 5-6 days a week doing cardio and weight training, an avid gardener, and chef. He felt at this baseline until this evening when - while standing at his kitchen sink - he developed subtle stomach pain and indigestion. While not as severe as his prior episode, it was concerning enough that he asked his wife to drive him to the hospital. He denies associated diaphoresis, SOB, dizziness or lightheadedness. He has not had similar discomfort in the preceding days. No change in his exercise tolerance and no LE edema, orthopnea, or PND.   Upon arrival to the ED, patient was hypertensive to 173/94 with a HR of 96. According to documentation from [facility] ED, patients initial troponin was 0.06 and had risen on repeat to 0.43. EKG notable for TWIs in the anterior leads. At this point he was started on a heparin gtt and cardiology was consulted for admission out of concern for NSTEMI.   Patient reports being pain free since his admission to the ER.   Of note, despite the troponin elevation to 0.43 that is documented, it is possible that this result wasn't pushed into the system from a POC machine as It doesn;t appear in results.    Past Medical History:  Diagnosis Date  . A-fib ([place])   . Allergy    Seasonal--pollen  . Anticoagulation goal of INR 2 to  3    on coumadin  . CAD (coronary artery disease) 1999   RCA stent  . Carotid stenosis, asymptomatic    moderate RT and mild LT with carotid dopplers 10/26/11  . Colon cancer ([place]) 04/2006   Stage III--T3 N1  . COPD (chronic obstructive pulmonary disease) ([place])   . H/O cardiovascular stress test 01/13/2010   low risk scan, similar to 2008 study  . H/O echocardiogram 05/23/2009   EF >55%, mild MR, Mild TR,   . Hand foot syndrome   . Hemorrhoids   . Hyperlipidemia   . Hypertension   . Neuropathy   . Permanent atrial fibrillation   . Psoriasis   . Tubular adenoma of colon     Past Surgical History:  Procedure Laterality Date  . COLONOSCOPY    . CORONARY STENT PLACEMENT  1999   RCA tandem NIR stents  . RIGHT COLECTOMY  05/29/2006   Laparoscopic assisted  . TONSILLECTOMY       Medications Prior to Admission: Prior to Admission medications   Medication Sig Start Date End Date Taking? Authorizing Provider  Alpha Lipoic Acid 200 MG CAPS Take 600 mg by mouth 2 (two) times daily.    Yes [provider]  aspirin 81 MG tablet Take 81 mg by mouth daily.   Yes [provider]  cholecalciferol (VITAMIN D) 1000 UNITS tablet Take 1,000 Units by mouth daily.   Yes [provider]  Coenzyme Q10 200  MG capsule Take 100 mg by mouth 2 (two) times daily.    Yes [provider]  Cyanocobalamin (VITAMIN B 12 PO) Take 100 mg by mouth daily.    Yes [provider]  ELIQUIS 5 MG TABS tablet TAKE 1 TABLET BY MOUTH TWICE DAILY. 01/01/18  Yes Lorretta Harp, MD  Flaxseed, Linseed, (FLAX SEED OIL PO) Take 1,400 mg by mouth daily.   Yes [provider]  Omega-3 Fatty Acids (FISH OIL) 1200 MG CAPS Take 1,000 mg by mouth daily.    Yes [provider]  simvastatin (ZOCOR) 80 MG tablet Take 1 tablet (80 mg total) by mouth at bedtime. KEEP OV. 01/15/18  Yes Lorretta Harp, MD  vitamin C (ASCORBIC ACID) 500 MG tablet Take 1,000 mg by mouth 2 (two)  times daily.   Yes [provider]    Allergies:   No Known Allergies  Social History:   Social History   Socioeconomic History  . Marital status: Married    Spouse name: [name]  . Number of children: 2  . Years of education: Not on file  . Highest education level: Not on file  Occupational History  . Occupation: retired    Fish farm manager: RETIRED  Social Needs  . Financial resource strain: Not on file  . Food insecurity:    Worry: Not on file    Inability: Not on file  . Transportation needs:    Medical: Not on file    Non-medical: Not on file  Tobacco Use  . Smoking status: Former Smoker    Years: 30.00    Last attempt to quit: 11/25/1993    Years since quitting: 24.1  . Smokeless tobacco: Never Used  Substance and Sexual Activity  . Alcohol use: Yes    Alcohol/week: 1.0 standard drinks    Types: 1 Cans of beer per week  . Drug use: No  . Sexual activity: Not on file  Lifestyle  . Physical activity:    Days per week: Not on file    Minutes per session: Not on file  . Stress: Not on file  Relationships  . Social connections:    Talks on phone: Not on file    Gets together: Not on file    Attends religious service: Not on file    Active member of club or organization: Not on file    Attends meetings of clubs or organizations: Not on file    Relationship status: Not on file  . Intimate partner violence:    Fear of current or ex partner: Not on file    Emotionally abused: Not on file    Physically abused: Not on file    Forced sexual activity: Not on file  Other Topics Concern  . Not on file  Social History Narrative  . Not on file    Family History:   The patient's family history includes Healthy in his sister; Heart disease in his mother, paternal grandfather, and paternal grandmother; Heart disease (age of onset: 40) in his father; Hypertension in his mother and paternal grandfather. There is no history of Colon cancer.    Review of Systems: [y] =  yes, [ ]  = no     General: Weight gain [ ] ; Weight loss [ ] ; Anorexia [ ] ; Fatigue [ ] ; Fever [ ] ; Chills [ ] ; Weakness [ ]    Cardiac: Chest pain/pressure Blue.Reese ]; Resting SOB [ ] ; Exertional SOB [ ] ; Orthopnea [ ] ; Pedal Edema [ ] ;  Palpitations [ ] ; Syncope [ ] ; Presyncope [ ] ; Paroxysmal nocturnal dyspnea[ ]    Pulmonary: Cough [ ] ; Wheezing[ ] ; Hemoptysis[ ] ; Sputum [ ] ; Snoring [ ]    GI: Vomiting[ ] ; Dysphagia[ ] ; Melena[ ] ; Hematochezia [ ] ; Heartburn[ ] ; Abdominal pain [ ] ; Constipation [ ] ; Diarrhea [ ] ; BRBPR [ ]    GU: Hematuria[ ] ; Dysuria [ ] ; Nocturia[ ]    Vascular: Pain in legs with walking [ ] ; Pain in feet with lying flat [ ] ; Non-healing sores [ ] ; Stroke [ ] ; TIA [ ] ; Slurred speech [ ] ;   Neuro: Headaches[ ] ; Vertigo[ ] ; Seizures[ ] ; Paresthesias[ ] ;Blurred vision [ ] ; Diplopia [ ] ; Vision changes [ ]    Ortho/Skin: Arthritis [ ] ; Joint pain [ ] ; Muscle pain [ ] ; Joint swelling [ ] ; Back Pain [ ] ; Rash [ ]    Psych: Depression[ ] ; Anxiety[ ]    Heme: Bleeding problems [ ] ; Clotting disorders [ ] ; Anemia [ ]    Endocrine: Diabetes [ ] ; Thyroid dysfunction[ ]   Physical Exam/Data:   Vitals:   01/21/18 2330 01/22/18 0000 01/22/18 0030 01/22/18 0154  BP: (!) 163/68 (!) 150/62 (!) 145/65 (!) 172/87  Pulse: (!) 56 (!) 58 60 (!) 56  Resp: 16 17 18 17   Temp:      TempSrc:      SpO2: 96% 97% 98% 97%  Weight:    92 kg  Height:    6\' 3"  (1.905 m)   No intake or output data in the 24 hours ending 01/22/18 0338 Filed Weights   01/21/18 1807 01/22/18 0154  Weight: 91.2 kg 92 kg   Body mass index is 25.35 kg/m.  General:  Well nourished, well developed, in no acute distress HEENT: normal Lymph: no adenopathy Neck: no JVD Endocrine:  No thryomegaly Vascular: No carotid bruits; FA pulses 2+ bilaterally without bruits  Cardiac:  Irregularly, irregular. Bradycardic. Normal s1, s2.  Lungs:  clear to auscultation bilaterally, no wheezing, rhonchi or rales  Abd: soft,  nontender, no hepatomegaly  Ext: no edema Musculoskeletal:  No deformities, BUE and BLE strength normal and equal Skin: warm and dry  Neuro:  CNs 2-12 intact, no focal abnormalities noted Psych:  Normal affect   EKG:  The ECG that was done was personally reviewed and demonstrates atrial fibrillation with slow ventricular response. There are TWI in V2-V5.   Relevant CV Studies: Carotid Ultrasound 12/2017: Summary: Right Carotid: Velocities in the right ICA are consistent with a 40-59%        stenosis.        Non-hemodynamically significant plaque <50% noted in the CCA.  Left Carotid: Velocities in the left ICA are consistent with a 40-59% stenosis.       Non-hemodynamically significant plaque noted in the CCA.       The ECA appears >50% stenosed.  Vertebrals: Bilateral vertebral arteries demonstrate antegrade flow. Subclavians: Right subclavian artery flow was disturbed.       Normal flow hemodynamics were seen in the left subclavian artery.  Nuc SPECT 2011: Normal LVEF.  No suggestion of infarct or ishcemia.   Cath 1999: Diffuse 20-30% LAD 95% mRCA s/p PCI  Laboratory Data:  Chemistry Recent Labs  Lab 01/21/18 1920  NA 140  K 4.0  CL 105  CO2 24  GLUCOSE 104*  BUN 21  CREATININE 0.85  CALCIUM 9.2  GFRNONAA >60  GFRAA >60  ANIONGAP 11    Recent Labs  Lab 01/21/18 1920  PROT 6.6  ALBUMIN 4.2  AST 43*  ALT 46*  ALKPHOS 60  BILITOT 1.1   Hematology Recent Labs  Lab 01/21/18 1844  WBC 7.5  RBC 4.82  HGB 15.5  HCT 47.5  MCV 98.5  MCH 32.2  MCHC 32.6  RDW 12.4  PLT 183   Cardiac EnzymesNo results for input(s): TROPONINI in the last 168 hours.  Recent Labs  Lab 01/21/18 1851  TROPIPOC 0.06    BNPNo results for input(s): BNP, PROBNP in the last 168 hours.  DDimer  Recent Labs  Lab 01/21/18 2231  DDIMER 0.28    Radiology/Studies:  Dg Chest 2 View  Result Date: 01/21/2018 CLINICAL DATA:  Shortness  of breath EXAM: CHEST - 2 VIEW COMPARISON:  12/03/2016 FINDINGS: No focal opacity or pleural effusion. Cardiomediastinal silhouette within normal limits. No pneumothorax. Aortic atherosclerosis. IMPRESSION: No active cardiopulmonary disease. Electronically Signed   By: Donavan Foil M.D.   On: 01/21/2018 18:52    Assessment and Plan:   1. Aytpical Chest Pain, Elevated Cardiac Enzymes, NSTEMI -- Would continue heparin gtt, ASA 81 as rx for ACS -- No BB given significant bradycardia -- Can use nitrates for BP control (not on antihypertensives at home) for now but ultimately should be transitioned to ACEi/ARB as GDMT. Watch BP cautiously with nitro suspicion for RCA disease.  -- NPO except for meds at [facility] for possible cath -- Complete TTE  2. HTN -- Will start captopril 6.25 q8 for HTN (want to avoid nitrates as above).  -- Can transition to long-acting ACEi or ARB upon discharge.   Severity of Illness: The appropriate patient status for this patient is OBSERVATION. Observation status is judged to be reasonable and necessary in order to provide the required intensity of service to ensure the patient's safety. The patient's presenting symptoms, physical exam findings, and initial radiographic and laboratory data in the context of their medical condition is felt to place them at decreased risk for further clinical deterioration. Furthermore, it is anticipated that the patient will be medically stable for discharge from the hospital within 2 midnights of admission. The following factors support the patient status of observation.   " The patient's presenting symptoms include chest pain.  " The physical exam findings include abnormal EKG " The initial radiographic and laboratory data are elevated troponins.     For questions or updates, please contact [place] Please consult [email or web address] for contact info under      Signed, Milus Banister, MD  01/22/2018 3:38 AM

## 2018-01-23 ENCOUNTER — Other Ambulatory Visit: Payer: Self-pay | Admitting: *Deleted

## 2018-01-23 ENCOUNTER — Inpatient Hospital Stay (HOSPITAL_COMMUNITY): Payer: Medicare Other

## 2018-01-23 ENCOUNTER — Encounter (HOSPITAL_COMMUNITY)
Admission: EM | Disposition: A | Discharge status: Home / Self Care | Payer: Self-pay | Source: Home / Self Care | Attending: Cardiology

## 2018-01-23 ENCOUNTER — Encounter (HOSPITAL_COMMUNITY): Payer: Self-pay | Admitting: Internal Medicine

## 2018-01-23 DIAGNOSIS — I2510 Atherosclerotic heart disease of native coronary artery without angina pectoris: Secondary | ICD-10-CM

## 2018-01-23 DIAGNOSIS — I25110 Atherosclerotic heart disease of native coronary artery with unstable angina pectoris: Secondary | ICD-10-CM

## 2018-01-23 DIAGNOSIS — Z7901 Long term (current) use of anticoagulants: Secondary | ICD-10-CM

## 2018-01-23 HISTORY — PX: LEFT HEART CATH AND CORONARY ANGIOGRAPHY: CATH118249

## 2018-01-23 LAB — CBC
HEMATOCRIT: 45.2 % (ref 39.0–52.0)
Hemoglobin: 14.9 g/dL (ref 13.0–17.0)
MCH: 32.3 pg (ref 26.0–34.0)
MCHC: 33 g/dL (ref 30.0–36.0)
MCV: 97.8 fL (ref 80.0–100.0)
NRBC: 0 % (ref 0.0–0.2)
PLATELETS: 154 10*3/uL (ref 150–400)
RBC: 4.62 MIL/uL (ref 4.22–5.81)
RDW: 12.5 % (ref 11.5–15.5)
WBC: 7.7 10*3/uL (ref 4.0–10.5)

## 2018-01-23 LAB — PULMONARY FUNCTION TEST
FEF 25-75 Pre: 0.7 L/sec
FEF2575-%PRED-PRE: 25 %
FEV1-%PRED-PRE: 43 %
FEV1-Pre: 1.64 L
FEV1FVC-%Pred-Pre: 73 %
FEV6-%PRED-PRE: 58 %
FEV6-PRE: 2.85 L
FEV6FVC-%Pred-Pre: 98 %
FVC-%PRED-PRE: 59 %
FVC-Pre: 3.04 L
Pre FEV1/FVC ratio: 54 %
Pre FEV6/FVC Ratio: 93 %

## 2018-01-23 LAB — APTT
APTT: 108 s — AB (ref 24–36)
APTT: 33 s (ref 24–36)
APTT: 49 s — AB (ref 24–36)

## 2018-01-23 LAB — HEPARIN LEVEL (UNFRACTIONATED)
HEPARIN UNFRACTIONATED: 0.81 [IU]/mL — AB (ref 0.30–0.70)
Heparin Unfractionated: 0.38 IU/mL (ref 0.30–0.70)

## 2018-01-23 SURGERY — LEFT HEART CATH AND CORONARY ANGIOGRAPHY
Anesthesia: LOCAL

## 2018-01-23 MED ORDER — HEPARIN (PORCINE) 25000 UT/250ML-% IV SOLN
1300.0000 [IU]/h | INTRAVENOUS | Status: DC
  Administered 2018-01-23 (×2): 1400 [IU]/h via INTRAVENOUS
  Administered 2018-01-24: 1500 [IU]/h via INTRAVENOUS
  Administered 2018-01-26 – 2018-01-27 (×3): 1300 [IU]/h via INTRAVENOUS
  Filled 2018-01-23 (×6): qty 250

## 2018-01-23 MED ORDER — VERAPAMIL HCL 2.5 MG/ML IV SOLN
INTRAVENOUS | Status: DC | PRN
  Administered 2018-01-23: 10 mL via INTRA_ARTERIAL

## 2018-01-23 MED ORDER — SODIUM CHLORIDE 0.9 % IV SOLN: 250.0000 mL | INTRAVENOUS | Status: DC | PRN

## 2018-01-23 MED ORDER — VERAPAMIL HCL 2.5 MG/ML IV SOLN
INTRAVENOUS | Status: AC
  Filled 2018-01-23: qty 2

## 2018-01-23 MED ORDER — MIDAZOLAM HCL 2 MG/2ML IJ SOLN
INTRAMUSCULAR | Status: AC
  Filled 2018-01-23: qty 2

## 2018-01-23 MED ORDER — PNEUMOCOCCAL VAC POLYVALENT 25 MCG/0.5ML IJ INJ
0.5000 mL | INJECTION | INTRAMUSCULAR | Status: AC
  Administered 2018-01-25: 0.5 mL via INTRAMUSCULAR

## 2018-01-23 MED ORDER — SODIUM CHLORIDE 0.9 % IV SOLN
INTRAVENOUS | Status: AC
  Administered 2018-01-23: 13:00:00 via INTRAVENOUS

## 2018-01-23 MED ORDER — INFLUENZA VAC SPLIT HIGH-DOSE 0.5 ML IM SUSY
0.5000 mL | PREFILLED_SYRINGE | INTRAMUSCULAR | Status: AC
  Administered 2018-01-25: 0.5 mL via INTRAMUSCULAR
  Filled 2018-01-23 (×2): qty 0.5

## 2018-01-23 MED ORDER — HEPARIN SODIUM (PORCINE) 1000 UNIT/ML IJ SOLN
INTRAMUSCULAR | Status: DC | PRN
  Administered 2018-01-23: 4500 [IU] via INTRAVENOUS

## 2018-01-23 MED ORDER — LIDOCAINE HCL (PF) 1 % IJ SOLN
INTRAMUSCULAR | Status: AC
  Filled 2018-01-23: qty 30

## 2018-01-23 MED ORDER — SODIUM CHLORIDE 0.9% FLUSH
3.0000 mL | Freq: Two times a day (BID) | INTRAVENOUS | Status: DC
  Administered 2018-01-25 – 2018-01-27 (×4): 3 mL via INTRAVENOUS

## 2018-01-23 MED ORDER — TEMAZEPAM 15 MG PO CAPS
15.0000 mg | ORAL_CAPSULE | Freq: Every evening | ORAL | Status: DC | PRN
  Administered 2018-01-24 – 2018-01-26 (×3): 15 mg via ORAL
  Filled 2018-01-23 (×3): qty 1

## 2018-01-23 MED ORDER — FENTANYL CITRATE (PF) 100 MCG/2ML IJ SOLN
INTRAMUSCULAR | Status: AC
  Filled 2018-01-23: qty 2

## 2018-01-23 MED ORDER — HEPARIN (PORCINE) IN NACL 1000-0.9 UT/500ML-% IV SOLN
INTRAVENOUS | Status: AC
  Filled 2018-01-23: qty 1000

## 2018-01-23 MED ORDER — HEPARIN (PORCINE) IN NACL 1000-0.9 UT/500ML-% IV SOLN
INTRAVENOUS | Status: DC | PRN
  Administered 2018-01-23: 500 mL

## 2018-01-23 MED ORDER — MIDAZOLAM HCL 2 MG/2ML IJ SOLN
INTRAMUSCULAR | Status: DC | PRN
  Administered 2018-01-23: 1 mg via INTRAVENOUS

## 2018-01-23 MED ORDER — IOHEXOL 350 MG/ML SOLN
INTRAVENOUS | Status: DC | PRN
  Administered 2018-01-23: 40 mL via INTRAVENOUS

## 2018-01-23 MED ORDER — FENTANYL CITRATE (PF) 100 MCG/2ML IJ SOLN
INTRAMUSCULAR | Status: DC | PRN
  Administered 2018-01-23: 50 ug via INTRAVENOUS

## 2018-01-23 MED ORDER — SODIUM CHLORIDE 0.9% FLUSH: 3.0000 mL | INTRAVENOUS | Status: DC | PRN

## 2018-01-23 MED ORDER — HEPARIN (PORCINE) 25000 UT/250ML-% IV SOLN: 1450.0000 [IU]/h | INTRAVENOUS | Status: DC

## 2018-01-23 MED ORDER — LIDOCAINE HCL (PF) 1 % IJ SOLN
INTRAMUSCULAR | Status: DC | PRN
  Administered 2018-01-23: 2 mL

## 2018-01-23 SURGICAL SUPPLY — 10 items
CATH 5FR JL3.5 JR4 ANG PIG MP (CATHETERS) ×2 IMPLANT
CATH INFINITI 5FR JL4 (CATHETERS) ×2 IMPLANT
DEVICE RAD COMP TR BAND LRG (VASCULAR PRODUCTS) ×2 IMPLANT
GLIDESHEATH SLEND SS 6F .021 (SHEATH) ×2 IMPLANT
GUIDEWIRE INQWIRE 1.5J.035X260 (WIRE) ×1 IMPLANT
INQWIRE 1.5J .035X260CM (WIRE) ×2
KIT HEART LEFT (KITS) ×2 IMPLANT
PACK CARDIAC CATHETERIZATION (CUSTOM PROCEDURE TRAY) ×2 IMPLANT
TRANSDUCER W/STOPCOCK (MISCELLANEOUS) ×2 IMPLANT
TUBING CIL FLEX 10 FLL-RA (TUBING) ×2 IMPLANT

## 2018-01-23 NOTE — Progress Notes (Signed)
ANTICOAGULATION CONSULT NOTE - Follow Up Consult  Pharmacy Consult for Heparin Indication: chest pain/ACS  No Known Allergies  Patient Measurements: Height: 6\' 3"  (190.5 cm) Weight: 202 lb 13.2 oz (92 kg) IBW/kg (Calculated) : 84.5 Heparin Dosing Weight:   Vital Signs: Temp: 98.3 F (36.8 C) (11/07 2125) Temp Source: Oral (11/07 2125) BP: 114/57 (11/07 2125) Pulse Rate: 54 (11/07 2125)  Labs: Recent Labs    01/21/18 1844 01/21/18 1920 01/22/18 0347 01/22/18 0357  01/22/18 1141 01/22/18 1236 01/22/18 1854 01/23/18 0403 01/23/18 1438 01/23/18 2220  HGB 15.5  --   --   --   --   --   --   --  14.9  --   --   HCT 47.5  --   --   --   --   --   --   --  45.2  --   --   PLT 183  --   --   --   --   --   --   --  154  --   --   APTT  --   --   --   --    < > 41*  --  60* 108* 33 49*  HEPARINUNFRC  --   --   --   --   --  0.93*  --   --  0.81*  --  0.38  CREATININE  --  0.85 0.78  --   --   --   --   --   --   --   --   TROPONINI  --   --   --  0.56*  --   --  0.57* 0.46*  --   --   --    < > = values in this interval not displayed.    Estimated Creatinine Clearance: 96.8 mL/min (by C-G formula based on SCr of 0.78 mg/dL).   Medical History: Past Medical History:  Diagnosis Date  . A-fib ([place])   . Allergy    Seasonal--pollen  . Anticoagulation goal of INR 2 to 3    on coumadin  . CAD (coronary artery disease) 1999   RCA stent  . Carotid stenosis, asymptomatic    moderate RT and mild LT with carotid dopplers 10/26/11  . Colon cancer ([place]) 04/2006   Stage III--T3 N1  . COPD (chronic obstructive pulmonary disease) ([place])   . H/O cardiovascular stress test 01/13/2010   low risk scan, similar to 2008 study  . H/O echocardiogram 05/23/2009   EF >55%, mild MR, Mild TR,   . Hand foot syndrome   . Hemorrhoids   . Hyperlipidemia   . Hypertension   . Neuropathy   . Permanent atrial fibrillation   . Psoriasis   . Tubular adenoma of colon     Medications:  Medications  Prior to Admission  Medication Sig Dispense Refill Last Dose  . Alpha Lipoic Acid 200 MG CAPS Take 600 mg by mouth 2 (two) times daily.    01/21/2018 at Unknown time  . aspirin 81 MG tablet Take 81 mg by mouth daily.   01/20/2018 at Unknown time  . cholecalciferol (VITAMIN D) 1000 UNITS tablet Take 1,000 Units by mouth daily.   01/21/2018 at Unknown time  . Coenzyme Q10 200 MG capsule Take 100 mg by mouth 2 (two) times daily.    01/21/2018 at Unknown time  . Cyanocobalamin (VITAMIN B 12 PO) Take 100 mg by mouth daily.  01/21/2018 at Unknown time  . ELIQUIS 5 MG TABS tablet TAKE 1 TABLET BY MOUTH TWICE DAILY. 180 tablet 0 01/21/2018 at 500 pm  . Flaxseed, Linseed, (FLAX SEED OIL PO) Take 1,400 mg by mouth daily.   01/21/2018 at Unknown time  . Omega-3 Fatty Acids (FISH OIL) 1200 MG CAPS Take 1,000 mg by mouth daily.    01/21/2018 at Unknown time  . simvastatin (ZOCOR) 80 MG tablet Take 1 tablet (80 mg total) by mouth at bedtime. KEEP OV. 90 tablet 0 01/20/2018 at Unknown time  . vitamin C (ASCORBIC ACID) 500 MG tablet Take 1,000 mg by mouth 2 (two) times daily.   01/21/2018 at Unknown time   Infusions:  . sodium chloride    . heparin 1,400 Units/hr (01/23/18 2059)    Assessment: Patient with Hx CAD/PCI 1999 admitted with NSTEMI.  Patient on apixaban prior to admission for Hx Afib with last dose noted per med rec 11/5 at 1700. Heparin drip started 1100 uts/hr HL falsely elevated by apixaban, aptt 41sec < goal.  No bleeding noted.  Will use aptt to dose heparin until apixaban cleared and aptt and HL correltate  11/7 AM update: aPTT and heparin level are just above goal, they are close to correlating, will check both aPTT and heparin level one more time together later today after rate decrease  11/7 PM update: aPTT sub-therapeutic after re-start s/p cath, still some apixaban influence on the heparin level, pt has multi-vessel CAD, now awaiting CABG this coming Tuesday, no issues per RN.   Goal of  Therapy:  Heparin level 0.3-0.7 units/ml aPTT 66-102 seconds Monitor platelets by anticoagulation protocol: Yes   Plan:  Inc heparin to 1500 unitshr Re-check aPTT/HL in 8 hours  Daily CBC, HL, aptt CABG next week  Narda Bonds, PharmD, [place] Pharmacist Phone: [phone]

## 2018-01-23 NOTE — H&P (View-Only) (Signed)
Progress Note  Patient Name: [name] Date of Encounter: 01/23/2018  Primary Cardiologist: [name], MD   Subjective   Feeling a little groggy and weak this mroning. No complaints of chest pain, SOB, dizziness, or lightheadedness.  Inpatient Medications    Scheduled Meds: . aspirin EC  81 mg Oral Daily  . atorvastatin  40 mg Oral q1800  . cholecalciferol  1,000 Units Oral Daily  . Influenza vac split quadrivalent PF  0.5 mL Intramuscular Tomorrow-1000  . omega-3 acid ethyl esters  1 g Oral Daily  . pneumococcal 23 valent vaccine  0.5 mL Intramuscular Tomorrow-1000  . sodium chloride flush  3 mL Intravenous Q12H  . vitamin B-12  100 mcg Oral Daily   Continuous Infusions: . sodium chloride    . sodium chloride 50 mL/hr at 01/22/18 2042  . heparin 1,450 Units/hr (01/23/18 0647)   PRN Meds: sodium chloride, ALPRAZolam, sodium chloride flush   Vital Signs    Vitals:   01/22/18 0728 01/22/18 1430 01/22/18 1950 01/23/18 0512  BP: (!) 147/58 (!) 137/57 134/68 (!) 141/71  Pulse: (!) 46 (!) 56 (!) 57   Resp: 18 19    Temp: 98 F (36.7 C) 98.2 F (36.8 C) (!) 97.5 F (36.4 C) 97.7 F (36.5 C)  TempSrc: Oral Oral Oral Oral  SpO2: 98% 96% 98% 98%  Weight:    92 kg  Height:        Intake/Output Summary (Last 24 hours) at 01/23/2018 0749 Last data filed at 01/23/2018 0019 Gross per 24 hour  Intake 105.6 ml  Output 1800 ml  Net -1694.4 ml   Filed Weights   01/21/18 1807 01/22/18 0154 01/23/18 0512  Weight: 91.2 kg 92 kg 92 kg    Telemetry    Atrial fibrillation with CVR/SVR - Personally Reviewed  Physical Exam   GEN: Laying in bed in no acute distress.   Neck: No JVD, no carotid bruits Cardiac: IRIR, no murmurs, rubs, or gallops.  Respiratory: Clear to auscultation bilaterally, no wheezes/ rales/ rhonchi GI: NABS, Soft, nontender, non-distended  MS: No edema; No deformity. Neuro:  Nonfocal, moving all extremities spontaneously Psych: Normal  affect   Labs    Chemistry Recent Labs  Lab 01/21/18 1920 01/22/18 0347  NA 140 139  K 4.0 3.8  CL 105 106  CO2 24 26  GLUCOSE 104* 91  BUN 21 16  CREATININE 0.85 0.78  CALCIUM 9.2 8.9  PROT 6.6  --   ALBUMIN 4.2  --   AST 43*  --   ALT 46*  --   ALKPHOS 60  --   BILITOT 1.1  --   GFRNONAA >60 >60  GFRAA >60 >60  ANIONGAP 11 7     Hematology Recent Labs  Lab 01/21/18 1844 01/23/18 0403  WBC 7.5 7.7  RBC 4.82 4.62  HGB 15.5 14.9  HCT 47.5 45.2  MCV 98.5 97.8  MCH 32.2 32.3  MCHC 32.6 33.0  RDW 12.4 12.5  PLT 183 154    Cardiac Enzymes Recent Labs  Lab 01/22/18 0357 01/22/18 1236 01/22/18 1854  TROPONINI 0.56* 0.57* 0.46*    Recent Labs  Lab 01/21/18 1851 01/21/18 2206  TROPIPOC 0.06 0.43*     BNPNo results for input(s): BNP, PROBNP in the last 168 hours.   DDimer  Recent Labs  Lab 01/21/18 2231  DDIMER 0.28     Radiology    Dg Chest 2 View  Result Date: 01/21/2018 CLINICAL DATA:  Shortness of breath EXAM: CHEST - 2 VIEW COMPARISON:  12/03/2016 FINDINGS: No focal opacity or pleural effusion. Cardiomediastinal silhouette within normal limits. No pneumothorax. Aortic atherosclerosis. IMPRESSION: No active cardiopulmonary disease. Electronically Signed   By: Donavan Foil M.D.   On: 01/21/2018 18:52    Cardiac Studies   Echocardiogram 01/22/18: Study Conclusions  - Left ventricle: The cavity size was normal. Wall thickness was   increased in a pattern of mild LVH. Systolic function was normal.   The estimated ejection fraction was in the range of 55% to 60%.   Wall motion was normal; there were no regional wall motion   abnormalities. The study was not technically sufficient to allow   evaluation of LV diastolic dysfunction due to atrial   fibrillation. - Aortic valve: There was no stenosis. - Aorta: Borderline dilated aortic root. Aortic root dimension: 37   mm (ED). - Mitral valve: Mildly calcified annulus. There was trivial    regurgitation. - Left atrium: The atrium was moderately dilated. - Right ventricle: The cavity size was normal. Systolic function   was normal. - Right atrium: The atrium was mildly dilated. - Tricuspid valve: Peak RV-RA gradient (S): 35 mm Hg. - Pulmonary arteries: PA peak pressure: 38 mm Hg (S). - Inferior vena cava: The vessel was normal in size. The   respirophasic diameter changes were in the normal range (>= 50%),   consistent with normal central venous pressure.  Impressions:  - The paitent was in atrial fibrillation. Normal LV size with mild   LV hypertrophy. EF 55-60%. Normal RV size and systolic function.   Mild pulmonary hypertension.  Patient Profile     77 y.o. male with CAD s/p PCI to the RCA 1999, HTN, permanent AF with slow VR on apixiban (last dose 11/5 PM), colon cancer s/p R colectomy and chemotherapy now in remission who presents with indigestion and elevated biomarkers concerning for NSTEMI.   Assessment & Plan    1. NSTEMI in patient with known CAD s/p PCI to RCA: Patient presented with chest pain c/w discomfort felt prior to last intervention to RCA in 1999. Troponin peaked at 0.57. EKG with new anterolateral T wave changes. Home eliquis held and he was started on a heparin gtt yesterday in anticipation of LHC today.  - Keep NPO for LHC today  2. HTN: BP above goal but stable - Continue current regimen for now  3. Atrial fibrillation: baseline bradycardia not on AV nodal blocking agents. Home eliquis held in anticipation of LHC today.  - Continue heparin gtt for now - anticipate transition back to eliquis post cath  4. HLD: LDL 71 01/2018 - Atorvastatin increased to 80mg  daily this admission    For questions or updates, please contact Echo Please consult [email or web address] for contact info under Cardiology/STEMI.      Signed, Abigail Butts, PA-C  01/23/2018, 7:49 AM   [phone]  Patient examined chart reviewed. Less anxious this am .  Exam with clear lungs no murmur soft abdomen good right radial pulse and no edema. He is 4 th case in lab with Dr End  Eliquis held on heparin in lieu of needing invasive Procedure Will need to resume eliquis after cath Rate control is good tends toward slow side Will consider adding ACE for BP after cath   [facility]

## 2018-01-23 NOTE — Progress Notes (Signed)
ANTICOAGULATION CONSULT NOTE - Follow Up Consult  Pharmacy Consult for Heparin Indication: chest pain/ACS  No Known Allergies  Patient Measurements: Height: 6\' 3"  (190.5 cm) Weight: 202 lb 13.2 oz (92 kg) IBW/kg (Calculated) : 84.5 Heparin Dosing Weight:   Vital Signs: Temp: 97.7 F (36.5 C) (11/07 0512) Temp Source: Oral (11/07 0512) BP: 141/71 (11/07 0512) Pulse Rate: 57 (11/06 1950)  Labs: Recent Labs    01/21/18 1844 01/21/18 1920 01/22/18 0347 01/22/18 0357 01/22/18 1141 01/22/18 1236 01/22/18 1854 01/23/18 0403  HGB 15.5  --   --   --   --   --   --  14.9  HCT 47.5  --   --   --   --   --   --  45.2  PLT 183  --   --   --   --   --   --  154  APTT  --   --   --   --  41*  --  60* 108*  HEPARINUNFRC  --   --   --   --  0.93*  --   --  0.81*  CREATININE  --  0.85 0.78  --   --   --   --   --   TROPONINI  --   --   --  0.56*  --  0.57* 0.46*  --     Estimated Creatinine Clearance: 96.8 mL/min (by C-G formula based on SCr of 0.78 mg/dL).   Medical History: Past Medical History:  Diagnosis Date  . A-fib ([place])   . Allergy    Seasonal--pollen  . Anticoagulation goal of INR 2 to 3    on coumadin  . CAD (coronary artery disease) 1999   RCA stent  . Carotid stenosis, asymptomatic    moderate RT and mild LT with carotid dopplers 10/26/11  . Colon cancer ([place]) 04/2006   Stage III--T3 N1  . COPD (chronic obstructive pulmonary disease) ([place])   . H/O cardiovascular stress test 01/13/2010   low risk scan, similar to 2008 study  . H/O echocardiogram 05/23/2009   EF >55%, mild MR, Mild TR,   . Hand foot syndrome   . Hemorrhoids   . Hyperlipidemia   . Hypertension   . Neuropathy   . Permanent atrial fibrillation   . Psoriasis   . Tubular adenoma of colon     Medications:  Medications Prior to Admission  Medication Sig Dispense Refill Last Dose  . Alpha Lipoic Acid 200 MG CAPS Take 600 mg by mouth 2 (two) times daily.    01/21/2018 at Unknown time  . aspirin 81  MG tablet Take 81 mg by mouth daily.   01/20/2018 at Unknown time  . cholecalciferol (VITAMIN D) 1000 UNITS tablet Take 1,000 Units by mouth daily.   01/21/2018 at Unknown time  . Coenzyme Q10 200 MG capsule Take 100 mg by mouth 2 (two) times daily.    01/21/2018 at Unknown time  . Cyanocobalamin (VITAMIN B 12 PO) Take 100 mg by mouth daily.    01/21/2018 at Unknown time  . ELIQUIS 5 MG TABS tablet TAKE 1 TABLET BY MOUTH TWICE DAILY. 180 tablet 0 01/21/2018 at 500 pm  . Flaxseed, Linseed, (FLAX SEED OIL PO) Take 1,400 mg by mouth daily.   01/21/2018 at Unknown time  . Omega-3 Fatty Acids (FISH OIL) 1200 MG CAPS Take 1,000 mg by mouth daily.    01/21/2018 at Unknown time  . simvastatin (ZOCOR) 80  MG tablet Take 1 tablet (80 mg total) by mouth at bedtime. KEEP OV. 90 tablet 0 01/20/2018 at Unknown time  . vitamin C (ASCORBIC ACID) 500 MG tablet Take 1,000 mg by mouth 2 (two) times daily.   01/21/2018 at Unknown time   Infusions:  . sodium chloride    . sodium chloride 50 mL/hr at 01/22/18 2042  . heparin      Assessment: Patient with Hx CAD/PCI 1999 admitted with NSTEMI.  Patient on apixaban prior to admission for Hx Afib with last dose noted per med rec 11/5 at 1700. Heparin drip started 1100 uts/hr HL falsely elevated by apixaban, aptt 41sec < goal.  No bleeding noted.  Will use aptt to dose heparin until apixaban cleared and aptt and HL correltate  11/7 AM update: aPTT and heparin level are just above goal, they are close to correlating, will check both aPTT and heparin level one more time together later today after rate decrease  Goal of Therapy:  Heparin level 0.3-0.7 units/ml aPTT 66-102 seconds Monitor platelets by anticoagulation protocol: Yes   Plan:  Decrease heparin to 1450 units/hr Re-check aPTT/HL in 8 hours  Daily CBC, HL, aptt  Narda Bonds, PharmD, BCPS Clinical Pharmacist Phone: [phone]

## 2018-01-23 NOTE — Interval H&P Note (Signed)
History and Physical Interval Note:  01/23/2018 10:38 AM  [name]  has presented today for cardiac catheterization, with the diagnosis of NSTEMI. The various methods of treatment have been discussed with the patient and family. After consideration of risks, benefits and other options for treatment, the patient has consented to  Procedure(s): LEFT HEART CATH AND CORONARY ANGIOGRAPHY (N/A) as a surgical intervention .  The patient's history has been reviewed, patient examined, no change in status, stable for surgery.  I have reviewed the patient's chart and labs.  Questions were answered to the patient's satisfaction.    Cath Lab Visit (complete for each Cath Lab visit)  Clinical Evaluation Leading to the Procedure:   ACS: Yes.    Non-ACS:  N/A  Brandom Kerwin

## 2018-01-23 NOTE — Consult Note (Signed)
[address]       [place] [address]             [phone]        Maikol H Loredo Upton Medical Record #[id number] Date of Birth: 1941-01-29  Referring: End Primary Care: Lavone Orn, MD Primary Cardiologist:Jonathan Gwenlyn Found, MD  Chief Complaint:    Chief Complaint  Patient presents with  . Shortness of Breath  . chest pain   History of Present Illness:      [name] is a 77 yo white male with known history of Hyperlipidemia, HTN, Carotid Artery Stenosis, Chronic Atrial Fibrillation on Eliquis, and Colon Cancer S/P Colon Resection with Chemo and radiation about 8 years ago.  He is very active exercising and attending the gym 5 days per week.  He does weight and cardio training.  He also maintains his yard.  He was in his usual state of health until about a week ago, when he developed a "strange" sensation in his chest.  He states it felt like indigestion, but he knew that it wasn't this.  He states it was not associated with nausea, vomiting, diaphoresis, or shortness of breath.  The patient presented to the ED on 11/5 after the pain did not resolve.  His Troponin level was mildly elevated at 0.43 and he was ruled in for NSTEMI.  He was admitted for further care and cardiac catheterization. This was performed by Dr. Saunders Revel today and showed multivessel CAD.  It was felt coronary bypass grafting would be indicated and TCTS consult was requested.  Currently, the patient was chest pain free.  He again confirmed the above mentioned episodes.  He is a former smoker but quit about 20 years ago.  He remains physically active and understands that he needs to have surgery for his condition.  He has states his carotid disease is 65% on the right and he had a repeat duplex about 1 week ago.     Current Activity/ Functional Status: Patient is independent with mobility/ambulation, transfers, ADL's, IADL's.   Zubrod Score: At the time of surgery this patient's most  appropriate activity status/level should be described as: []     0    Normal activity, no symptoms []     1    Restricted in physical strenuous activity but ambulatory, able to do out light work []     2    Ambulatory and capable of self care, unable to do work activities, up and about                 more than 50%  Of the time                            []     3    Only limited self care, in bed greater than 50% of waking hours []     4    Completely disabled, no self care, confined to bed or chair []     5    Moribund  Past Medical History:  Diagnosis Date  . A-fib ([place])   . Allergy    Seasonal--pollen  . Anticoagulation goal of INR 2 to 3    on coumadin  . CAD (coronary artery disease) 1999   RCA stent  . Carotid stenosis, asymptomatic    moderate RT and mild LT with carotid dopplers 10/26/11  . Colon cancer ([place]) 04/2006   Stage III--T3  N1  . COPD (chronic obstructive pulmonary disease) ([place])   . H/O cardiovascular stress test 01/13/2010   low risk scan, similar to 2008 study  . H/O echocardiogram 05/23/2009   EF >55%, mild MR, Mild TR,   . Hand foot syndrome   . Hemorrhoids   . Hyperlipidemia   . Hypertension   . Neuropathy   . Permanent atrial fibrillation   . Psoriasis   . Tubular adenoma of colon     Past Surgical History:  Procedure Laterality Date  . COLONOSCOPY    . CORONARY STENT PLACEMENT  1999   RCA tandem NIR stents  . LEFT HEART CATH AND CORONARY ANGIOGRAPHY N/A 01/23/2018   Procedure: LEFT HEART CATH AND CORONARY ANGIOGRAPHY;  Surgeon: Nelva Bush, MD;  Location: [facility];  Service: Cardiovascular;  Laterality: N/A;  . RIGHT COLECTOMY  05/29/2006   Laparoscopic assisted  . TONSILLECTOMY      Social History   Tobacco Use  Smoking Status Former Smoker  . Years: 30.00  . Last attempt to quit: 11/25/1993  . Years since quitting: 24.1  Smokeless Tobacco Never Used    Social History   Substance and Sexual Activity  Alcohol Use Yes  .  Alcohol/week: 1.0 standard drinks  . Types: 1 Cans of beer per week     No Known Allergies  Current Facility-Administered Medications  Medication Dose Route Frequency Provider Last Rate Last Dose  . 0.9 %  sodium chloride infusion   Intravenous Continuous End, Christopher, MD 75 mL/hr at 01/23/18 1240    . 0.9 %  sodium chloride infusion  250 mL Intravenous PRN End, Harrell Gave, MD      . ALPRAZolam Duanne Moron) tablet 0.25 mg  0.25 mg Oral BID PRN End, Harrell Gave, MD   0.25 mg at 01/23/18 0543  . aspirin EC tablet 81 mg  81 mg Oral Daily End, Christopher, MD   81 mg at 01/23/18 0734  . atorvastatin (LIPITOR) tablet 40 mg  40 mg Oral q1800 End, Christopher, MD   40 mg at 01/22/18 1837  . cholecalciferol (VITAMIN D) tablet 1,000 Units  1,000 Units Oral Daily End, Christopher, MD   1,000 Units at 01/23/18 1000  . heparin ADULT infusion 100 units/mL (25000 units/276mL sodium chloride 0.45%)  1,400 Units/hr Intravenous Continuous Sueanne Margarita, MD      . Derrill Memo ON 01/24/2018] Influenza vac split quadrivalent PF (FLUZONE HIGH-DOSE) injection 0.5 mL  0.5 mL Intramuscular Tomorrow-1000 Turner, Traci R, MD      . omega-3 acid ethyl esters (LOVAZA) capsule 1 g  1 g Oral Daily End, Christopher, MD   1 g at 01/23/18 1000  . [START ON 01/24/2018] pneumococcal 23 valent vaccine (PNU-IMMUNE) injection 0.5 mL  0.5 mL Intramuscular Tomorrow-1000 Turner, Traci R, MD      . sodium chloride flush (NS) 0.9 % injection 3 mL  3 mL Intravenous Q12H End, Christopher, MD      . sodium chloride flush (NS) 0.9 % injection 3 mL  3 mL Intravenous PRN End, Christopher, MD      . vitamin B-12 (CYANOCOBALAMIN) tablet 100 mcg  100 mcg Oral Daily End, Christopher, MD   100 mcg at 01/23/18 1000    Medications Prior to Admission  Medication Sig Dispense Refill Last Dose  . Alpha Lipoic Acid 200 MG CAPS Take 600 mg by mouth 2 (two) times daily.    01/21/2018 at Unknown time  . aspirin 81 MG tablet Take 81 mg by mouth  daily.    01/20/2018 at Unknown time  . cholecalciferol (VITAMIN D) 1000 UNITS tablet Take 1,000 Units by mouth daily.   01/21/2018 at Unknown time  . Coenzyme Q10 200 MG capsule Take 100 mg by mouth 2 (two) times daily.    01/21/2018 at Unknown time  . Cyanocobalamin (VITAMIN B 12 PO) Take 100 mg by mouth daily.    01/21/2018 at Unknown time  . ELIQUIS 5 MG TABS tablet TAKE 1 TABLET BY MOUTH TWICE DAILY. 180 tablet 0 01/21/2018 at 500 pm  . Flaxseed, Linseed, (FLAX SEED OIL PO) Take 1,400 mg by mouth daily.   01/21/2018 at Unknown time  . Omega-3 Fatty Acids (FISH OIL) 1200 MG CAPS Take 1,000 mg by mouth daily.    01/21/2018 at Unknown time  . simvastatin (ZOCOR) 80 MG tablet Take 1 tablet (80 mg total) by mouth at bedtime. KEEP OV. 90 tablet 0 01/20/2018 at Unknown time  . vitamin C (ASCORBIC ACID) 500 MG tablet Take 1,000 mg by mouth 2 (two) times daily.   01/21/2018 at Unknown time    Family History  Problem Relation Age of Onset  . Heart disease Father 33  . Heart disease Paternal Grandmother   . Heart disease Mother   . Hypertension Mother   . Healthy Sister   . Heart disease Paternal Grandfather   . Hypertension Paternal Grandfather   . Colon cancer Neg Hx    Review of Systems:   ROS Constitutional: negative Eyes: negative Respiratory: negative Cardiovascular: positive for chest pressure/discomfort and irregular heart beat Gastrointestinal: negative for nausea and vomiting Hematologic/lymphatic: positive for varicose veins bilaterally Neurological: negative     Cardiac Review of Systems: Y or  [    ]= no  Chest Pain [ y   ]  Resting SOB [ N  ] Exertional SOB  [ N ]  Orthopnea [  ]   Pedal Edema [   ]    Palpitations [  ] Syncope  [  ]   Presyncope [   ]  General Review of Systems: [Y] = yes [  ]=no Constitional: recent weight change Aqua.Slicker  ]; anorexia [  ]; fatigue [  ]; nausea Aqua.Slicker  ]; night sweats [  ]; fever [  ]; or chills [  ]                                                                 Dental: Last Dentist visit: has dentures  Eye : blurred vision [  ]; diplopia [   ]; vision changes [  ];  Amaurosis fugax[  ]; Resp: cough Aqua.Slicker  ];  wheezing[N  ];  hemoptysis[  ]; shortness of breath[  ]; paroxysmal nocturnal dyspnea[  ]; dyspnea on exertion[ N ]; or orthopnea[  ];  GI:  gallstones[  ], vomiting[N];  dysphagia[  ]; melena[  ];  hematochezia [  ]; heartburn[  ];   Hx of  Colonoscopy[  ]; GU: kidney stones [  ]; hematuria[  ];   dysuria [  ];  nocturia[  ];  history of     obstruction [  ]; urinary frequency [  ]             Skin: rash,  swelling[  ];, hair loss[  ];  peripheral edema[  ];  or itching[  ]; +varicose veins Musculosketetal: myalgias[  ];  joint swelling[  ];  joint erythema[  ];  joint pain[  ];  back pain[  ];  Heme/Lymph: bruising[  ];  bleeding[  ];  anemia[  ];  Neuro: TIA[  ];  headaches[ N ];  stroke[ N ];  vertigo[  ];  seizures[  ];   paresthesias[  ];  difficulty walking[  ];  Psych:depression[  ]; anxiety[  ];  Endocrine: diabetes[N  ];  thyroid dysfunction[N  ];  Physical Exam: BP (!) 138/56   Pulse (!) 39   Temp 97.7 F (36.5 C) (Oral)   Resp 19   Ht 6\' 3"  (1.905 m)   Wt 92 kg   SpO2 96%   BMI 25.35 kg/m    General appearance: alert, cooperative and no distress Head: Normocephalic, without obvious abnormality, atraumatic Neck: no adenopathy, no JVD, supple, symmetrical, trachea midline, thyroid not enlarged, symmetric, no tenderness/mass/nodules and + carotid bruit Resp: clear to auscultation bilaterally Cardio: regular rate and rhythm and bradycardia GI: soft, non-tender; bowel sounds normal; no masses,  no organomegaly Extremities: extremities normal, atraumatic, no cyanosis or edema and varicose veins noted Neurologic: Grossly normal  Diagnostic Studies & Laboratory data:     Recent Radiology Findings:   Dg Chest 2 View  Result Date: 01/21/2018 CLINICAL DATA:  Shortness of breath EXAM: CHEST - 2 VIEW COMPARISON:  12/03/2016  FINDINGS: No focal opacity or pleural effusion. Cardiomediastinal silhouette within normal limits. No pneumothorax. Aortic atherosclerosis. IMPRESSION: No active cardiopulmonary disease. Electronically Signed   By: Donavan Foil M.D.   On: 01/21/2018 18:52   Cardiac Catheterization:  1. Severe multivessel coronary artery disease, including calcified 90% stenoses involving the LMCA, LAD, and RCA. 2. Patent stent in the mid RCA; 90% mid RCA stenosis is located just proximal to the stent. 3. Normal left ventricular systolic function and filling pressure.   I have independently reviewed the above radiologic studies and discussed with the patient   Recent Lab Findings: Lab Results  Component Value Date   WBC 7.7 01/23/2018   HGB 14.9 01/23/2018   HCT 45.2 01/23/2018   PLT 154 01/23/2018   GLUCOSE 91 01/22/2018   CHOL 135 01/22/2018   TRIG 71 01/22/2018   HDL 50 01/22/2018   LDLCALC 71 01/22/2018   ALT 46 (H) 01/21/2018   AST 43 (H) 01/21/2018   NA 139 01/22/2018   K 3.8 01/22/2018   CL 106 01/22/2018   CREATININE 0.78 01/22/2018   BUN 16 01/22/2018   CO2 26 01/22/2018   INR 3.6 11/22/2015   HGBA1C 5.3 01/22/2018    Assessment / Plan:      1. CAD- currently chest pain free, will need coronary bypass, likely early next week 2. Chronic Atrial Fibrillation- currently Sinus Bradycardia, on Eliquis preoperatively, last dose was 2 days ago, continue to hold 3. Carotid Artery Stenosis- 65% on right per patient, also had duplex by Dr. Gwenlyn Found about 1 week ago 4. Bilateral varicose veins, will need vein mapping to ensure usable saphenous vein 5. Dispo- patient stable, currently chest pain free, continue to hold Eliquis, will complete preoperative workup, likely for Bypass early next week.  Dr. Cyndia Bent to evaluate and follow up with further recommendations  I  spent 60 minutes counseling the patient face to face.  Erin Barrett PA-C 01/23/2018 2:45 PM

## 2018-01-23 NOTE — Progress Notes (Addendum)
Progress Note  Patient Name: [name] Date of Encounter: 01/23/2018  Primary Cardiologist: [name], MD   Subjective   Feeling a little groggy and weak this mroning. No complaints of chest pain, SOB, dizziness, or lightheadedness.  Inpatient Medications    Scheduled Meds: . aspirin EC  81 mg Oral Daily  . atorvastatin  40 mg Oral q1800  . cholecalciferol  1,000 Units Oral Daily  . Influenza vac split quadrivalent PF  0.5 mL Intramuscular Tomorrow-1000  . omega-3 acid ethyl esters  1 g Oral Daily  . pneumococcal 23 valent vaccine  0.5 mL Intramuscular Tomorrow-1000  . sodium chloride flush  3 mL Intravenous Q12H  . vitamin B-12  100 mcg Oral Daily   Continuous Infusions: . sodium chloride    . sodium chloride 50 mL/hr at 01/22/18 2042  . heparin 1,450 Units/hr (01/23/18 0647)   PRN Meds: sodium chloride, ALPRAZolam, sodium chloride flush   Vital Signs    Vitals:   01/22/18 0728 01/22/18 1430 01/22/18 1950 01/23/18 0512  BP: (!) 147/58 (!) 137/57 134/68 (!) 141/71  Pulse: (!) 46 (!) 56 (!) 57   Resp: 18 19    Temp: 98 F (36.7 C) 98.2 F (36.8 C) (!) 97.5 F (36.4 C) 97.7 F (36.5 C)  TempSrc: Oral Oral Oral Oral  SpO2: 98% 96% 98% 98%  Weight:    92 kg  Height:        Intake/Output Summary (Last 24 hours) at 01/23/2018 0749 Last data filed at 01/23/2018 0019 Gross per 24 hour  Intake 105.6 ml  Output 1800 ml  Net -1694.4 ml   Filed Weights   01/21/18 1807 01/22/18 0154 01/23/18 0512  Weight: 91.2 kg 92 kg 92 kg    Telemetry    Atrial fibrillation with CVR/SVR - Personally Reviewed  Physical Exam   GEN: Laying in bed in no acute distress.   Neck: No JVD, no carotid bruits Cardiac: IRIR, no murmurs, rubs, or gallops.  Respiratory: Clear to auscultation bilaterally, no wheezes/ rales/ rhonchi GI: NABS, Soft, nontender, non-distended  MS: No edema; No deformity. Neuro:  Nonfocal, moving all extremities spontaneously Psych: Normal  affect   Labs    Chemistry Recent Labs  Lab 01/21/18 1920 01/22/18 0347  NA 140 139  K 4.0 3.8  CL 105 106  CO2 24 26  GLUCOSE 104* 91  BUN 21 16  CREATININE 0.85 0.78  CALCIUM 9.2 8.9  PROT 6.6  --   ALBUMIN 4.2  --   AST 43*  --   ALT 46*  --   ALKPHOS 60  --   BILITOT 1.1  --   GFRNONAA >60 >60  GFRAA >60 >60  ANIONGAP 11 7     Hematology Recent Labs  Lab 01/21/18 1844 01/23/18 0403  WBC 7.5 7.7  RBC 4.82 4.62  HGB 15.5 14.9  HCT 47.5 45.2  MCV 98.5 97.8  MCH 32.2 32.3  MCHC 32.6 33.0  RDW 12.4 12.5  PLT 183 154    Cardiac Enzymes Recent Labs  Lab 01/22/18 0357 01/22/18 1236 01/22/18 1854  TROPONINI 0.56* 0.57* 0.46*    Recent Labs  Lab 01/21/18 1851 01/21/18 2206  TROPIPOC 0.06 0.43*     BNPNo results for input(s): BNP, PROBNP in the last 168 hours.   DDimer  Recent Labs  Lab 01/21/18 2231  DDIMER 0.28     Radiology    Dg Chest 2 View  Result Date: 01/21/2018 CLINICAL DATA:  Shortness of breath EXAM: CHEST - 2 VIEW COMPARISON:  12/03/2016 FINDINGS: No focal opacity or pleural effusion. Cardiomediastinal silhouette within normal limits. No pneumothorax. Aortic atherosclerosis. IMPRESSION: No active cardiopulmonary disease. Electronically Signed   By: Donavan Foil M.D.   On: 01/21/2018 18:52    Cardiac Studies   Echocardiogram 01/22/18: Study Conclusions  - Left ventricle: The cavity size was normal. Wall thickness was   increased in a pattern of mild LVH. Systolic function was normal.   The estimated ejection fraction was in the range of 55% to 60%.   Wall motion was normal; there were no regional wall motion   abnormalities. The study was not technically sufficient to allow   evaluation of LV diastolic dysfunction due to atrial   fibrillation. - Aortic valve: There was no stenosis. - Aorta: Borderline dilated aortic root. Aortic root dimension: 37   mm (ED). - Mitral valve: Mildly calcified annulus. There was trivial    regurgitation. - Left atrium: The atrium was moderately dilated. - Right ventricle: The cavity size was normal. Systolic function   was normal. - Right atrium: The atrium was mildly dilated. - Tricuspid valve: Peak RV-RA gradient (S): 35 mm Hg. - Pulmonary arteries: PA peak pressure: 38 mm Hg (S). - Inferior vena cava: The vessel was normal in size. The   respirophasic diameter changes were in the normal range (>= 50%),   consistent with normal central venous pressure.  Impressions:  - The paitent was in atrial fibrillation. Normal LV size with mild   LV hypertrophy. EF 55-60%. Normal RV size and systolic function.   Mild pulmonary hypertension.  Patient Profile     77 y.o. male with CAD s/p PCI to the RCA 1999, HTN, permanent AF with slow VR on apixiban (last dose 11/5 PM), colon cancer s/p R colectomy and chemotherapy now in remission who presents with indigestion and elevated biomarkers concerning for NSTEMI.   Assessment & Plan    1. NSTEMI in patient with known CAD s/p PCI to RCA: Patient presented with chest pain c/w discomfort felt prior to last intervention to RCA in 1999. Troponin peaked at 0.57. EKG with new anterolateral T wave changes. Home eliquis held and he was started on a heparin gtt yesterday in anticipation of LHC today.  - Keep NPO for LHC today  2. HTN: BP above goal but stable - Continue current regimen for now  3. Atrial fibrillation: baseline bradycardia not on AV nodal blocking agents. Home eliquis held in anticipation of LHC today.  - Continue heparin gtt for now - anticipate transition back to eliquis post cath  4. HLD: LDL 71 01/2018 - Atorvastatin increased to 80mg  daily this admission    For questions or updates, please contact [place] Please consult [email or web address] for contact info under Cardiology/STEMI.      Signed, Abigail Butts, PA-C  01/23/2018, 7:49 AM   [phone]  Patient examined chart reviewed. Less anxious this am .  Exam with clear lungs no murmur soft abdomen good right radial pulse and no edema. He is 4 th case in lab with Dr End  Eliquis held on heparin in lieu of needing invasive Procedure Will need to resume eliquis after cath Rate control is good tends toward slow side Will consider adding ACE for BP after cath   [facility]

## 2018-01-23 NOTE — Progress Notes (Signed)
ANTICOAGULATION CONSULT NOTE - Follow Up Consult  Pharmacy Consult for Heparin Indication: chest pain/ACS/Afib  No Known Allergies  Patient Measurements: Height: 6\' 3"  (190.5 cm) Weight: 202 lb 13.2 oz (92 kg) IBW/kg (Calculated) : 84.5 Heparin Dosing Weight:   Vital Signs: Temp: 97.7 F (36.5 C) (11/07 1300) Temp Source: Oral (11/07 1300) BP: 138/56 (11/07 1406) Pulse Rate: 39 (11/07 1406)  Labs: Recent Labs    01/21/18 1844 01/21/18 1920 01/22/18 0347 01/22/18 0357 01/22/18 1141 01/22/18 1236 01/22/18 1854 01/23/18 0403  HGB 15.5  --   --   --   --   --   --  14.9  HCT 47.5  --   --   --   --   --   --  45.2  PLT 183  --   --   --   --   --   --  154  APTT  --   --   --   --  41*  --  60* 108*  HEPARINUNFRC  --   --   --   --  0.93*  --   --  0.81*  CREATININE  --  0.85 0.78  --   --   --   --   --   TROPONINI  --   --   --  0.56*  --  0.57* 0.46*  --     Estimated Creatinine Clearance: 96.8 mL/min (by C-G formula based on SCr of 0.78 mg/dL).   Medical History: Past Medical History:  Diagnosis Date  . A-fib ([place])   . Allergy    Seasonal--pollen  . Anticoagulation goal of INR 2 to 3    on coumadin  . CAD (coronary artery disease) 1999   RCA stent  . Carotid stenosis, asymptomatic    moderate RT and mild LT with carotid dopplers 10/26/11  . Colon cancer ([place]) 04/2006   Stage III--T3 N1  . COPD (chronic obstructive pulmonary disease) ([place])   . H/O cardiovascular stress test 01/13/2010   low risk scan, similar to 2008 study  . H/O echocardiogram 05/23/2009   EF >55%, mild MR, Mild TR,   . Hand foot syndrome   . Hemorrhoids   . Hyperlipidemia   . Hypertension   . Neuropathy   . Permanent atrial fibrillation   . Psoriasis   . Tubular adenoma of colon     Medications:  Medications Prior to Admission  Medication Sig Dispense Refill Last Dose  . Alpha Lipoic Acid 200 MG CAPS Take 600 mg by mouth 2 (two) times daily.    01/21/2018 at Unknown time  .  aspirin 81 MG tablet Take 81 mg by mouth daily.   01/20/2018 at Unknown time  . cholecalciferol (VITAMIN D) 1000 UNITS tablet Take 1,000 Units by mouth daily.   01/21/2018 at Unknown time  . Coenzyme Q10 200 MG capsule Take 100 mg by mouth 2 (two) times daily.    01/21/2018 at Unknown time  . Cyanocobalamin (VITAMIN B 12 PO) Take 100 mg by mouth daily.    01/21/2018 at Unknown time  . ELIQUIS 5 MG TABS tablet TAKE 1 TABLET BY MOUTH TWICE DAILY. 180 tablet 0 01/21/2018 at 500 pm  . Flaxseed, Linseed, (FLAX SEED OIL PO) Take 1,400 mg by mouth daily.   01/21/2018 at Unknown time  . Omega-3 Fatty Acids (FISH OIL) 1200 MG CAPS Take 1,000 mg by mouth daily.    01/21/2018 at Unknown time  . simvastatin (ZOCOR) 80  MG tablet Take 1 tablet (80 mg total) by mouth at bedtime. KEEP OV. 90 tablet 0 01/20/2018 at Unknown time  . vitamin C (ASCORBIC ACID) 500 MG tablet Take 1,000 mg by mouth 2 (two) times daily.   01/21/2018 at Unknown time   Infusions:  . sodium chloride 75 mL/hr at 01/23/18 1240  . sodium chloride    . heparin      Assessment: Patient with Hx CAD/PCI 1999 admitted with NSTEMI.  Patient on apixaban prior to admission for Hx Afib with last dose noted per med rec 11/5 at 1700. Heparin drip started 1100 uts/hr HL falsely elevated by apixaban, aptt 41sec < goal.  No bleeding noted.  Will use aptt to dose heparin until apixaban cleared and aptt and HL correltate  S/p cath 3V CAD plan restart heparin until CABG Restart 2hr after TR band off - off at 1430, restart at 1630  Goal of Therapy:  Heparin level 0.3-0.7 units/ml aPTT 66-102 seconds Monitor platelets by anticoagulation protocol: Yes   Plan:  Restart  heparin to 1400 units/hr Re-check aPTT/HL in 6 hours  Daily CBC, HL, aptt  Bonnita Nasuti Pharm.D. CPP, BCPS Clinical Pharmacist [phone] 01/23/2018 2:31 PM

## 2018-01-24 ENCOUNTER — Inpatient Hospital Stay (HOSPITAL_COMMUNITY): Payer: Medicare Other

## 2018-01-24 ENCOUNTER — Encounter (HOSPITAL_COMMUNITY): Payer: Medicare Other

## 2018-01-24 DIAGNOSIS — Z01810 Encounter for preprocedural cardiovascular examination: Secondary | ICD-10-CM

## 2018-01-24 LAB — CBC
HCT: 45.1 % (ref 39.0–52.0)
Hemoglobin: 14.9 g/dL (ref 13.0–17.0)
MCH: 32.5 pg (ref 26.0–34.0)
MCHC: 33 g/dL (ref 30.0–36.0)
MCV: 98.3 fL (ref 80.0–100.0)
PLATELETS: 160 10*3/uL (ref 150–400)
RBC: 4.59 MIL/uL (ref 4.22–5.81)
RDW: 12.5 % (ref 11.5–15.5)
WBC: 7.9 10*3/uL (ref 4.0–10.5)
nRBC: 0 % (ref 0.0–0.2)

## 2018-01-24 LAB — BASIC METABOLIC PANEL
ANION GAP: 10 (ref 5–15)
BUN: 15 mg/dL (ref 8–23)
CALCIUM: 9 mg/dL (ref 8.9–10.3)
CHLORIDE: 107 mmol/L (ref 98–111)
CO2: 23 mmol/L (ref 22–32)
CREATININE: 0.89 mg/dL (ref 0.61–1.24)
GFR calc Af Amer: >60 mL/min (ref >60)
GFR calc non Af Amer: >60 mL/min (ref >60)
GLUCOSE: 94 mg/dL (ref 70–99)
Potassium: 4 mmol/L (ref 3.5–5.1)
Sodium: 140 mmol/L (ref 135–145)

## 2018-01-24 LAB — APTT: aPTT: 76 seconds — ABNORMAL HIGH (ref 24–36)

## 2018-01-24 LAB — HEPARIN LEVEL (UNFRACTIONATED): HEPARIN UNFRACTIONATED: 0.65 [IU]/mL (ref 0.30–0.70)

## 2018-01-24 MED ORDER — ATORVASTATIN CALCIUM 80 MG PO TABS
80.0000 mg | ORAL_TABLET | Freq: Every day | ORAL | Status: DC
  Administered 2018-01-24 – 2018-01-27 (×4): 80 mg via ORAL
  Filled 2018-01-24 (×4): qty 1

## 2018-01-24 NOTE — Plan of Care (Signed)
  Problem: Pain Managment: Goal: General experience of comfort will improve Outcome: Completed/Met   Problem: Skin Integrity: Goal: Risk for impaired skin integrity will decrease Outcome: Completed/Met

## 2018-01-24 NOTE — Progress Notes (Signed)
ANTICOAGULATION CONSULT NOTE - Follow Up Consult  Pharmacy Consult for Heparin Indication: chest pain/ACS  No Known Allergies  Patient Measurements: Height: 6\' 3"  (190.5 cm) Weight: 209 lb 3.5 oz (94.9 kg) IBW/kg (Calculated) : 84.5 Heparin Dosing Weight: 92 kg  Vital Signs: Temp: 98.6 F (37 C) (11/08 0500) Temp Source: Oral (11/08 0848) BP: 153/66 (11/08 1138) Pulse Rate: 86 (11/08 1138)  Labs: Recent Labs    01/21/18 1844 01/21/18 1920 01/22/18 0347 01/22/18 0357  01/22/18 1236 01/22/18 1854 01/23/18 0403 01/23/18 1438 01/23/18 2220 01/24/18 0209 01/24/18 0744 01/24/18 0829  HGB 15.5  --   --   --   --   --   --  14.9  --   --  14.9  --   --   HCT 47.5  --   --   --   --   --   --  45.2  --   --  45.1  --   --   PLT 183  --   --   --   --   --   --  154  --   --  160  --   --   APTT  --   --   --   --    < >  --  60* 108* 33 49*  --  76*  --   HEPARINUNFRC  --   --   --   --    < >  --   --  0.81*  --  0.38  --  0.65  --   CREATININE  --  0.85 0.78  --   --   --   --   --   --   --   --   --  0.89  TROPONINI  --   --   --  0.56*  --  0.57* 0.46*  --   --   --   --   --   --    < > = values in this interval not displayed.    Estimated Creatinine Clearance: 87 mL/min (by C-G formula based on SCr of 0.89 mg/dL).   Medical History: Past Medical History:  Diagnosis Date  . A-fib ([place])   . Allergy    Seasonal--pollen  . Anticoagulation goal of INR 2 to 3    on coumadin  . CAD (coronary artery disease) 1999   RCA stent  . Carotid stenosis, asymptomatic    moderate RT and mild LT with carotid dopplers 10/26/11  . Colon cancer ([place]) 04/2006   Stage III--T3 N1  . COPD (chronic obstructive pulmonary disease) ([place])   . H/O cardiovascular stress test 01/13/2010   low risk scan, similar to 2008 study  . H/O echocardiogram 05/23/2009   EF >55%, mild MR, Mild TR,   . Hand foot syndrome   . Hemorrhoids   . Hyperlipidemia   . Hypertension   . Neuropathy   .  Permanent atrial fibrillation   . Psoriasis   . Tubular adenoma of colon     Medications:  Medications Prior to Admission  Medication Sig Dispense Refill Last Dose  . Alpha Lipoic Acid 200 MG CAPS Take 600 mg by mouth 2 (two) times daily.    01/21/2018 at Unknown time  . aspirin 81 MG tablet Take 81 mg by mouth daily.   01/20/2018 at Unknown time  . cholecalciferol (VITAMIN D) 1000 UNITS tablet Take 1,000 Units by mouth daily.   01/21/2018 at  Unknown time  . Coenzyme Q10 200 MG capsule Take 100 mg by mouth 2 (two) times daily.    01/21/2018 at Unknown time  . Cyanocobalamin (VITAMIN B 12 PO) Take 100 mg by mouth daily.    01/21/2018 at Unknown time  . ELIQUIS 5 MG TABS tablet TAKE 1 TABLET BY MOUTH TWICE DAILY. 180 tablet 0 01/21/2018 at 500 pm  . Flaxseed, Linseed, (FLAX SEED OIL PO) Take 1,400 mg by mouth daily.   01/21/2018 at Unknown time  . Omega-3 Fatty Acids (FISH OIL) 1200 MG CAPS Take 1,000 mg by mouth daily.    01/21/2018 at Unknown time  . simvastatin (ZOCOR) 80 MG tablet Take 1 tablet (80 mg total) by mouth at bedtime. KEEP OV. 90 tablet 0 01/20/2018 at Unknown time  . vitamin C (ASCORBIC ACID) 500 MG tablet Take 1,000 mg by mouth 2 (two) times daily.   01/21/2018 at Unknown time   Infusions:  . sodium chloride    . heparin 1,500 Units/hr (01/24/18 5643)    Assessment: Patient with hx CAD/PCI 1999 admitted with NSTEMI.  Patient on apixaban prior to admission for hx Afib with last dose noted per med rec 11/5 at 1700.  Pt is s/p cath 11/7 showing multivessel disease.  Now with plans for CABG on Tuesday, 11/12.  Heparin level appears to be correlating with aptt now with Apixiban out of system.  Both are therapeutic on 1500 units/hr.  No change.  Goal of Therapy:  Heparin level goal 0.3-0.7 Monitor platelets by anticoagulation protocol: Yes   Plan:  Continue heparin at 1500 unitshr Daily heparin level and CBC CABG next week  [employer], Pharm.D., BCPS Clinical  Pharmacist Pager: [phone] Clinical phone for 01/24/2018 from 8:30-4:00 is [phone].  **Pharmacist phone directory can now be found on [email or web address] ([email or web address]).  Listed under [place].  01/24/2018 2:42 PM

## 2018-01-24 NOTE — Progress Notes (Signed)
CARDIAC REHAB PHASE I   PRE:  Rate/Rhythm: 54 afib  BP:  Supine: 154/83  Sitting:   Standing:    SaO2: 97%RA  MODE:  Ambulation: 420 ft   POST:  Rate/Rhythm: 84 afib  BP:  Supine:   Sitting: 153/66  Standing:    SaO2: 97%RA 1055-1152 Pt walked 420 ft on RA with steady gait and no CP. Glad to be up. Was getting tired of being in bed. Put in recliner after walk. Gave pt IS and he could reach top correctly. Gave in the tube handout and discussed sternal precautions. Discussed importance of IS and mobility after surgery. Gave OHS booklet, care guide and wrote down how to view pre op video. Wife will be available after discharge to be with pt 24/7.   Graylon Good, RN BSN  01/24/2018 11:47 AM

## 2018-01-24 NOTE — Progress Notes (Addendum)
Progress Note  Patient Name: [name] Date of Encounter: 01/24/2018  Primary Cardiologist: [name] Burow, MD   Subjective   No complaints this morning. No recurrence of chest pain since arrival to the ED. Denies SOB or palpitations. Anticipating CABG on Tuesday 11/12.   Inpatient Medications    Scheduled Meds: . aspirin EC  81 mg Oral Daily  . atorvastatin  40 mg Oral q1800  . cholecalciferol  1,000 Units Oral Daily  . Influenza vac split quadrivalent PF  0.5 mL Intramuscular Tomorrow-1000  . omega-3 acid ethyl esters  1 g Oral Daily  . pneumococcal 23 valent vaccine  0.5 mL Intramuscular Tomorrow-1000  . sodium chloride flush  3 mL Intravenous Q12H  . vitamin B-12  100 mcg Oral Daily   Continuous Infusions: . sodium chloride    . heparin 1,500 Units/hr (01/24/18 0619)   PRN Meds: sodium chloride, sodium chloride flush, temazepam   Vital Signs    Vitals:   01/23/18 1406 01/23/18 2125 01/24/18 0000 01/24/18 0500  BP: (!) 138/56 (!) 114/57 (!) 155/80 (!) 118/50  Pulse: (!) 39 (!) 54 (!) 58 (!) 53  Resp: 19 12 (!) 22 20  Temp:  98.3 F (36.8 C) 98 F (36.7 C) 98.6 F (37 C)  TempSrc:  Oral Oral Oral  SpO2: 96% 98% 98% 96%  Weight:    94.9 kg  Height:        Intake/Output Summary (Last 24 hours) at 01/24/2018 0750 Last data filed at 01/24/2018 0619 Gross per 24 hour  Intake 603.84 ml  Output 700 ml  Net -96.16 ml   Filed Weights   01/22/18 0154 01/23/18 0512 01/24/18 0500  Weight: 92 kg 92 kg 94.9 kg    Telemetry    Atrial fibrillation with SVR - Personally Reviewed  Physical Exam   GEN: No acute distress.   Neck: No JVD, no carotid bruits Cardiac: IRRR, no murmurs, rubs, or gallops. Right radial cath site C/D/I without ecchymosis or hematoma Respiratory: Clear to auscultation bilaterally, no wheezes/ rales/ rhonchi GI: NABS, Soft, nontender, non-distended  MS: No edema; No deformity. Neuro:  Nonfocal, moving all extremities  spontaneously Psych: Normal affect   Labs    Chemistry Recent Labs  Lab 01/21/18 1920 01/22/18 0347  NA 140 139  K 4.0 3.8  CL 105 106  CO2 24 26  GLUCOSE 104* 91  BUN 21 16  CREATININE 0.85 0.78  CALCIUM 9.2 8.9  PROT 6.6  --   ALBUMIN 4.2  --   AST 43*  --   ALT 46*  --   ALKPHOS 60  --   BILITOT 1.1  --   GFRNONAA >60 >60  GFRAA >60 >60  ANIONGAP 11 7     Hematology Recent Labs  Lab 01/21/18 1844 01/23/18 0403 01/24/18 0209  WBC 7.5 7.7 7.9  RBC 4.82 4.62 4.59  HGB 15.5 14.9 14.9  HCT 47.5 45.2 45.1  MCV 98.5 97.8 98.3  MCH 32.2 32.3 32.5  MCHC 32.6 33.0 33.0  RDW 12.4 12.5 12.5  PLT 183 154 160    Cardiac Enzymes Recent Labs  Lab 01/22/18 0357 01/22/18 1236 01/22/18 1854  TROPONINI 0.56* 0.57* 0.46*    Recent Labs  Lab 01/21/18 1851 01/21/18 2206  TROPIPOC 0.06 0.43*     BNPNo results for input(s): BNP, PROBNP in the last 168 hours.   DDimer  Recent Labs  Lab 01/21/18 2231  DDIMER 0.28     Radiology  No results found.  Cardiac Studies   Left heart catheterization 01/23/18: Conclusions: 1. Severe multivessel coronary artery disease, including calcified 90% stenoses involving the LMCA, LAD, and RCA. 2. Patent stent in the mid RCA; 90% mid RCA stenosis is located just proximal to the stent. 3. Normal left ventricular systolic function and filling pressure.  Recommendations: 1. Inpatient cardiac surgery consultation for CABG. 2. Restart heparin infusion 2 hours after deflation of TR band. 3. Aggressive secondary prevention.  Recommend to resume apixaban following CABG.  Recommend concurrent antiplatelet therapy of aspirin 81mg  daily for at least 12 months.  Patient Profile     77 y.o.malewith CAD s/p PCI to the RCA 1999, HTN, permanent AF with slow VR on apixiban (last dose 11/5 PM), colon cancer s/p R colectomy and chemotherapy now in remission who presents with indigestion and elevated biomarkers concerning for  NSTEMI.  Assessment & Plan    1. NSTEMI in patient with known CAD s/p PCI to RCA: p/w chest pain similar to pain felt prior to last intervention to RCA in 1999. Trop peaked at 0.57. EKG with new anterolateral T wave changes. Underwent LHC 01/23/18 which showed severe multivessel CAD including calcified 90% stenosis involving the LMCA,  LAD, and RCA. He was recommended for CABG. CT Surgery evaluated and are recommending surgery next Tuesday 11/12. - Continue heparin gtt in anticipation of upcoming surgery - Continue ASA and statin  2. HTN: BP generally above goal.  - If Cr stable on AM labs, will consider starting lisinopril 10mg  daily  3. HLD: LDL 71 this admission - Will increase atorvastatin to 80mg  daily in light of cath results  4. Atrial fibrillation: baseline SVR. Apixaban on hold in anticipation of upcoming CABG - Continue heparin gtt for now with plans to resume apixaban post CABG   For questions or updates, please contact [name] Please consult [email or web address] for contact info under Cardiology/STEMI.      Signed, [name] Butts, PA-C  01/24/2018, 7:50 AM   [phone]  Patient examined chart reviewed no angina clear lungs no murmur no edema good peripheral pulses Right Radial cath sight healed For CABG [place] continue heparin  [name]

## 2018-01-24 NOTE — Progress Notes (Signed)
Pre-op Cardiac Surgery   Upper Extremity Right Left  Brachial Pressures 180 177  Radial Waveforms triphasic triphasic  Ulnar Waveforms triphasic triphasic  Palmar Arch (Allen's Test) Radial: Decrease 50% Ulnar: Decrease 50% Radial: WNL Ulnar: WNL      Lower  Extremity Right Left  Dorsalis Pedis triphasic triphasic      Posterior Tibial triphasic triphasic          Lower extremity vein mapping complete:     Abram Sander 01/24/2018 11:03 AM

## 2018-01-25 DIAGNOSIS — I25118 Atherosclerotic heart disease of native coronary artery with other forms of angina pectoris: Secondary | ICD-10-CM

## 2018-01-25 DIAGNOSIS — E785 Hyperlipidemia, unspecified: Secondary | ICD-10-CM

## 2018-01-25 LAB — HEPARIN LEVEL (UNFRACTIONATED)
HEPARIN UNFRACTIONATED: 0.78 [IU]/mL — AB (ref 0.30–0.70)
HEPARIN UNFRACTIONATED: 0.82 [IU]/mL — AB (ref 0.30–0.70)
HEPARIN UNFRACTIONATED: 0.48 [IU]/mL (ref 0.30–0.70)

## 2018-01-25 LAB — BASIC METABOLIC PANEL
ANION GAP: 8 (ref 5–15)
BUN: 14 mg/dL (ref 8–23)
CO2: 25 mmol/L (ref 22–32)
Calcium: 9.3 mg/dL (ref 8.9–10.3)
Chloride: 106 mmol/L (ref 98–111)
Creatinine, Ser: 0.79 mg/dL (ref 0.61–1.24)
GFR calc Af Amer: >60 mL/min (ref >60)
GLUCOSE: 97 mg/dL (ref 70–99)
POTASSIUM: 3.8 mmol/L (ref 3.5–5.1)
Sodium: 139 mmol/L (ref 135–145)

## 2018-01-25 LAB — CBC
HCT: 47.8 % (ref 39.0–52.0)
HEMOGLOBIN: 15.8 g/dL (ref 13.0–17.0)
MCH: 32 pg (ref 26.0–34.0)
MCHC: 33.1 g/dL (ref 30.0–36.0)
MCV: 96.8 fL (ref 80.0–100.0)
PLATELETS: 145 10*3/uL — AB (ref 150–400)
RBC: 4.94 MIL/uL (ref 4.22–5.81)
RDW: 12.3 % (ref 11.5–15.5)
WBC: 6.7 10*3/uL (ref 4.0–10.5)
nRBC: 0 % (ref 0.0–0.2)

## 2018-01-25 MED ORDER — LISINOPRIL 5 MG PO TABS
5.0000 mg | ORAL_TABLET | Freq: Every day | ORAL | Status: DC
  Administered 2018-01-25 – 2018-01-27 (×3): 5 mg via ORAL
  Filled 2018-01-25 (×3): qty 1

## 2018-01-25 NOTE — Progress Notes (Signed)
Progress Note  Patient Name: [name] Date of Encounter: 01/25/2018  Primary Cardiologist: [name], MD   Subjective   He said "I feel great ".  He specifically denies chest pain, palpitations, and shortness of breath.  He was very active all of his life and intends on remaining active. Anticipating CABG on Tuesday, 01/28/2018.  Inpatient Medications    Scheduled Meds: . aspirin EC  81 mg Oral Daily  . atorvastatin  80 mg Oral q1800  . cholecalciferol  1,000 Units Oral Daily  . Influenza vac split quadrivalent PF  0.5 mL Intramuscular Tomorrow-1000  . omega-3 acid ethyl esters  1 g Oral Daily  . pneumococcal 23 valent vaccine  0.5 mL Intramuscular Tomorrow-1000  . sodium chloride flush  3 mL Intravenous Q12H  . vitamin B-12  100 mcg Oral Daily   Continuous Infusions: . sodium chloride    . heparin 1,400 Units/hr (01/25/18 0919)   PRN Meds: sodium chloride, sodium chloride flush, temazepam   Vital Signs    Vitals:   01/24/18 1138 01/24/18 2156 01/25/18 0500 01/25/18 0939  BP: (!) 153/66 (!) 149/85 136/77 (!) 164/79  Pulse: 86 61 (!) 53 (!) 52  Resp:  13 19 18   Temp:  97.7 F (36.5 C)    TempSrc:  Oral  Oral  SpO2: 96% 98% 97% 97%  Weight:   101.9 kg   Height:        Intake/Output Summary (Last 24 hours) at 01/25/2018 1129 Last data filed at 01/25/2018 0100 Gross per 24 hour  Intake 756.04 ml  Output 2300 ml  Net -1543.96 ml   Filed Weights   01/23/18 0512 01/24/18 0500 01/25/18 0500  Weight: 92 kg 94.9 kg 101.9 kg    Telemetry    Rate controlled atrial fibrillation, 5 beat run of nonsustained ventricular tachycardia- Personally Reviewed  ECG    No new tracings- Personally Reviewed  Physical Exam   GEN: No acute distress.   Neck: No JVD Cardiac:  Irregular rhythm, normal rate, no murmurs, rubs, or gallops.  Respiratory: Clear to auscultation bilaterally. GI: Soft, nontender, non-distended  MS: No edema; No deformity. Neuro:   Nonfocal  Psych: Normal affect   Labs    Chemistry Recent Labs  Lab 01/21/18 1920 01/22/18 0347 01/24/18 0829 01/25/18 0349  NA 140 139 140 139  K 4.0 3.8 4.0 3.8  CL 105 106 107 106  CO2 24 26 23 25   GLUCOSE 104* 91 94 97  BUN 21 16 15 14   CREATININE 0.85 0.78 0.89 0.79  CALCIUM 9.2 8.9 9.0 9.3  PROT 6.6  --   --   --   ALBUMIN 4.2  --   --   --   AST 43*  --   --   --   ALT 46*  --   --   --   ALKPHOS 60  --   --   --   BILITOT 1.1  --   --   --   GFRNONAA >60 >60 >60 >60  GFRAA >60 >60 >60 >60  ANIONGAP 11 7 10 8      Hematology Recent Labs  Lab 01/23/18 0403 01/24/18 0209 01/25/18 0349  WBC 7.7 7.9 6.7  RBC 4.62 4.59 4.94  HGB 14.9 14.9 15.8  HCT 45.2 45.1 47.8  MCV 97.8 98.3 96.8  MCH 32.3 32.5 32.0  MCHC 33.0 33.0 33.1  RDW 12.5 12.5 12.3  PLT 154 160 145*    Cardiac Enzymes  Recent Labs  Lab 01/22/18 0357 01/22/18 1236 01/22/18 1854  TROPONINI 0.56* 0.57* 0.46*    Recent Labs  Lab 01/21/18 1851 01/21/18 2206  TROPIPOC 0.06 0.43*     BNPNo results for input(s): BNP, PROBNP in the last 168 hours.   DDimer  Recent Labs  Lab 01/21/18 2231  DDIMER 0.28     Radiology    Vas [place] Lower Extremity Saphenous Vein Mapping  Result Date: 01/24/2018 LOWER EXTREMITY VEIN MAPPING Indications: pre cabg  Performing Technologist: Abram Sander  Examination Guidelines: A complete evaluation includes B-mode imaging, spectral Doppler, color Doppler, and power Doppler as needed of all accessible portions of each vessel. Bilateral testing is considered an integral part of a complete examination. Limited examinations for reoccurring indications may be performed as noted. +---------------+-----------+----------------------+---------------+-----------+   RT Diameter  RT Findings         GSV            LT Diameter  LT Findings      (cm)                                            (cm)                   +---------------+-----------+----------------------+---------------+-----------+      0.56                     Saphenofemoral         0.58                                                   Junction                                  +---------------+-----------+----------------------+---------------+-----------+      0.37                     Proximal thigh         0.52                  +---------------+-----------+----------------------+---------------+-----------+      0.40       branching       Mid thigh            0.41       branching  +---------------+-----------+----------------------+---------------+-----------+      0.25                      Distal thigh          0.34       branching  +---------------+-----------+----------------------+---------------+-----------+      0.22                          Knee              0.31                  +---------------+-----------+----------------------+---------------+-----------+      0.23       branching       Prox calf            0.41  branching  +---------------+-----------+----------------------+---------------+-----------+      0.22                        Mid calf            0.31                  +---------------+-----------+----------------------+---------------+-----------+      0.23                      Distal calf           0.33                  +---------------+-----------+----------------------+---------------+-----------+      0.31                         Ankle              0.30                  +---------------+-----------+----------------------+---------------+-----------+ +----------------+--------------+--------------+----------------+--------------+ RT diameter (cm) RT Findings       SSV      LT Diameter (cm) LT Findings   +----------------+--------------+--------------+----------------+--------------+                 not visualized  Popliteal         0.22                                                        fossa                                    +----------------+--------------+--------------+----------------+--------------+                 not visualizedProximal calf                 not visualized +----------------+--------------+--------------+----------------+--------------+                 not visualized   Mid calf                   not visualized +----------------+--------------+--------------+----------------+--------------+                 not visualized Distal calf                  not visualized +----------------+--------------+--------------+----------------+--------------+ Diagnosing physician: Monica Martinez MD Electronically signed by Monica Martinez MD on 01/24/2018 at 1:58:25 PM.    Final    Vas US Doppler Pre Cabg  Result Date: 01/24/2018 PREOPERATIVE VASCULAR EVALUATION  Indications: Pre cabg. Performing Technologist: Abram Sander  Examination Guidelines: A complete evaluation includes B-mode imaging, spectral Doppler, color Doppler, and power Doppler as needed of all accessible portions of each vessel. Bilateral testing is considered an integral part of a complete examination. Limited examinations for reoccurring indications may be performed as noted.  Right Carotid Findings: +----------+--------+-------+--------+------------+           PSV cm/sEDV cmsDescribeArm Pressure +----------+--------+-------+--------+------------+ Subclavian                       180          +----------+--------+-------+--------+------------+  Left Carotid Findings: +----------+--------+--------+--------+------------+  SubclavianPSV cm/sEDV cm/sDescribeArm Pressure +----------+--------+--------+--------+------------+                                   177          +----------+--------+--------+--------+------------+  ABI Findings: +--------+------------------+-----+---------+--------+ Right   Rt Pressure (mmHg)IndexWaveform Comment   +--------+------------------+-----+---------+--------+ Brachial180                    triphasic         +--------+------------------+-----+---------+--------+ PERO                           triphasic         +--------+------------------+-----+---------+--------+ DP                             triphasic         +--------+------------------+-----+---------+--------+ +--------+------------------+-----+---------+-------+ Left    Lt Pressure (mmHg)IndexWaveform Comment +--------+------------------+-----+---------+-------+ [id number]                    triphasic        +--------+------------------+-----+---------+-------+ PERO                           triphasic        +--------+------------------+-----+---------+-------+ DP                             triphasic        +--------+------------------+-----+---------+-------+  Right Doppler Findings: +--------+--------+-----+---------+--------+ Site    PressureIndexDoppler  Comments +--------+--------+-----+---------+--------+ Brachial180          triphasic         +--------+--------+-----+---------+--------+ Radial               triphasic         +--------+--------+-----+---------+--------+ Ulnar                triphasic         +--------+--------+-----+---------+--------+  Left Doppler Findings: +--------+--------+-----+---------+--------+ Site    PressureIndexDoppler  Comments +--------+--------+-----+---------+--------+ [id number]          triphasic         +--------+--------+-----+---------+--------+ Radial               triphasic         +--------+--------+-----+---------+--------+ Ulnar                triphasic         +--------+--------+-----+---------+--------+  Right Upper Extremity: Doppler waveforms decrease 50% with right radial compression. Doppler waveforms decrease 50% with right ulnar compression. Left Upper Extremity: Doppler waveforms remain within normal limits with  left radial compression. Doppler waveforms remain within normal limits with left ulnar compression.  Electronically signed by Monica Martinez MD on 01/24/2018 at 4:59:32 PM.    Final     Cardiac Studies   Left heart catheterization 01/23/18: Conclusions: 1. Severe multivessel coronary artery disease, including calcified 90% stenoses involving the LMCA, LAD, and RCA. 2. Patent stent in the mid RCA; 90% mid RCA stenosis is located just proximal to the stent. 3. Normal left ventricular systolic function and filling pressure.  Recommendations: 1. Inpatient cardiac surgery consultation for CABG. 2. Restart heparin infusion 2 hours after deflation of TR band. 3. Aggressive secondary prevention.  Recommend to resume apixaban following CABG. Recommend concurrent  antiplatelet therapy of aspirin 81mg  daily for at least 12 months.  Patient Profile     77 y.o. male with CAD s/p PCI to the RCA 1999, HTN, permanent AF with slow VR on apixiban (last dose 11/5 PM), colon cancer s/p R colectomy and chemotherapy now in remission who presents with indigestion and elevated biomarkers concerning for NSTEMI.  Assessment & Plan    1. NSTEMI in patient with known CAD s/p PCI to RCA:  He presented with chest pain similar to pain felt prior to last intervention to RCA in 1999. Trop peaked at 0.57. EKG with new anterolateral T wave changes. Underwent LHC 01/23/18 which showed severe multivessel CAD including calcified 90% stenosis involving the LMCA,  LAD, and RCA. He was recommended for CABG. CT Surgery evaluated and are recommending surgery on Tuesday 11/12. - Continue heparin gtt in anticipation of upcoming surgery - Continue ASA and statin  2. HTN:  Blood pressure remains elevated.  I will start lisinopril 5 mg daily.  3. HLD: LDL 71 this admission.  Atorvastatin was increased to 80mg  daily in light of cath results  4. Atrial fibrillation: baseline SVR. Apixaban on hold in anticipation of upcoming  CABG - Continue heparin gtt for now with plans to resume apixaban post CABG   For questions or updates, please contact [place] Please consult [email or web address] for contact info under Cardiology/STEMI.      Signed, Kate Sable, MD  01/25/2018, 11:29 AM

## 2018-01-25 NOTE — Progress Notes (Signed)
ANTICOAGULATION CONSULT NOTE - Follow Up Consult  Pharmacy Consult for Heparin Indication: chest pain/ACS  No Known Allergies  Patient Measurements: Height: 6\' 3"  (190.5 cm) Weight: 224 lb 9.6 oz (101.9 kg) IBW/kg (Calculated) : 84.5 Heparin Dosing Weight: 92 kg  Vital Signs: Temp: 98.3 F (36.8 C) (11/09 1213) Temp Source: Oral (11/09 1213) BP: 139/68 (11/09 1213) Pulse Rate: 57 (11/09 1213)  Labs: Recent Labs    01/22/18 1854  01/23/18 0403 01/23/18 1438 01/23/18 2220 01/24/18 0209 01/24/18 0744 01/24/18 0829 01/25/18 0349 01/25/18 1314  HGB  --    < > 14.9  --   --  14.9  --   --  15.8  --   HCT  --   --  45.2  --   --  45.1  --   --  47.8  --   PLT  --   --  154  --   --  160  --   --  145*  --   APTT 60*  --  108* 33 49*  --  76*  --   --   --   HEPARINUNFRC  --    < > 0.81*  --  0.38  --  0.65  --  0.78* 0.82*  CREATININE  --   --   --   --   --   --   --  0.89 0.79  --   TROPONINI 0.46*  --   --   --   --   --   --   --   --   --    < > = values in this interval not displayed.    Estimated Creatinine Clearance: 104.8 mL/min (by C-G formula based on SCr of 0.79 mg/dL).   Medical History: Past Medical History:  Diagnosis Date  . A-fib ([place])   . Allergy    Seasonal--pollen  . Anticoagulation goal of INR 2 to 3    on coumadin  . CAD (coronary artery disease) 1999   RCA stent  . Carotid stenosis, asymptomatic    moderate RT and mild LT with carotid dopplers 10/26/11  . Colon cancer ([place]) 04/2006   Stage III--T3 N1  . COPD (chronic obstructive pulmonary disease) ([place])   . H/O cardiovascular stress test 01/13/2010   low risk scan, similar to 2008 study  . H/O echocardiogram 05/23/2009   EF >55%, mild MR, Mild TR,   . Hand foot syndrome   . Hemorrhoids   . Hyperlipidemia   . Hypertension   . Neuropathy   . Permanent atrial fibrillation   . Psoriasis   . Tubular adenoma of colon     Medications:  Medications Prior to Admission  Medication Sig  Dispense Refill Last Dose  . Alpha Lipoic Acid 200 MG CAPS Take 600 mg by mouth 2 (two) times daily.    01/21/2018 at Unknown time  . aspirin 81 MG tablet Take 81 mg by mouth daily.   01/20/2018 at Unknown time  . cholecalciferol (VITAMIN D) 1000 UNITS tablet Take 1,000 Units by mouth daily.   01/21/2018 at Unknown time  . Coenzyme Q10 200 MG capsule Take 100 mg by mouth 2 (two) times daily.    01/21/2018 at Unknown time  . Cyanocobalamin (VITAMIN B 12 PO) Take 100 mg by mouth daily.    01/21/2018 at Unknown time  . ELIQUIS 5 MG TABS tablet TAKE 1 TABLET BY MOUTH TWICE DAILY. 180 tablet 0 01/21/2018 at 500 pm  .  Flaxseed, Linseed, (FLAX SEED OIL PO) Take 1,400 mg by mouth daily.   01/21/2018 at Unknown time  . Omega-3 Fatty Acids (FISH OIL) 1200 MG CAPS Take 1,000 mg by mouth daily.    01/21/2018 at Unknown time  . simvastatin (ZOCOR) 80 MG tablet Take 1 tablet (80 mg total) by mouth at bedtime. KEEP OV. 90 tablet 0 01/20/2018 at Unknown time  . vitamin C (ASCORBIC ACID) 500 MG tablet Take 1,000 mg by mouth 2 (two) times daily.   01/21/2018 at Unknown time   Infusions:  . sodium chloride    . heparin 1,400 Units/hr (01/25/18 0919)    Assessment: Patient with hx CAD/PCI 1999 admitted with NSTEMI.  Patient on apixaban prior to admission for hx Afib with last dose noted per med rec 11/5 at 1700.  Pt is s/p cath 11/7 showing multivessel disease.  Now with plans for CABG on Tuesday, 11/12.   Heparin level remains elevated on reduced rate, will reduce further to 1300 units/hr.  Goal of Therapy:  Heparin level goal 0.3-0.7 Monitor platelets by anticoagulation protocol: Yes   Plan:  Decrease heparin to 1300 units/hr Recheck heparin level in 8 hours  Arrie Senate, PharmD, BCPS Clinical Pharmacist [phone] Please check AMION for all [place] [facility] numbers 01/25/2018

## 2018-01-25 NOTE — Progress Notes (Signed)
ANTICOAGULATION CONSULT NOTE Pharmacy Consult for Heparin Indication: chest pain/ACS  No Known Allergies  Patient Measurements: Height: 6\' 3"  (190.5 cm) Weight: 224 lb 9.6 oz (101.9 kg) IBW/kg (Calculated) : 84.5 Heparin Dosing Weight: 92 kg  Vital Signs: Temp: 98.1 F (36.7 C) (11/09 2038) Temp Source: Oral (11/09 2038) BP: 120/59 (11/09 2038) Pulse Rate: 44 (11/09 2038)  Labs: Recent Labs    01/23/18 0403 01/23/18 1438 01/23/18 2220 01/24/18 0209 01/24/18 0744 01/24/18 0829 01/25/18 0349 01/25/18 1314 01/25/18 2216  HGB 14.9  --   --  14.9  --   --  15.8  --   --   HCT 45.2  --   --  45.1  --   --  47.8  --   --   PLT 154  --   --  160  --   --  145*  --   --   APTT 108* 33 49*  --  76*  --   --   --   --   HEPARINUNFRC 0.81*  --  0.38  --  0.65  --  0.78* 0.82* 0.48  CREATININE  --   --   --   --   --  0.89 0.79  --   --     Estimated Creatinine Clearance: 104.8 mL/min (by C-G formula based on SCr of 0.79 mg/dL).  Assessment: 77 y.o. male with CAD awaiting CABG and h/o Afib for heparin  Goal of Therapy:  Heparin level goal 0.3-0.7 Monitor platelets by anticoagulation protocol: Yes   Plan:  Continue Heparin at current rate   Phillis Knack, PharmD, BCPS  01/25/2018

## 2018-01-25 NOTE — Progress Notes (Addendum)
ANTICOAGULATION CONSULT NOTE - Follow Up Consult  Pharmacy Consult for Heparin Indication: chest pain/ACS  No Known Allergies  Patient Measurements: Height: 6\' 3"  (190.5 cm) Weight: 224 lb 9.6 oz (101.9 kg) IBW/kg (Calculated) : 84.5 Heparin Dosing Weight: 92 kg  Vital Signs: Temp: 97.7 F (36.5 C) (11/08 2156) Temp Source: Oral (11/08 2156) BP: 136/77 (11/09 0500) Pulse Rate: 53 (11/09 0500)  Labs: Recent Labs    01/22/18 1236 01/22/18 1854  01/23/18 0403 01/23/18 1438 01/23/18 2220 01/24/18 0209 01/24/18 0744 01/24/18 0829 01/25/18 0349  HGB  --   --    < > 14.9  --   --  14.9  --   --  15.8  HCT  --   --   --  45.2  --   --  45.1  --   --  47.8  PLT  --   --   --  154  --   --  160  --   --  145*  APTT  --  60*  --  108* 33 49*  --  76*  --   --   HEPARINUNFRC  --   --   --  0.81*  --  0.38  --  0.65  --  0.78*  CREATININE  --   --   --   --   --   --   --   --  0.89 0.79  TROPONINI 0.57* 0.46*  --   --   --   --   --   --   --   --    < > = values in this interval not displayed.    Estimated Creatinine Clearance: 104.8 mL/min (by C-G formula based on SCr of 0.79 mg/dL).   Medical History: Past Medical History:  Diagnosis Date  . A-fib ([place])   . Allergy    Seasonal--pollen  . Anticoagulation goal of INR 2 to 3    on coumadin  . CAD (coronary artery disease) 1999   RCA stent  . Carotid stenosis, asymptomatic    moderate RT and mild LT with carotid dopplers 10/26/11  . Colon cancer ([place]) 04/2006   Stage III--T3 N1  . COPD (chronic obstructive pulmonary disease) ([place])   . H/O cardiovascular stress test 01/13/2010   low risk scan, similar to 2008 study  . H/O echocardiogram 05/23/2009   EF >55%, mild MR, Mild TR,   . Hand foot syndrome   . Hemorrhoids   . Hyperlipidemia   . Hypertension   . Neuropathy   . Permanent atrial fibrillation   . Psoriasis   . Tubular adenoma of colon     Medications:  Medications Prior to Admission  Medication Sig  Dispense Refill Last Dose  . Alpha Lipoic Acid 200 MG CAPS Take 600 mg by mouth 2 (two) times daily.    01/21/2018 at Unknown time  . aspirin 81 MG tablet Take 81 mg by mouth daily.   01/20/2018 at Unknown time  . cholecalciferol (VITAMIN D) 1000 UNITS tablet Take 1,000 Units by mouth daily.   01/21/2018 at Unknown time  . Coenzyme Q10 200 MG capsule Take 100 mg by mouth 2 (two) times daily.    01/21/2018 at Unknown time  . Cyanocobalamin (VITAMIN B 12 PO) Take 100 mg by mouth daily.    01/21/2018 at Unknown time  . ELIQUIS 5 MG TABS tablet TAKE 1 TABLET BY MOUTH TWICE DAILY. 180 tablet 0 01/21/2018 at 500 pm  .  Flaxseed, Linseed, (FLAX SEED OIL PO) Take 1,400 mg by mouth daily.   01/21/2018 at Unknown time  . Omega-3 Fatty Acids (FISH OIL) 1200 MG CAPS Take 1,000 mg by mouth daily.    01/21/2018 at Unknown time  . simvastatin (ZOCOR) 80 MG tablet Take 1 tablet (80 mg total) by mouth at bedtime. KEEP OV. 90 tablet 0 01/20/2018 at Unknown time  . vitamin C (ASCORBIC ACID) 500 MG tablet Take 1,000 mg by mouth 2 (two) times daily.   01/21/2018 at Unknown time   Infusions:  . sodium chloride    . heparin 1,500 Units/hr (01/25/18 0100)    Assessment: Patient with hx CAD/PCI 1999 admitted with NSTEMI.  Patient on apixaban prior to admission for hx Afib with last dose noted per med rec 11/5 at 1700.  Pt is s/p cath 11/7 showing multivessel disease.  Now with plans for CABG on Tuesday, 11/12.   Heparin level this morning was SUPRAtherapeutic: 0.78 at 1500 units / hr.  Hgb 15.8, hct 47.8, platelets 145, will plan to decrease heparin by 1 unit / kg / hr.  Spoke with nurse and confirmed no line issues and no signs or symptoms of bleeding.  New rate will be 1400 units / hr. Plans to follow-up with 6 hour heparin level.   Goal of Therapy:  Heparin level goal 0.3-0.7 Monitor platelets by anticoagulation protocol: Yes   Plan:  Decrease heparin to 1400 units / hr Follow-up heparin level @ 1330 Daily heparin  level and CBC CABG next week  [employer], Pharm.D., BCPS Clinical Pharmacist Pager: [phone] Clinical phone for 01/25/2018 from 8:30-4:00 is [phone].  **Pharmacist phone directory can now be found on [email or web address] ([email or web address]).  Listed under [place].  01/25/2018 7:33 AM

## 2018-01-26 DIAGNOSIS — Z955 Presence of coronary angioplasty implant and graft: Secondary | ICD-10-CM

## 2018-01-26 DIAGNOSIS — R001 Bradycardia, unspecified: Secondary | ICD-10-CM

## 2018-01-26 LAB — CBC
HEMATOCRIT: 49 % (ref 39.0–52.0)
HEMOGLOBIN: 16.2 g/dL (ref 13.0–17.0)
MCH: 32.1 pg (ref 26.0–34.0)
MCHC: 33.1 g/dL (ref 30.0–36.0)
MCV: 97.2 fL (ref 80.0–100.0)
NRBC: 0 % (ref 0.0–0.2)
Platelets: 146 10*3/uL — ABNORMAL LOW (ref 150–400)
RBC: 5.04 MIL/uL (ref 4.22–5.81)
RDW: 12.3 % (ref 11.5–15.5)
WBC: 8.6 10*3/uL (ref 4.0–10.5)

## 2018-01-26 LAB — BASIC METABOLIC PANEL
ANION GAP: 9 (ref 5–15)
BUN: 16 mg/dL (ref 8–23)
CO2: 24 mmol/L (ref 22–32)
Calcium: 9.2 mg/dL (ref 8.9–10.3)
Chloride: 104 mmol/L (ref 98–111)
Creatinine, Ser: 0.86 mg/dL (ref 0.61–1.24)
GFR calc non Af Amer: >60 mL/min (ref >60)
GLUCOSE: 101 mg/dL — AB (ref 70–99)
Potassium: 3.9 mmol/L (ref 3.5–5.1)
SODIUM: 137 mmol/L (ref 135–145)

## 2018-01-26 LAB — HEPARIN LEVEL (UNFRACTIONATED): Heparin Unfractionated: 0.55 IU/mL (ref 0.30–0.70)

## 2018-01-26 LAB — TSH: TSH: 4.958 u[IU]/mL — AB (ref 0.350–4.500)

## 2018-01-26 NOTE — Progress Notes (Signed)
Progress Note  Patient Name: [name] Date of Encounter: 01/26/2018  Primary Cardiologist: [name], MD   Subjective   The patient denies any symptoms of chest pain, palpitations, shortness of breath, lightheadedness, dizziness, leg swelling, orthopnea, and PND. Anticipating CABG on Tuesday, 01/28/2018.  Inpatient Medications    Scheduled Meds: . aspirin EC  81 mg Oral Daily  . atorvastatin  80 mg Oral q1800  . cholecalciferol  1,000 Units Oral Daily  . lisinopril  5 mg Oral Daily  . omega-3 acid ethyl esters  1 g Oral Daily  . sodium chloride flush  3 mL Intravenous Q12H  . vitamin B-12  100 mcg Oral Daily   Continuous Infusions: . sodium chloride    . heparin 1,300 Units/hr (01/26/18 0528)   PRN Meds: sodium chloride, sodium chloride flush, temazepam   Vital Signs    Vitals:   01/26/18 0042 01/26/18 0529 01/26/18 0535 01/26/18 0818  BP: 133/61  121/83 115/66  Pulse: (!) 49  (!) 56 (!) 53  Resp: 17 17 17 18   Temp: 98 F (36.7 C)  98 F (36.7 C) 98.1 F (36.7 C)  TempSrc: Oral Oral Oral Oral  SpO2: 99%  98% 98%  Weight:  101.6 kg    Height:        Intake/Output Summary (Last 24 hours) at 01/26/2018 1057 Last data filed at 01/26/2018 0259 Gross per 24 hour  Intake 598.29 ml  Output 1530 ml  Net -931.71 ml   Filed Weights   01/24/18 0500 01/25/18 0500 01/26/18 0529  Weight: 94.9 kg 101.9 kg 101.6 kg    Telemetry    Rate controlled atrial fibrillation with PVCs, at times bradycardic- Personally Reviewed  ECG    No new tracings- Personally Reviewed  Physical Exam   GEN: No acute distress.   Neck: No JVD Cardiac:  Bradycardic, irregular, no murmurs, rubs, or gallops.  Respiratory: Clear to auscultation bilaterally. GI: Soft, nontender, non-distended  MS: No edema; No deformity. Neuro:  Nonfocal  Psych: Normal affect   Labs    Chemistry Recent Labs  Lab 01/21/18 1920  01/24/18 0829 01/25/18 0349 01/26/18 0510  NA 140    < > 140 139 137  K 4.0   < > 4.0 3.8 3.9  CL 105   < > 107 106 104  CO2 24   < > 23 25 24   GLUCOSE 104*   < > 94 97 101*  BUN 21   < > 15 14 16   CREATININE 0.85   < > 0.89 0.79 0.86  CALCIUM 9.2   < > 9.0 9.3 9.2  PROT 6.6  --   --   --   --   ALBUMIN 4.2  --   --   --   --   AST 43*  --   --   --   --   ALT 46*  --   --   --   --   ALKPHOS 60  --   --   --   --   BILITOT 1.1  --   --   --   --   GFRNONAA >60   < > >60 >60 >60  GFRAA >60   < > >60 >60 >60  ANIONGAP 11   < > 10 8 9    < > = values in this interval not displayed.     Hematology Recent Labs  Lab 01/24/18 0209 01/25/18 0349 01/26/18 0510  WBC 7.9  6.7 8.6  RBC 4.59 4.94 5.04  HGB 14.9 15.8 16.2  HCT 45.1 47.8 49.0  MCV 98.3 96.8 97.2  MCH 32.5 32.0 32.1  MCHC 33.0 33.1 33.1  RDW 12.5 12.3 12.3  PLT 160 145* 146*    Cardiac Enzymes Recent Labs  Lab 01/22/18 0357 01/22/18 1236 01/22/18 1854  TROPONINI 0.56* 0.57* 0.46*    Recent Labs  Lab 01/21/18 1851 01/21/18 2206  TROPIPOC 0.06 0.43*     BNPNo results for input(s): BNP, PROBNP in the last 168 hours.   DDimer  Recent Labs  Lab 01/21/18 2231  DDIMER 0.28     Radiology    Vas [place] Lower Extremity Saphenous Vein Mapping  Result Date: 01/24/2018 LOWER EXTREMITY VEIN MAPPING Indications: pre cabg  Performing Technologist: Abram Sander  Examination Guidelines: A complete evaluation includes B-mode imaging, spectral Doppler, color Doppler, and power Doppler as needed of all accessible portions of each vessel. Bilateral testing is considered an integral part of a complete examination. Limited examinations for reoccurring indications may be performed as noted. +---------------+-----------+----------------------+---------------+-----------+   RT Diameter  RT Findings         GSV            LT Diameter  LT Findings      (cm)                                            (cm)                   +---------------+-----------+----------------------+---------------+-----------+      0.56                     Saphenofemoral         0.58                                                   Junction                                  +---------------+-----------+----------------------+---------------+-----------+      0.37                     Proximal thigh         0.52                  +---------------+-----------+----------------------+---------------+-----------+      0.40       branching       Mid thigh            0.41       branching  +---------------+-----------+----------------------+---------------+-----------+      0.25                      Distal thigh          0.34       branching  +---------------+-----------+----------------------+---------------+-----------+      0.22                          Knee              0.31                  +---------------+-----------+----------------------+---------------+-----------+  0.23       branching       Prox calf            0.41       branching  +---------------+-----------+----------------------+---------------+-----------+      0.22                        Mid calf            0.31                  +---------------+-----------+----------------------+---------------+-----------+      0.23                      Distal calf           0.33                  +---------------+-----------+----------------------+---------------+-----------+      0.31                         Ankle              0.30                  +---------------+-----------+----------------------+---------------+-----------+ +----------------+--------------+--------------+----------------+--------------+ RT diameter (cm) RT Findings       SSV      LT Diameter (cm) LT Findings   +----------------+--------------+--------------+----------------+--------------+                 not visualized  Popliteal         0.22                                                        fossa                                    +----------------+--------------+--------------+----------------+--------------+                 not visualizedProximal calf                 not visualized +----------------+--------------+--------------+----------------+--------------+                 not visualized   Mid calf                   not visualized +----------------+--------------+--------------+----------------+--------------+                 not visualized Distal calf                  not visualized +----------------+--------------+--------------+----------------+--------------+ Diagnosing physician: Monica Martinez MD Electronically signed by Monica Martinez MD on 01/24/2018 at 1:58:25 PM.    Final    Vas US Doppler Pre Cabg  Result Date: 01/24/2018 PREOPERATIVE VASCULAR EVALUATION  Indications: Pre cabg. Performing Technologist: Abram Sander  Examination Guidelines: A complete evaluation includes B-mode imaging, spectral Doppler, color Doppler, and power Doppler as needed of all accessible portions of each vessel. Bilateral testing is considered an integral part of a complete examination. Limited examinations for reoccurring indications may be performed as noted.  Right Carotid Findings: +----------+--------+-------+--------+------------+           PSV cm/sEDV cmsDescribeArm Pressure +----------+--------+-------+--------+------------+ Subclavian  180          +----------+--------+-------+--------+------------+  Left Carotid Findings: +----------+--------+--------+--------+------------+ SubclavianPSV cm/sEDV cm/sDescribeArm Pressure +----------+--------+--------+--------+------------+                                   177          +----------+--------+--------+--------+------------+  ABI Findings: +--------+------------------+-----+---------+--------+ Right   Rt Pressure (mmHg)IndexWaveform Comment   +--------+------------------+-----+---------+--------+ Brachial180                    triphasic         +--------+------------------+-----+---------+--------+ PERO                           triphasic         +--------+------------------+-----+---------+--------+ DP                             triphasic         +--------+------------------+-----+---------+--------+ +--------+------------------+-----+---------+-------+ Left    Lt Pressure (mmHg)IndexWaveform Comment +--------+------------------+-----+---------+-------+ [id number]                    triphasic        +--------+------------------+-----+---------+-------+ PERO                           triphasic        +--------+------------------+-----+---------+-------+ DP                             triphasic        +--------+------------------+-----+---------+-------+  Right Doppler Findings: +--------+--------+-----+---------+--------+ Site    PressureIndexDoppler  Comments +--------+--------+-----+---------+--------+ Brachial180          triphasic         +--------+--------+-----+---------+--------+ Radial               triphasic         +--------+--------+-----+---------+--------+ Ulnar                triphasic         +--------+--------+-----+---------+--------+  Left Doppler Findings: +--------+--------+-----+---------+--------+ Site    PressureIndexDoppler  Comments +--------+--------+-----+---------+--------+ [id number]          triphasic         +--------+--------+-----+---------+--------+ Radial               triphasic         +--------+--------+-----+---------+--------+ Ulnar                triphasic         +--------+--------+-----+---------+--------+  Right Upper Extremity: Doppler waveforms decrease 50% with right radial compression. Doppler waveforms decrease 50% with right ulnar compression. Left Upper Extremity: Doppler waveforms remain within normal limits with  left radial compression. Doppler waveforms remain within normal limits with left ulnar compression.  Electronically signed by Monica Martinez MD on 01/24/2018 at 4:59:32 PM.    Final     Cardiac Studies   Left heart catheterization 01/23/18: Conclusions: 1. Severe multivessel coronary artery disease, including calcified 90% stenoses involving the LMCA, LAD, and RCA. 2. Patent stent in the mid RCA; 90% mid RCA stenosis is located just proximal to the stent. 3. Normal left ventricular systolic function and filling pressure.  Recommendations: 1. Inpatient cardiac surgery consultation for CABG. 2. Restart heparin infusion 2 hours after deflation  of TR band. 3. Aggressive secondary prevention.  Recommend to resume apixaban following CABG. Recommend concurrent antiplatelet therapy of aspirin 81mg  daily for at least 12 months.  Patient Profile     77 y.o. male with CAD s/p PCI to the RCA 1999, HTN, permanent AF with slow VR on apixiban (last dose 11/5 PM), colon cancer s/p R colectomy and chemotherapy now in remission who presents with indigestion and elevated biomarkers concerning for NSTEMI.  Assessment & Plan    1. NSTEMI in patient with known CAD s/p PCI to RCA: He presented with chest pain similar to pain felt prior to last intervention to RCA in 1999. Trop peaked at 0.57. EKG with new anterolateral T wave changes. Underwent LHC 01/23/18 which showed severe multivessel CAD including calcified 90% stenosis involving the LMCA, LAD, and RCA. He was recommended for CABG. CT Surgery evaluated and are recommending surgery on Tuesday 11/12. - Continue heparin gtt in anticipation of upcoming surgery - Continue ASA and statin  2. HTN: Blood pressure had been elevated but is now controlled on lisinopril 5 mg daily initiated 01/25/2018.  3. HLD:LDL 71 this admission.  Atorvastatin was increased to 80mg  daily in light of cath results  4. Atrial fibrillation:baseline slow ventricular  response. Apixaban on hold in anticipation of upcoming CABG.  I will check a TSH. - Continue heparin gtt for now with plans to resume apixaban post CABG   For questions or updates, please contact [place] Please consult [email or web address] for contact info under Cardiology/STEMI.      Signed, Kate Sable, MD  01/26/2018, 10:57 AM

## 2018-01-26 NOTE — Progress Notes (Signed)
ANTICOAGULATION CONSULT NOTE Pharmacy Consult for Heparin Indication: chest pain/ACS  No Known Allergies  Patient Measurements: Height: 6\' 3"  (190.5 cm) Weight: 224 lb (101.6 kg) IBW/kg (Calculated) : 84.5 Heparin Dosing Weight: 92 kg  Vital Signs: Temp: 98.1 F (36.7 C) (11/10 0818) Temp Source: Oral (11/10 0818) BP: 115/66 (11/10 0818) Pulse Rate: 53 (11/10 0818)  Labs: Recent Labs    01/23/18 1438  01/23/18 2220  01/24/18 0209 01/24/18 0744 01/24/18 0829 01/25/18 0349 01/25/18 1314 01/25/18 2216 01/26/18 0510  HGB  --   --   --    < > 14.9  --   --  15.8  --   --  16.2  HCT  --   --   --   --  45.1  --   --  47.8  --   --  49.0  PLT  --   --   --   --  160  --   --  145*  --   --  146*  APTT 33  --  49*  --   --  76*  --   --   --   --   --   HEPARINUNFRC  --    < > 0.38  --   --  0.65  --  0.78* 0.82* 0.48 0.55  CREATININE  --   --   --   --   --   --  0.89 0.79  --   --  0.86   < > = values in this interval not displayed.    Estimated Creatinine Clearance: 97.3 mL/min (by C-G formula based on SCr of 0.86 mg/dL).  Assessment: 77 y.o. male with NSTEMI hep - last dose apix 11/5 > hep for CAD. Heparin level this morning therapeutic at 0.55. Hgb and plts ok  Goal of Therapy:  Heparin level goal 0.3-0.7 Monitor platelets by anticoagulation protocol: Yes   Plan:  Continue heparin gtt at 1300 units/hr Monitor daily heparin level, CBC, s/s of bleed 3v CAD plan CABG Tues  Elenor Quinones, PharmD, BCPS Clinical Pharmacist Phone number [phone] 01/26/2018 10:03 AM    01/26/2018

## 2018-01-27 ENCOUNTER — Encounter (HOSPITAL_COMMUNITY): Payer: Self-pay | Admitting: Anesthesiology

## 2018-01-27 DIAGNOSIS — E7800 Pure hypercholesterolemia, unspecified: Secondary | ICD-10-CM

## 2018-01-27 DIAGNOSIS — I4821 Permanent atrial fibrillation: Secondary | ICD-10-CM

## 2018-01-27 LAB — BASIC METABOLIC PANEL
Anion gap: 9 (ref 5–15)
BUN: 18 mg/dL (ref 8–23)
CHLORIDE: 103 mmol/L (ref 98–111)
CO2: 25 mmol/L (ref 22–32)
Calcium: 9.3 mg/dL (ref 8.9–10.3)
Creatinine, Ser: 0.94 mg/dL (ref 0.61–1.24)
GFR calc Af Amer: >60 mL/min (ref >60)
GFR calc non Af Amer: >60 mL/min (ref >60)
Glucose, Bld: 102 mg/dL — ABNORMAL HIGH (ref 70–99)
POTASSIUM: 3.9 mmol/L (ref 3.5–5.1)
SODIUM: 137 mmol/L (ref 135–145)

## 2018-01-27 LAB — CBC
HEMATOCRIT: 49 % (ref 39.0–52.0)
Hemoglobin: 16.1 g/dL (ref 13.0–17.0)
MCH: 31.6 pg (ref 26.0–34.0)
MCHC: 32.9 g/dL (ref 30.0–36.0)
MCV: 96.1 fL (ref 80.0–100.0)
NRBC: 0 % (ref 0.0–0.2)
Platelets: 159 10*3/uL (ref 150–400)
RBC: 5.1 MIL/uL (ref 4.22–5.81)
RDW: 12.4 % (ref 11.5–15.5)
WBC: 6.7 10*3/uL (ref 4.0–10.5)

## 2018-01-27 LAB — HEPARIN LEVEL (UNFRACTIONATED): HEPARIN UNFRACTIONATED: 0.7 [IU]/mL (ref 0.30–0.70)

## 2018-01-27 LAB — SURGICAL PCR SCREEN
MRSA, PCR: NEGATIVE
STAPHYLOCOCCUS AUREUS: NEGATIVE

## 2018-01-27 LAB — TYPE AND SCREEN
ABO/RH(D): B POS
ANTIBODY SCREEN: NEGATIVE

## 2018-01-27 LAB — ABO/RH: ABO/RH(D): B POS

## 2018-01-27 MED ORDER — DOPAMINE-DEXTROSE 3.2-5 MG/ML-% IV SOLN
0.0000 ug/kg/min | INTRAVENOUS | Status: DC
  Filled 2018-01-27: qty 250

## 2018-01-27 MED ORDER — MAGNESIUM SULFATE 50 % IJ SOLN
40.0000 meq | INTRAMUSCULAR | Status: DC
  Filled 2018-01-27: qty 9.85

## 2018-01-27 MED ORDER — TRANEXAMIC ACID (OHS) BOLUS VIA INFUSION
15.0000 mg/kg | INTRAVENOUS | Status: AC
  Administered 2018-01-28: 1518 mg via INTRAVENOUS
  Filled 2018-01-27: qty 1518

## 2018-01-27 MED ORDER — PHENYLEPHRINE HCL-NACL 20-0.9 MG/250ML-% IV SOLN
30.0000 ug/min | INTRAVENOUS | Status: DC
  Filled 2018-01-27 (×2): qty 250

## 2018-01-27 MED ORDER — TRANEXAMIC ACID 1000 MG/10ML IV SOLN
1.5000 mg/kg/h | INTRAVENOUS | Status: AC
  Administered 2018-01-28: 1.5 mg/kg/h via INTRAVENOUS
  Filled 2018-01-27: qty 25

## 2018-01-27 MED ORDER — SODIUM CHLORIDE 0.9 % IV SOLN
750.0000 mg | INTRAVENOUS | Status: DC
  Filled 2018-01-27: qty 750

## 2018-01-27 MED ORDER — BISACODYL 5 MG PO TBEC: 5.0000 mg | DELAYED_RELEASE_TABLET | Freq: Once | ORAL | Status: DC

## 2018-01-27 MED ORDER — DEXMEDETOMIDINE HCL IN NACL 400 MCG/100ML IV SOLN
0.1000 ug/kg/h | INTRAVENOUS | Status: AC
  Administered 2018-01-28: .5 ug/kg/h via INTRAVENOUS
  Filled 2018-01-27: qty 100

## 2018-01-27 MED ORDER — INSULIN REGULAR(HUMAN) IN NACL 100-0.9 UT/100ML-% IV SOLN
INTRAVENOUS | Status: AC
  Administered 2018-01-28: 1.6 [IU]/h via INTRAVENOUS
  Filled 2018-01-27: qty 100

## 2018-01-27 MED ORDER — MILRINONE LACTATE IN DEXTROSE 20-5 MG/100ML-% IV SOLN
0.3000 ug/kg/min | INTRAVENOUS | Status: DC
  Filled 2018-01-27: qty 100

## 2018-01-27 MED ORDER — TRANEXAMIC ACID (OHS) PUMP PRIME SOLUTION
2.0000 mg/kg | INTRAVENOUS | Status: DC
  Filled 2018-01-27: qty 2.02

## 2018-01-27 MED ORDER — INSULIN REGULAR(HUMAN) IN NACL 100-0.9 UT/100ML-% IV SOLN
INTRAVENOUS | Status: DC
  Filled 2018-01-27: qty 100

## 2018-01-27 MED ORDER — CHLORHEXIDINE GLUCONATE CLOTH 2 % EX PADS
6.0000 | MEDICATED_PAD | Freq: Once | CUTANEOUS | Status: AC
  Administered 2018-01-27: 6 via TOPICAL

## 2018-01-27 MED ORDER — SODIUM CHLORIDE 0.9 % IV SOLN
1.5000 g | INTRAVENOUS | Status: AC
  Administered 2018-01-28: 1.5 g via INTRAVENOUS
  Filled 2018-01-27: qty 1.5

## 2018-01-27 MED ORDER — CHLORHEXIDINE GLUCONATE CLOTH 2 % EX PADS
6.0000 | MEDICATED_PAD | Freq: Once | CUTANEOUS | Status: AC
  Administered 2018-01-28: 6 via TOPICAL

## 2018-01-27 MED ORDER — SODIUM CHLORIDE 0.9 % IV SOLN
750.0000 mg | INTRAVENOUS | Status: AC
  Administered 2018-01-28: 750 mg via INTRAVENOUS
  Filled 2018-01-27: qty 750

## 2018-01-27 MED ORDER — VANCOMYCIN HCL 10 G IV SOLR
1500.0000 mg | INTRAVENOUS | Status: AC
  Administered 2018-01-28: 1500 mg via INTRAVENOUS
  Filled 2018-01-27: qty 1500

## 2018-01-27 MED ORDER — SODIUM CHLORIDE 0.9 % IV SOLN
1.5000 g | INTRAVENOUS | Status: DC
  Filled 2018-01-27: qty 1.5

## 2018-01-27 MED ORDER — EPINEPHRINE PF 1 MG/ML IJ SOLN
0.0000 ug/min | INTRAVENOUS | Status: DC
  Filled 2018-01-27: qty 4

## 2018-01-27 MED ORDER — POTASSIUM CHLORIDE 2 MEQ/ML IV SOLN
80.0000 meq | INTRAVENOUS | Status: DC
  Filled 2018-01-27: qty 40

## 2018-01-27 MED ORDER — TEMAZEPAM 15 MG PO CAPS
15.0000 mg | ORAL_CAPSULE | Freq: Once | ORAL | Status: AC | PRN
  Administered 2018-01-27: 15 mg via ORAL
  Filled 2018-01-27: qty 1

## 2018-01-27 MED ORDER — NITROGLYCERIN IN D5W 200-5 MCG/ML-% IV SOLN
2.0000 ug/min | INTRAVENOUS | Status: DC
  Filled 2018-01-27: qty 250

## 2018-01-27 MED ORDER — NOREPINEPHRINE 4 MG/250ML-% IV SOLN
0.0000 ug/min | INTRAVENOUS | Status: DC
  Filled 2018-01-27: qty 250

## 2018-01-27 MED ORDER — PLASMA-LYTE 148 IV SOLN
INTRAVENOUS | Status: AC
  Administered 2018-01-28: 09:00:00
  Filled 2018-01-27: qty 2.5

## 2018-01-27 MED ORDER — METOPROLOL TARTRATE 12.5 MG HALF TABLET
12.5000 mg | ORAL_TABLET | Freq: Once | ORAL | Status: AC
  Administered 2018-01-28: 12.5 mg via ORAL
  Filled 2018-01-27: qty 1

## 2018-01-27 MED ORDER — ALPRAZOLAM 0.25 MG PO TABS: 0.2500 mg | ORAL_TABLET | Freq: Three times a day (TID) | ORAL | Status: DC | PRN

## 2018-01-27 MED ORDER — SODIUM CHLORIDE 0.9 % IV SOLN
INTRAVENOUS | Status: DC
  Filled 2018-01-27: qty 30

## 2018-01-27 MED ORDER — DIAZEPAM 5 MG PO TABS
5.0000 mg | ORAL_TABLET | Freq: Once | ORAL | Status: AC
  Administered 2018-01-28: 5 mg via ORAL
  Filled 2018-01-27: qty 1

## 2018-01-27 MED ORDER — CHLORHEXIDINE GLUCONATE 0.12 % MT SOLN
15.0000 mL | Freq: Once | OROMUCOSAL | Status: AC
  Administered 2018-01-28: 15 mL via OROMUCOSAL
  Filled 2018-01-27: qty 15

## 2018-01-27 NOTE — Anesthesia Preprocedure Evaluation (Addendum)
Anesthesia Evaluation  Patient identified by MRN, date of birth, ID band Patient awake    Reviewed: Allergy & Precautions, NPO status , Patient's Chart, lab work & pertinent test results  Airway Mallampati: II  TM Distance: >3 FB Neck ROM: Full    Dental  (+) Dental Advisory Given   Pulmonary COPD, former smoker,    breath sounds clear to auscultation       Cardiovascular hypertension, Pt. on medications + CAD and + Past MI   Rhythm:Regular Rate:Normal  The paitent was in atrial fibrillation. Normal LV size with mildLV hypertrophy. EF 55-60%. Normal RV size and systolic function. Mild pulmonary hypertension.   Neuro/Psych negative neurological ROS     GI/Hepatic negative GI ROS, Neg liver ROS,   Endo/Other  negative endocrine ROS  Renal/GU negative Renal ROS     Musculoskeletal   Abdominal   Peds  Hematology negative hematology ROS (+)   Anesthesia Other Findings   Reproductive/Obstetrics                            Lab Results  Component Value Date   WBC 6.7 01/27/2018   HGB 16.1 01/27/2018   HCT 49.0 01/27/2018   MCV 96.1 01/27/2018   PLT 159 01/27/2018   Lab Results  Component Value Date   CREATININE 0.94 01/27/2018   BUN 18 01/27/2018   NA 137 01/27/2018   K 3.9 01/27/2018   CL 103 01/27/2018   CO2 25 01/27/2018    Anesthesia Physical Anesthesia Plan  ASA: IV  Anesthesia Plan: General   Post-op Pain Management:    Induction: Intravenous  PONV Risk Score and Plan: 2 and Ondansetron, Treatment may vary due to age or medical condition and Midazolam  Airway Management Planned: Oral ETT  Additional Equipment: Arterial line, CVP, PA Cath, TEE and Ultrasound Guidance Line Placement  Intra-op Plan:   Post-operative Plan: Post-operative intubation/ventilation  Informed Consent: I have reviewed the patients History and Physical, chart, labs and discussed the procedure  including the risks, benefits and alternatives for the proposed anesthesia with the patient or authorized representative who has indicated his/her understanding and acceptance.   Dental advisory given  Plan Discussed with: CRNA  Anesthesia Plan Comments:        Anesthesia Quick Evaluation

## 2018-01-27 NOTE — Plan of Care (Signed)
  Problem: Clinical Measurements: Goal: Cardiovascular complication will be avoided Outcome: Progressing   Problem: Clinical Measurements: Goal: Ability to maintain clinical measurements within normal limits will improve Outcome: Progressing   

## 2018-01-27 NOTE — Progress Notes (Signed)
4 Days Post-Op Procedure(s) (LRB): LEFT HEART CATH AND CORONARY ANGIOGRAPHY (N/A) Subjective: No chest pain or shortness of breath.  Objective: Vital signs in last 24 hours: Temp:  [97.6 F (36.4 C)-97.7 F (36.5 C)] 97.6 F (36.4 C) (11/11 0610) Pulse Rate:  [58] 58 (11/11 0610) Cardiac Rhythm: Atrial fibrillation (11/11 0900) Resp:  [15-18] 18 (11/11 0610) BP: (121-127)/(58-68) 127/68 (11/11 0610) SpO2:  [97 %-98 %] 97 % (11/11 0610) Weight:  [101.2 kg] 101.2 kg (11/11 0610)  Hemodynamic parameters for last 24 hours:    Intake/Output from previous day: 11/10 0701 - 11/11 0700 In: 720 [P.O.:720] Out: 2200 [Urine:2200] Intake/Output this shift: No intake/output data recorded.  General appearance: alert and cooperative Heart: regular rate and rhythm, S1, S2 normal, no murmur, click, rub or gallop Lungs: clear to auscultation bilaterally  Lab Results: Recent Labs    01/26/18 0510 01/27/18 0622  WBC 8.6 6.7  HGB 16.2 16.1  HCT 49.0 49.0  PLT 146* 159   BMET:  Recent Labs    01/26/18 0510 01/27/18 0622  NA 137 137  K 3.9 3.9  CL 104 103  CO2 24 25  GLUCOSE 101* 102*  BUN 16 18  CREATININE 0.86 0.94  CALCIUM 9.2 9.3    PT/INR: No results for input(s): LABPROT, INR in the last 72 hours. ABG    Component Value Date/Time   TCO2 22 05/07/2010 1751   CBG (last 3)  No results for input(s): GLUCAP in the last 72 hours.  Assessment/Plan:  High grade left main and 3-vessel CAD. Plan CABG in am. Patient has no further questions.  LOS: 6 days    Gaye Pollack 01/27/2018

## 2018-01-27 NOTE — Progress Notes (Signed)
Progress Note  Patient Name: [name] Date of Encounter: 01/27/2018  Primary Cardiologist: [name], MD   Subjective   No chest pain no fevers chills nausea vomiting syncope.  Resting comfortably.  He has been mildly anxious.  Inpatient Medications    Scheduled Meds: . aspirin EC  81 mg Oral Daily  . atorvastatin  80 mg Oral q1800  . cholecalciferol  1,000 Units Oral Daily  . [START ON 01/28/2018] heparin-papaverine-plasmalyte irrigation   Irrigation To OR  . [START ON 01/28/2018] insulin   Intravenous To OR  . lisinopril  5 mg Oral Daily  . [START ON 01/28/2018] magnesium sulfate  40 mEq Other To OR  . omega-3 acid ethyl esters  1 g Oral Daily  . [START ON 01/28/2018] phenylephrine  30-200 mcg/min Intravenous To OR  . [START ON 01/28/2018] potassium chloride  80 mEq Other To OR  . sodium chloride flush  3 mL Intravenous Q12H  . [START ON 01/28/2018] tranexamic acid  15 mg/kg Intravenous To OR  . [START ON 01/28/2018] tranexamic acid  2 mg/kg Intracatheter To OR  . vitamin B-12  100 mcg Oral Daily   Continuous Infusions: . sodium chloride    . cefUROXime (ZINACEF)  IV    . [START ON 01/28/2018] cefUROXime (ZINACEF) 1.5 GM IVPB (Compounded or Mini-Bag Plus)    . [START ON 01/28/2018] dexmedetomidine    . [START ON 01/28/2018] DOPamine    . [START ON 01/28/2018] epinephrine    . [START ON 01/28/2018] heparin 30,000 units/NS 1000 mL solution for CELLSAVER    . heparin 1,300 Units/hr (01/27/18 0042)  . [START ON 01/28/2018] milrinone    . [START ON 01/28/2018] nitroGLYCERIN    . [START ON 01/28/2018] norepinephrine (LEVOPHED) Adult infusion    . [START ON 01/28/2018] tranexamic acid (CYKLOKAPRON) infusion ([facility])    . [START ON 01/28/2018] vancomycin     PRN Meds: sodium chloride, sodium chloride flush, temazepam   Vital Signs    Vitals:   01/26/18 0535 01/26/18 0818 01/26/18 2042 01/27/18 0610  BP: 121/83 115/66 (!) 121/58 127/68  Pulse: (!) 56 (!) 53  (!) 58 (!) 58  Resp: 17 18 15 18   Temp: 98 F (36.7 C) 98.1 F (36.7 C) 97.7 F (36.5 C) 97.6 F (36.4 C)  TempSrc: Oral Oral Oral Oral  SpO2: 98% 98% 98% 97%  Weight:    101.2 kg  Height:        Intake/Output Summary (Last 24 hours) at 01/27/2018 0958 Last data filed at 01/26/2018 2305 Gross per 24 hour  Intake 480 ml  Output 1350 ml  Net -870 ml   Filed Weights   01/25/18 0500 01/26/18 0529 01/27/18 0610  Weight: 101.9 kg 101.6 kg 101.2 kg    Telemetry    No adverse arrhythmias.  Heart rate 68 bpm, atrial fibrillation- Personally Reviewed  ECG    Atrial fibrillation- Personally Reviewed  Physical Exam   GEN: No acute distress.   Neck: No JVD Cardiac: Irreg no murmurs, rubs, or gallops.  Respiratory: Clear to auscultation bilaterally. GI: Soft, nontender, non-distended  MS: No edema; No deformity. Neuro:  Nonfocal  Psych: Normal affect   Labs    Chemistry Recent Labs  Lab 01/21/18 1920  01/25/18 0349 01/26/18 0510 01/27/18 0622  NA 140   < > 139 137 137  K 4.0   < > 3.8 3.9 3.9  CL 105   < > 106 104 103  CO2 24   < >  25 24 25   GLUCOSE 104*   < > 97 101* 102*  BUN 21   < > 14 16 18   CREATININE 0.85   < > 0.79 0.86 0.94  CALCIUM 9.2   < > 9.3 9.2 9.3  PROT 6.6  --   --   --   --   ALBUMIN 4.2  --   --   --   --   AST 43*  --   --   --   --   ALT 46*  --   --   --   --   ALKPHOS 60  --   --   --   --   BILITOT 1.1  --   --   --   --   GFRNONAA >60   < > >60 >60 >60  GFRAA >60   < > >60 >60 >60  ANIONGAP 11   < > 8 9 9    < > = values in this interval not displayed.     Hematology Recent Labs  Lab 01/25/18 0349 01/26/18 0510 01/27/18 0622  WBC 6.7 8.6 6.7  RBC 4.94 5.04 5.10  HGB 15.8 16.2 16.1  HCT 47.8 49.0 49.0  MCV 96.8 97.2 96.1  MCH 32.0 32.1 31.6  MCHC 33.1 33.1 32.9  RDW 12.3 12.3 12.4  PLT 145* 146* 159    Cardiac Enzymes Recent Labs  Lab 01/22/18 0357 01/22/18 1236 01/22/18 1854  TROPONINI 0.56* 0.57* 0.46*      Recent Labs  Lab 01/21/18 1851 01/21/18 2206  TROPIPOC 0.06 0.43*     BNPNo results for input(s): BNP, PROBNP in the last 168 hours.   DDimer  Recent Labs  Lab 01/21/18 2231  DDIMER 0.28     Radiology    No results found.  Cardiac Studies   Left heart catheterization 01/23/18: Conclusions: 1. Severe multivessel coronary artery disease, including calcified 90% stenoses involving the LMCA, LAD, and RCA. 2. Patent stent in the mid RCA; 90% mid RCA stenosis is located just proximal to the stent. 3. Normal left ventricular systolic function and filling pressure.  Recommendations: 1. Inpatient cardiac surgery consultation for CABG. 2. Restart heparin infusion 2 hours after deflation of TR band. 3. Aggressive secondary prevention.  Recommend to resume apixaban following CABG. Recommend concurrent antiplatelet therapy of aspirin 81mg  daily for at least 12 months.  Echocardiogram 01/22/2018: - Left ventricle: The cavity size was normal. Wall thickness was   increased in a pattern of mild LVH. Systolic function was normal.   The estimated ejection fraction was in the range of 55% to 60%.   Wall motion was normal; there were no regional wall motion   abnormalities. The study was not technically sufficient to allow   evaluation of LV diastolic dysfunction due to atrial   fibrillation. - Aortic valve: There was no stenosis. - Aorta: Borderline dilated aortic root. Aortic root dimension: 37   mm (ED). - Mitral valve: Mildly calcified annulus. There was trivial   regurgitation. - Left atrium: The atrium was moderately dilated. - Right ventricle: The cavity size was normal. Systolic function   was normal. - Right atrium: The atrium was mildly dilated. - Tricuspid valve: Peak RV-RA gradient (S): 35 mm Hg. - Pulmonary arteries: PA peak pressure: 38 mm Hg (S). - Inferior vena cava: The vessel was normal in size. The   respirophasic diameter changes were in the normal range (>=  50%),   consistent with normal central venous pressure.  Impressions:  - The paitent was in atrial fibrillation. Normal LV size with mild   LV hypertrophy. EF 55-60%. Normal RV size and systolic function.   Mild pulmonary hypertension.  Patient Profile     77 y.o. male with non-ST elevation myocardial infarction with severe CAD permanent atrial fibrillation awaiting CABG  Assessment & Plan    Severe coronary artery disease - Awaiting CABG.  90% calcified left main coronary artery.  Prior PCI to RCA.  Non-ST elevation myocardial infarction -Troponin 0 0.57.  Awaiting revascularization.  Cardiac rehabilitation when able. -Aspirin, statin.  He is off of Eliquis in anticipation for surgery.  Heparin drip.  Essential hypertension - Lisinopril low-dose has been initiated on 01/25/2018.  Hyperlipidemia -LDL 71, atorvastatin was increased to 80 given his cath results.  Permanent atrial fibrillation -Slow ventricular response.  Off of Eliquis.  Resume apixaban post CABG when felt to be safe from a bleeding perspective.  Anxiety -We will give low-dose benzodiazepine to help him during this process.  For questions or updates, please contact [place] Please consult [email or web address] for contact info under        Signed, [name], MD  01/27/2018, 9:58 AM

## 2018-01-27 NOTE — Progress Notes (Signed)
ANTICOAGULATION CONSULT NOTE Pharmacy Consult for Heparin Indication: chest pain/ACS  No Known Allergies  Patient Measurements: Height: 6\' 3"  (190.5 cm) Weight: 223 lb 1.6 oz (101.2 kg) IBW/kg (Calculated) : 84.5 Heparin Dosing Weight: 92 kg  Vital Signs: Temp: 97.6 F (36.4 C) (11/11 0610) Temp Source: Oral (11/11 0610) BP: 127/68 (11/11 0610) Pulse Rate: 58 (11/11 0610)  Labs: Recent Labs    01/25/18 0349  01/25/18 2216 01/26/18 0510 01/27/18 0622  HGB 15.8  --   --  16.2 16.1  HCT 47.8  --   --  49.0 49.0  PLT 145*  --   --  146* 159  HEPARINUNFRC 0.78*   < > 0.48 0.55 0.70  CREATININE 0.79  --   --  0.86 0.94   < > = values in this interval not displayed.    Estimated Creatinine Clearance: 82.4 mL/min (by C-G formula based on SCr of 0.94 mg/dL).  Assessment: 77 y.o. male with NSTEMI hep - last dose apix 11/5 > hep for CAD - plan CABG in am.  Heparin level this morning therapeutic at 0.7 heparin drip rate 1300 uts/hr . Hgb and plts ok  Goal of Therapy:  Heparin level goal 0.3-0.7 Monitor platelets by anticoagulation protocol: Yes   Plan:  Continue heparin gtt at 1300 units/hr Monitor daily heparin level, CBC, s/s of bleed 3v CAD plan CABG Tues F/u post op [facility] plans      Bonnita Nasuti Pharm.D. CPP, BCPS Clinical Pharmacist [phone] 01/27/2018 10:13 AM

## 2018-01-28 ENCOUNTER — Inpatient Hospital Stay (HOSPITAL_COMMUNITY): Payer: Medicare Other

## 2018-01-28 ENCOUNTER — Inpatient Hospital Stay (HOSPITAL_COMMUNITY)
Admission: EM | Disposition: A | Discharge status: Home / Self Care | Payer: Self-pay | Source: Home / Self Care | Attending: Cardiology

## 2018-01-28 ENCOUNTER — Inpatient Hospital Stay (HOSPITAL_COMMUNITY): Payer: Medicare Other | Admitting: Anesthesiology

## 2018-01-28 DIAGNOSIS — I482 Chronic atrial fibrillation, unspecified: Secondary | ICD-10-CM

## 2018-01-28 DIAGNOSIS — Z951 Presence of aortocoronary bypass graft: Secondary | ICD-10-CM

## 2018-01-28 HISTORY — PX: CLIPPING OF ATRIAL APPENDAGE: SHX5773

## 2018-01-28 HISTORY — PX: TEE WITHOUT CARDIOVERSION: SHX5443

## 2018-01-28 HISTORY — PX: CORONARY ARTERY BYPASS GRAFT: SHX141

## 2018-01-28 LAB — GLUCOSE, CAPILLARY
GLUCOSE-CAPILLARY: 144 mg/dL — AB (ref 70–99)
GLUCOSE-CAPILLARY: 113 mg/dL — AB (ref 70–99)
GLUCOSE-CAPILLARY: 118 mg/dL — AB (ref 70–99)
GLUCOSE-CAPILLARY: 119 mg/dL — AB (ref 70–99)
GLUCOSE-CAPILLARY: 129 mg/dL — AB (ref 70–99)
GLUCOSE-CAPILLARY: 122 mg/dL — AB (ref 70–99)
Glucose-Capillary: 137 mg/dL — ABNORMAL HIGH (ref 70–99)
Glucose-Capillary: 114 mg/dL — ABNORMAL HIGH (ref 70–99)
Glucose-Capillary: 116 mg/dL — ABNORMAL HIGH (ref 70–99)
Glucose-Capillary: 85 mg/dL (ref 70–99)

## 2018-01-28 LAB — POCT I-STAT 4, (NA,K, GLUC, HGB,HCT)
GLUCOSE: 147 mg/dL — AB (ref 70–99)
HEMATOCRIT: 40 % (ref 39.0–52.0)
HEMOGLOBIN: 13.6 g/dL (ref 13.0–17.0)
Potassium: 4.2 mmol/L (ref 3.5–5.1)
Sodium: 139 mmol/L (ref 135–145)

## 2018-01-28 LAB — BLOOD GAS, ARTERIAL
Acid-base deficit: 0.9 mmol/L (ref 0.0–2.0)
BICARBONATE: 23 mmol/L (ref 20.0–28.0)
Drawn by: 270271
FIO2: 21
O2 Saturation: 94.8 %
PATIENT TEMPERATURE: 98.6
PO2 ART: 90.4 mmHg (ref 83.0–108.0)
pCO2 arterial: 35.9 mmHg (ref 32.0–48.0)
pH, Arterial: 7.422 (ref 7.350–7.450)

## 2018-01-28 LAB — URINALYSIS, ROUTINE W REFLEX MICROSCOPIC
Bilirubin Urine: NEGATIVE
GLUCOSE, UA: NEGATIVE mg/dL
Hgb urine dipstick: NEGATIVE
Ketones, ur: NEGATIVE mg/dL
Leukocytes, UA: NEGATIVE
Nitrite: NEGATIVE
PH: 6 (ref 5.0–8.0)
PROTEIN: NEGATIVE mg/dL
Specific Gravity, Urine: 1.016 (ref 1.005–1.030)

## 2018-01-28 LAB — PROTIME-INR
INR: 1.04
INR: 1.33
Prothrombin Time: 13.5 seconds (ref 11.4–15.2)
Prothrombin Time: 16.3 seconds — ABNORMAL HIGH (ref 11.4–15.2)

## 2018-01-28 LAB — POCT I-STAT 3, ART BLOOD GAS (G3+)
Acid-base deficit: 2 mmol/L (ref 0.0–2.0)
Acid-base deficit: 2 mmol/L (ref 0.0–2.0)
Acid-base deficit: 2 mmol/L (ref 0.0–2.0)
BICARBONATE: 24.3 mmol/L (ref 20.0–28.0)
Bicarbonate: 22.7 mmol/L (ref 20.0–28.0)
Bicarbonate: 24.1 mmol/L (ref 20.0–28.0)
O2 SAT: 98 %
O2 SAT: 98 %
O2 Saturation: 99 %
PCO2 ART: 44.2 mmHg (ref 32.0–48.0)
PH ART: 7.345 — AB (ref 7.350–7.450)
PH ART: 7.347 — AB (ref 7.350–7.450)
PO2 ART: 103 mmHg (ref 83.0–108.0)
PO2 ART: 144 mmHg — AB (ref 83.0–108.0)
Patient temperature: 35.7
Patient temperature: 36.9
TCO2: 24 mmol/L (ref 22–32)
TCO2: 25 mmol/L (ref 22–32)
TCO2: 26 mmol/L (ref 22–32)
pCO2 arterial: 37.1 mmHg (ref 32.0–48.0)
pCO2 arterial: 44.1 mmHg (ref 32.0–48.0)
pH, Arterial: 7.388 (ref 7.350–7.450)
pO2, Arterial: 106 mmHg (ref 83.0–108.0)

## 2018-01-28 LAB — BASIC METABOLIC PANEL
Anion gap: 9 (ref 5–15)
BUN: 21 mg/dL (ref 8–23)
CALCIUM: 9.5 mg/dL (ref 8.9–10.3)
CO2: 24 mmol/L (ref 22–32)
CREATININE: 1.01 mg/dL (ref 0.61–1.24)
Chloride: 103 mmol/L (ref 98–111)
GFR calc non Af Amer: >60 mL/min (ref >60)
Glucose, Bld: 96 mg/dL (ref 70–99)
Potassium: 4.1 mmol/L (ref 3.5–5.1)
SODIUM: 136 mmol/L (ref 135–145)

## 2018-01-28 LAB — CBC
HCT: 48.2 % (ref 39.0–52.0)
HCT: 41.6 % (ref 39.0–52.0)
HEMATOCRIT: 41.7 % (ref 39.0–52.0)
HEMOGLOBIN: 15.4 g/dL (ref 13.0–17.0)
HEMOGLOBIN: 14.3 g/dL (ref 13.0–17.0)
HEMOGLOBIN: 13.6 g/dL (ref 13.0–17.0)
MCH: 31.2 pg (ref 26.0–34.0)
MCH: 32.9 pg (ref 26.0–34.0)
MCH: 31.6 pg (ref 26.0–34.0)
MCHC: 32 g/dL (ref 30.0–36.0)
MCHC: 34.3 g/dL (ref 30.0–36.0)
MCHC: 32.7 g/dL (ref 30.0–36.0)
MCV: 97.6 fL (ref 80.0–100.0)
MCV: 95.9 fL (ref 80.0–100.0)
MCV: 96.7 fL (ref 80.0–100.0)
NRBC: 0 % (ref 0.0–0.2)
PLATELETS: 170 10*3/uL (ref 150–400)
Platelets: UNDETERMINED 10*3/uL (ref 150–400)
Platelets: 110 10*3/uL — ABNORMAL LOW (ref 150–400)
RBC: 4.94 MIL/uL (ref 4.22–5.81)
RBC: 4.35 MIL/uL (ref 4.22–5.81)
RBC: 4.3 MIL/uL (ref 4.22–5.81)
RDW: 12.3 % (ref 11.5–15.5)
RDW: 12.3 % (ref 11.5–15.5)
RDW: 12.3 % (ref 11.5–15.5)
WBC: 8 10*3/uL (ref 4.0–10.5)
WBC: 11.5 10*3/uL — AB (ref 4.0–10.5)
WBC: 11.2 10*3/uL — AB (ref 4.0–10.5)
nRBC: 0 % (ref 0.0–0.2)
nRBC: 0 % (ref 0.0–0.2)

## 2018-01-28 LAB — POCT I-STAT, CHEM 8
BUN: 19 mg/dL (ref 8–23)
CREATININE: 0.7 mg/dL (ref 0.61–1.24)
Calcium, Ion: 1.14 mmol/L — ABNORMAL LOW (ref 1.15–1.40)
Chloride: 104 mmol/L (ref 98–111)
Glucose, Bld: 126 mg/dL — ABNORMAL HIGH (ref 70–99)
HCT: 39 % (ref 39.0–52.0)
HEMOGLOBIN: 13.3 g/dL (ref 13.0–17.0)
POTASSIUM: 4.7 mmol/L (ref 3.5–5.1)
Sodium: 138 mmol/L (ref 135–145)
TCO2: 27 mmol/L (ref 22–32)

## 2018-01-28 LAB — APTT: APTT: 28 s (ref 24–36)

## 2018-01-28 LAB — CREATININE, SERUM
Creatinine, Ser: 0.69 mg/dL (ref 0.61–1.24)
GFR calc Af Amer: >60 mL/min (ref >60)

## 2018-01-28 LAB — PLATELET COUNT: PLATELETS: UNDETERMINED 10*3/uL (ref 150–400)

## 2018-01-28 LAB — HEMOGLOBIN AND HEMATOCRIT, BLOOD
HCT: 36.6 % — ABNORMAL LOW (ref 39.0–52.0)
HEMOGLOBIN: 12.3 g/dL — AB (ref 13.0–17.0)

## 2018-01-28 LAB — MAGNESIUM: Magnesium: 2.9 mg/dL — ABNORMAL HIGH (ref 1.7–2.4)

## 2018-01-28 LAB — HEPARIN LEVEL (UNFRACTIONATED): HEPARIN UNFRACTIONATED: 0.59 [IU]/mL (ref 0.30–0.70)

## 2018-01-28 SURGERY — CORONARY ARTERY BYPASS GRAFTING (CABG)
Anesthesia: General | Site: Chest

## 2018-01-28 MED ORDER — EPHEDRINE SULFATE-NACL 50-0.9 MG/10ML-% IV SOSY
PREFILLED_SYRINGE | INTRAVENOUS | Status: DC | PRN
  Administered 2018-01-28: 5 mg via INTRAVENOUS
  Administered 2018-01-28 (×2): 10 mg via INTRAVENOUS
  Administered 2018-01-28: 5 mg via INTRAVENOUS
  Administered 2018-01-28: 10 mg via INTRAVENOUS

## 2018-01-28 MED ORDER — FENTANYL CITRATE (PF) 250 MCG/5ML IJ SOLN
INTRAMUSCULAR | Status: AC
  Filled 2018-01-28: qty 25

## 2018-01-28 MED ORDER — LIDOCAINE 2% (20 MG/ML) 5 ML SYRINGE
INTRAMUSCULAR | Status: AC
  Filled 2018-01-28: qty 5

## 2018-01-28 MED ORDER — NITROGLYCERIN 0.2 MG/ML ON CALL CATH LAB
INTRAVENOUS | Status: DC | PRN
  Administered 2018-01-28: 40 ug via INTRAVENOUS

## 2018-01-28 MED ORDER — DEXAMETHASONE SODIUM PHOSPHATE 10 MG/ML IJ SOLN
INTRAMUSCULAR | Status: AC
  Filled 2018-01-28: qty 1

## 2018-01-28 MED ORDER — LACTATED RINGERS IV SOLN
INTRAVENOUS | Status: DC
  Administered 2018-01-28: 13:00:00 via INTRAVENOUS

## 2018-01-28 MED ORDER — OXYCODONE HCL 5 MG PO TABS
5.0000 mg | ORAL_TABLET | ORAL | Status: DC | PRN
  Administered 2018-01-29 (×4): 10 mg via ORAL
  Filled 2018-01-28 (×4): qty 2

## 2018-01-28 MED ORDER — CALCIUM CHLORIDE 10 % IV SOLN
INTRAVENOUS | Status: AC
  Filled 2018-01-28: qty 10

## 2018-01-28 MED ORDER — ONDANSETRON HCL 4 MG/2ML IJ SOLN
INTRAMUSCULAR | Status: AC
  Filled 2018-01-28: qty 2

## 2018-01-28 MED ORDER — ORAL CARE MOUTH RINSE
15.0000 mL | OROMUCOSAL | Status: DC
  Administered 2018-01-28 (×2): 15 mL via OROMUCOSAL

## 2018-01-28 MED ORDER — PROPOFOL 10 MG/ML IV BOLUS
INTRAVENOUS | Status: DC | PRN
  Administered 2018-01-28: 50 mg via INTRAVENOUS

## 2018-01-28 MED ORDER — MIDAZOLAM HCL 2 MG/2ML IJ SOLN
2.0000 mg | INTRAMUSCULAR | Status: DC | PRN
  Administered 2018-01-28: 2 mg via INTRAVENOUS
  Filled 2018-01-28: qty 2

## 2018-01-28 MED ORDER — DEXMEDETOMIDINE HCL IN NACL 200 MCG/50ML IV SOLN
0.0000 ug/kg/h | INTRAVENOUS | Status: DC
  Administered 2018-01-28: 0.3 ug/kg/h via INTRAVENOUS
  Filled 2018-01-28: qty 50

## 2018-01-28 MED ORDER — ASPIRIN EC 325 MG PO TBEC
325.0000 mg | DELAYED_RELEASE_TABLET | Freq: Every day | ORAL | Status: DC
  Administered 2018-01-29: 325 mg via ORAL
  Filled 2018-01-28: qty 1

## 2018-01-28 MED ORDER — ALBUMIN HUMAN 5 % IV SOLN
250.0000 mL | INTRAVENOUS | Status: AC | PRN
  Administered 2018-01-28 (×4): 12.5 g via INTRAVENOUS
  Filled 2018-01-28 (×2): qty 250

## 2018-01-28 MED ORDER — ACETAMINOPHEN 160 MG/5ML PO SOLN: 1000.0000 mg | Freq: Four times a day (QID) | ORAL | Status: DC

## 2018-01-28 MED ORDER — VECURONIUM BROMIDE 10 MG IV SOLR
INTRAVENOUS | Status: AC
  Filled 2018-01-28: qty 10

## 2018-01-28 MED ORDER — PROPOFOL 10 MG/ML IV BOLUS
INTRAVENOUS | Status: AC
  Filled 2018-01-28: qty 20

## 2018-01-28 MED ORDER — MAGNESIUM SULFATE 4 GM/100ML IV SOLN
4.0000 g | Freq: Once | INTRAVENOUS | Status: AC
  Administered 2018-01-28: 4 g via INTRAVENOUS
  Filled 2018-01-28: qty 100

## 2018-01-28 MED ORDER — METOPROLOL TARTRATE 12.5 MG HALF TABLET: 12.5000 mg | ORAL_TABLET | Freq: Two times a day (BID) | ORAL | Status: DC

## 2018-01-28 MED ORDER — HEPARIN SODIUM (PORCINE) 1000 UNIT/ML IJ SOLN
INTRAMUSCULAR | Status: AC
  Filled 2018-01-28: qty 1

## 2018-01-28 MED ORDER — METOPROLOL TARTRATE 25 MG/10 ML ORAL SUSPENSION: 12.5000 mg | Freq: Two times a day (BID) | ORAL | Status: DC

## 2018-01-28 MED ORDER — ACETAMINOPHEN 500 MG PO TABS
1000.0000 mg | ORAL_TABLET | Freq: Four times a day (QID) | ORAL | Status: DC
  Administered 2018-01-28 – 2018-01-30 (×6): 1000 mg via ORAL
  Filled 2018-01-28 (×6): qty 2

## 2018-01-28 MED ORDER — LACTATED RINGERS IV SOLN
500.0000 mL | Freq: Once | INTRAVENOUS | Status: AC | PRN
  Administered 2018-01-28: 500 mL via INTRAVENOUS

## 2018-01-28 MED ORDER — SODIUM CHLORIDE 0.9 % IV SOLN
INTRAVENOUS | Status: DC | PRN
  Administered 2018-01-28: 20 ug/min via INTRAVENOUS

## 2018-01-28 MED ORDER — PROTAMINE SULFATE 10 MG/ML IV SOLN
INTRAVENOUS | Status: DC | PRN
  Administered 2018-01-28 (×4): 50 mg via INTRAVENOUS
  Administered 2018-01-28: 100 mg via INTRAVENOUS

## 2018-01-28 MED ORDER — VANCOMYCIN HCL IN DEXTROSE 1-5 GM/200ML-% IV SOLN
1000.0000 mg | Freq: Once | INTRAVENOUS | Status: AC
  Administered 2018-01-28: 1000 mg via INTRAVENOUS
  Filled 2018-01-28: qty 200

## 2018-01-28 MED ORDER — SODIUM CHLORIDE 0.9% FLUSH: 10.0000 mL | INTRAVENOUS | Status: DC | PRN

## 2018-01-28 MED ORDER — DOCUSATE SODIUM 100 MG PO CAPS
200.0000 mg | ORAL_CAPSULE | Freq: Every day | ORAL | Status: DC
  Filled 2018-01-28: qty 2

## 2018-01-28 MED ORDER — THROMBIN 20000 UNITS EX SOLR
CUTANEOUS | Status: DC | PRN
  Administered 2018-01-28: 20000 [IU] via TOPICAL

## 2018-01-28 MED ORDER — ALPRAZOLAM 0.25 MG PO TABS: 0.2500 mg | ORAL_TABLET | Freq: Three times a day (TID) | ORAL | Status: DC | PRN

## 2018-01-28 MED ORDER — ORAL CARE MOUTH RINSE
15.0000 mL | Freq: Two times a day (BID) | OROMUCOSAL | Status: DC
  Administered 2018-01-28 – 2018-02-02 (×5): 15 mL via OROMUCOSAL

## 2018-01-28 MED ORDER — GLYCOPYRROLATE PF 0.2 MG/ML IJ SOSY
PREFILLED_SYRINGE | INTRAMUSCULAR | Status: AC
  Filled 2018-01-28: qty 1

## 2018-01-28 MED ORDER — SODIUM CHLORIDE 0.9% FLUSH
10.0000 mL | Freq: Two times a day (BID) | INTRAVENOUS | Status: DC
  Administered 2018-01-29 (×2): 10 mL

## 2018-01-28 MED ORDER — INSULIN REGULAR BOLUS VIA INFUSION
0.0000 [IU] | Freq: Three times a day (TID) | INTRAVENOUS | Status: DC
  Filled 2018-01-28: qty 10

## 2018-01-28 MED ORDER — ASPIRIN 81 MG PO CHEW: 324.0000 mg | CHEWABLE_TABLET | Freq: Every day | ORAL | Status: DC

## 2018-01-28 MED ORDER — TRAMADOL HCL 50 MG PO TABS: 50.0000 mg | ORAL_TABLET | ORAL | Status: DC | PRN

## 2018-01-28 MED ORDER — MORPHINE SULFATE (PF) 2 MG/ML IV SOLN
1.0000 mg | INTRAVENOUS | Status: AC | PRN
  Administered 2018-01-28 (×2): 2 mg via INTRAVENOUS

## 2018-01-28 MED ORDER — FAMOTIDINE IN NACL 20-0.9 MG/50ML-% IV SOLN
20.0000 mg | Freq: Two times a day (BID) | INTRAVENOUS | Status: DC
  Administered 2018-01-28: 20 mg via INTRAVENOUS

## 2018-01-28 MED ORDER — MORPHINE SULFATE (PF) 2 MG/ML IV SOLN
2.0000 mg | INTRAVENOUS | Status: DC | PRN
  Administered 2018-01-29 (×3): 2 mg via INTRAVENOUS
  Filled 2018-01-28 (×5): qty 1

## 2018-01-28 MED ORDER — THROMBIN 5000 UNITS EX SOLR
CUTANEOUS | Status: AC
  Filled 2018-01-28: qty 5000

## 2018-01-28 MED ORDER — MIDAZOLAM HCL (PF) 10 MG/2ML IJ SOLN
INTRAMUSCULAR | Status: AC
  Filled 2018-01-28: qty 2

## 2018-01-28 MED ORDER — ALBUMIN HUMAN 5 % IV SOLN
INTRAVENOUS | Status: DC | PRN
  Administered 2018-01-28: 12:00:00 via INTRAVENOUS

## 2018-01-28 MED ORDER — ONDANSETRON HCL 4 MG/2ML IJ SOLN
INTRAMUSCULAR | Status: DC | PRN
  Administered 2018-01-28: 4 mg via INTRAVENOUS

## 2018-01-28 MED ORDER — PROTAMINE SULFATE 10 MG/ML IV SOLN
INTRAVENOUS | Status: AC
  Filled 2018-01-28: qty 25

## 2018-01-28 MED ORDER — CHLORHEXIDINE GLUCONATE 0.12% ORAL RINSE (MEDLINE KIT)
15.0000 mL | Freq: Two times a day (BID) | OROMUCOSAL | Status: DC
  Administered 2018-01-28: 15 mL via OROMUCOSAL

## 2018-01-28 MED ORDER — METOPROLOL TARTRATE 5 MG/5ML IV SOLN: 2.5000 mg | INTRAVENOUS | Status: DC | PRN

## 2018-01-28 MED ORDER — PANTOPRAZOLE SODIUM 40 MG PO TBEC: 40.0000 mg | DELAYED_RELEASE_TABLET | Freq: Every day | ORAL | Status: DC

## 2018-01-28 MED ORDER — CHLORHEXIDINE GLUCONATE CLOTH 2 % EX PADS
6.0000 | MEDICATED_PAD | Freq: Every day | CUTANEOUS | Status: DC
  Administered 2018-01-28 – 2018-01-29 (×2): 6 via TOPICAL

## 2018-01-28 MED ORDER — PHENYLEPHRINE 40 MCG/ML (10ML) SYRINGE FOR IV PUSH (FOR BLOOD PRESSURE SUPPORT)
PREFILLED_SYRINGE | INTRAVENOUS | Status: AC
  Filled 2018-01-28: qty 10

## 2018-01-28 MED ORDER — CHLORHEXIDINE GLUCONATE 0.12 % MT SOLN
15.0000 mL | OROMUCOSAL | Status: AC
  Administered 2018-01-28: 15 mL via OROMUCOSAL

## 2018-01-28 MED ORDER — PROTAMINE SULFATE 10 MG/ML IV SOLN
INTRAVENOUS | Status: AC
  Filled 2018-01-28: qty 10

## 2018-01-28 MED ORDER — ROCURONIUM BROMIDE 50 MG/5ML IV SOSY
PREFILLED_SYRINGE | INTRAVENOUS | Status: AC
  Filled 2018-01-28: qty 5

## 2018-01-28 MED ORDER — BISACODYL 10 MG RE SUPP: 10.0000 mg | Freq: Every day | RECTAL | Status: DC

## 2018-01-28 MED ORDER — ROCURONIUM BROMIDE 50 MG/5ML IV SOSY
PREFILLED_SYRINGE | INTRAVENOUS | Status: DC | PRN
  Administered 2018-01-28: 50 mg via INTRAVENOUS

## 2018-01-28 MED ORDER — SODIUM CHLORIDE 0.9 % IV SOLN
INTRAVENOUS | Status: DC
  Administered 2018-01-28: 13:00:00 via INTRAVENOUS

## 2018-01-28 MED ORDER — MIDAZOLAM HCL 5 MG/5ML IJ SOLN
INTRAMUSCULAR | Status: DC | PRN
  Administered 2018-01-28 (×2): 3 mg via INTRAVENOUS
  Administered 2018-01-28 (×2): 1 mg via INTRAVENOUS
  Administered 2018-01-28: 2 mg via INTRAVENOUS

## 2018-01-28 MED ORDER — ATORVASTATIN CALCIUM 80 MG PO TABS
80.0000 mg | ORAL_TABLET | Freq: Every day | ORAL | Status: DC
  Administered 2018-01-29 – 2018-02-02 (×5): 80 mg via ORAL
  Filled 2018-01-28 (×5): qty 1

## 2018-01-28 MED ORDER — ACETAMINOPHEN 160 MG/5ML PO SOLN: 650.0000 mg | Freq: Once | ORAL | Status: AC

## 2018-01-28 MED ORDER — SODIUM CHLORIDE 0.45 % IV SOLN: INTRAVENOUS | Status: DC | PRN

## 2018-01-28 MED ORDER — FENTANYL CITRATE (PF) 250 MCG/5ML IJ SOLN
INTRAMUSCULAR | Status: DC | PRN
  Administered 2018-01-28: 50 ug via INTRAVENOUS
  Administered 2018-01-28: 150 ug via INTRAVENOUS
  Administered 2018-01-28: 100 ug via INTRAVENOUS
  Administered 2018-01-28 (×2): 50 ug via INTRAVENOUS
  Administered 2018-01-28: 250 ug via INTRAVENOUS
  Administered 2018-01-28: 100 ug via INTRAVENOUS
  Administered 2018-01-28: 250 ug via INTRAVENOUS
  Administered 2018-01-28: 150 ug via INTRAVENOUS
  Administered 2018-01-28: 100 ug via INTRAVENOUS

## 2018-01-28 MED ORDER — VECURONIUM BROMIDE 10 MG IV SOLR
INTRAVENOUS | Status: DC | PRN
  Administered 2018-01-28 (×4): 5 mg via INTRAVENOUS

## 2018-01-28 MED ORDER — LACTATED RINGERS IV SOLN
INTRAVENOUS | Status: DC | PRN
  Administered 2018-01-28: 06:00:00 via INTRAVENOUS

## 2018-01-28 MED ORDER — PHENYLEPHRINE 40 MCG/ML (10ML) SYRINGE FOR IV PUSH (FOR BLOOD PRESSURE SUPPORT)
PREFILLED_SYRINGE | INTRAVENOUS | Status: DC | PRN
  Administered 2018-01-28 (×4): 80 ug via INTRAVENOUS

## 2018-01-28 MED ORDER — BISACODYL 5 MG PO TBEC
10.0000 mg | DELAYED_RELEASE_TABLET | Freq: Every day | ORAL | Status: DC
  Filled 2018-01-28: qty 2

## 2018-01-28 MED ORDER — 0.9 % SODIUM CHLORIDE (POUR BTL) OPTIME
TOPICAL | Status: DC | PRN
  Administered 2018-01-28: 5000 mL

## 2018-01-28 MED ORDER — SODIUM CHLORIDE 0.9% FLUSH: 3.0000 mL | Freq: Two times a day (BID) | INTRAVENOUS | Status: DC

## 2018-01-28 MED ORDER — NITROGLYCERIN IN D5W 200-5 MCG/ML-% IV SOLN: 0.0000 ug/min | INTRAVENOUS | Status: DC

## 2018-01-28 MED ORDER — INSULIN REGULAR(HUMAN) IN NACL 100-0.9 UT/100ML-% IV SOLN
INTRAVENOUS | Status: DC
  Administered 2018-01-28: 2.6 [IU]/h via INTRAVENOUS

## 2018-01-28 MED ORDER — THROMBIN 20000 UNITS EX SOLR
OROMUCOSAL | Status: DC | PRN
  Administered 2018-01-28: 09:00:00 via TOPICAL

## 2018-01-28 MED ORDER — EPHEDRINE 5 MG/ML INJ
INTRAVENOUS | Status: AC
  Filled 2018-01-28: qty 20

## 2018-01-28 MED ORDER — ONDANSETRON HCL 4 MG/2ML IJ SOLN: 4.0000 mg | Freq: Four times a day (QID) | INTRAMUSCULAR | Status: DC | PRN

## 2018-01-28 MED ORDER — LACTATED RINGERS IV SOLN: INTRAVENOUS | Status: DC

## 2018-01-28 MED ORDER — THROMBIN 5000 UNITS EX SOLR
CUTANEOUS | Status: DC | PRN
  Administered 2018-01-28: 5000 [IU] via TOPICAL

## 2018-01-28 MED ORDER — POTASSIUM CHLORIDE 10 MEQ/50ML IV SOLN: 10.0000 meq | INTRAVENOUS | Status: AC

## 2018-01-28 MED ORDER — SODIUM CHLORIDE 0.9 % IV SOLN
250.0000 mL | INTRAVENOUS | Status: DC
  Administered 2018-01-29 – 2018-01-30 (×2): 250 mL via INTRAVENOUS

## 2018-01-28 MED ORDER — ETOMIDATE 2 MG/ML IV SOLN
INTRAVENOUS | Status: AC
  Filled 2018-01-28: qty 10

## 2018-01-28 MED ORDER — THROMBIN (RECOMBINANT) 20000 UNITS EX SOLR
CUTANEOUS | Status: AC
  Filled 2018-01-28: qty 20000

## 2018-01-28 MED ORDER — SODIUM CHLORIDE 0.9% FLUSH: 3.0000 mL | INTRAVENOUS | Status: DC | PRN

## 2018-01-28 MED ORDER — ACETAMINOPHEN 650 MG RE SUPP
650.0000 mg | Freq: Once | RECTAL | Status: AC
  Administered 2018-01-28: 650 mg via RECTAL

## 2018-01-28 MED ORDER — GLYCOPYRROLATE PF 0.2 MG/ML IJ SOSY
PREFILLED_SYRINGE | INTRAMUSCULAR | Status: DC | PRN
  Administered 2018-01-28: .1 mg via INTRAVENOUS

## 2018-01-28 MED ORDER — SODIUM CHLORIDE 0.9 % IV SOLN
1.5000 g | Freq: Two times a day (BID) | INTRAVENOUS | Status: DC
  Administered 2018-01-28 – 2018-01-30 (×4): 1.5 g via INTRAVENOUS
  Filled 2018-01-28 (×4): qty 1.5

## 2018-01-28 MED ORDER — PHENYLEPHRINE HCL-NACL 20-0.9 MG/250ML-% IV SOLN
0.0000 ug/min | INTRAVENOUS | Status: DC
  Administered 2018-01-28: 5 ug/min via INTRAVENOUS

## 2018-01-28 MED ORDER — HEPARIN SODIUM (PORCINE) 1000 UNIT/ML IJ SOLN
INTRAMUSCULAR | Status: DC | PRN
  Administered 2018-01-28: 35000 [IU] via INTRAVENOUS

## 2018-01-28 MED ORDER — DEXAMETHASONE SODIUM PHOSPHATE 10 MG/ML IJ SOLN
INTRAMUSCULAR | Status: DC | PRN
  Administered 2018-01-28: 10 mg via INTRAVENOUS

## 2018-01-28 MED ORDER — LACTATED RINGERS IV SOLN
INTRAVENOUS | Status: DC | PRN
  Administered 2018-01-28 (×2): via INTRAVENOUS

## 2018-01-28 SURGICAL SUPPLY — 115 items
ARTICLIP LAA PROCLIP II 45 (Clip) ×4 IMPLANT
ARTICLIP LAA PROCLIP II 45MM (Clip) ×1 IMPLANT
BAG DECANTER FOR FLEXI CONT (MISCELLANEOUS) ×5 IMPLANT
BANDAGE ACE 4X5 VEL STRL LF (GAUZE/BANDAGES/DRESSINGS) ×5 IMPLANT
BANDAGE ACE 6X5 VEL STRL LF (GAUZE/BANDAGES/DRESSINGS) ×5 IMPLANT
BANDAGE ELASTIC 4 VELCRO ST LF (GAUZE/BANDAGES/DRESSINGS) ×5 IMPLANT
BANDAGE ELASTIC 6 VELCRO ST LF (GAUZE/BANDAGES/DRESSINGS) ×5 IMPLANT
BASKET HEART  (ORDER IN 25'S) (MISCELLANEOUS) ×1
BASKET HEART (ORDER IN 25'S) (MISCELLANEOUS) ×1
BASKET HEART (ORDER IN 25S) (MISCELLANEOUS) ×3 IMPLANT
BLADE STERNUM SYSTEM 6 (BLADE) ×5 IMPLANT
BLADE SURG 11 STRL SS (BLADE) ×5 IMPLANT
BNDG GAUZE ELAST 4 BULKY (GAUZE/BANDAGES/DRESSINGS) ×5 IMPLANT
CANISTER SUCT 3000ML PPV (MISCELLANEOUS) ×5 IMPLANT
CANNULA ARTERIAL NVNT 3/8 22FR (MISCELLANEOUS) ×5 IMPLANT
CATH ROBINSON RED A/P 18FR (CATHETERS) ×10 IMPLANT
CATH THORACIC 28FR (CATHETERS) ×5 IMPLANT
CATH THORACIC 36FR (CATHETERS) ×5 IMPLANT
CATH THORACIC 36FR RT ANG (CATHETERS) ×5 IMPLANT
CLIP VESOCCLUDE MED 24/CT (CLIP) IMPLANT
CLIP VESOCCLUDE SM WIDE 24/CT (CLIP) ×5 IMPLANT
COVER WAND RF STERILE (DRAPES) ×5 IMPLANT
CRADLE DONUT ADULT HEAD (MISCELLANEOUS) ×5 IMPLANT
DEVICE ATRICLIP LAA PRCLPII 45 (Clip) ×3 IMPLANT
DRAPE CARDIOVASCULAR INCISE (DRAPES) ×2
DRAPE SLUSH/WARMER DISC (DRAPES) ×5 IMPLANT
DRAPE SRG 135X102X78XABS (DRAPES) ×3 IMPLANT
DRSG AQUACEL AG ADV 3.5X14 (GAUZE/BANDAGES/DRESSINGS) ×5 IMPLANT
DRSG COVADERM 4X14 (GAUZE/BANDAGES/DRESSINGS) ×5 IMPLANT
ELECT CAUTERY BLADE 6.4 (BLADE) ×5 IMPLANT
ELECT REM PT RETURN 9FT ADLT (ELECTROSURGICAL) ×10
ELECTRODE REM PT RTRN 9FT ADLT (ELECTROSURGICAL) ×6 IMPLANT
FELT TEFLON 1X6 (MISCELLANEOUS) ×10 IMPLANT
GAUZE SPONGE 4X4 12PLY STRL (GAUZE/BANDAGES/DRESSINGS) ×10 IMPLANT
GAUZE SPONGE 4X4 12PLY STRL LF (GAUZE/BANDAGES/DRESSINGS) ×10 IMPLANT
GLOVE BIO SURGEON STRL SZ 6 (GLOVE) ×10 IMPLANT
GLOVE BIO SURGEON STRL SZ 6.5 (GLOVE) IMPLANT
GLOVE BIO SURGEON STRL SZ7 (GLOVE) IMPLANT
GLOVE BIO SURGEON STRL SZ7.5 (GLOVE) IMPLANT
GLOVE BIO SURGEONS STRL SZ 6.5 (GLOVE)
GLOVE BIOGEL M 6.5 STRL (GLOVE) ×10 IMPLANT
GLOVE BIOGEL PI IND STRL 6 (GLOVE) ×9 IMPLANT
GLOVE BIOGEL PI IND STRL 6.5 (GLOVE) IMPLANT
GLOVE BIOGEL PI IND STRL 7.0 (GLOVE) ×3 IMPLANT
GLOVE BIOGEL PI IND STRL 7.5 (GLOVE) ×3 IMPLANT
GLOVE BIOGEL PI INDICATOR 6 (GLOVE) ×6
GLOVE BIOGEL PI INDICATOR 6.5 (GLOVE)
GLOVE BIOGEL PI INDICATOR 7.0 (GLOVE) ×2
GLOVE BIOGEL PI INDICATOR 7.5 (GLOVE) ×2
GLOVE ECLIPSE 7.5 STRL STRAW (GLOVE) ×10 IMPLANT
GLOVE EUDERMIC 7 POWDERFREE (GLOVE) ×10 IMPLANT
GLOVE ORTHO TXT STRL SZ7.5 (GLOVE) IMPLANT
GOWN STRL REUS W/ TWL LRG LVL3 (GOWN DISPOSABLE) ×18 IMPLANT
GOWN STRL REUS W/ TWL XL LVL3 (GOWN DISPOSABLE) ×6 IMPLANT
GOWN STRL REUS W/TWL LRG LVL3 (GOWN DISPOSABLE) ×12
GOWN STRL REUS W/TWL XL LVL3 (GOWN DISPOSABLE) ×4
HEMOSTAT POWDER SURGIFOAM 1G (HEMOSTASIS) ×15 IMPLANT
HEMOSTAT SURGICEL 2X14 (HEMOSTASIS) ×5 IMPLANT
INSERT FOGARTY 61MM (MISCELLANEOUS) IMPLANT
INSERT FOGARTY XLG (MISCELLANEOUS) IMPLANT
KIT BASIN OR (CUSTOM PROCEDURE TRAY) ×5 IMPLANT
KIT CATH CPB BARTLE (MISCELLANEOUS) ×5 IMPLANT
KIT SUCTION CATH 14FR (SUCTIONS) ×5 IMPLANT
KIT TURNOVER KIT B (KITS) ×5 IMPLANT
KIT VASOVIEW HEMOPRO 2 VH 4000 (KITS) ×5 IMPLANT
NS IRRIG 1000ML POUR BTL (IV SOLUTION) ×25 IMPLANT
PACK E OPEN HEART (SUTURE) ×5 IMPLANT
PACK OPEN HEART (CUSTOM PROCEDURE TRAY) ×5 IMPLANT
PAD ARMBOARD 7.5X6 YLW CONV (MISCELLANEOUS) ×10 IMPLANT
PAD ELECT DEFIB RADIOL ZOLL (MISCELLANEOUS) ×5 IMPLANT
PENCIL BUTTON HOLSTER BLD 10FT (ELECTRODE) ×5 IMPLANT
PUNCH AORTIC ROTATE 4.0MM (MISCELLANEOUS) IMPLANT
PUNCH AORTIC ROTATE 4.5MM 8IN (MISCELLANEOUS) ×5 IMPLANT
PUNCH AORTIC ROTATE 5MM 8IN (MISCELLANEOUS) IMPLANT
SOLUTION ANTI FOG 6CC (MISCELLANEOUS) ×5 IMPLANT
SPONGE INTESTINAL PEANUT (DISPOSABLE) IMPLANT
SPONGE LAP 18X18 X RAY DECT (DISPOSABLE) IMPLANT
SPONGE LAP 4X18 RFD (DISPOSABLE) ×5 IMPLANT
SUT BONE WAX W31G (SUTURE) ×5 IMPLANT
SUT ETHIBOND 2 0 SH (SUTURE) ×2
SUT ETHIBOND 2 0 SH 36X2 (SUTURE) ×3 IMPLANT
SUT MNCRL AB 4-0 PS2 18 (SUTURE) ×5 IMPLANT
SUT PROLENE 3 0 SH DA (SUTURE) IMPLANT
SUT PROLENE 3 0 SH1 36 (SUTURE) ×5 IMPLANT
SUT PROLENE 4 0 RB 1 (SUTURE) ×4
SUT PROLENE 4 0 SH DA (SUTURE) IMPLANT
SUT PROLENE 4-0 RB1 .5 CRCL 36 (SUTURE) ×6 IMPLANT
SUT PROLENE 5 0 C 1 36 (SUTURE) IMPLANT
SUT PROLENE 6 0 C 1 30 (SUTURE) IMPLANT
SUT PROLENE 7 0 BV 1 (SUTURE) IMPLANT
SUT PROLENE 7 0 BV1 MDA (SUTURE) ×10 IMPLANT
SUT PROLENE 8 0 BV175 6 (SUTURE) IMPLANT
SUT SILK  1 MH (SUTURE)
SUT SILK 1 MH (SUTURE) IMPLANT
SUT STEEL STERNAL CCS#1 18IN (SUTURE) IMPLANT
SUT STEEL SZ 6 DBL 3X14 BALL (SUTURE) IMPLANT
SUT TEM PAC WIRE 2 0 SH (SUTURE) ×5 IMPLANT
SUT VIC AB 1 CTX 36 (SUTURE) ×4
SUT VIC AB 1 CTX36XBRD ANBCTR (SUTURE) ×6 IMPLANT
SUT VIC AB 2-0 CT1 27 (SUTURE) ×2
SUT VIC AB 2-0 CT1 TAPERPNT 27 (SUTURE) ×3 IMPLANT
SUT VIC AB 2-0 CTX 27 (SUTURE) IMPLANT
SUT VIC AB 3-0 SH 27 (SUTURE)
SUT VIC AB 3-0 SH 27X BRD (SUTURE) IMPLANT
SUT VIC AB 3-0 X1 27 (SUTURE) IMPLANT
SUT VICRYL 4-0 PS2 18IN ABS (SUTURE) IMPLANT
SYSTEM SAHARA CHEST DRAIN ATS (WOUND CARE) ×5 IMPLANT
TAPE CLOTH SURG 4X10 WHT LF (GAUZE/BANDAGES/DRESSINGS) ×5 IMPLANT
TAPE PAPER MEDFIX 1IN X 10YD (GAUZE/BANDAGES/DRESSINGS) ×5 IMPLANT
TOWEL GREEN STERILE (TOWEL DISPOSABLE) ×5 IMPLANT
TOWEL GREEN STERILE FF (TOWEL DISPOSABLE) ×5 IMPLANT
TRAY FOLEY SLVR 16FR TEMP STAT (SET/KITS/TRAYS/PACK) ×5 IMPLANT
TUBING INSUFFLATION (TUBING) ×5 IMPLANT
UNDERPAD 30X30 (UNDERPADS AND DIAPERS) ×5 IMPLANT
WATER STERILE IRR 1000ML POUR (IV SOLUTION) ×10 IMPLANT

## 2018-01-28 NOTE — OR Nursing (Signed)
15 min called to [facility]

## 2018-01-28 NOTE — Op Note (Addendum)
CARDIOVASCULAR SURGERY OPERATIVE NOTE  01/28/2018  Surgeon:  Gaye Pollack, MD  First Assistant: Nicholes Rough,  PA-C   Preoperative Diagnosis:  Severe left main and multi-vessel coronary artery disease   Postoperative Diagnosis:  Same   Procedure:  1. Median Sternotomy 2. Extracorporeal circulation 3.   Coronary artery bypass grafting x 4   Left internal mammary graft to the LAD  SVG to OM1  Sequential SVG to PDA and PL  4.   Endoscopic vein harvest from the left leg  5.   Clipping of left atrial appendage.  Anesthesia:  General Endotracheal   Clinical History/Surgical Indication:  The patient is a 77 year old gentleman with history of hypertension, hyperlipidemia, chronic atrial fibrillation on Eliquis, and a history of coronary disease status post stenting of his right coronary artery by Dr. Alvester Chou approximately 15 years ago according to the patient.  He has done well and goes to the gym 5 days/week with no exercise limitation and had no chest pain or shortness of breath until an episode of chest pain on 01/21/2018 which did not resolve.  His troponin was mildly elevated at 0.43 and he ruled in for non-ST segment elevation MI.  Cardiac catheterization was performed today and showed 90% distal left main stenosis.  The LAD had about 90% proximal to mid stenosis.  The left circumflex was a small vessel.  The right coronary artery had a patent stent in the midportion with about 90% stenosis just proximal to the stent.  An echocardiogram showed left ventricular ejection fraction 55 to 60% with normal wall motion.  There is no significant valvular disease.  The aortic root was noted to be 37 mm.  I have personally reviewed his cardiac catheterization and echocardiogram and discussed the results with the patient and his wife.  I have personally interviewed and examined the patient.  I agree  with the evaluation and assessment as noted below by Ellwood Handler, PA-C.  I agree that coronary bypass graft surgery is the best treatment to prevent further ischemia and infarction and death to the left main occlusion. I discussed the operative procedure with the patient and family including alternatives, benefits and risks; including but not limited to bleeding, blood transfusion, infection, stroke, myocardial infarction, graft failure, heart block requiring a permanent pacemaker, organ dysfunction, and death.  Toma Copier understands and agrees to proceed.  Preparation:  The patient was seen in the preoperative holding area and the correct patient, correct operation were confirmed with the patient after reviewing the medical record and catheterization. The consent was signed by me. Preoperative antibiotics were given. A pulmonary arterial line and radial arterial line were placed by the anesthesia team. The patient was taken back to the operating room and positioned supine on the operating room table. After being placed under general endotracheal anesthesia by the anesthesia team a foley catheter was placed. The neck, chest, abdomen, and both legs were prepped with betadine soap and solution and draped in the usual sterile manner. A surgical time-out was taken and the correct patient and operative procedure were confirmed with the nursing and anesthesia staff.  TEE: performed by Dr. Suzette Battiest. This showed normal LV systolic function. Mild MR.    Cardiopulmonary Bypass:  A median sternotomy was performed. The pericardium was opened in the midline. The heart was covered in fat. Right ventricular function appeared normal. The ascending aorta was of normal size and had calcified plaque posterior in the mid to distal ascending aorta. There were no  contraindications to aortic cannulation or cross-clamping. The patient was fully systemically heparinized and the ACT was maintained > 400 sec. The  proximal aortic arch was cannulated with a 95 F aortic cannula for arterial inflow. Venous cannulation was performed via the right atrial appendage using a two-staged venous cannula. An antegrade cardioplegia/vent cannula was inserted into the mid-ascending aorta. Aortic occlusion was performed with a single cross-clamp. Systemic cooling to 32 degrees Centigrade and topical cooling of the heart with iced saline were used. Hyperkalemic antegrade cold blood cardioplegia was used to induce diastolic arrest and was then given at about 20 minute intervals throughout the period of arrest to maintain myocardial temperature at or below 10 degrees centigrade. A temperature probe was inserted into the interventricular septum and an insulating pad was placed in the pericardium.   Left internal mammary harvest:  The left side of the sternum was retracted using the Rultract retractor. The left internal mammary artery was harvested as a pedicle graft. All side branches were clipped. It was a medium-sized vessel of good quality with excellent blood flow. It was ligated distally and divided. It was sprayed with topical papaverine solution to prevent vasospasm.   Endoscopic vein harvest:  The left greater saphenous vein was harvested endoscopically through a 2 cm incision medial to the left knee. It was harvested from the upper thigh to below the knee. It was a medium-sized vein of good quality. The side branches were all ligated with 4-0 silk ties.    Coronary arteries:  The coronary arteries were examined.   LAD:  Moderate sized vessel with mild distal disease. Lying deep in the epicardial fat.  LCX:  OM1 and OM2 both small. OM1 graftable but OM2 was diffusely diseased. They communicated well on cath.  RCA:  Diffusely diseased. PDA was severely diseased with calcific plaque proximally but the mid to distal vessel was softer. The PL had minimal disease.   Grafts:  1. LIMA to the LAD: 1.76 mm. It was sewn  end to side using 8-0 prolene continuous suture. 2. SVG to OM1:  1.6 mm. It was sewn end to side using 7-0 prolene continuous suture. 3. Sequential SVG to PDA:  1.6 mm. It was sewn sequential side to side using 7-0 prolene continuous suture. 4. Sequential SVG to PL:  1.75 mm. It was sewn sequential end to side using 7-0 prolene continuous suture.  The proximal vein graft anastomoses were performed to the mid-ascending aorta using continuous 6-0 prolene suture. Graft markers were placed around the proximal anastomoses.  Ligation of left atrial appendage:    The patient was in atrial fibrillation when taken to the OR and has been in atrial fibrillation on his ECG's dating back to at least 2012. His atria were very thick. The base of the appendage was measured and a 45 mm Atricure Atriclip was chosen. This was placed across the base of the LAA without difficulty.     Completion:  The patient was rewarmed to 37 degrees Centigrade. The clamp was removed from the LIMA pedicle and there was rapid warming of the septum and return of ventricular fibrillation. The crossclamp was removed with a time of 85 minutes. There was spontaneous return of sinus rhythm. The distal and proximal anastomoses were checked for hemostasis. The position of the grafts was satisfactory. Two temporary epicardial pacing wires were placed on the right atrium and two on the right ventricle. The patient was weaned from CPB without difficulty on no inotropes. CPB time was 106  minutes. Cardiac output was 5 LPM. TEE showed normal LV systolic function. Heparin was fully reversed with protamine and the aortic and venous cannulas removed. Hemostasis was achieved. Mediastinal and left pleural drainage tubes were placed. The sternum was closed with double #6 stainless steel wires. The fascia was closed with continuous # 1 vicryl suture. The subcutaneous tissue was closed with 2-0 vicryl continuous suture. The skin was closed with 3-0 vicryl  subcuticular suture. All sponge, needle, and instrument counts were reported correct at the end of the case. Dry sterile dressings were placed over the incisions and around the chest tubes which were connected to pleurevac suction. The patient was then transported to the surgical intensive care unit in stable condition.

## 2018-01-28 NOTE — Anesthesia Procedure Notes (Signed)
Procedure Name: Intubation Date/Time: 01/28/2018 7:41 AM Performed by: Renato Shin, CRNA Pre-anesthesia Checklist: Patient identified, Emergency Drugs available, Suction available and Patient being monitored Patient Re-evaluated:Patient Re-evaluated prior to induction Oxygen Delivery Method: Circle system utilized Preoxygenation: Pre-oxygenation with 100% oxygen Induction Type: IV induction Ventilation: Oral airway inserted - appropriate to patient size and Mask ventilation without difficulty Laryngoscope Size: Miller and 3 Grade View: Grade I Tube type: Oral Tube size: 8.0 mm Number of attempts: 1 Airway Equipment and Method: Stylet Placement Confirmation: ETT inserted through vocal cords under direct vision,  positive ETCO2,  CO2 detector and breath sounds checked- equal and bilateral Secured at: 22 cm Tube secured with: Tape Dental Injury: Teeth and Oropharynx as per pre-operative assessment

## 2018-01-28 NOTE — OR Nursing (Signed)
45 min called to [facility]

## 2018-01-28 NOTE — Procedures (Signed)
Extubation Procedure Note  Patient Details:   Name: [name] DOB: 03/21/1940 MRN: [id number]   Airway Documentation:    Vent end date: 01/28/18 Vent end time: 1742   Evaluation  O2 sats: stable throughout Complications: No apparent complications Patient did tolerate procedure well. Bilateral Breath Sounds: Clear   Pt extubated per rapid wean protocol. VC 1.1L & NIF -30 Placed pt on 4L [place] tolerating well  Ciro Backer 01/28/2018, 5:48 PM

## 2018-01-28 NOTE — Anesthesia Procedure Notes (Signed)
Arterial Line Insertion Start/End11/02/2018 6:40 AM, 01/28/2018 6:45 AM Performed by: CRNA  Patient location: Pre-op. Preanesthetic checklist: patient identified, IV checked, site marked, risks and benefits discussed, surgical consent, monitors and equipment checked, pre-op evaluation, timeout performed and anesthesia consent Lidocaine 1% used for infiltration and patient sedated Left, radial was placed Catheter size: 20 G Hand hygiene performed , maximum sterile barriers used  and Seldinger technique used Allen's test indicative of satisfactory collateral circulation Attempts: 1 Procedure performed without using ultrasound guided technique. Following insertion, dressing applied and Biopatch. Post procedure assessment: normal  Patient tolerated the procedure well with no immediate complications.

## 2018-01-28 NOTE — Transfer of Care (Signed)
Immediate Anesthesia Transfer of Care Note  Patient: [name]  Procedure(s) Performed: CORONARY ARTERY BYPASS GRAFTING (CABG) X 4 USING LEFT INTERNAL MAMMARY ARTERY AND LEFT GREATER SAPHENOUS VEIN HARVESTED ENDOSCOPICALLY (N/A Chest) TRANSESOPHAGEAL ECHOCARDIOGRAM (TEE) (N/A ) CLIPPING OF ATRIAL APPENDAGE  Patient Location: SICU  Anesthesia Type:General  Level of Consciousness: sedated, unresponsive and Patient remains intubated per anesthesia plan  Airway & Oxygen Therapy: Patient remains intubated per anesthesia plan and Patient placed on Ventilator (see vital sign flow sheet for setting)  Post-op Assessment: Report given to RN and Post -op Vital signs reviewed and stable  Post vital signs: Reviewed and stable  Last Vitals:  Vitals Value Taken Time  BP    Temp    Pulse    Resp 13 01/28/2018 12:56 PM  SpO2    Vitals shown include unvalidated device data.  Last Pain:  Vitals:   01/28/18 0439  TempSrc: Oral  PainSc:       Patients Stated Pain Goal: 0 (22/57/50 5183)  Complications: No apparent anesthesia complications

## 2018-01-28 NOTE — Anesthesia Procedure Notes (Signed)
Central Venous Catheter Insertion Performed by: Lillia Abed, MD, anesthesiologist Start/End11/02/2018 6:45 AM, 01/28/2018 7:00 AM Patient location: Pre-op. Preanesthetic checklist: patient identified, IV checked, risks and benefits discussed, surgical consent, monitors and equipment checked, pre-op evaluation, timeout performed and anesthesia consent Position: Trendelenburg Lidocaine 1% used for infiltration and patient sedated Hand hygiene performed  and maximum sterile barriers used  Catheter size: 8.5 Fr PA cath was placed.Sheath introducer Procedure performed using ultrasound guided technique. Ultrasound Notes:anatomy identified, needle tip was noted to be adjacent to the nerve/plexus identified, no ultrasound evidence of intravascular and/or intraneural injection and image(s) printed for medical record Attempts: 1 Following insertion, line sutured, dressing applied and Biopatch. Post procedure assessment: blood return through all ports, free fluid flow and no air  Patient tolerated the procedure well with no immediate complications.

## 2018-01-28 NOTE — Progress Notes (Signed)
  Echocardiogram Echocardiogram Transesophageal has been performed.  [name] 01/28/2018, 8:24 AM

## 2018-01-28 NOTE — Anesthesia Postprocedure Evaluation (Signed)
Anesthesia Post Note  Patient: JAVEL HERSH  Procedure(s) Performed: CORONARY ARTERY BYPASS GRAFTING (CABG) X 4 USING LEFT INTERNAL MAMMARY ARTERY AND LEFT GREATER SAPHENOUS VEIN HARVESTED ENDOSCOPICALLY (N/A Chest) TRANSESOPHAGEAL ECHOCARDIOGRAM (TEE) (N/A ) CLIPPING OF ATRIAL APPENDAGE     Patient location during evaluation: SICU Anesthesia Type: General Level of consciousness: sedated Pain management: pain level controlled Vital Signs Assessment: post-procedure vital signs reviewed and stable Respiratory status: patient remains intubated per anesthesia plan Cardiovascular status: stable Postop Assessment: no apparent nausea or vomiting Anesthetic complications: no    Last Vitals:  Vitals:   01/28/18 1500 01/28/18 1515  BP: 104/70 133/84  Pulse: 80 80  Resp: 12 12  Temp: (!) 36.4 C 36.5 C  SpO2: 100% 100%    Last Pain:  Vitals:   01/28/18 1400  TempSrc: Core  PainSc:                  Tiajuana Amass

## 2018-01-28 NOTE — Plan of Care (Signed)
  Problem: Education: Goal: Knowledge of General Education information will improve Description Including pain rating scale, medication(s)/side effects and non-pharmacologic comfort measures Outcome: Progressing   Problem: Health Behavior/Discharge Planning: Goal: Ability to manage health-related needs will improve Outcome: Progressing   Problem: Clinical Measurements: Goal: Ability to maintain clinical measurements within normal limits will improve Outcome: Progressing Goal: Will remain free from infection Outcome: Progressing Goal: Diagnostic test results will improve Outcome: Progressing Goal: Respiratory complications will improve Outcome: Progressing Goal: Cardiovascular complication will be avoided Outcome: Progressing   Problem: Activity: Goal: Risk for activity intolerance will decrease Outcome: Progressing   Problem: Nutrition: Goal: Adequate nutrition will be maintained Outcome: Progressing   Problem: Coping: Goal: Level of anxiety will decrease Outcome: Progressing   Problem: Elimination: Goal: Will not experience complications related to bowel motility Outcome: Progressing Goal: Will not experience complications related to urinary retention Outcome: Progressing   Problem: Safety: Goal: Ability to remain free from injury will improve Outcome: Progressing   Problem: Education: Goal: Understanding of CV disease, CV risk reduction, and recovery process will improve Outcome: Progressing Goal: Individualized Educational Video(s) Outcome: Progressing   Problem: Activity: Goal: Ability to return to baseline activity level will improve Outcome: Progressing   Problem: Cardiovascular: Goal: Ability to achieve and maintain adequate cardiovascular perfusion will improve Outcome: Progressing Goal: Vascular access site(s) Level 0-1 will be maintained Outcome: Progressing   Problem: Health Behavior/Discharge Planning: Goal: Ability to safely manage  health-related needs after discharge will improve Outcome: Progressing

## 2018-01-28 NOTE — Brief Op Note (Signed)
01/21/2018 - 01/28/2018  4:05 PM  PATIENT:  [name]  77 y.o. male  PRE-OPERATIVE DIAGNOSIS:  CAD  POST-OPERATIVE DIAGNOSIS:  CAD  PROCEDURE:  Procedure(s): CORONARY ARTERY BYPASS GRAFTING (CABG) X 4 USING LEFT INTERNAL MAMMARY ARTERY AND LEFT GREATER SAPHENOUS VEIN HARVESTED ENDOSCOPICALLY (N/A) TRANSESOPHAGEAL ECHOCARDIOGRAM (TEE) (N/A) CLIPPING OF ATRIAL APPENDAGE  SURGEON:  Surgeon(s) and Role:    * Bartle, Fernande Boyden, MD - Primary  PHYSICIAN ASSISTANT:  Nicholes Rough, PA-C   ANESTHESIA:   general  EBL:  900 mL   BLOOD ADMINISTERED:none  DRAINS: ROUTINE   LOCAL MEDICATIONS USED:  NONE  SPECIMEN:  No Specimen  DISPOSITION OF SPECIMEN:  N/A  COUNTS:  YES  DICTATION: .Dragon Dictation  PLAN OF CARE: Admit to inpatient   PATIENT DISPOSITION:  ICU - intubated and hemodynamically stable.   Delay start of Pharmacological VTE agent (>24hrs) due to surgical blood loss or risk of bleeding: yes

## 2018-01-28 NOTE — Progress Notes (Signed)
TCTS BRIEF SICU PROGRESS NOTE  Day of Surgery  S/P Procedure(s) (LRB): CORONARY ARTERY BYPASS GRAFTING (CABG) X 4 USING LEFT INTERNAL MAMMARY ARTERY AND LEFT GREATER SAPHENOUS VEIN HARVESTED ENDOSCOPICALLY (N/A) TRANSESOPHAGEAL ECHOCARDIOGRAM (TEE) (N/A) CLIPPING OF ATRIAL APPENDAGE   Extubated uneventfully Vpaced w/ stable hemodynamics, no drips Breathing comfortably O2 sats 100% Chest tube output low UOP > 100 mL/hr Labs okay  Plan: Continue routine early postop  [name] Alberts, MD 01/28/2018 6:11 PM

## 2018-01-29 ENCOUNTER — Encounter (HOSPITAL_COMMUNITY): Payer: Self-pay | Admitting: Surgery

## 2018-01-29 ENCOUNTER — Inpatient Hospital Stay (HOSPITAL_COMMUNITY): Payer: Medicare Other

## 2018-01-29 LAB — POCT I-STAT, CHEM 8
BUN: 25 mg/dL — ABNORMAL HIGH (ref 8–23)
CREATININE: 1 mg/dL (ref 0.61–1.24)
Calcium, Ion: 1.19 mmol/L (ref 1.15–1.40)
Chloride: 99 mmol/L (ref 98–111)
GLUCOSE: 138 mg/dL — AB (ref 70–99)
HCT: 40 % (ref 39.0–52.0)
HEMOGLOBIN: 13.6 g/dL (ref 13.0–17.0)
Potassium: 4.4 mmol/L (ref 3.5–5.1)
Sodium: 135 mmol/L (ref 135–145)
TCO2: 27 mmol/L (ref 22–32)

## 2018-01-29 LAB — CBC
HCT: 40.9 % (ref 39.0–52.0)
HEMATOCRIT: 40.9 % (ref 39.0–52.0)
Hemoglobin: 13.5 g/dL (ref 13.0–17.0)
Hemoglobin: 13.4 g/dL (ref 13.0–17.0)
MCH: 32.2 pg (ref 26.0–34.0)
MCH: 32.4 pg (ref 26.0–34.0)
MCHC: 33 g/dL (ref 30.0–36.0)
MCHC: 32.8 g/dL (ref 30.0–36.0)
MCV: 97.6 fL (ref 80.0–100.0)
MCV: 98.8 fL (ref 80.0–100.0)
NRBC: 0 % (ref 0.0–0.2)
PLATELETS: 111 10*3/uL — AB (ref 150–400)
PLATELETS: 120 10*3/uL — AB (ref 150–400)
RBC: 4.19 MIL/uL — ABNORMAL LOW (ref 4.22–5.81)
RBC: 4.14 MIL/uL — AB (ref 4.22–5.81)
RDW: 12.6 % (ref 11.5–15.5)
RDW: 12.6 % (ref 11.5–15.5)
WBC: 11.3 10*3/uL — AB (ref 4.0–10.5)
WBC: 12 10*3/uL — AB (ref 4.0–10.5)
nRBC: 0 % (ref 0.0–0.2)

## 2018-01-29 LAB — GLUCOSE, CAPILLARY
GLUCOSE-CAPILLARY: 110 mg/dL — AB (ref 70–99)
GLUCOSE-CAPILLARY: 110 mg/dL — AB (ref 70–99)
GLUCOSE-CAPILLARY: 112 mg/dL — AB (ref 70–99)
GLUCOSE-CAPILLARY: 114 mg/dL — AB (ref 70–99)
GLUCOSE-CAPILLARY: 121 mg/dL — AB (ref 70–99)
Glucose-Capillary: 112 mg/dL — ABNORMAL HIGH (ref 70–99)
Glucose-Capillary: 79 mg/dL (ref 70–99)
Glucose-Capillary: 115 mg/dL — ABNORMAL HIGH (ref 70–99)
Glucose-Capillary: 112 mg/dL — ABNORMAL HIGH (ref 70–99)
Glucose-Capillary: 122 mg/dL — ABNORMAL HIGH (ref 70–99)
Glucose-Capillary: 127 mg/dL — ABNORMAL HIGH (ref 70–99)

## 2018-01-29 LAB — BASIC METABOLIC PANEL
ANION GAP: 6 (ref 5–15)
BUN: 19 mg/dL (ref 8–23)
CALCIUM: 8.4 mg/dL — AB (ref 8.9–10.3)
CO2: 22 mmol/L (ref 22–32)
CREATININE: 0.79 mg/dL (ref 0.61–1.24)
Chloride: 106 mmol/L (ref 98–111)
GFR calc Af Amer: >60 mL/min (ref >60)
GFR calc non Af Amer: >60 mL/min (ref >60)
GLUCOSE: 120 mg/dL — AB (ref 70–99)
Potassium: 4.5 mmol/L (ref 3.5–5.1)
Sodium: 134 mmol/L — ABNORMAL LOW (ref 135–145)

## 2018-01-29 LAB — MAGNESIUM
Magnesium: 2.4 mg/dL (ref 1.7–2.4)
Magnesium: 2.2 mg/dL (ref 1.7–2.4)

## 2018-01-29 LAB — CREATININE, SERUM
CREATININE: 1.03 mg/dL (ref 0.61–1.24)
GFR calc Af Amer: >60 mL/min (ref >60)

## 2018-01-29 MED ORDER — INSULIN ASPART 100 UNIT/ML ~~LOC~~ SOLN
0.0000 [IU] | Freq: Three times a day (TID) | SUBCUTANEOUS | Status: DC
  Administered 2018-01-29 (×2): 2 [IU] via SUBCUTANEOUS

## 2018-01-29 MED ORDER — FUROSEMIDE 10 MG/ML IJ SOLN
40.0000 mg | Freq: Two times a day (BID) | INTRAMUSCULAR | Status: AC
  Administered 2018-01-29 (×2): 40 mg via INTRAVENOUS
  Filled 2018-01-29 (×2): qty 4

## 2018-01-29 MED ORDER — TRAMADOL HCL 50 MG PO TABS
50.0000 mg | ORAL_TABLET | ORAL | Status: DC | PRN
  Administered 2018-01-29 (×2): 50 mg via ORAL
  Filled 2018-01-29 (×2): qty 1

## 2018-01-29 MED ORDER — POTASSIUM CHLORIDE CRYS ER 20 MEQ PO TBCR
20.0000 meq | EXTENDED_RELEASE_TABLET | Freq: Two times a day (BID) | ORAL | Status: AC
  Administered 2018-01-29 (×2): 20 meq via ORAL
  Filled 2018-01-29 (×2): qty 1

## 2018-01-29 MED ORDER — ENOXAPARIN SODIUM 40 MG/0.4ML ~~LOC~~ SOLN
40.0000 mg | Freq: Every day | SUBCUTANEOUS | Status: DC
  Administered 2018-01-29: 40 mg via SUBCUTANEOUS
  Filled 2018-01-29: qty 0.4

## 2018-01-29 MED ORDER — HYDROCERIN EX CREA
TOPICAL_CREAM | Freq: Two times a day (BID) | CUTANEOUS | Status: DC | PRN
  Administered 2018-02-02: 10:00:00 via TOPICAL
  Filled 2018-01-29 (×2): qty 113

## 2018-01-29 MED ORDER — KETOROLAC TROMETHAMINE 15 MG/ML IJ SOLN
15.0000 mg | Freq: Four times a day (QID) | INTRAMUSCULAR | Status: DC
  Administered 2018-01-29 – 2018-01-31 (×8): 15 mg via INTRAVENOUS
  Filled 2018-01-29 (×8): qty 1

## 2018-01-29 MED FILL — Thrombin (Recombinant) For Soln 20000 Unit: CUTANEOUS | Qty: 1 | Status: AC

## 2018-01-29 NOTE — Progress Notes (Signed)
   Transient bradycardia with permanent atrial fibrillation.  Holding beta-blocker.  Soon will restart apixaban and recommend decreasing his aspirin to 81 mg at that time.  [name], MD

## 2018-01-29 NOTE — Progress Notes (Signed)
      [address]       [place] [address]             [phone]      POD # 1 CABG Up in chair BP 118/65   Pulse 71   Temp 98 F (36.7 C) (Oral)   Resp 15   Ht 6\' 3"  (1.905 m)   Wt 108 kg   SpO2 93%   BMI 29.76 kg/m   Intake/Output Summary (Last 24 hours) at 01/29/2018 1730 Last data filed at 01/29/2018 1600 Gross per 24 hour  Intake 977.61 ml  Output 2150 ml  Net -1172.39 ml  brady earlier, rate OK at present  K= 4.4, creatinine =1 Hct= 40  Doing well POD # 1  Samanda Buske C. Roxan Hockey, MD [facility] [phone]

## 2018-01-29 NOTE — Progress Notes (Signed)
1 Day Post-Op Procedure(s) (LRB): CORONARY ARTERY BYPASS GRAFTING (CABG) X 4 USING LEFT INTERNAL MAMMARY ARTERY AND LEFT GREATER SAPHENOUS VEIN HARVESTED ENDOSCOPICALLY (N/A) TRANSESOPHAGEAL ECHOCARDIOGRAM (TEE) (N/A) CLIPPING OF ATRIAL APPENDAGE Subjective: No specific complaints.  Objective: Vital signs in last 24 hours: Temp:  [96.3 F (35.7 C)-99 F (37.2 C)] 98.1 F (36.7 C) (11/13 0700) Pulse Rate:  [40-90] 89 (11/13 0700) Cardiac Rhythm: Ventricular paced (11/13 0400) Resp:  [8-29] 18 (11/13 0700) BP: (99-141)/(52-97) 124/77 (11/13 0700) SpO2:  [80 %-100 %] 97 % (11/13 0700) Arterial Line BP: (92-153)/(50-88) 140/64 (11/13 0700) FiO2 (%):  [40 %-50 %] 40 % (11/12 1716) Weight:  [333 kg] 108 kg (11/13 0500)  Hemodynamic parameters for last 24 hours: PAP: (19-63)/(7-27) 37/17 CO:  [3 L/min-5.4 L/min] 5.2 L/min CI:  [1.3 L/min/m2-2.3 L/min/m2] 2.3 L/min/m2  Intake/Output from previous day: 11/12 0701 - 11/13 0700 In: 5255.6 [I.V.:3456.1; Blood:600; IV Piggyback:1199.5] Out: 8329 [VBTYO:0600; Blood:900; Chest Tube:408] Intake/Output this shift: No intake/output data recorded.  General appearance: alert and cooperative Neurologic: intact Heart: irregularly irregular rhythm Lungs: clear to auscultation bilaterally Extremities: edema mild Wound: dressings dry  Lab Results: Recent Labs    01/28/18 1829 01/28/18 1839 01/29/18 0302  WBC 11.2*  --  11.3*  HGB 13.6 13.3 13.5  HCT 41.6 39.0 40.9  PLT 110*  --  111*   BMET:  Recent Labs    01/28/18 0209  01/28/18 1839 01/29/18 0302  NA 136   < > 138 134*  K 4.1   < > 4.7 4.5  CL 103  --  104 106  CO2 24  --   --  22  GLUCOSE 96   < > 126* 120*  BUN 21  --  19 19  CREATININE 1.01   < > 0.70 0.79  CALCIUM 9.5  --   --  8.4*   < > = values in this interval not displayed.    PT/INR:  Recent Labs    01/28/18 1308  LABPROT 16.3*  INR 1.33   ABG    Component Value Date/Time   PHART 7.347 (L) 01/28/2018  1835   HCO3 24.3 01/28/2018 1835   TCO2 27 01/28/2018 1839   ACIDBASEDEF 2.0 01/28/2018 1835   O2SAT 99.0 01/28/2018 1835   CBG (last 3)  Recent Labs    01/29/18 0600 01/29/18 0658 01/29/18 0806  GLUCAP 112* 115* 112*   CXR: ok   Assessment/Plan: S/P Procedure(s) (LRB): CORONARY ARTERY BYPASS GRAFTING (CABG) X 4 USING LEFT INTERNAL MAMMARY ARTERY AND LEFT GREATER SAPHENOUS VEIN HARVESTED ENDOSCOPICALLY (N/A) TRANSESOPHAGEAL ECHOCARDIOGRAM (TEE) (N/A) CLIPPING OF ATRIAL APPENDAGE  POD 1 Hemodynamically stable  Chronic atrial fib with controlled rate. He was bradycardia preop after Lopressor so will not start beta blocker in this patient. Plan to send home on Eliquis and ASA 81 but will wait until pacing wires out. DC chest tubes, swan and arterial line. IS, mobilize Diurese.   LOS: 8 days    [name] 01/29/2018

## 2018-01-29 NOTE — Plan of Care (Signed)
  Problem: Education: Goal: Knowledge of disease or condition will improve Outcome: Progressing Goal: Knowledge of the prescribed therapeutic regimen will improve Outcome: Progressing   Problem: Cardiac: Goal: Will achieve and/or maintain hemodynamic stability Outcome: Progressing   Problem: Clinical Measurements: Goal: Postoperative complications will be avoided or minimized Outcome: Progressing   Problem: Respiratory: Goal: Respiratory status will improve Outcome: Progressing   Problem: Skin Integrity: Goal: Risk for impaired skin integrity will decrease Outcome: Progressing   Problem: Urinary Elimination: Goal: Ability to achieve and maintain adequate renal perfusion and functioning will improve Outcome: Progressing   Problem: Activity: Goal: Risk for activity intolerance will decrease Outcome: Not Progressing

## 2018-01-30 ENCOUNTER — Inpatient Hospital Stay (HOSPITAL_COMMUNITY): Payer: Medicare Other

## 2018-01-30 DIAGNOSIS — Z951 Presence of aortocoronary bypass graft: Secondary | ICD-10-CM

## 2018-01-30 LAB — CBC
HEMATOCRIT: 39.6 % (ref 39.0–52.0)
HEMOGLOBIN: 12.3 g/dL — AB (ref 13.0–17.0)
MCH: 30.9 pg (ref 26.0–34.0)
MCHC: 31.1 g/dL (ref 30.0–36.0)
MCV: 99.5 fL (ref 80.0–100.0)
Platelets: 108 10*3/uL — ABNORMAL LOW (ref 150–400)
RBC: 3.98 MIL/uL — ABNORMAL LOW (ref 4.22–5.81)
RDW: 12.8 % (ref 11.5–15.5)
WBC: 11.3 10*3/uL — ABNORMAL HIGH (ref 4.0–10.5)
nRBC: 0 % (ref 0.0–0.2)

## 2018-01-30 LAB — GLUCOSE, CAPILLARY
Glucose-Capillary: 138 mg/dL — ABNORMAL HIGH (ref 70–99)
Glucose-Capillary: 97 mg/dL (ref 70–99)

## 2018-01-30 LAB — BASIC METABOLIC PANEL
Anion gap: 7 (ref 5–15)
BUN: 27 mg/dL — AB (ref 8–23)
CHLORIDE: 101 mmol/L (ref 98–111)
CO2: 26 mmol/L (ref 22–32)
CREATININE: 0.86 mg/dL (ref 0.61–1.24)
Calcium: 8.5 mg/dL — ABNORMAL LOW (ref 8.9–10.3)
GFR calc Af Amer: >60 mL/min (ref >60)
GFR calc non Af Amer: >60 mL/min (ref >60)
GLUCOSE: 113 mg/dL — AB (ref 70–99)
Potassium: 4.3 mmol/L (ref 3.5–5.1)
SODIUM: 134 mmol/L — AB (ref 135–145)

## 2018-01-30 MED ORDER — POTASSIUM CHLORIDE CRYS ER 20 MEQ PO TBCR
20.0000 meq | EXTENDED_RELEASE_TABLET | Freq: Every day | ORAL | Status: AC
  Administered 2018-01-31 – 2018-02-02 (×3): 20 meq via ORAL
  Filled 2018-01-30 (×3): qty 1

## 2018-01-30 MED ORDER — SODIUM CHLORIDE 0.9% FLUSH: 3.0000 mL | INTRAVENOUS | Status: DC | PRN

## 2018-01-30 MED ORDER — SODIUM CHLORIDE 0.9% FLUSH
3.0000 mL | Freq: Two times a day (BID) | INTRAVENOUS | Status: DC
  Administered 2018-01-30 – 2018-02-03 (×9): 3 mL via INTRAVENOUS

## 2018-01-30 MED ORDER — ONDANSETRON HCL 4 MG/2ML IJ SOLN: 4.0000 mg | Freq: Four times a day (QID) | INTRAMUSCULAR | Status: DC | PRN

## 2018-01-30 MED ORDER — BISACODYL 5 MG PO TBEC
10.0000 mg | DELAYED_RELEASE_TABLET | Freq: Every day | ORAL | Status: DC | PRN
  Administered 2018-02-01: 10 mg via ORAL
  Filled 2018-01-30: qty 2

## 2018-01-30 MED ORDER — OXYCODONE HCL 5 MG PO TABS
5.0000 mg | ORAL_TABLET | ORAL | Status: DC | PRN
  Administered 2018-01-31 – 2018-02-03 (×4): 10 mg via ORAL
  Filled 2018-01-30 (×4): qty 2

## 2018-01-30 MED ORDER — ONDANSETRON HCL 4 MG PO TABS: 4.0000 mg | ORAL_TABLET | Freq: Four times a day (QID) | ORAL | Status: DC | PRN

## 2018-01-30 MED ORDER — BISACODYL 10 MG RE SUPP
10.0000 mg | Freq: Every day | RECTAL | Status: DC | PRN
  Filled 2018-01-30: qty 1

## 2018-01-30 MED ORDER — ACETAMINOPHEN 325 MG PO TABS: 650.0000 mg | ORAL_TABLET | Freq: Four times a day (QID) | ORAL | Status: DC | PRN

## 2018-01-30 MED ORDER — MOVING RIGHT ALONG BOOK
Freq: Once | Status: AC
  Administered 2018-01-30: 09:00:00
  Filled 2018-01-30: qty 1

## 2018-01-30 MED ORDER — ASPIRIN EC 325 MG PO TBEC
325.0000 mg | DELAYED_RELEASE_TABLET | Freq: Every day | ORAL | Status: DC
  Administered 2018-01-30 – 2018-02-01 (×3): 325 mg via ORAL
  Filled 2018-01-30 (×3): qty 1

## 2018-01-30 MED ORDER — TRAMADOL HCL 50 MG PO TABS
50.0000 mg | ORAL_TABLET | ORAL | Status: DC | PRN
  Administered 2018-01-30 – 2018-01-31 (×3): 50 mg via ORAL
  Filled 2018-01-30 (×4): qty 1

## 2018-01-30 MED ORDER — FUROSEMIDE 40 MG PO TABS
40.0000 mg | ORAL_TABLET | Freq: Every day | ORAL | Status: AC
  Administered 2018-01-31 – 2018-02-02 (×3): 40 mg via ORAL
  Filled 2018-01-30 (×3): qty 1

## 2018-01-30 MED ORDER — PANTOPRAZOLE SODIUM 40 MG PO TBEC
40.0000 mg | DELAYED_RELEASE_TABLET | Freq: Every day | ORAL | Status: DC
  Administered 2018-01-30 – 2018-02-03 (×5): 40 mg via ORAL
  Filled 2018-01-30 (×5): qty 1

## 2018-01-30 MED ORDER — SODIUM CHLORIDE 0.9 % IV SOLN: 250.0000 mL | INTRAVENOUS | Status: DC | PRN

## 2018-01-30 MED ORDER — DOCUSATE SODIUM 100 MG PO CAPS
200.0000 mg | ORAL_CAPSULE | Freq: Every day | ORAL | Status: DC
  Administered 2018-01-30 – 2018-02-03 (×5): 200 mg via ORAL
  Filled 2018-01-30 (×5): qty 2

## 2018-01-30 MED ORDER — POTASSIUM CHLORIDE CRYS ER 20 MEQ PO TBCR
20.0000 meq | EXTENDED_RELEASE_TABLET | Freq: Once | ORAL | Status: AC
  Administered 2018-01-30: 20 meq via ORAL
  Filled 2018-01-30: qty 1

## 2018-01-30 MED ORDER — FUROSEMIDE 10 MG/ML IJ SOLN
40.0000 mg | Freq: Once | INTRAMUSCULAR | Status: AC
  Administered 2018-01-30: 40 mg via INTRAVENOUS
  Filled 2018-01-30: qty 4

## 2018-01-30 NOTE — Progress Notes (Signed)
2 Days Post-Op Procedure(s) (LRB): CORONARY ARTERY BYPASS GRAFTING (CABG) X 4 USING LEFT INTERNAL MAMMARY ARTERY AND LEFT GREATER SAPHENOUS VEIN HARVESTED ENDOSCOPICALLY (N/A) TRANSESOPHAGEAL ECHOCARDIOGRAM (TEE) (N/A) CLIPPING OF ATRIAL APPENDAGE Subjective: No complaints Ambulated around the ICU this am.  Objective: Vital signs in last 24 hours: Temp:  [97.8 F (36.6 C)-98.2 F (36.8 C)] 98.1 F (36.7 C) (11/14 0349) Pulse Rate:  [54-78] 66 (11/14 0700) Cardiac Rhythm: Atrial fibrillation (11/14 0400) Resp:  [0-28] 20 (11/14 0700) BP: (100-159)/(52-98) 104/64 (11/14 0700) SpO2:  [93 %-99 %] 93 % (11/14 0700) Arterial Line BP: (129-143)/(51-60) 129/54 (11/13 1000) Weight:  [105.9 kg] 105.9 kg (11/14 0600)  Hemodynamic parameters for last 24 hours: PAP: (32-40)/(7-15) 36/7  Intake/Output from previous day: 11/13 0701 - 11/14 0700 In: 846.8 [P.O.:600; I.V.:46.7; IV Piggyback:200.1] Out: 2240 [Urine:2190; Chest Tube:50] Intake/Output this shift: No intake/output data recorded.  General appearance: alert and cooperative Neurologic: intact Heart: irregular rate and rhythm, S1, S2 normal, no murmur Lungs: clear to auscultation bilaterally Extremities: edema mild Wound: dressings dry  Lab Results: Recent Labs    01/29/18 1530 01/29/18 1553 01/30/18 0432  WBC 12.0*  --  11.3*  HGB 13.4 13.6 12.3*  HCT 40.9 40.0 39.6  PLT 120*  --  108*   BMET:  Recent Labs    01/29/18 0302  01/29/18 1553 01/30/18 0432  NA 134*  --  135 134*  K 4.5  --  4.4 4.3  CL 106  --  99 101  CO2 22  --   --  26  GLUCOSE 120*  --  138* 113*  BUN 19  --  25* 27*  CREATININE 0.79   < > 1.00 0.86  CALCIUM 8.4*  --   --  8.5*   < > = values in this interval not displayed.    PT/INR:  Recent Labs    01/28/18 1308  LABPROT 16.3*  INR 1.33   ABG    Component Value Date/Time   PHART 7.347 (L) 01/28/2018 1835   HCO3 24.3 01/28/2018 1835   TCO2 27 01/29/2018 1553   ACIDBASEDEF 2.0  01/28/2018 1835   O2SAT 99.0 01/28/2018 1835   CBG (last 3)  Recent Labs    01/29/18 1137 01/29/18 1546 01/30/18 0606  GLUCAP 122* 127* 97   CXR: mild left base atelectasis  Assessment/Plan: S/P Procedure(s) (LRB): CORONARY ARTERY BYPASS GRAFTING (CABG) X 4 USING LEFT INTERNAL MAMMARY ARTERY AND LEFT GREATER SAPHENOUS VEIN HARVESTED ENDOSCOPICALLY (N/A) TRANSESOPHAGEAL ECHOCARDIOGRAM (TEE) (N/A) CLIPPING OF ATRIAL APPENDAGE  POD 2 Hemodynamically stable  Chronic atrial fib with controlled rate. No Lopressor due to marked bradycardia preop after Lopressor. Plan to resume Eliquis and low dose ASA after pacing wires out. DC sleeve and foley Continue diuresis Transfer to 4E and continue IS, ambulation.   LOS: 9 days    [name] 01/30/2018

## 2018-01-30 NOTE — Discharge Summary (Signed)
Physician Discharge Summary  Patient ID: [name] MRN: [id number] DOB/AGE: 1940/11/15 77 y.o.  Admit date: 01/21/2018 Discharge date: 02/03/2018  Admission Diagnoses: Patient Active Problem List   Diagnosis Date Noted  . NSTEMI (non-ST elevated myocardial infarction) ([place]) 01/21/2018  . Hx of adenomatous colonic polyps 08/19/2013  . Essential hypertension 11/20/2012  . Hyperlipidemia 11/20/2012  . Permanent atrial fibrillation   . Anticoagulation goal of INR 2 to 3   . Carotid stenosis, asymptomatic   . CAD (coronary artery disease)   . 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  He is very active exercising and attending the gym 5 days per week.  He does weight and cardio training.  He also maintains his yard.  He was in his usual state of health until about a week ago, when he developed a "strange" sensation in his chest.  He states it felt like indigestion, but he knew that it wasn't this.  He states it was not associated with nausea, vomiting, diaphoresis, or shortness of breath.  The patient presented to the ED on 11/5 after the pain did not resolve.  His Troponin level was mildly elevated at 0.43 and he was ruled in for NSTEMI.  He was admitted for further care and cardiac catheterization. This was performed by Dr. Saunders Revel today and showed multivessel CAD.  It was felt coronary bypass grafting would be indicated and TCTS consult was requested.  Currently, the patient was chest pain free.  He again confirmed the above mentioned episodes.  He is a former smoker but quit about 20  years ago.  He remains physically active and understands that he needs to have surgery for his condition.  He has states his carotid disease is 65% on the right and he had a repeat duplex about 1 week ago.    Hospital Course:  As dictated by Nicholes Rough PA-C: [name] underwent a coronary bypass grafting x4 with a clipping of a left atrial appendage with Dr. Cyndia Bent on 01/28/2018.  He tolerated the procedure well and was transferred to the surgical ICU.  He was extubated timely manner.  Postop day 1 he remained hemodynamically stable.  He was in chronic atrial fibrillation with a controlled rate.  He was slightly bradycardic preop after Lopressor, therefore we held beta-blockade on this patient.  We discontinued his chest tubes, Swan-Ganz catheter, and arterial line.  We began to mobilize the patient and started a diuretic regimen for fluid overload.  Postop day 2 he remained in rate controlled atrial fibrillation.  He was hemodynamically stable.  We kept his pacing wires, however once they are removed we plan to restart his Eliquis.  We discontinued his Foley catheter.  He was stable to transfer to 4 E for continued care on 01/31/2018.  Addendum: Epicardial pacing wires were removed on 11/16. Apixaban was then restarted on 02/01/2018. He has been ambulating on room air. He has been tolerating a diet and has had a bowel movement. His wounds are clean, dry, and continuing to heal. Chest tube sutures will be removed in the office after discharge. As  discussed with Dr. Cyndia Bent, patient is felt surgically stable for discharge today.  Consults: Cardiology  Significant Diagnostic Studies: CLINICAL DATA:  CABG  EXAM: PORTABLE CHEST 1 VIEW  COMPARISON:  01/29/2018  FINDINGS: Interval removal of left chest tube and Swan-Ganz catheter. No visible residual pneumothorax. Cardiomegaly with vascular congestion and bibasilar atelectasis. Improved aeration in the lung bases since prior  study.  IMPRESSION: Interval removal of left chest tube without visible pneumothorax.  Improving aeration with decreasing bibasilar atelectasis. Continued cardiomegaly and mild vascular congestion.   Electronically Signed   By: Rolm Baptise M.D.   On: 01/30/2018 08:31   Treatments:   CARDIOVASCULAR SURGERY OPERATIVE NOTE  01/28/2018  Surgeon:  Gaye Pollack, MD  First Assistant: Nicholes Rough,  PA-C   Preoperative Diagnosis:  Severe left main and multi-vessel coronary artery disease   Postoperative Diagnosis:  Same   Procedure:  1. Median Sternotomy 2. Extracorporeal circulation 3.   Coronary artery bypass grafting x 4   Left internal mammary graft to the LAD  SVG to OM1  Sequential SVG to PDA and PL  4.   Endoscopic vein harvest from the left leg  5.   Clipping of left atrial appendage.  Anesthesia:  General Endotracheal  Discharge Exam: Blood pressure (!) 151/70, pulse 97, temperature 98.5 F (36.9 C), temperature source Oral, resp. rate (!) 21, height 6\' 3"  (1.905 m), weight 104.4 kg, SpO2 90 %.  Cardiovascular: [name] Pulmonary: Slightly diminished at bases Abdomen: Soft, non tender, bowel sounds present. Extremities: Mild bilateral lower extremity edema. Wounds: Clean and dry.  No erythema or signs of infection.    Disposition: Discharge disposition: 01-Home or Self Care       Discharge Instructions    Amb Referral to Cardiac Rehabilitation   Complete by:  As directed    Diagnosis:   CABG NSTEMI     CABG X ___:  4     Allergies as of 02/03/2018   No Known Allergies     Medication List    STOP taking these medications   simvastatin 80 MG tablet Commonly known as:  ZOCOR Replaced by:  atorvastatin 80 MG tablet     TAKE these medications   acetaminophen 325 MG tablet Commonly known as:  TYLENOL Take 2 tablets (650 mg total) by mouth every 6 (six) hours as needed for mild pain.   Alpha Lipoic Acid 200  MG Caps Take 600 mg by mouth 2 (two) times daily.   apixaban 5 MG Tabs tablet Commonly known as:  ELIQUIS Take 1 tablet (5 mg total) by mouth 2 (two) times daily. What changed:  how much to take   aspirin 81 MG tablet Take 81 mg by mouth daily.   atorvastatin 80 MG tablet Commonly known as:  LIPITOR Take 1 tablet (80 mg total) by mouth daily at 6 PM. Replaces:  simvastatin 80 MG tablet   cholecalciferol 1000 units tablet Commonly known as:  VITAMIN D Take 1,000 Units by mouth daily.   Coenzyme Q10 200 MG capsule Take 100 mg by mouth 2 (two) times daily.   Fish Oil 1200 MG Caps Take 1,000 mg by mouth daily.   FLAX SEED OIL PO Take 1,400 mg by mouth daily.   furosemide 40 MG tablet Commonly known as:  LASIX Take 1 tablet (40 mg total) by mouth daily. For 5 days then stop.   lisinopril 20 MG tablet Commonly known as:  PRINIVIL,ZESTRIL Take 1 tablet (20 mg total) by mouth  daily.   potassium chloride SA 20 MEQ tablet Commonly known as:  K-DUR,KLOR-CON Take 1 tablet (20 mEq total) by mouth daily. For 5 days then stop.   traMADol 50 MG tablet Commonly known as:  ULTRAM Take 50 mg by mouth every 4-6 hours PRN severe pain.   VITAMIN B 12 PO Take 100 mg by mouth daily.   vitamin C 500 MG tablet Commonly known as:  ASCORBIC ACID Take 1,000 mg by mouth 2 (two) times daily.     The patient has been discharged on:   1.Beta Blocker:  Yes [ ]                               No   [  x ]                              If No, reason:Bradycardia 2.Ace Inhibitor/ARB: Yes [  x ]                                     No  [    ]                                     If No, reason:  3.Statin:   Yes [x  ]                  No  [   ]                  If No, reason:  4.Ecasa:  Yes  [ x ]                  No   [   ]                  If No, reason:    Follow-up Information    Gaye Pollack, MD Follow up.   Specialty:  Cardiothoracic Surgery Why:  Your routine follow-up  appointment is on 03/03/2018 at 2pm. Please arrive at 1:30pm for a chest xray located at [facility] which is on the first floor. Contact information: [address] [place] [address] [phone]        suture removal nursing appointment. Call in 1 day(s).   Why:  Your suture removal appointment is on 02/07/2018 at 10am. Contact information: Dr. Vivi Martens office       Erlene Quan, PA-C Follow up.   Specialties:  Cardiology, Radiology Why:  [facility] office - 02/11/18 - Please arrive 15 minutes early for your 8:00am post-hospital cardiology follow-up visit. Lurena Joiner is one of the PAs that works closely with Dr. Gwenlyn Found. Contact information: [place] [address] [phone]            Signed: Nani Skillern PA-C 02/03/2018, 1:27 PM

## 2018-01-30 NOTE — Progress Notes (Signed)
CARDIAC REHAB PHASE I   PRE:  Rate/Rhythm: 85 afib  BP:  Supine:   Sitting: 164/76  Standing:    SaO2: 97%RA  MODE:  Ambulation: 190 ft   POST:  Rate/Rhythm: 108 afib  BP:  Supine:   Sitting: 150/62  Standing:    SaO2: 96%RA 1410-1442 Pt c/o lots of sternal pain. Sitting on side of bed getting ready to walk to bathroom with NTs.  Pt hesitant to walk. I had seen pt pre op and encouraged pt to take a walk after bathroom trip. Walked 190 ft on RA with gait belt use and rolling walker with very slow pace. Pt encouraged to take slow deep breaths. Assisted back to bed . RN to bring pain med.    Graylon Good, RN BSN  01/30/2018 2:36 PM

## 2018-01-30 NOTE — Progress Notes (Signed)
Progress Note  Patient Name: [name] Date of Encounter: 01/30/2018  Primary Cardiologist: [name], MD   Subjective   Sitting up in chair comfortable, no significant chest discomfort, no shortness of breath ambulating  Inpatient Medications    Scheduled Meds: . aspirin EC  325 mg Oral Daily  . atorvastatin  80 mg Oral q1800  . docusate sodium  200 mg Oral Daily  . [START ON 01/31/2018] furosemide  40 mg Oral Daily  . ketorolac  15 mg Intravenous Q6H  . mouth rinse  15 mL Mouth Rinse BID  . moving right along book   Does not apply Once  . pantoprazole  40 mg Oral QAC breakfast  . [START ON 01/31/2018] potassium chloride  20 mEq Oral Daily  . sodium chloride flush  3 mL Intravenous Q12H   Continuous Infusions: . sodium chloride     PRN Meds: sodium chloride, acetaminophen, bisacodyl **OR** bisacodyl, hydrocerin, ondansetron **OR** ondansetron (ZOFRAN) IV, oxyCODONE, sodium chloride flush, traMADol   Vital Signs    Vitals:   01/30/18 0500 01/30/18 0600 01/30/18 0700 01/30/18 0736  BP: (!) 105/59 (!) 150/72 104/64   Pulse: 64 66 66   Resp: 13 (!) 22 20   Temp:    98 F (36.7 C)  TempSrc:    Oral  SpO2: 95% 99% 93%   Weight:  105.9 kg    Height:        Intake/Output Summary (Last 24 hours) at 01/30/2018 0831 Last data filed at 01/30/2018 0700 Gross per 24 hour  Intake 826.13 ml  Output 2115 ml  Net -1288.87 ml   Filed Weights   01/28/18 0300 01/29/18 0500 01/30/18 0600  Weight: 101.7 kg 108 kg 105.9 kg    Telemetry    Atrial fibrillation under good rate control 70s to 80s- Personally Reviewed  ECG    No new- Personally Reviewed  Physical Exam   GEN: No acute distress.   Neck: No JVD Cardiac: RRR, no murmurs, rubs, or gallops.  CABG scar Respiratory: Clear to auscultation bilaterally. GI: Soft, nontender, non-distended  MS: No edema; No deformity. Neuro:  Nonfocal  Psych: Normal affect   Labs    Chemistry Recent Labs  Lab  01/28/18 0209  01/29/18 0302 01/29/18 1530 01/29/18 1553 01/30/18 0432  NA 136   < > 134*  --  135 134*  K 4.1   < > 4.5  --  4.4 4.3  CL 103   < > 106  --  99 101  CO2 24  --  22  --   --  26  GLUCOSE 96   < > 120*  --  138* 113*  BUN 21   < > 19  --  25* 27*  CREATININE 1.01   < > 0.79 1.03 1.00 0.86  CALCIUM 9.5  --  8.4*  --   --  8.5*  GFRNONAA >60   < > >60 >60  --  >60  GFRAA >60   < > >60 >60  --  >60  ANIONGAP 9  --  6  --   --  7   < > = values in this interval not displayed.     Hematology Recent Labs  Lab 01/29/18 0302 01/29/18 1530 01/29/18 1553 01/30/18 0432  WBC 11.3* 12.0*  --  11.3*  RBC 4.19* 4.14*  --  3.98*  HGB 13.5 13.4 13.6 12.3*  HCT 40.9 40.9 40.0 39.6  MCV 97.6 98.8  --  99.5  MCH 32.2 32.4  --  30.9  MCHC 33.0 32.8  --  31.1  RDW 12.6 12.6  --  12.8  PLT 111* 120*  --  108*    Cardiac EnzymesNo results for input(s): TROPONINI in the last 168 hours. No results for input(s): TROPIPOC in the last 168 hours.   BNPNo results for input(s): BNP, PROBNP in the last 168 hours.   DDimer No results for input(s): DDIMER in the last 168 hours.   Radiology    Dg Chest Port 1 View  Result Date: 01/29/2018 CLINICAL DATA:  Chest tube, post CABG EXAM: PORTABLE CHEST 1 VIEW COMPARISON:  01/28/2018 FINDINGS: Left chest tube and Swan-Ganz catheter remain in place, unchanged. Interval removal of endotracheal tube and NG tube. Cardiomegaly with vascular congestion and bibasilar atelectasis. Small left apical pneumothorax is stable. IMPRESSION: Interval extubation. Cardiomegaly with vascular congestion and increasing bibasilar atelectasis. Left chest tube remains in place with small residual left apical pneumothorax, stable. Electronically Signed   By: Rolm Baptise M.D.   On: 01/29/2018 08:24   Dg Chest Port 1 View  Result Date: 01/28/2018 CLINICAL DATA:  CABG x4, followup EXAM: PORTABLE CHEST 1 VIEW COMPARISON:  Portable chest x-ray of 01/21/2018 FINDINGS:  The endotracheal tube tip is approximately 6.2 cm above the carina. A left chest tube is present and there is a tiny left apical pneumothorax present. Swan-Ganz catheter tip is in the pulmonary outflow tract and a left atrial exclusion device is noted. NG tube extends below the hemidiaphragm. Minimal pulmonary vascular congestion cannot be excluded. No bony abnormality is seen IMPRESSION: 1. Tip of endotracheal tube 6.2 cm above the carina. 2. Cardiomegaly and possible mild pulmonary vascular congestion. 3. Left chest tube present with tiny left apical pneumothorax noted. Electronically Signed   By: Ivar Drape M.D.   On: 01/28/2018 13:31     Patient Profile     77 y.o. 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  When Eliquis is started, consider aspirin 81 mg -Overall good rate control.  CAD post CABG -Progressing well.  Lasix 40.      For questions or updates, please contact [place] Please consult [email or web address] for contact info under        Signed, [name], MD  01/30/2018, 8:31 AM

## 2018-01-30 NOTE — Progress Notes (Signed)
Pt arrived from [facility].  Report received from [place], [place]. Vital signs stable, pt oriented to unit and to room, call bell within reach, and bed in lowest position.

## 2018-01-31 LAB — POCT I-STAT, CHEM 8
BUN: 23 mg/dL (ref 8–23)
BUN: 19 mg/dL (ref 8–23)
BUN: 20 mg/dL (ref 8–23)
BUN: 19 mg/dL (ref 8–23)
BUN: 18 mg/dL (ref 8–23)
CALCIUM ION: 1.25 mmol/L (ref 1.15–1.40)
CALCIUM ION: 1.07 mmol/L — AB (ref 1.15–1.40)
CALCIUM ION: 1.14 mmol/L — AB (ref 1.15–1.40)
CALCIUM ION: 1.09 mmol/L — AB (ref 1.15–1.40)
CHLORIDE: 102 mmol/L (ref 98–111)
CREATININE: 0.6 mg/dL — AB (ref 0.61–1.24)
CREATININE: 0.5 mg/dL — AB (ref 0.61–1.24)
Calcium, Ion: 1.22 mmol/L (ref 1.15–1.40)
Chloride: 103 mmol/L (ref 98–111)
Chloride: 104 mmol/L (ref 98–111)
Chloride: 102 mmol/L (ref 98–111)
Chloride: 102 mmol/L (ref 98–111)
Creatinine, Ser: 0.6 mg/dL — ABNORMAL LOW (ref 0.61–1.24)
Creatinine, Ser: 0.7 mg/dL (ref 0.61–1.24)
Creatinine, Ser: 0.6 mg/dL — ABNORMAL LOW (ref 0.61–1.24)
GLUCOSE: 140 mg/dL — AB (ref 70–99)
GLUCOSE: 118 mg/dL — AB (ref 70–99)
GLUCOSE: 128 mg/dL — AB (ref 70–99)
Glucose, Bld: 111 mg/dL — ABNORMAL HIGH (ref 70–99)
Glucose, Bld: 123 mg/dL — ABNORMAL HIGH (ref 70–99)
HCT: 44 % (ref 39.0–52.0)
HCT: 32 % — ABNORMAL LOW (ref 39.0–52.0)
HCT: 41 % (ref 39.0–52.0)
HCT: 34 % — ABNORMAL LOW (ref 39.0–52.0)
HCT: 32 % — ABNORMAL LOW (ref 39.0–52.0)
HEMOGLOBIN: 15 g/dL (ref 13.0–17.0)
HEMOGLOBIN: 11.6 g/dL — AB (ref 13.0–17.0)
Hemoglobin: 10.9 g/dL — ABNORMAL LOW (ref 13.0–17.0)
Hemoglobin: 13.9 g/dL (ref 13.0–17.0)
Hemoglobin: 10.9 g/dL — ABNORMAL LOW (ref 13.0–17.0)
Potassium: 4.2 mmol/L (ref 3.5–5.1)
Potassium: 4.3 mmol/L (ref 3.5–5.1)
Potassium: 5.2 mmol/L — ABNORMAL HIGH (ref 3.5–5.1)
Potassium: 5.8 mmol/L — ABNORMAL HIGH (ref 3.5–5.1)
Potassium: 5 mmol/L (ref 3.5–5.1)
SODIUM: 136 mmol/L (ref 135–145)
SODIUM: 137 mmol/L (ref 135–145)
SODIUM: 136 mmol/L (ref 135–145)
Sodium: 137 mmol/L (ref 135–145)
Sodium: 134 mmol/L — ABNORMAL LOW (ref 135–145)
TCO2: 27 mmol/L (ref 22–32)
TCO2: 26 mmol/L (ref 22–32)
TCO2: 29 mmol/L (ref 22–32)
TCO2: 28 mmol/L (ref 22–32)
TCO2: 26 mmol/L (ref 22–32)

## 2018-01-31 LAB — POCT I-STAT 3, ART BLOOD GAS (G3+)
Acid-Base Excess: 2 mmol/L (ref 0.0–2.0)
Acid-Base Excess: 1 mmol/L (ref 0.0–2.0)
BICARBONATE: 25.6 mmol/L (ref 20.0–28.0)
Bicarbonate: 27.1 mmol/L (ref 20.0–28.0)
Bicarbonate: 25.4 mmol/L (ref 20.0–28.0)
O2 SAT: 100 %
O2 SAT: 100 %
O2 Saturation: 91 %
PCO2 ART: 45.8 mmHg (ref 32.0–48.0)
PCO2 ART: 39.4 mmHg (ref 32.0–48.0)
PH ART: 7.417 (ref 7.350–7.450)
PO2 ART: 434 mmHg — AB (ref 83.0–108.0)
TCO2: 27 mmol/L (ref 22–32)
TCO2: 28 mmol/L (ref 22–32)
TCO2: 27 mmol/L (ref 22–32)
pCO2 arterial: 44.9 mmHg (ref 32.0–48.0)
pH, Arterial: 7.363 (ref 7.350–7.450)
pH, Arterial: 7.38 (ref 7.350–7.450)
pO2, Arterial: 64 mmHg — ABNORMAL LOW (ref 83.0–108.0)
pO2, Arterial: 252 mmHg — ABNORMAL HIGH (ref 83.0–108.0)

## 2018-01-31 MED ORDER — ALPRAZOLAM 0.25 MG PO TABS
0.1250 mg | ORAL_TABLET | Freq: Two times a day (BID) | ORAL | Status: DC | PRN
  Administered 2018-01-31 – 2018-02-02 (×2): 0.125 mg via ORAL
  Filled 2018-01-31 (×3): qty 1

## 2018-01-31 MED ORDER — LISINOPRIL 5 MG PO TABS
5.0000 mg | ORAL_TABLET | Freq: Every day | ORAL | Status: DC
  Administered 2018-01-31 – 2018-02-01 (×2): 5 mg via ORAL
  Filled 2018-01-31 (×2): qty 1

## 2018-01-31 NOTE — Progress Notes (Signed)
CARDIAC REHAB PHASE I   PRE:  Rate/Rhythm: 74 afib  BP:  Supine:   Sitting: 120/67  Standing:    SaO2: 95%RA  MODE:  Ambulation: 340 ft   POST:  Rate/Rhythm: 103 afib  BP:  Supine:   Sitting: 149/83  Standing:    SaO2: 96%RA [id number] Discussed with RN monitoring pt's response to pain med as tramadol did not seem to be controlling pain yesterday and pt reluctant to walk. Today pt is less anxious, taking bigger steps and was able to go farther. Walked 340 ft on RA with rolling walker with slow steady gait. To recliner after walk. Positive reinforcement and encouragement given for distance increased.   Graylon Good, RN BSN  01/31/2018 11:17 AM

## 2018-01-31 NOTE — Progress Notes (Signed)
Pt ambulated in hall about 200 ft. With front wheel walker on room air. Pt did not want to get up and walk due to surgical pain to the midsternum but with some encouragement he was able to push himself to get a walk in this morning. Patient back in bed and given pain medication. Overall pt tolerated well.

## 2018-01-31 NOTE — Progress Notes (Addendum)
Progress Note  Patient Name: [name] Date of Encounter: 01/31/2018  Primary Cardiologist: [name], MD  Subjective   Good night overall. No cardiac complaints this AM. Progressing nicely. Sitting up in bed at window today.  Inpatient Medications    Scheduled Meds: . aspirin EC  325 mg Oral Daily  . atorvastatin  80 mg Oral q1800  . docusate sodium  200 mg Oral Daily  . furosemide  40 mg Oral Daily  . lisinopril  5 mg Oral Daily  . mouth rinse  15 mL Mouth Rinse BID  . pantoprazole  40 mg Oral QAC breakfast  . potassium chloride  20 mEq Oral Daily  . sodium chloride flush  3 mL Intravenous Q12H   Continuous Infusions: . sodium chloride     PRN Meds: sodium chloride, acetaminophen, ALPRAZolam, bisacodyl **OR** bisacodyl, hydrocerin, ondansetron **OR** ondansetron (ZOFRAN) IV, oxyCODONE, sodium chloride flush, traMADol   Vital Signs    Vitals:   01/31/18 0435 01/31/18 0444 01/31/18 0833 01/31/18 0900  BP: (!) 154/85  (!) 142/87   Pulse: 71  (!) 25   Resp: 12 (!) 29 18 (!) 25  Temp: 98.7 F (37.1 C)     TempSrc: Oral     SpO2: 97%  94%   Weight:  105.9 kg    Height:        Intake/Output Summary (Last 24 hours) at 01/31/2018 1145 Last data filed at 01/31/2018 0900 Gross per 24 hour  Intake 440 ml  Output 1250 ml  Net -810 ml   Filed Weights   01/29/18 0500 01/30/18 0600 01/31/18 0444  Weight: 108 kg 105.9 kg 105.9 kg    Telemetry    Atrial fib CVR - Personally Reviewed   Physical Exam   Examined by Dr. Marlou Porch GEN: No acute distress.  HEENT: Normocephalic, atraumatic, sclera non-icteric. Neck: No JVD or bruits. Cardiac: Irregularly irregular, rate controlled, no murmurs, rubs, or gallops.  Radials/DP/PT 1+ and equal bilaterally.  Respiratory: Clear to auscultation bilaterally. Breathing is unlabored. GI: Soft, nontender, non-distended, BS +x 4. MS: no deformity.  Extremities: No clubbing or cyanosis. No edema. Distal pedal pulses  are 2+ and equal bilaterally. Neuro:  AAOx3. Follows commands. Psych:  Responds to questions appropriately with a normal affect.  Labs    Chemistry Recent Labs  Lab 01/28/18 0209  01/29/18 0302 01/29/18 1530 01/29/18 1553 01/30/18 0432  NA 136   < > 134*  --  135 134*  K 4.1   < > 4.5  --  4.4 4.3  CL 103   < > 106  --  99 101  CO2 24  --  22  --   --  26  GLUCOSE 96   < > 120*  --  138* 113*  BUN 21   < > 19  --  25* 27*  CREATININE 1.01   < > 0.79 1.03 1.00 0.86  CALCIUM 9.5  --  8.4*  --   --  8.5*  GFRNONAA >60   < > >60 >60  --  >60  GFRAA >60   < > >60 >60  --  >60  ANIONGAP 9  --  6  --   --  7   < > = values in this interval not displayed.     Hematology Recent Labs  Lab 01/29/18 0302 01/29/18 1530 01/29/18 1553 01/30/18 0432  WBC 11.3* 12.0*  --  11.3*  RBC 4.19* 4.14*  --  3.98*  HGB 13.5 13.4 13.6 12.3*  HCT 40.9 40.9 40.0 39.6  MCV 97.6 98.8  --  99.5  MCH 32.2 32.4  --  30.9  MCHC 33.0 32.8  --  31.1  RDW 12.6 12.6  --  12.8  PLT 111* 120*  --  108*    Radiology    Dg Chest Port 1 View  Result Date: 01/30/2018 CLINICAL DATA:  CABG EXAM: PORTABLE CHEST 1 VIEW COMPARISON:  01/29/2018 FINDINGS: Interval removal of left chest tube and Swan-Ganz catheter. No visible residual pneumothorax. Cardiomegaly with vascular congestion and bibasilar atelectasis. Improved aeration in the lung bases since prior study. IMPRESSION: Interval removal of left chest tube without visible pneumothorax. Improving aeration with decreasing bibasilar atelectasis. Continued cardiomegaly and mild vascular congestion. Electronically Signed   By: Rolm Baptise M.D.   On: 01/30/2018 08:31    Patient Profile     [name], HTN, permanent AF with slow VR on apixiban, colon cancer s/p R colectomy and chemotherapy now in remission presented with NSTEMI and found to have multivessel disease with normal LVEF. Underwent CABG 01/28/18 with LIMA-LAD,  SVG-OM1, seq SVG to PDA and PL.  Assessment & Plan    1. NSTEMI s/p CABG - progressing nicely. Suggestions have been made to decrease ASA to 81mg  upon resumption of Eliquis when felt safe by surgical team. Per review with Dr. Marlou Porch, would not plan to use Plavix at this time for NSTEMI given need for concomitant Eliquis and risk of bleeding.   2. HTN - BP mildly elevated at times -> lisinopril re-added today by CVTS team.  3. Hyperlipidemia - Zocor changed to atorvastatin this admission. LFTs were mildly abnormal on admission. Will add on repeat for AM labs for trending. If these are stable and patient is tolerating statin at time of follow-up appointment, would consider rechecking liver function/lipid panel in 6-8 weeks.  4. Permanent atrial fib with history of SVR - no BB listed on MAR prior to admission but patient reports previously being on a quarter tablet of BB. HR well controlled here so will hold off. Resume Eliquis per surgery when felt safe.  CARDIOLOGY RECOMMENDATIONS:  Discharge is anticipated in the next 48 hours per surgery note. Recommendations for medications and follow up:  Discharge Medications: see recommendations above regarding aspirin/Eliquis  Follow Up: The patient's Primary Cardiologist is [name], MD  Follow up in our office within in 2 week(s). TOC appointment already arranged 02/11/18 with [name].  We will otherwise plan to sign off. Please call with any questions.  For questions or updates, please contact [place] Please consult [email or web address] for contact info under Cardiology/STEMI.  Signed, Charlie Pitter, Keller 01/31/2018, 11:45 AM    Personally seen and examined. Agree with above.  Doing well. Close to going home.  RRR, scar well healing, lungs clear  AFIB Perm  - good rate control without metoprolol (Had slow HR periop)  - Start Eliquis 5 BID at DC.   - Reduce ASA to 81 at DC  CAD with CABG  - stable. Improving.  See  appts above.   [name], MD   [name], MD

## 2018-01-31 NOTE — Progress Notes (Addendum)
[address]       [place], [address]             [phone]      3 Days Post-Op Procedure(s) (LRB): CORONARY ARTERY BYPASS GRAFTING (CABG) X 4 USING LEFT INTERNAL MAMMARY ARTERY AND LEFT GREATER SAPHENOUS VEIN HARVESTED ENDOSCOPICALLY (N/A) TRANSESOPHAGEAL ECHOCARDIOGRAM (TEE) (N/A) CLIPPING OF ATRIAL APPENDAGE Subjective: Feels weak and sore  Objective: Vital signs in last 24 hours: Temp:  [97.9 F (36.6 C)-98.7 F (37.1 C)] 98.7 F (37.1 C) (11/15 0435) Pulse Rate:  [25-103] 25 (11/15 0833) Cardiac Rhythm: Atrial fibrillation (11/15 0700) Resp:  [12-29] 18 (11/15 0833) BP: (126-154)/(47-87) 142/87 (11/15 0833) SpO2:  [94 %-97 %] 94 % (11/15 0833) Weight:  [105.9 kg] 105.9 kg (11/15 0444)  Hemodynamic parameters for last 24 hours:    Intake/Output from previous day: 11/14 0701 - 11/15 0700 In: 660.1 [P.O.:560; IV Piggyback:100.1] Out: 1150 [Urine:1150] Intake/Output this shift: Total I/O In: -  Out: 150 [Urine:150]  General appearance: alert, cooperative and no distress Heart: irregularly irregular rhythm Lungs: clear to auscultation bilaterally Abdomen: soft, nontender Extremities: minor edema Wound: dressings intact  Lab Results: Recent Labs    01/29/18 1530 01/29/18 1553 01/30/18 0432  WBC 12.0*  --  11.3*  HGB 13.4 13.6 12.3*  HCT 40.9 40.0 39.6  PLT 120*  --  108*   BMET:  Recent Labs    01/29/18 0302  01/29/18 1553 01/30/18 0432  NA 134*  --  135 134*  K 4.5  --  4.4 4.3  CL 106  --  99 101  CO2 22  --   --  26  GLUCOSE 120*  --  138* 113*  BUN 19  --  25* 27*  CREATININE 0.79   < > 1.00 0.86  CALCIUM 8.4*  --   --  8.5*   < > = values in this interval not displayed.    PT/INR:  Recent Labs    01/28/18 1308  LABPROT 16.3*  INR 1.33   ABG    Component Value Date/Time   PHART 7.347 (L) 01/28/2018 1835   HCO3 24.3 01/28/2018 1835   TCO2 27 01/29/2018 1553   ACIDBASEDEF 2.0 01/28/2018 1835   O2SAT 99.0  01/28/2018 1835   CBG (last 3)  Recent Labs    01/29/18 1546 01/29/18 2111 01/30/18 0606  GLUCAP 127* 138* 97    Meds Scheduled Meds: . aspirin EC  325 mg Oral Daily  . atorvastatin  80 mg Oral q1800  . docusate sodium  200 mg Oral Daily  . furosemide  40 mg Oral Daily  . ketorolac  15 mg Intravenous Q6H  . mouth rinse  15 mL Mouth Rinse BID  . pantoprazole  40 mg Oral QAC breakfast  . potassium chloride  20 mEq Oral Daily  . sodium chloride flush  3 mL Intravenous Q12H   Continuous Infusions: . sodium chloride     PRN Meds:.sodium chloride, acetaminophen, bisacodyl **OR** bisacodyl, hydrocerin, ondansetron **OR** ondansetron (ZOFRAN) IV, oxyCODONE, sodium chloride flush, traMADol  Xrays Dg Chest Port 1 View  Result Date: 01/30/2018 CLINICAL DATA:  CABG EXAM: PORTABLE CHEST 1 VIEW COMPARISON:  01/29/2018 FINDINGS: Interval removal of left chest tube and Swan-Ganz catheter. No visible residual pneumothorax. Cardiomegaly with vascular congestion and bibasilar atelectasis. Improved aeration in the lung bases since prior study. IMPRESSION: Interval removal of left chest tube without visible pneumothorax. Improving aeration with decreasing bibasilar atelectasis. Continued  cardiomegaly and mild vascular congestion. Electronically Signed   By: Rolm Baptise M.D.   On: 01/30/2018 08:31    Assessment/Plan: S/P Procedure(s) (LRB): CORONARY ARTERY BYPASS GRAFTING (CABG) X 4 USING LEFT INTERNAL MAMMARY ARTERY AND LEFT GREATER SAPHENOUS VEIN HARVESTED ENDOSCOPICALLY (N/A) TRANSESOPHAGEAL ECHOCARDIOGRAM (TEE) (N/A) CLIPPING OF ATRIAL APPENDAGE  1 doing well POD#3 2 chronic afib with CVR on current RX, plans noted for eliquis and low dose ASA when wires out, d/c in am if no further changes 3 hemodyn stable, some HTN, with normal creat/GFR will start low dose ACE-I. Cont gentle diuresis. Will stop toradol at this point 4 push pulm toilet and rehab per normal routine 5 sugars controlled,  HG A1C 5.3 preop 6 repeat labs in am  LOS: 10 days    [name] [facility] 01/31/2018 Pager [phone]   Chart reviewed, patient examined, agree with above. Weight is still 9 lbs over preop. Continue diuresis.  Can DC pacing wires in am and resume Eliquis the following day. Plan home Sunday probably.

## 2018-02-01 LAB — CBC
HCT: 40.5 % (ref 39.0–52.0)
HEMOGLOBIN: 12.9 g/dL — AB (ref 13.0–17.0)
MCH: 31.3 pg (ref 26.0–34.0)
MCHC: 31.9 g/dL (ref 30.0–36.0)
MCV: 98.3 fL (ref 80.0–100.0)
NRBC: 0 % (ref 0.0–0.2)
Platelets: 207 10*3/uL (ref 150–400)
RBC: 4.12 MIL/uL — ABNORMAL LOW (ref 4.22–5.81)
RDW: 12.7 % (ref 11.5–15.5)
WBC: 11.6 10*3/uL — AB (ref 4.0–10.5)

## 2018-02-01 LAB — BASIC METABOLIC PANEL
Anion gap: 10 (ref 5–15)
BUN: 21 mg/dL (ref 8–23)
CO2: 26 mmol/L (ref 22–32)
Calcium: 9 mg/dL (ref 8.9–10.3)
Chloride: 98 mmol/L (ref 98–111)
Creatinine, Ser: 0.82 mg/dL (ref 0.61–1.24)
Glucose, Bld: 108 mg/dL — ABNORMAL HIGH (ref 70–99)
POTASSIUM: 4.1 mmol/L (ref 3.5–5.1)
Sodium: 134 mmol/L — ABNORMAL LOW (ref 135–145)

## 2018-02-01 LAB — HEPATIC FUNCTION PANEL
ALK PHOS: 46 U/L (ref 38–126)
ALT: 49 U/L — AB (ref 0–44)
AST: 45 U/L — ABNORMAL HIGH (ref 15–41)
Albumin: 3.6 g/dL (ref 3.5–5.0)
BILIRUBIN INDIRECT: 1.5 mg/dL — AB (ref 0.3–0.9)
Bilirubin, Direct: 0.4 mg/dL — ABNORMAL HIGH (ref 0.0–0.2)
TOTAL PROTEIN: 6.4 g/dL — AB (ref 6.5–8.1)
Total Bilirubin: 1.9 mg/dL — ABNORMAL HIGH (ref 0.3–1.2)

## 2018-02-01 MED ORDER — FLEET ENEMA 7-19 GM/118ML RE ENEM
1.0000 | ENEMA | Freq: Once | RECTAL | Status: AC
  Administered 2018-02-01: 1 via RECTAL
  Filled 2018-02-01: qty 1

## 2018-02-01 MED ORDER — POLYETHYLENE GLYCOL 3350 17 G PO PACK
17.0000 g | PACK | Freq: Every day | ORAL | Status: DC
  Administered 2018-02-01 – 2018-02-03 (×3): 17 g via ORAL
  Filled 2018-02-01 (×3): qty 1

## 2018-02-01 MED ORDER — BISACODYL 10 MG RE SUPP
10.0000 mg | Freq: Once | RECTAL | Status: AC
  Administered 2018-02-01: 10 mg via RECTAL

## 2018-02-01 MED ORDER — LACTULOSE 10 GM/15ML PO SOLN
10.0000 g | Freq: Every day | ORAL | Status: DC | PRN
  Administered 2018-02-01: 10 g via ORAL
  Filled 2018-02-01: qty 15

## 2018-02-01 MED ORDER — APIXABAN 2.5 MG PO TABS
2.5000 mg | ORAL_TABLET | Freq: Two times a day (BID) | ORAL | Status: DC
  Administered 2018-02-01: 2.5 mg via ORAL
  Filled 2018-02-01: qty 1

## 2018-02-01 MED ORDER — ASPIRIN EC 81 MG PO TBEC
81.0000 mg | DELAYED_RELEASE_TABLET | Freq: Every day | ORAL | Status: DC
  Administered 2018-02-02 – 2018-02-03 (×2): 81 mg via ORAL
  Filled 2018-02-01 (×2): qty 1

## 2018-02-01 MED ORDER — LISINOPRIL 10 MG PO TABS
10.0000 mg | ORAL_TABLET | Freq: Every day | ORAL | Status: DC
  Administered 2018-02-02: 10 mg via ORAL
  Filled 2018-02-01: qty 1

## 2018-02-01 NOTE — Progress Notes (Addendum)
      [address]       [place] [address]             [phone]      4 Days Post-Op Procedure(s) (LRB): CORONARY ARTERY BYPASS GRAFTING (CABG) X 4 USING LEFT INTERNAL MAMMARY ARTERY AND LEFT GREATER SAPHENOUS VEIN HARVESTED ENDOSCOPICALLY (N/A) TRANSESOPHAGEAL ECHOCARDIOGRAM (TEE) (N/A) CLIPPING OF ATRIAL APPENDAGE Subjective: Constipation this morning which is very uncomfortable. He has been on the commode since 3am trying to have a bowel movement  Objective: Vital signs in last 24 hours: Temp:  [98.3 F (36.8 C)-99.3 F (37.4 C)] 98.3 F (36.8 C) (11/16 0810) Pulse Rate:  [19-85] 76 (11/16 0810) Cardiac Rhythm: Atrial fibrillation;Bundle branch block (11/15 2000) Resp:  [16-33] 22 (11/16 0810) BP: (118-171)/(68-83) 162/79 (11/16 0903) SpO2:  [88 %-99 %] 99 % (11/16 0810) Weight:  [105.1 kg] 105.1 kg (11/16 0202)      Intake/Output from previous day: 11/15 0701 - 11/16 0700 In: 963 [P.O.:960; I.V.:3] Out: 1971 [WNUUV:2536; Stool:1] Intake/Output this shift: No intake/output data recorded.  General appearance: mild distress Heart:  NSR Abdomen: no tenderness Extremities: extremities normal, atraumatic, no cyanosis or edema Wound: clean and dry **physical exam limited by patient being on commode   Lab Results: Recent Labs    01/30/18 0432 02/01/18 0316  WBC 11.3* 11.6*  HGB 12.3* 12.9*  HCT 39.6 40.5  PLT 108* 207   BMET:  Recent Labs    01/30/18 0432 02/01/18 0316  NA 134* 134*  K 4.3 4.1  CL 101 98  CO2 26 26  GLUCOSE 113* 108*  BUN 27* 21  CREATININE 0.86 0.82  CALCIUM 8.5* 9.0    PT/INR: No results for input(s): LABPROT, INR in the last 72 hours. ABG    Component Value Date/Time   PHART 7.347 (L) 01/28/2018 1835   HCO3 24.3 01/28/2018 1835   TCO2 27 01/29/2018 1553   ACIDBASEDEF 2.0 01/28/2018 1835   O2SAT 99.0 01/28/2018 1835   CBG (last 3)  Recent Labs    01/29/18 1546 01/29/18 2111 01/30/18 0606  GLUCAP  127* 138* 97    Assessment/Plan: S/P Procedure(s) (LRB): CORONARY ARTERY BYPASS GRAFTING (CABG) X 4 USING LEFT INTERNAL MAMMARY ARTERY AND LEFT GREATER SAPHENOUS VEIN HARVESTED ENDOSCOPICALLY (N/A) TRANSESOPHAGEAL ECHOCARDIOGRAM (TEE) (N/A) CLIPPING OF ATRIAL APPENDAGE  1. CV-chronic afib with RVR. Remains hypertensive-will increase ACEI. Wires out today and will start eliquis and 81mg  ASA. Atrial appendage clipped. No BB at this time per cardiology. Continue statin therapy 2. Pulm-tolerating room air with good oxygen saturation. No recent CXR. Encouraged incentive spirometer.  3. Renal-creatinine 0.82, electrolytes okay. Continue oral daily lasix for fluid overload 4. H and H is stable 12.9/40.5, expected acute blood loss anemia 5. Blood glucose well controlled on current regimen  Plan: Increase lisinopril for better BP control. Intermittent atrial fibrillation but this is chronic, rate-controlled. Discontinue EPW today. Start Eliquis tonight. Suppository for constipation and miralax ordered.    LOS: 11 days    Elgie Collard 02/01/2018  Trouble with bowel activity, impacted  Chronic a fib  I have seen and examined [name] and agree with the above assessment  and plan.  Grace Isaac MD Beeper [phone] Office [phone] 02/01/2018 12:42 PM

## 2018-02-01 NOTE — Progress Notes (Signed)
Patient ambulated three times in the hall on this shift using a walker, ambulation well tolerated.

## 2018-02-01 NOTE — Progress Notes (Signed)
0817- Patient declined to walk at this time stating he is sore right now and that he walked earlier this morning. Will f/u for ambulation and education. Sol Passer, MS, ACSM CEP

## 2018-02-01 NOTE — Progress Notes (Addendum)
Pt walked about 520 steps room air standby assist with front wheel walker. Pt distance and speed with walking has progressed since 01/31/2018. Pt tolerated well other than feeling a little short of breath. Pt back in room sitting in chair.

## 2018-02-01 NOTE — Progress Notes (Signed)
[phone] Pt just returned from ambulating with the nursing staff. [facility] discharge education completed with patient and patient's wife including IS use, sternal precautions, restrictions, risk factors, heart healthy eating and activity progression, pt verbalizes understanding of information given.. Showed patient and wife how to cue up the recovering from [facility] video on the patient education network. Discussed phase 2 cardiac rehab, and patient is interested in the program at [facility], referral sent. Sol Passer, MS, ACSM CEP

## 2018-02-02 MED ORDER — GUAIFENESIN ER 600 MG PO TB12
1200.0000 mg | ORAL_TABLET | Freq: Two times a day (BID) | ORAL | Status: DC
  Administered 2018-02-02 – 2018-02-03 (×3): 1200 mg via ORAL
  Filled 2018-02-02 (×3): qty 2

## 2018-02-02 MED ORDER — APIXABAN 2.5 MG PO TABS
2.5000 mg | ORAL_TABLET | Freq: Two times a day (BID) | ORAL | Status: DC
  Administered 2018-02-03: 2.5 mg via ORAL
  Filled 2018-02-02: qty 1

## 2018-02-02 NOTE — Progress Notes (Addendum)
      [address]       [place] [address]             [phone]      5 Days Post-Op Procedure(s) (LRB): CORONARY ARTERY BYPASS GRAFTING (CABG) X 4 USING LEFT INTERNAL MAMMARY ARTERY AND LEFT GREATER SAPHENOUS VEIN HARVESTED ENDOSCOPICALLY (N/A) TRANSESOPHAGEAL ECHOCARDIOGRAM (TEE) (N/A) CLIPPING OF ATRIAL APPENDAGE Subjective: He does not feel well this morning. Did finally have a bowel movement but he is fatigued and short of breath when he ambulates.   Objective: Vital signs in last 24 hours: Temp:  [97.9 F (36.6 C)-98.6 F (37 C)] 98.4 F (36.9 C) (11/17 0726) Pulse Rate:  [64-83] 74 (11/17 0726) Cardiac Rhythm: Atrial fibrillation (11/17 0450) Resp:  [19-33] 19 (11/17 0726) BP: (133-171)/(65-83) 133/71 (11/17 0726) SpO2:  [88 %-99 %] 99 % (11/17 0726) Weight:  [104.4 kg] 104.4 kg (11/17 0450)     Intake/Output from previous day: 11/16 0701 - 11/17 0700 In: 480 [P.O.:480] Out: 1725 [Urine:1725] Intake/Output this shift: Total I/O In: -  Out: 200 [Urine:200]  General appearance: alert, cooperative and no distress Heart: regular rate and rhythm, S1, S2 normal, no murmur, click, rub or gallop Lungs: clear to auscultation bilaterally Abdomen: soft, non-tender; bowel sounds normal; no masses,  no organomegaly Extremities: extremities normal, atraumatic, no cyanosis or edema Wound: clean and dry  Lab Results: Recent Labs    02/01/18 0316  WBC 11.6*  HGB 12.9*  HCT 40.5  PLT 207   BMET:  Recent Labs    02/01/18 0316  NA 134*  K 4.1  CL 98  CO2 26  GLUCOSE 108*  BUN 21  CREATININE 0.82  CALCIUM 9.0    PT/INR: No results for input(s): LABPROT, INR in the last 72 hours. ABG    Component Value Date/Time   PHART 7.347 (L) 01/28/2018 1835   HCO3 24.3 01/28/2018 1835   TCO2 27 01/29/2018 1553   ACIDBASEDEF 2.0 01/28/2018 1835   O2SAT 99.0 01/28/2018 1835   CBG (last 3)  No results for input(s): GLUCAP in the last 72  hours.  Assessment/Plan: S/P Procedure(s) (LRB): CORONARY ARTERY BYPASS GRAFTING (CABG) X 4 USING LEFT INTERNAL MAMMARY ARTERY AND LEFT GREATER SAPHENOUS VEIN HARVESTED ENDOSCOPICALLY (N/A) TRANSESOPHAGEAL ECHOCARDIOGRAM (TEE) (N/A) CLIPPING OF ATRIAL APPENDAGE   1. CV-chronic afib with RVR. HTN improved with increase in ACEI.  Atrial appendage clipped. No BB at this time per cardiology. Continue statin therapy 2. Pulm-tolerating room air with good oxygen saturation. No recent CXR. Encouraged incentive spirometer.  3. Renal-creatinine 0.82, electrolytes okay. Continue oral daily lasix for fluid overload 4. H and H is stable 12.9/40.5, expected acute blood loss anemia 5. Blood glucose well controlled on current regimen  Plan: Wires were not pulled yesterday due to bowel issues and a dose of Eliquis was given. Eliquis discontinued for now-will discuss with Dr. Servando Snare. Keep another day to work on ambulation and incentive spirometer. He is feeling anxious-PRN xanax already ordered.    LOS: 12 days    [name] 02/02/2018  Got bowels working yesterday  Poss home tomorrow Wires out later today and resume anticogulation tomorrow before d/c I have seen and examined [name] and agree with the above assessment  and plan.  Grace Isaac MD Beeper [phone] Office [phone] 02/02/2018 12:52 PM

## 2018-02-02 NOTE — Progress Notes (Signed)
Patient education completed for removal of pacer wires, patient verbalized understanding, pacer wires removed as ordered, procedure well tolerated, patient now on bedrest , call bell within reach will monitor.

## 2018-02-02 NOTE — Progress Notes (Signed)
Pt ambulated 470 feet in hall with walker pt tolerated well

## 2018-02-02 NOTE — Discharge Instructions (Addendum)
Coronary Artery Bypass Grafting, Care After ° °This sheet gives you information about how to care for yourself after your procedure. Your health care provider may also give you more specific instructions. If you have problems or questions, contact your health care provider. °What can I expect after the procedure? °After the procedure, it is common to have: °· Nausea and a lack of appetite. °· Constipation. °· Weakness and fatigue. °· Depression or irritability. °· Pain or discomfort in your incision areas. ° °Follow these instructions at home: °Medicines °· Take over-the-counter and prescription medicines only as told by your health care provider. Do not stop taking medicines or start any new medicines without approval from your health care provider. °· If you were prescribed an antibiotic medicine, take it as told by your health care provider. Do not stop taking the antibiotic even if you start to feel better. °· Do not drive or use heavy machinery while taking prescription pain medicine. °Incision care °· Follow instructions from your health care provider about how to take care of your incisions. Make sure you: °? Wash your hands with soap and water before you change your bandage (dressing). If soap and water are not available, use hand sanitizer. °? Change your dressing as told by your health care provider. °? Leave stitches (sutures), skin glue, or adhesive strips in place. These skin closures may need to stay in place for 2 weeks or longer. If adhesive strip edges start to loosen and curl up, you may trim the loose edges. Do not remove adhesive strips completely unless your health care provider tells you to do that. °· Keep incision areas clean, dry, and protected. °· Check your incision areas every day for signs of infection. Check for: °? More redness, swelling, or pain. °? More fluid or blood. °? Warmth. °? Pus or a bad smell. °· If incisions were made in your legs: °? Avoid crossing your legs. °? Avoid  sitting for long periods of time. Change positions every 30 minutes. °? Raise (elevate) your legs when you are sitting. °Bathing °· Do not take baths, swim, or use a hot tub until your health care provider approves. °· Only take sponge baths. Pat the incisions dry. Do not rub incisions with a washcloth or towel. °· Ask your health care provider when you can shower. °Eating and drinking °· Eat foods that are high in fiber, such as raw fruits and vegetables, whole grains, beans, and nuts. Meats should be lean cut. Avoid canned, processed, and fried foods. This can help prevent constipation and is a recommended part of a heart-healthy diet. °· Drink enough fluid to keep your urine clear or pale yellow. °· Limit alcohol intake to no more than 1 drink a day for nonpregnant women and 2 drinks a day for men. One drink equals 12 oz of beer, 5 oz of wine, or 1½ oz of hard liquor. °Activity °· Rest and limit your activity as told by your health care provider. You may be instructed to: °? Stop any activity right away if you have chest pain, shortness of breath, irregular heartbeats, or dizziness. Get help right away if you have any of these symptoms. °? Move around frequently for short periods or take short walks as directed by your health care provider. Gradually increase your activities. You may need physical therapy or cardiac rehabilitation to help strengthen your muscles and build your endurance. °? Avoid lifting, pushing, or pulling anything that is heavier than 10 lb (4.5 kg) for   at least 6 weeks or as told by your health care provider.  Do not drive until your health care provider approves.  Ask your health care provider when you may return to work.  Ask your health care provider when you may resume sexual activity. General instructions  Do not use any products that contain nicotine or tobacco, such as cigarettes and e-cigarettes. If you need help quitting, ask your health care provider.  Take 2-3 deep  breaths every few hours during the day, while you recover. This helps expand your lungs and prevent complications like pneumonia after surgery.  If you were given a device called an incentive spirometer, use it several times a day to practice deep breathing. Support your chest with a pillow or your arms when you take deep breaths or cough.  Wear compression stockings as told by your health care provider. These stockings help to prevent blood clots and reduce swelling in your legs.  Weigh yourself every day. This helps identify if your body is holding (retaining) fluid that may make your heart and lungs work harder.  Keep all follow-up visits as told by your health care provider. This is important. Contact a health care provider if:  You have more redness, swelling, or pain around any incision.  You have more fluid or blood coming from any incision.  Any incision feels warm to the touch.  You have pus or a bad smell coming from any incision  You have a fever.  You have swelling in your ankles or legs.  You have pain in your legs.  You gain 2 lb (0.9 kg) or more a day.  You are nauseous or you vomit.  You have diarrhea. Get help right away if:  You have chest pain that spreads to your jaw or arms.  You are short of breath.  You have a fast or irregular heartbeat.  You notice a "clicking" in your breastbone (sternum) when you move.  You have numbness or weakness in your arms or legs.  You feel dizzy or light-headed. Summary  After the procedure, it is common to have pain or discomfort in the incision areas.  Do not take baths, swim, or use a hot tub until your health care provider approves.  Gradually increase your activities. You may need physical therapy or cardiac rehabilitation to help strengthen your muscles and build your endurance.  Weigh yourself every day. This helps identify if your body is holding (retaining) fluid that may make your heart and lungs work  harder. This information is not intended to replace advice given to you by your health care provider. Make sure you discuss any questions you have with your health care provider. Document Released: 09/22/2004 Document Revised: 01/23/2016 Document Reviewed: 01/23/2016 Elsevier Interactive Patient Education  2018 [place] on my medicine - ELIQUIS (apixaban)  Why was Eliquis prescribed for you? Eliquis was prescribed for you to reduce the risk of a blood clot forming that can cause a stroke if you have a medical condition called atrial fibrillation (a type of irregular heartbeat).  What do You need to know about Eliquis ? Take your Eliquis TWICE DAILY - one tablet in the morning and one tablet in the evening with or without food. If you have difficulty swallowing the tablet whole please discuss with your pharmacist how to take the medication safely.  Take Eliquis exactly as prescribed by your doctor and DO NOT stop taking Eliquis without talking to the  doctor who prescribed the medication.  Stopping may increase your risk of developing a stroke.  Refill your prescription before you run out.  After discharge, you should have regular check-up appointments with your healthcare provider that is prescribing your Eliquis.  In the future your dose may need to be changed if your kidney function or weight changes by a significant amount or as you get older.  What do you do if you miss a dose? If you miss a dose, take it as soon as you remember on the same day and resume taking twice daily.  Do not take more than one dose of ELIQUIS at the same time to make up a missed dose.  Important Safety Information A possible side effect of Eliquis is bleeding. You should call your healthcare provider right away if you experience any of the following: ? Bleeding from an injury or your nose that does not stop. ? Unusual colored urine (red or dark brown) or unusual colored stools  (red or black). ? Unusual bruising for unknown reasons. ? A serious fall or if you hit your head (even if there is no bleeding).  Some medicines may interact with Eliquis and might increase your risk of bleeding or clotting while on Eliquis. To help avoid this, consult your healthcare provider or pharmacist prior to using any new prescription or non-prescription medications, including herbals, vitamins, non-steroidal anti-inflammatory drugs (NSAIDs) and supplements.  This website has more information on Eliquis (apixaban): http://www.eliquis.com/eliquis/home

## 2018-02-03 MED ORDER — FUROSEMIDE 40 MG PO TABS
40.0000 mg | ORAL_TABLET | Freq: Every day | ORAL | Status: DC
  Administered 2018-02-03: 40 mg via ORAL
  Filled 2018-02-03: qty 1

## 2018-02-03 MED ORDER — APIXABAN 5 MG PO TABS: 5.0000 mg | ORAL_TABLET | Freq: Two times a day (BID) | ORAL | Status: DC

## 2018-02-03 MED ORDER — ATORVASTATIN CALCIUM 80 MG PO TABS: 80.0000 mg | ORAL_TABLET | Freq: Every day | ORAL | 1 refills | Status: DC

## 2018-02-03 MED ORDER — APIXABAN 2.5 MG PO TABS: 2.5000 mg | ORAL_TABLET | Freq: Two times a day (BID) | ORAL | 1 refills | Status: DC

## 2018-02-03 MED ORDER — ACETAMINOPHEN 325 MG PO TABS: 650.0000 mg | ORAL_TABLET | Freq: Four times a day (QID) | ORAL | Status: DC | PRN

## 2018-02-03 MED ORDER — TRAMADOL HCL 50 MG PO TABS: ORAL_TABLET | ORAL | 0 refills | Status: DC

## 2018-02-03 MED ORDER — POTASSIUM CHLORIDE CRYS ER 20 MEQ PO TBCR: 20.0000 meq | EXTENDED_RELEASE_TABLET | Freq: Every day | ORAL | 0 refills | Status: DC

## 2018-02-03 MED ORDER — LISINOPRIL 10 MG PO TABS
20.0000 mg | ORAL_TABLET | Freq: Every day | ORAL | Status: DC
  Administered 2018-02-03: 20 mg via ORAL
  Filled 2018-02-03: qty 2

## 2018-02-03 MED ORDER — POTASSIUM CHLORIDE CRYS ER 20 MEQ PO TBCR
20.0000 meq | EXTENDED_RELEASE_TABLET | Freq: Every day | ORAL | Status: DC
  Administered 2018-02-03: 20 meq via ORAL
  Filled 2018-02-03: qty 1

## 2018-02-03 MED ORDER — FUROSEMIDE 40 MG PO TABS: 40.0000 mg | ORAL_TABLET | Freq: Every day | ORAL | 0 refills | Status: DC

## 2018-02-03 MED ORDER — LISINOPRIL 20 MG PO TABS: 20.0000 mg | ORAL_TABLET | Freq: Every day | ORAL | 1 refills | Status: DC

## 2018-02-03 MED FILL — traMADol HCL 50 MG TABS: 50 | 5 days supply | Qty: 30 | Fill #0

## 2018-02-03 MED FILL — POTASSIUM CL ER 20 MEQ TABL: 20 | 5 days supply | Qty: 5 | Fill #0

## 2018-02-03 MED FILL — FUROSEMIDE 40 MG TABLET: 40 | 5 days supply | Qty: 5 | Fill #0

## 2018-02-03 MED FILL — LISINOPRIL 20 MG TABLET: 20 | 30 days supply | Qty: 30 | Fill #0 | Status: TO

## 2018-02-03 MED FILL — ATORVASTATIN CALCIUM 80 MG: 80 | 30 days supply | Qty: 30 | Fill #0 | Status: TO

## 2018-02-03 NOTE — Progress Notes (Signed)
CARDIAC REHAB PHASE I   Pt was offered walk and refused. Already ambulated this morning with nurse and expects discharge soon.  Philis Kendall, MS 02/03/2018 9:17 AM

## 2018-02-03 NOTE — Care Management Note (Signed)
Case Management Note Marvetta Gibbons RN, BSN Transitions of Care Unit 4E- RN Case Manager [phone]  Patient Details  Name: [name] MRN: [id number] Date of Birth: 1940/08/10  Subjective/Objective:  Pt admitted s/p CABG                  Action/Plan: PTA pt lived at home, independent, plan to return home, no CM needs noted for transition home.   Expected Discharge Date:  02/03/18               Expected Discharge Plan:  Home/Self Care  In-House Referral:  NA  Discharge planning Services  CM Consult  Post Acute Care Choice:  NA Choice offered to:  NA  DME Arranged:    DME Agency:     HH Arranged:    HH Agency:     Status of Service:  Completed, signed off  If discussed at [place] of Stay Meetings, dates discussed:    Discharge Disposition: home/self care  Additional Comments:  Dawayne Patricia, RN 02/03/2018, 1:25 PM

## 2018-02-03 NOTE — Progress Notes (Signed)
Pt ambulated 470 feet in hall tolerated well

## 2018-02-03 NOTE — Progress Notes (Addendum)
      [address]       [place], [address]             [phone]        6 Days Post-Op Procedure(s) (LRB): CORONARY ARTERY BYPASS GRAFTING (CABG) X 4 USING LEFT INTERNAL MAMMARY ARTERY AND LEFT GREATER SAPHENOUS VEIN HARVESTED ENDOSCOPICALLY (N/A) TRANSESOPHAGEAL ECHOCARDIOGRAM (TEE) (N/A) CLIPPING OF ATRIAL APPENDAGE  Subjective: Patient just waking up this am. He has no specific complaints.  Objective: Vital signs in last 24 hours: Temp:  [98.4 F (36.9 C)-98.6 F (37 C)] 98.5 F (36.9 C) (11/18 0359) Pulse Rate:  [74-81] 77 (11/18 0359) Cardiac Rhythm: Atrial fibrillation (11/17 1929) Resp:  [16-28] 28 (11/18 0359) BP: (96-158)/(43-77) 133/72 (11/18 0359) SpO2:  [94 %-99 %] 94 % (11/18 0359) Weight:  [104.4 kg] 104.4 kg (11/18 0359)  Pre op weight 101.7 kg Current Weight  02/03/18 104.4 kg      Intake/Output from previous day: 11/17 0701 - 11/18 0700 In: 960 [P.O.:960] Out: 1100 [Urine:1100]   Physical Exam:  Cardiovascular: IRRR IRRR Pulmonary: Slightly diminished at bases Abdomen: Soft, non tender, bowel sounds present. Extremities: Mild bilateral lower extremity edema. Wounds: Clean and dry.  No erythema or signs of infection.  Lab Results: CBC: Recent Labs    02/01/18 0316  WBC 11.6*  HGB 12.9*  HCT 40.5  PLT 207   BMET:  Recent Labs    02/01/18 0316  NA 134*  K 4.1  CL 98  CO2 26  GLUCOSE 108*  BUN 21  CREATININE 0.82  CALCIUM 9.0    PT/INR:  Lab Results  Component Value Date   INR 1.33 01/28/2018   INR 1.04 01/28/2018   INR 3.6 11/22/2015   PROTIME 26.4 (H) 01/17/2007   PROTIME 22.8 (H) 01/02/2007   PROTIME 26.4 (H) 12/20/2006   ABG:  INR: Will add last result for INR, ABG once components are confirmed Will add last 4 CBG results once components are confirmed  Assessment/Plan:  1. CV - Chronic a fib and rate controlled of late. Had LA clip during surgery. On Lisinopril 10 mg daily and Apixaban 5 mg  bid. Per cardiology, no BB at this time. Will increase Lisinopril to 20 mg daily for better BP control. Asked pharmacy to consult on dose of Apixaban as patient came in on 5 mg bid. 2.  Pulmonary - On room air. Encourage incentive spirometer 3. Volume Overload - Continue Lasix 40 mg daily 4. Chest tube sutures to remain-will remove in office after discharge 5. Will discharge later today   Donielle M ZimmermanPA-C 02/03/2018,7:07 AM [phone]   Chart reviewed, patient examined, agree with above. He is progressing well. Plan home today. I discussed discharge instructions with patient and wife.

## 2018-02-03 NOTE — Progress Notes (Signed)
Toma Copier to be D/C'd Home per MD order. Discussed with the patient and all questions fully answered.    IV catheter discontinued intact. Site without signs and symptoms of complications. Dressing and pressure applied.  An After Visit Summary was printed and given to the patient.  Patient escorted via [place], and D/C home via private auto.  Cyndra Numbers  02/03/2018 12:53 PM

## 2018-02-04 ENCOUNTER — Telehealth (HOSPITAL_COMMUNITY): Payer: Self-pay

## 2018-02-04 MED FILL — Magnesium Sulfate Inj 50%: INTRAMUSCULAR | Qty: 10 | Status: AC

## 2018-02-04 MED FILL — Potassium Chloride Inj 2 mEq/ML: INTRAVENOUS | Qty: 40 | Status: AC

## 2018-02-04 MED FILL — Mannitol IV Soln 20%: INTRAVENOUS | Qty: 500 | Status: AC

## 2018-02-04 MED FILL — Electrolyte-R (PH 7.4) Solution: INTRAVENOUS | Qty: 3000 | Status: AC

## 2018-02-04 MED FILL — Sodium Bicarbonate IV Soln 8.4%: INTRAVENOUS | Qty: 50 | Status: AC

## 2018-02-04 MED FILL — Lidocaine HCl(Cardiac) IV PF Soln Pref Syr 100 MG/5ML (2%): INTRAVENOUS | Qty: 5 | Status: AC

## 2018-02-04 MED FILL — Heparin Sodium (Porcine) Inj 1000 Unit/ML: INTRAMUSCULAR | Qty: 30 | Status: AC

## 2018-02-04 MED FILL — Sodium Chloride IV Soln 0.9%: INTRAVENOUS | Qty: 2000 | Status: AC

## 2018-02-04 MED FILL — Heparin Sodium (Porcine) Inj 1000 Unit/ML: INTRAMUSCULAR | Qty: 20 | Status: AC

## 2018-02-04 NOTE — Telephone Encounter (Signed)
Made initial call to patient in regards to Cardiac Rehab - Pt is interested in the program. Explained scheduling process and went over insurance, pt verbalized understanding. Will contact patient for scheduling once f/u appt has been completed.

## 2018-02-04 NOTE — Telephone Encounter (Signed)
Pt's insurance is active and benefits verified through [facility].  $20.00 Co-pay, deductible amount of $0.00/$0.00, out of pocket amount $4,400/$44.45, no co-insurance, and no pre-authorization is required. Passport/reference # - [id number]  Will make initial call to pt in regards to Cardiac Rehab. If interested, pt will need to complete follow up appt. Once completed, pt will be contacted for scheduling.

## 2018-02-05 ENCOUNTER — Ambulatory Visit: Payer: Medicare Other | Admitting: Cardiovascular Disease

## 2018-02-05 ENCOUNTER — Other Ambulatory Visit: Payer: Self-pay | Admitting: *Deleted

## 2018-02-05 DIAGNOSIS — I4821 Permanent atrial fibrillation: Secondary | ICD-10-CM

## 2018-02-05 MED ORDER — APIXABAN 5 MG PO TABS: 5.0000 mg | ORAL_TABLET | Freq: Two times a day (BID) | ORAL | 0 refills | Status: DC

## 2018-02-06 ENCOUNTER — Other Ambulatory Visit: Payer: Self-pay

## 2018-02-06 ENCOUNTER — Telehealth: Payer: Self-pay

## 2018-02-06 MED ORDER — FUROSEMIDE 40 MG PO TABS: 40.0000 mg | ORAL_TABLET | Freq: Every day | ORAL | 0 refills | Status: DC

## 2018-02-06 NOTE — Telephone Encounter (Signed)
Patient's wife contacted the office concerned about her husbands lower extremity edema.  He is s/p CABG x4 01/28/18 and was just discharged from the hospital a couple of days ago.  She stated that he has not been weighing himself daily and not elevating his legs as much as he should.  He is not wearing any compression stockings either.  He is on a 5 day course of Lasix 40 mg daily which he has taking 3/5 days so far.  Dr. Cyndia Bent advised to continue lasix daily and continue for another week.  Sent electronically prescription for lasix x5 days to the pharmacy of their choice.  I also advised on daily weights, elevating legs when seated, and getting a pair of compression stockings.  She acknowledged receipt and thanked me for the call.

## 2018-02-07 ENCOUNTER — Telehealth: Payer: Self-pay

## 2018-02-07 ENCOUNTER — Ambulatory Visit (INDEPENDENT_AMBULATORY_CARE_PROVIDER_SITE_OTHER): Payer: Self-pay

## 2018-02-07 DIAGNOSIS — Z4802 Encounter for removal of sutures: Secondary | ICD-10-CM

## 2018-02-07 NOTE — Telephone Encounter (Signed)
Patient contacted the office with questions after his visit for suture removal.  He asked if he could take melatonin, because he has had trouble sleeping and has taken it before.  Patient advised he could take up to 10 mg of melatonin if needed for sleep.  Also asked if he could use a salt substitute in replace of salt.  I advised to check the sodium content of his ingredients and use in moderation.  Also stated he could use Mrs. Dash for seasoning foods.  All questions answered and acknowledged receipt.

## 2018-02-10 ENCOUNTER — Telehealth: Payer: Self-pay

## 2018-02-10 NOTE — Telephone Encounter (Signed)
Patient's wife contacted the office requesting medication prescription to help him sleep.  She stated that he is only getting about an hour of sleep nightly.  I advised he can take up to 10 mg of melatonin at night or benadryl.  She stated he was taking melatonin but was not helping, she was going to check to see how much he was taking and also try benadryl if the melatonin does not help.  I also advised she could contact his PCP if a prescription was requested.  She acknowledged receipt.

## 2018-02-11 ENCOUNTER — Encounter: Payer: Self-pay | Admitting: Cardiology

## 2018-02-11 ENCOUNTER — Ambulatory Visit (INDEPENDENT_AMBULATORY_CARE_PROVIDER_SITE_OTHER): Payer: Medicare Other | Admitting: Cardiology

## 2018-02-11 VITALS — BP 96/60 | HR 89 | Ht 75.0 in | Wt 220.0 lb

## 2018-02-11 DIAGNOSIS — E785 Hyperlipidemia, unspecified: Secondary | ICD-10-CM

## 2018-02-11 DIAGNOSIS — I10 Essential (primary) hypertension: Secondary | ICD-10-CM | POA: Diagnosis not present

## 2018-02-11 DIAGNOSIS — Z9861 Coronary angioplasty status: Secondary | ICD-10-CM

## 2018-02-11 DIAGNOSIS — Z951 Presence of aortocoronary bypass graft: Secondary | ICD-10-CM

## 2018-02-11 DIAGNOSIS — Z85038 Personal history of other malignant neoplasm of large intestine: Secondary | ICD-10-CM

## 2018-02-11 DIAGNOSIS — Z7901 Long term (current) use of anticoagulants: Secondary | ICD-10-CM

## 2018-02-11 DIAGNOSIS — I2510 Atherosclerotic heart disease of native coronary artery without angina pectoris: Secondary | ICD-10-CM

## 2018-02-11 DIAGNOSIS — I6523 Occlusion and stenosis of bilateral carotid arteries: Secondary | ICD-10-CM

## 2018-02-11 DIAGNOSIS — I214 Non-ST elevation (NSTEMI) myocardial infarction: Secondary | ICD-10-CM | POA: Diagnosis not present

## 2018-02-11 DIAGNOSIS — I4821 Permanent atrial fibrillation: Secondary | ICD-10-CM

## 2018-02-11 MED ORDER — ZOLPIDEM TARTRATE 5 MG PO TABS: 5.0000 mg | ORAL_TABLET | Freq: Every evening | ORAL | 0 refills | Status: DC | PRN

## 2018-02-11 MED ORDER — LISINOPRIL 20 MG PO TABS: 20.0000 mg | ORAL_TABLET | Freq: Every day | ORAL | 1 refills | Status: DC

## 2018-02-11 NOTE — Progress Notes (Signed)
02/11/2018 [name]   1940/08/18  [id number]  Primary Physician Lavone Orn, MD Primary Cardiologist: Dr Gwenlyn Found  HPI:   Pleasant 77 yo white male followed by Dr Gwenlyn Found with a history of remote RCA PCI in 1999, hyperlipidemia, HTN, moderate carotid Artery stenosis, chronic atrial fibrillation on Eliquis, and colon cancer S/P colon resection with Chemo and radiation about 8 years ago.  He has been very active since his PCI, exercising 5 days a week.  He presented to [facility] 01/23/18 with chest pain and ruled in for a non-ST elevation MI.  Catheterization done revealed progression and coronary disease.  He underwent bypass grafting x4 on November 12 with an LIMA to the LAD, SVG to the OM1, and SVG to the PDA and PL sequentially.  Ejection fraction was normal, 55 to 60% with moderate left atrial enlargement by echo.  His LDL was 71.  He seen in the office today as a post hospital follow-up.  He says he is "moving slow".  His main complaint is fatigue.  He says he cannot sleep for more than an hour or 2 at night and he is exhausted during the day.  He has been up walking at least 3 times a day and using his instead of spirometer.  He has had some productive cough but denies orthopnea, tachycardia, or syncope.   Current Outpatient Medications  Medication Sig Dispense Refill  . acetaminophen (TYLENOL) 325 MG tablet Take 2 tablets (650 mg total) by mouth every 6 (six) hours as needed for mild pain.    Ilean Skill Lipoic Acid 200 MG CAPS Take 600 mg by mouth 2 (two) times daily.     [name] apixaban (ELIQUIS) 5 MG TABS tablet Take 1 tablet (5 mg total) by mouth 2 (two) times daily. 60 tablet 0  . aspirin 81 MG tablet Take 81 mg by mouth daily.    [name] atorvastatin (LIPITOR) 80 MG tablet Take 1 tablet (80 mg total) by mouth daily at 6 PM. 30 tablet 1  . cholecalciferol (VITAMIN D) 1000 UNITS tablet Take 1,000 Units by mouth daily.    . Coenzyme Q10 200 MG capsule Take 100 mg by mouth 2 (two) times daily.     .  Cyanocobalamin (VITAMIN B 12 PO) Take 100 mg by mouth daily.     . Flaxseed, Linseed, (FLAX SEED OIL PO) Take 1,400 mg by mouth daily.    [name] lisinopril (PRINIVIL,ZESTRIL) 20 MG tablet Take 1 tablet (20 mg total) by mouth daily. HOLD UNTIL YOUR APPT WITH DR Glynda Jaeger 30 tablet 1  . Omega-3 Fatty Acids (FISH OIL) 1200 MG CAPS Take 1,000 mg by mouth daily.     . traMADol (ULTRAM) 50 MG tablet Take 50 mg by mouth every 4-6 hours PRN severe pain. 30 tablet 0  . vitamin C (ASCORBIC ACID) 500 MG tablet Take 1,000 mg by mouth 2 (two) times daily.    [name] zolpidem (AMBIEN) 5 MG tablet Take 1 tablet (5 mg total) by mouth at bedtime as needed for up to 14 days for sleep. 14 tablet 0   No current facility-administered medications for this visit.     No Known Allergies  Past Medical History:  Diagnosis Date  . A-fib ([place])   . Allergy    Seasonal--pollen  . Anticoagulation goal of INR 2 to 3    on coumadin  . CAD (coronary artery disease) 1999   RCA stent  . Carotid stenosis, asymptomatic    moderate RT  and mild LT with carotid dopplers 10/26/11  . Colon cancer ([place]) 04/2006   Stage III--T3 N1  . COPD (chronic obstructive pulmonary disease) ([place])   . H/O cardiovascular stress test 01/13/2010   low risk scan, similar to 2008 study  . H/O echocardiogram 05/23/2009   EF >55%, mild MR, Mild TR,   . Hand foot syndrome   . Hemorrhoids   . Hyperlipidemia   . Hypertension   . Neuropathy   . Permanent atrial fibrillation   . Psoriasis   . Tubular adenoma of colon     Social History   Socioeconomic History  . Marital status: Married    Spouse name: [name]  . Number of children: 2  . Years of education: Not on file  . Highest education level: Not on file  Occupational History  . Occupation: retired    Fish farm manager: RETIRED  Social Needs  . Financial resource strain: Not on file  . Food insecurity:    Worry: Not on file    Inability: Not on file  . Transportation needs:    Medical: Not on file     Non-medical: Not on file  Tobacco Use  . Smoking status: Former Smoker    Years: 30.00    Last attempt to quit: 11/25/1993    Years since quitting: 24.2  . Smokeless tobacco: Never Used  Substance and Sexual Activity  . Alcohol use: Yes    Alcohol/week: 1.0 standard drinks    Types: 1 Cans of beer per week  . Drug use: No  . Sexual activity: Not on file  Lifestyle  . Physical activity:    Days per week: Not on file    Minutes per session: Not on file  . Stress: Not on file  Relationships  . Social connections:    Talks on phone: Not on file    Gets together: Not on file    Attends religious service: Not on file    Active member of club or organization: Not on file    Attends meetings of clubs or organizations: Not on file    Relationship status: Not on file  . Intimate partner violence:    Fear of current or ex partner: Not on file    Emotionally abused: Not on file    Physically abused: Not on file    Forced sexual activity: Not on file  Other Topics Concern  . Not on file  Social History Narrative  . Not on file     Family History  Problem Relation Age of Onset  . Heart disease Father 82  . Heart disease Paternal Grandmother   . Heart disease Mother   . Hypertension Mother   . Healthy Sister   . Heart disease Paternal Grandfather   . Hypertension Paternal Grandfather   . Colon cancer Neg Hx      Review of Systems: General: negative for chills, fever, night sweats or weight changes.  Cardiovascular: negative for chest pain, dyspnea on exertion, edema, orthopnea, palpitations, paroxysmal nocturnal dyspnea or shortness of breath Dermatological: negative for rash Respiratory: negative for cough or wheezing Urologic: negative for hematuria Abdominal: negative for nausea, vomiting, diarrhea, bright red blood per rectum, melena, or hematemesis Neurologic: negative for visual changes, syncope, or dizziness All other systems reviewed and are otherwise negative except  as noted above.    Blood pressure 96/60, pulse 89, height 6\' 3"  (1.905 m), weight 220 lb (99.8 kg), SpO2 98 %.  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 I suggested he hold Lisinopril until seen by Dr. Mohammed Kindle.  His systolic blood pressure is 96.  I will have him see Dr. Gwenlyn Found in about 6 weeks.  I did prescribe Ambien 5 mg at bedtime as needed with no refills.  [name] PA-C 02/11/2018 8:50 AM

## 2018-02-11 NOTE — Assessment & Plan Note (Signed)
Chronic atrial fibrillation

## 2018-02-11 NOTE — Assessment & Plan Note (Signed)
Eliquis 

## 2018-02-11 NOTE — Assessment & Plan Note (Signed)
moderate RT and mild LT

## 2018-02-11 NOTE — Assessment & Plan Note (Signed)
01/22/18

## 2018-02-11 NOTE — Assessment & Plan Note (Signed)
01/28/18- LIMA-LAD, SVG-OM1, SVG-PD-PL EF 55-60%

## 2018-02-11 NOTE — Patient Instructions (Addendum)
Medication Instructions:  START Ambien 5mg  Take 1 tablet as needed for sleep at bedtime STOP Lasix AND Potassium HOLD Lisinopril until you see Dr Glynda Jaeger  If you need a refill on your cardiac medications before your next appointment, please call your pharmacy.   Lab work: None  If you have labs (blood work) drawn today and your tests are completely normal, you will receive your results only by: [name] MyChart Message (if you have MyChart) OR . A paper copy in the mail If you have any lab test that is abnormal or we need to change your treatment, we will call you to review the results.  Testing/Procedures: None   Follow-Up: At [facility], you and your health needs are our priority.  As part of our continuing mission to provide you with exceptional heart care, we have created designated Provider Care Teams.  These Care Teams include your primary Cardiologist (physician) and Advanced Practice Providers (APPs -  Physician Assistants and Nurse Practitioners) who all work together to provide you with the care you need, when you need it. . Your physician recommends that you schedule a follow-up appointment in: 6 weeks with Dr Gwenlyn Found  Any Other Special Instructions Will Be Listed Below (If Applicable).

## 2018-02-11 NOTE — Assessment & Plan Note (Signed)
LDL 71 Nov 2019

## 2018-02-17 ENCOUNTER — Telehealth (HOSPITAL_COMMUNITY): Payer: Self-pay

## 2018-02-17 ENCOUNTER — Telehealth: Payer: Self-pay | Admitting: Cardiovascular Disease

## 2018-02-17 NOTE — Telephone Encounter (Signed)
Called patient to see if he was interested in participating in the Cardiac Rehab Program. Patient stated yes. Patient will come in for orientation on 04/08/2018 @ 1:30PM and will attend the 1:15PM exercise class.  Mailed homework package.

## 2018-02-17 NOTE — Telephone Encounter (Signed)
Pt c/o swelling: STAT is pt has developed SOB within 24 hours  1) How much weight have you gained and in what time span? 5 lbs in one week  2) If swelling, where is the swelling located? Feet and ankles   3) Are you currently taking a fluid pill? no  4) Are you currently SOB? no  5) Do you have a log of your daily weights (if so, list)? no  6) Have you gained 3 pounds in a day or 5 pounds in a week? 5lbs in one week   7) Have you traveled recently? no   Patient states he was given a water pill for 5 days when he was discharged from the hospital. He no longer has anymore.

## 2018-02-17 NOTE — Telephone Encounter (Signed)
Spoke with pt who states since last Wednesday he has been experiencing swelling in his legs and feet. He denies any SOB or other symptoms. BP has been WNL. He reports he has been following the fluid restriction diet and elevating his legs, but swelling has not decreased. He states he has gained 5 lbs in one week.  Pt was given a 7 day prescription for 40 mg lasix at discharged but has completed dose. He was seen by [name], [place] on 11/26. Will route to PA for recommendation.

## 2018-02-18 MED ORDER — FUROSEMIDE 40 MG PO TABS: 40.0000 mg | ORAL_TABLET | Freq: Every day | ORAL | 0 refills | Status: DC

## 2018-02-18 MED ORDER — POTASSIUM CHLORIDE CRYS ER 20 MEQ PO TBCR: 20.0000 meq | EXTENDED_RELEASE_TABLET | Freq: Every day | ORAL | 0 refills | Status: DC

## 2018-02-18 NOTE — Telephone Encounter (Signed)
I will give Mr. Pullman another 7 days of Lasix 40 mg daily as well as potassium 20 mEq daily.  He should probably see me in 7 to 10 days.  [name] PA-C 02/18/2018 8:10 AM

## 2018-02-18 NOTE — Telephone Encounter (Signed)
Pt updated with PA's recommendation. Pt verbalized understanding. New prescription sent to requested Pharmacy. Appointment scheduled for 12/11 at 11 am with [name], PA.

## 2018-02-24 ENCOUNTER — Telehealth (HOSPITAL_COMMUNITY): Payer: Self-pay

## 2018-02-26 ENCOUNTER — Ambulatory Visit (INDEPENDENT_AMBULATORY_CARE_PROVIDER_SITE_OTHER): Payer: Medicare Other | Admitting: Cardiology

## 2018-02-26 ENCOUNTER — Encounter: Payer: Self-pay | Admitting: Cardiology

## 2018-02-26 VITALS — BP 118/72 | HR 64 | Ht 75.0 in | Wt 220.0 lb

## 2018-02-26 DIAGNOSIS — I4821 Permanent atrial fibrillation: Secondary | ICD-10-CM

## 2018-02-26 DIAGNOSIS — I214 Non-ST elevation (NSTEMI) myocardial infarction: Secondary | ICD-10-CM | POA: Diagnosis not present

## 2018-02-26 DIAGNOSIS — E785 Hyperlipidemia, unspecified: Secondary | ICD-10-CM

## 2018-02-26 DIAGNOSIS — Z951 Presence of aortocoronary bypass graft: Secondary | ICD-10-CM | POA: Diagnosis not present

## 2018-02-26 DIAGNOSIS — I10 Essential (primary) hypertension: Secondary | ICD-10-CM | POA: Diagnosis not present

## 2018-02-26 DIAGNOSIS — R600 Localized edema: Secondary | ICD-10-CM | POA: Insufficient documentation

## 2018-02-26 DIAGNOSIS — Z7901 Long term (current) use of anticoagulants: Secondary | ICD-10-CM

## 2018-02-26 NOTE — Progress Notes (Signed)
02/26/2018 [name]   1940/04/11  [id number]  Primary Physician Lavone Orn, MD Primary Cardiologist: Dr Gwenlyn Found  HPI:  Pleasant 77 yo white male followed by Dr Gwenlyn Found who underwent bypass grafting x4 on November 12 with an LIMA to the LAD, SVG to the OM1, and SVG to the PDA and PL sequentially.  Ejection fraction was normal, 55 to 60% with moderate left atrial enlargement by echo.  He was seen in the office 02/11/18 as a post hospital follow-up.  He indicated he was "moving slow".  His main complaint was fatigue and the fact that he could not sleep for more than an hour or 2 at night and he is exhausted during the day.  A few days after seeing him he called complaining of edema in his legs and I prescribed another week of Lasix.   He presents today for follow up.  His wife again accompanied him.  Patient has some residual edema in his left leg but the right leg looks good.  The left leg was the site of his venectomy.  He still is most concerned about his inability to get a sleep.  He tells me he goes to bed falls asleep and then wakes up 3 hours later.  He is fatigue during the day.  It turns out he is taking naps during the day and then drinking coffee to try and stay awake.   Current Outpatient Medications  Medication Sig Dispense Refill  . acetaminophen (TYLENOL) 325 MG tablet Take 2 tablets (650 mg total) by mouth every 6 (six) hours as needed for mild pain.    Ilean Skill Lipoic Acid 200 MG CAPS Take 600 mg by mouth 2 (two) times daily.     [name] apixaban (ELIQUIS) 5 MG TABS tablet Take 1 tablet (5 mg total) by mouth 2 (two) times daily. 60 tablet 0  . aspirin 81 MG tablet Take 81 mg by mouth daily.    [name] atorvastatin (LIPITOR) 80 MG tablet Take 1 tablet (80 mg total) by mouth daily at 6 PM. 30 tablet 1  . cholecalciferol (VITAMIN D) 1000 UNITS tablet Take 1,000 Units by mouth daily.    . Coenzyme Q10 200 MG capsule Take 100 mg by mouth 2 (two) times daily.     . Cyanocobalamin  (VITAMIN B 12 PO) Take 100 mg by mouth daily.     . Flaxseed, Linseed, (FLAX SEED OIL PO) Take 1,400 mg by mouth daily.    [name] lisinopril (PRINIVIL,ZESTRIL) 20 MG tablet Take 1 tablet (20 mg total) by mouth daily. HOLD UNTIL YOUR APPT WITH DR Glynda Jaeger 30 tablet 1  . Omega-3 Fatty Acids (FISH OIL) 1200 MG CAPS Take 1,000 mg by mouth daily.     . traMADol (ULTRAM) 50 MG tablet Take 50 mg by mouth every 4-6 hours PRN severe pain. 30 tablet 0  . vitamin C (ASCORBIC ACID) 500 MG tablet Take 1,000 mg by mouth 2 (two) times daily.    . potassium chloride SA (KLOR-CON M20) 20 MEQ tablet Take 1 tablet (20 mEq total) by mouth daily for 7 days. 7 tablet 0  . zolpidem (AMBIEN) 5 MG tablet Take 1 tablet (5 mg total) by mouth at bedtime as needed for up to 14 days for sleep. 14 tablet 0   No current facility-administered medications for this visit.     No Known Allergies  Past Medical History:  Diagnosis Date  . A-fib ([place])   . Allergy  Seasonal--pollen  . Anticoagulation goal of INR 2 to 3    on coumadin  . CAD (coronary artery disease) 1999   RCA stent  . Carotid stenosis, asymptomatic    moderate RT and mild LT with carotid dopplers 10/26/11  . Colon cancer ([place]) 04/2006   Stage III--T3 N1  . COPD (chronic obstructive pulmonary disease) ([place])   . H/O cardiovascular stress test 01/13/2010   low risk scan, similar to 2008 study  . H/O echocardiogram 05/23/2009   EF >55%, mild MR, Mild TR,   . Hand foot syndrome   . Hemorrhoids   . Hyperlipidemia   . Hypertension   . Neuropathy   . Permanent atrial fibrillation   . Psoriasis   . Tubular adenoma of colon     Social History   Socioeconomic History  . Marital status: Married    Spouse name: [name]  . Number of children: 2  . Years of education: Not on file  . Highest education level: Not on file  Occupational History  . Occupation: retired    Fish farm manager: RETIRED  Social Needs  . Financial resource strain: Not on file  . Food  insecurity:    Worry: Not on file    Inability: Not on file  . Transportation needs:    Medical: Not on file    Non-medical: Not on file  Tobacco Use  . Smoking status: Former Smoker    Years: 30.00    Last attempt to quit: 11/25/1993    Years since quitting: 24.2  . Smokeless tobacco: Never Used  Substance and Sexual Activity  . Alcohol use: Yes    Alcohol/week: 1.0 standard drinks    Types: 1 Cans of beer per week  . Drug use: No  . Sexual activity: Not on file  Lifestyle  . Physical activity:    Days per week: Not on file    Minutes per session: Not on file  . Stress: Not on file  Relationships  . Social connections:    Talks on phone: Not on file    Gets together: Not on file    Attends religious service: Not on file    Active member of club or organization: Not on file    Attends meetings of clubs or organizations: Not on file    Relationship status: Not on file  . Intimate partner violence:    Fear of current or ex partner: Not on file    Emotionally abused: Not on file    Physically abused: Not on file    Forced sexual activity: Not on file  Other Topics Concern  . Not on file  Social History Narrative  . Not on file     Family History  Problem Relation Age of Onset  . Heart disease Father 69  . Heart disease Paternal Grandmother   . Heart disease Mother   . Hypertension Mother   . Healthy Sister   . Heart disease Paternal Grandfather   . Hypertension Paternal Grandfather   . Colon cancer Neg Hx      Review of Systems: General: negative for chills, fever, night sweats or weight changes.  Cardiovascular: negative for chest pain, dyspnea on exertion, edema, orthopnea, palpitations, paroxysmal nocturnal dyspnea or shortness of breath Dermatological: negative for rash Respiratory: negative for cough or wheezing Urologic: negative for hematuria Abdominal: negative for nausea, vomiting, diarrhea, bright red blood per rectum, melena, or  hematemesis Neurologic: negative for visual changes, syncope, or dizziness All other systems reviewed  and are otherwise negative except as noted above.    Blood pressure 118/72, pulse 64, height 6\' 3"  (1.905 m), weight 220 lb (99.8 kg).  General appearance: alert, cooperative, appears older than stated age and no distress Neck: no carotid bruit, no JVD, supple, symmetrical, trachea midline and thyroid not enlarged, symmetric, no tenderness/mass/nodules Lungs: clear to auscultation bilaterally Heart: regular rate and rhythm Extremities: 1+ Lt LE edema Skin: pale, warm, and dry Neurologic: Grossly normal   ASSESSMENT AND PLAN:   Edema Better after a course of Lasix.  I told him he may always have some degree of swelling in the left leg.  Insomnia This has not been an issue for him in the past he says.  He tells me the Ambien "did nothing".  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  His TSH was 4.9 in November.  I told him to stop drinking coffee or any caffeinated beverage.  I asked him to try and not nap during the day.  He just finished his Lasix and I will leave him off this for now.  He will keep his follow-up with the surgeons next week and with Dr. Alvester Chou the first week of January.  He is progressing somewhat slowly, hopefully this will improve with time.  [name] PA-C 02/26/2018 1:47 PM

## 2018-02-26 NOTE — Patient Instructions (Addendum)
Medication Instructions:  STOP LASIX  Your physician recommends that you continue on your current medications as directed. Please refer to the Current Medication list given to you today. If you need a refill on your cardiac medications before your next appointment, please call your pharmacy.   Lab work: None  If you have labs (blood work) drawn today and your tests are completely normal, you will receive your results only by: [name] MyChart Message (if you have MyChart) OR . A paper copy in the mail If you have any lab test that is abnormal or we need to change your treatment, we will call you to review the results.  Testing/Procedures: None   Follow-Up: At [facility], you and your health needs are our priority.  As part of our continuing mission to provide you with exceptional heart care, we have created designated Provider Care Teams.  These Care Teams include your primary Cardiologist (physician) and Advanced Practice Providers (APPs -  Physician Assistants and Nurse Practitioners) who all work together to provide you with the care you need, when you need it. [name] [place] RECOMMENDS YOU FOLLOW UP AS SCHEDULED WITH DR BERRY.  Any Other Special Instructions Will Be Listed Below (If Applicable). STAY AWAY FROM [place]

## 2018-03-03 ENCOUNTER — Ambulatory Visit (INDEPENDENT_AMBULATORY_CARE_PROVIDER_SITE_OTHER): Payer: Self-pay | Admitting: Physician Assistant

## 2018-03-03 ENCOUNTER — Ambulatory Visit
Admission: RE | Admit: 2018-03-03 | Discharge: 2018-03-03 | Disposition: A | Discharge status: Home / Self Care | Payer: Medicare Other | Source: Ambulatory Visit | Attending: Surgery | Admitting: Surgery

## 2018-03-03 ENCOUNTER — Other Ambulatory Visit: Payer: Self-pay | Admitting: Surgery

## 2018-03-03 ENCOUNTER — Other Ambulatory Visit: Payer: Self-pay

## 2018-03-03 VITALS — BP 119/64 | HR 69 | Ht 75.0 in | Wt 220.0 lb

## 2018-03-03 DIAGNOSIS — I4821 Permanent atrial fibrillation: Secondary | ICD-10-CM

## 2018-03-03 DIAGNOSIS — I214 Non-ST elevation (NSTEMI) myocardial infarction: Secondary | ICD-10-CM

## 2018-03-03 DIAGNOSIS — I2510 Atherosclerotic heart disease of native coronary artery without angina pectoris: Secondary | ICD-10-CM

## 2018-03-03 DIAGNOSIS — Z951 Presence of aortocoronary bypass graft: Secondary | ICD-10-CM

## 2018-03-03 NOTE — Patient Instructions (Signed)
Avoid naps and caffeine. May resume driving since he is not requiring any sedating medications Office follow up prn.

## 2018-03-03 NOTE — Progress Notes (Signed)
HPI: Patient returns for routine postoperative follow-up having undergone CABGx4 and clipping of the left atrial appendage on 01/28/18 by Dr. Cyndia Bent.  The patient's post operative while in the hospital was uneventful and he was discharged to home in stable condition.   Since hospital discharge the patient reports having insomnia that has not improved significantly even after stopping all caffeine products. He also has some persistent swelling in both feet but no leg pain on either side. He otherwise feels well and pleased with his progress. He denies pain or shortness of breath.    Current Outpatient Medications  Medication Sig Dispense Refill  . acetaminophen (TYLENOL) 325 MG tablet Take 2 tablets (650 mg total) by mouth every 6 (six) hours as needed for mild pain.    Ilean Skill Lipoic Acid 200 MG CAPS Take 600 mg by mouth 2 (two) times daily.     [name] apixaban (ELIQUIS) 5 MG TABS tablet Take 1 tablet (5 mg total) by mouth 2 (two) times daily. 60 tablet 0  . aspirin 81 MG tablet Take 81 mg by mouth daily.    [name] atorvastatin (LIPITOR) 80 MG tablet Take 1 tablet (80 mg total) by mouth daily at 6 PM. 30 tablet 1  . cholecalciferol (VITAMIN D) 1000 UNITS tablet Take 1,000 Units by mouth daily.    . Coenzyme Q10 200 MG capsule Take 100 mg by mouth 2 (two) times daily.     . Cyanocobalamin (VITAMIN B 12 PO) Take 100 mg by mouth daily.     . Flaxseed, Linseed, (FLAX SEED OIL PO) Take 1,400 mg by mouth daily.    . magnesium oxide (MAG-OX) 400 MG tablet Take 400 mg by mouth daily.    . Omega-3 Fatty Acids (FISH OIL) 1200 MG CAPS Take 1,000 mg by mouth daily.     [name] pyridoxine (B-6) 200 MG tablet Take 200 mg by mouth daily.    . vitamin C (ASCORBIC ACID) 500 MG tablet Take 1,000 mg by mouth 2 (two) times daily.    [name] lisinopril (PRINIVIL,ZESTRIL) 20 MG tablet Take 1 tablet (20 mg total) by mouth daily. HOLD UNTIL YOUR APPT WITH DR Glynda Jaeger (Patient not taking: Reported on 03/03/2018) 30 tablet 1   No current  facility-administered medications for this visit.     Physical Exam: Heart-  Irregularly irregular rhythm but normal rate.  No murmur.  Chest-  Breath sounds are clear bilaterally.  The sternotomy incision is healing with no sign of complication.  There sternum is stable.    Ext.s-  Mild bilateral pre-tibial edema.  No calf tenderness.  The left EVH site is well healed.  There is expected bruising over the [facility] tunnel in the left thigh.  Diagnostic Tests: CXR is satisfactory with no unexpected changes.  Impression / plan CAD presenting with acute MI but preserved LV function--now approximately 6 weeks post CABGx4 and clipping of the left atrial appendage.  Making appropriate progress. Medications reviewed and no changes required from CTS standpoint.  Sternal precautions reviewed.  Chronic atrial fibrillation--rate controlled on no beta blocker or antiarrhythrmic.   S/P left atrial appendage clip.  Eliquis mgt per cardiology.  Lower extremity swelling--he has had a two "courses" of lasix without any significant change. Encouraged elevation as much as possible and reassured him that this tendency would reduce over time but it likely would not completely resolve.  Also advised him to limit sodium intake which had already been discussed by his cardiologist.   Antony Odea, PA-C  [facility] [phone]

## 2018-03-05 ENCOUNTER — Other Ambulatory Visit: Payer: Self-pay | Admitting: Cardiovascular Disease

## 2018-03-17 ENCOUNTER — Telehealth: Payer: Self-pay | Admitting: Cardiovascular Disease

## 2018-03-17 NOTE — Telephone Encounter (Signed)
Attempt to return call- no answer, unable to leave VM

## 2018-03-17 NOTE — Telephone Encounter (Signed)
Patient called stating the swelling in his leg has not gone down.  He states it has gotten worse.  It's not only his ankle and foot it has gone up to his knee, he states it is difficult to even get a shoe on without a sock.  He does have an appt with Dr. Gwenlyn Found on 1/7.

## 2018-03-18 ENCOUNTER — Ambulatory Visit: Payer: Medicare Other | Admitting: Medical

## 2018-03-18 ENCOUNTER — Encounter: Payer: Self-pay | Admitting: Medical

## 2018-03-18 VITALS — BP 127/66 | HR 74 | Ht 75.0 in | Wt 235.6 lb

## 2018-03-18 DIAGNOSIS — I4821 Permanent atrial fibrillation: Secondary | ICD-10-CM

## 2018-03-18 DIAGNOSIS — Z951 Presence of aortocoronary bypass graft: Secondary | ICD-10-CM | POA: Diagnosis not present

## 2018-03-18 DIAGNOSIS — R600 Localized edema: Secondary | ICD-10-CM

## 2018-03-18 DIAGNOSIS — I10 Essential (primary) hypertension: Secondary | ICD-10-CM

## 2018-03-18 DIAGNOSIS — E785 Hyperlipidemia, unspecified: Secondary | ICD-10-CM

## 2018-03-18 DIAGNOSIS — I2510 Atherosclerotic heart disease of native coronary artery without angina pectoris: Secondary | ICD-10-CM

## 2018-03-18 MED ORDER — FUROSEMIDE 40 MG PO TABS: 40.0000 mg | ORAL_TABLET | Freq: Every day | ORAL | 3 refills | Status: DC

## 2018-03-18 MED ORDER — POTASSIUM CHLORIDE ER 20 MEQ PO TBCR: 20.0000 meq | EXTENDED_RELEASE_TABLET | Freq: Every day | ORAL | 3 refills | Status: DC

## 2018-03-18 NOTE — Telephone Encounter (Signed)
Correction patient had recent CABG not bypass of leg.

## 2018-03-18 NOTE — Telephone Encounter (Signed)
Returned call to patient he stated he had recent bypass surgery of left leg.Stated he has had swelling in left leg but has become worse.No pain just swelling.Appointment scheduled with Almyra Deforest PA this afternoon at 2:00 pm.

## 2018-03-18 NOTE — Patient Instructions (Signed)
Medication Instructions:  Start Lasix 40 mg daily. Start Potassium 20 meq daily along with Lasix. If you need a refill on your cardiac medications before your next appointment, please call your pharmacy.   Lab work: BMET (one week before appointment with Dr.Berry) If you have labs (blood work) drawn today and your tests are completely normal, you will receive your results only by: [name] MyChart Message (if you have MyChart) OR . A paper copy in the mail If you have any lab test that is abnormal or we need to change your treatment, we will call you to review the results.  Follow-Up: At [facility], you and your health needs are our priority.  As part of our continuing mission to provide you with exceptional heart care, we have created designated Provider Care Teams.  These Care Teams include your primary Cardiologist (physician) and Advanced Practice Providers (APPs -  Physician Assistants and Nurse Practitioners) who all work together to provide you with the care you need, when you need it. [name] Keep appointment with Dr.Berry next week.  Any Other Special Instructions Will Be Listed Below (If Applicable). Start low sodium diet. Start daily weights

## 2018-03-18 NOTE — Progress Notes (Signed)
Cardiology Office Note   Date:  03/18/2018   ID:  Cadin, Luka May 08, 1940, MRN [id number]  PCP:  Lavone Orn, MD  Cardiologist:  [name], MD EP: None  Chief Complaint  Patient presents with  . Leg Swelling      History of Present Illness: GORAN OLDEN is a 77 y.o. 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  He was last seen outpatient by [name] 02/26/18 for follow-up of LE edema (L>R). He had completed a 1 week course of lasix at that time and was noted to have improvement in his LE edema. Lasix was discontinued at that time. He was seen by Ellwood Handler, PA-C with CT surgery 03/03/18 and was encouraged to elevate his legs and reassured that LE edema should improve over time but will likely not resolve completely. Echo 01/22/18 with EF 55-60% but unable to assess diastolic dysfunction due to underlying atrial fibrillation.   Patient returns today with complaints of L>R LE edema. This has been worsening since he was last seen by Cardiology 02/26/18 despite LE elevation at home. He states he enjoys cooking and has been trying to be more mindful of salt intake, although I suspect he is still getting more than 2g per day. He reported initially monitoring his weight daily at home following his CABG, however stopped doing so in the last several weeks. His weight was 220lbs when last seen for his CT surgery follow-up appointment 03/03/18, compared to today's weight of 235lbs.  He denies chest pain, SOB at rest, orthopnea, PND, dizziness, lightheadedness, or syncope. His only other complaint at this time is persistent insomnia.     Past Medical History:  Diagnosis Date  . A-fib ([place])   . Allergy    Seasonal--pollen  . Anticoagulation goal of INR 2 to 3    on coumadin  . CAD (coronary artery disease) 1999   RCA stent  . Carotid stenosis, asymptomatic    moderate RT and mild LT with carotid dopplers 10/26/11  . Colon cancer ([place]) 04/2006   Stage III--T3 N1  . COPD (chronic obstructive pulmonary disease) ([place])   . H/O cardiovascular stress test 01/13/2010   low risk scan, similar to 2008 study  . H/O echocardiogram 05/23/2009   EF >55%, mild MR, Mild TR,   . Hand foot syndrome   . Hemorrhoids   . Hyperlipidemia   . Hypertension   . Neuropathy   . Permanent atrial fibrillation   . Psoriasis   . Tubular adenoma of colon     Past Surgical History:  Procedure Laterality Date  . CLIPPING OF ATRIAL APPENDAGE  01/28/2018   Procedure: CLIPPING OF ATRIAL APPENDAGE;  Surgeon: Gaye Pollack, MD;  Location: [facility] OR;  Service: Open Heart Surgery;;  . COLONOSCOPY    . CORONARY ARTERY BYPASS GRAFT N/A 01/28/2018   Procedure: CORONARY ARTERY BYPASS GRAFTING (CABG) X 4 USING LEFT INTERNAL MAMMARY ARTERY AND LEFT GREATER SAPHENOUS VEIN HARVESTED ENDOSCOPICALLY;  Surgeon: Gaye Pollack, MD;  Location: [facility];  Service: Open Heart Surgery;  Laterality: N/A;  . CORONARY STENT PLACEMENT  1999   RCA tandem NIR stents  . LEFT HEART CATH AND CORONARY ANGIOGRAPHY N/A 01/23/2018   Procedure: LEFT HEART CATH AND CORONARY ANGIOGRAPHY;  Surgeon: Nelva Bush, MD;  Location: [facility];  Service: Cardiovascular;  Laterality: N/A;  . RIGHT COLECTOMY  05/29/2006   Laparoscopic assisted  . TEE WITHOUT CARDIOVERSION N/A 01/28/2018   Procedure: TRANSESOPHAGEAL ECHOCARDIOGRAM (TEE);  Surgeon: Gaye Pollack, MD;  Location: [facility];  Service: Open Heart Surgery;  Laterality: N/A;  . TONSILLECTOMY       Current Outpatient Medications  Medication Sig Dispense Refill  . Alpha Lipoic Acid 200 MG CAPS Take 600 mg by mouth 2 (two) times daily.     [name] apixaban (ELIQUIS) 5 MG TABS tablet Take 1 tablet (5 mg total) by mouth 2 (two) times daily. 60 tablet 0  . aspirin 81 MG tablet Take 81 mg by mouth daily.    [name]  atorvastatin (LIPITOR) 80 MG tablet Take 1 tablet (80 mg total) by mouth daily at 6 PM. 30 tablet 1  . cholecalciferol (VITAMIN D) 1000 UNITS tablet Take 1,000 Units by mouth daily.    . Coenzyme Q10 200 MG capsule Take 100 mg by mouth 2 (two) times daily.     . Cyanocobalamin (VITAMIN B 12 PO) Take 100 mg by mouth daily.     . Flaxseed, Linseed, (FLAX SEED OIL PO) Take 1,400 mg by mouth daily.    . magnesium oxide (MAG-OX) 400 MG tablet Take 400 mg by mouth daily.    . Omega-3 Fatty Acids (FISH OIL) 1200 MG CAPS Take 1,000 mg by mouth daily.     [name] pyridoxine (B-6) 200 MG tablet Take 200 mg by mouth daily.    . vitamin C (ASCORBIC ACID) 500 MG tablet Take 1,000 mg by mouth 2 (two) times daily.    . furosemide (LASIX) 40 MG tablet Take 1 tablet (40 mg total) by mouth daily. 90 tablet 3  . lisinopril (PRINIVIL,ZESTRIL) 20 MG tablet Take 1 tablet (20 mg total) by mouth daily. HOLD UNTIL YOUR APPT WITH DR Glynda Jaeger (Patient not taking: Reported on 03/18/2018) 30 tablet 1  . potassium chloride 20 MEQ TBCR Take 20 mEq by mouth daily. 90 tablet 3   No current facility-administered medications for this visit.     Allergies:   Patient has no known allergies.    Social History:  The patient  reports that he quit smoking about 24 years ago. He quit after 30.00 years of use. He has never used smokeless tobacco. He reports current alcohol use of about 1.0 standard drinks of alcohol per week. He reports that he does not use drugs.   Family History:  The patient's family history includes Healthy in his sister; Heart disease in his mother, paternal grandfather, and paternal grandmother; Heart disease (age of onset: 60) in his father; Hypertension in his mother and paternal grandfather.    ROS:  Please see the history of present illness.   Otherwise, review of systems are positive for none.   All other systems are reviewed and negative.    PHYSICAL EXAM: VS:  BP 127/66   Pulse 74   Ht 6\' 3"  (1.905 m)   Wt  235 lb 9.6 oz (106.9 kg)   BMI 29.45 kg/m  , BMI Body mass index is 29.45 kg/m. GEN: Well nourished, well developed, in no acute distress HEENT: normal Neck: no JVD, carotid bruits, or masses Cardiac: IRRR; no murmurs, rubs, or gallops, 2+ bilateral LE edema L>R Respiratory:  clear to auscultation bilaterally, normal work of breathing GI: soft, nontender, nondistended, + BS MS: no deformity or atrophy Skin: warm and dry, no rash Neuro:  Strength and sensation are intact Psych: euthymic mood,  full affect   EKG:  EKG is not ordered today.   Recent Labs: 01/26/2018: TSH 4.958 01/29/2018: Magnesium 2.2 02/01/2018: ALT 49; BUN 21; Creatinine, Ser 0.82; Hemoglobin 12.9; Platelets 207; Potassium 4.1; Sodium 134    Lipid Panel    Component Value Date/Time   CHOL 135 01/22/2018 0347   TRIG 71 01/22/2018 0347   HDL 50 01/22/2018 0347   CHOLHDL 2.7 01/22/2018 0347   VLDL 14 01/22/2018 0347   LDLCALC 71 01/22/2018 0347      Wt Readings from Last 3 Encounters:  03/18/18 235 lb 9.6 oz (106.9 kg)  03/03/18 220 lb (99.8 kg)  02/26/18 220 lb (99.8 kg)      Other studies Reviewed: Additional studies/ records that were reviewed today include:   Echocardiogram 01/2018: Study Conclusions  - Left ventricle: The cavity size was normal. Wall thickness was   increased in a pattern of mild LVH. Systolic function was normal.   The estimated ejection fraction was in the range of 55% to 60%.   Wall motion was normal; there were no regional wall motion   abnormalities. The study was not technically sufficient to allow   evaluation of LV diastolic dysfunction due to atrial   fibrillation. - Aortic valve: There was no stenosis. - Aorta: Borderline dilated aortic root. Aortic root dimension: 37   mm (ED). - Mitral valve: Mildly calcified annulus. There was trivial   regurgitation. - Left atrium: The atrium was moderately dilated. - Right ventricle: The cavity size was normal. Systolic  function   was normal. - Right atrium: The atrium was mildly dilated. - Tricuspid valve: Peak RV-RA gradient (S): 35 mm Hg. - Pulmonary arteries: PA peak pressure: 38 mm Hg (S). - Inferior vena cava: The vessel was normal in size. The   respirophasic diameter changes were in the normal range (>= 50%),   consistent with normal central venous pressure.  Impressions:  - The paitent was in atrial fibrillation. Normal LV size with mild   LV hypertrophy. EF 55-60%. Normal RV size and systolic function.   Mild pulmonary hypertension.     ASSESSMENT AND PLAN:  1. 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 LLE edema likely contributed to by vein graft harvest for CABG. He is up 15lbs since his last visit with CT surgery 03/03/18. He has 2+ LE edema (L>R) on exam today but no other volume overload complaints. He denies LE pain or claudication. Echo 01/2018 with EF 55-60% but unable to assess LV diastolic function given underlying atrial fibrillation.  - Will start lasix 40mg  daily with potassium 20 mEq - suspect he would benefit from daily dosing as opposed to short courses given persistent issues.  - Recommended compression stockings and LE elevation  - We had a long discussion on importance of maintaining a low sodium diet - continue to encourage limiting salt intake to 1.5-2g per day.  - Recommended daily weights going forward - BMET ordered for next week for monitoring of Cr and potassium levels  2. CAD s/p CABG: He continues to do well from a post-operative standpoint. He is eager to start cardiac rehab next month. No anginal complaints - Continue aspirin and statin  3. Permanent atrial fibrillation: rate well controlled, not on rate limiting agents.  - Continue apixaban for stroke ppx   4. HTN: BP 127/66 today. Previously prescribed lisinopril has been on hold since CABG.  - Will start lasix  40mg  daily today -  Continue to hold lisinopril at this time  5. HLD: LDL 71 01/2018; goal <70 - Continue atorvastatin and omega 3  6. Insomnia: continues to be a significant issue for him, sleeping only 3-4 hours per night despite good bedtime habits and trials of ambien and melatonin.  - Encouraged him to follow-up with his PCP to determine alternative sleep aid medications   Current medicines are reviewed at length with the patient today.  The patient does not have concerns regarding medicines.  The following changes have been made:  Start lasix 40mg  daily and potassium 20 mEq daily  Labs/ tests ordered today include:   Orders Placed This Encounter  Procedures  . Basic metabolic panel     Disposition:   FU with Dr. Gwenlyn Found on 03/25/18 as previously scheduled   Signed, Abigail Butts, PA-C  03/18/2018 3:33 PM

## 2018-03-25 ENCOUNTER — Encounter: Payer: Self-pay | Admitting: Cardiovascular Disease

## 2018-03-25 ENCOUNTER — Ambulatory Visit: Payer: Medicare Other | Admitting: Cardiovascular Disease

## 2018-03-25 VITALS — BP 112/64 | HR 70 | Ht 75.0 in | Wt 229.0 lb

## 2018-03-25 DIAGNOSIS — I4821 Permanent atrial fibrillation: Secondary | ICD-10-CM | POA: Diagnosis not present

## 2018-03-25 DIAGNOSIS — I739 Peripheral vascular disease, unspecified: Secondary | ICD-10-CM | POA: Diagnosis not present

## 2018-03-25 DIAGNOSIS — I6523 Occlusion and stenosis of bilateral carotid arteries: Secondary | ICD-10-CM | POA: Diagnosis not present

## 2018-03-25 DIAGNOSIS — I2510 Atherosclerotic heart disease of native coronary artery without angina pectoris: Secondary | ICD-10-CM

## 2018-03-25 DIAGNOSIS — Z9861 Coronary angioplasty status: Secondary | ICD-10-CM

## 2018-03-25 DIAGNOSIS — R600 Localized edema: Secondary | ICD-10-CM

## 2018-03-25 DIAGNOSIS — I10 Essential (primary) hypertension: Secondary | ICD-10-CM

## 2018-03-25 LAB — BASIC METABOLIC PANEL
BUN / CREAT RATIO: 21 (ref 10–24)
BUN: 16 mg/dL (ref 8–27)
CHLORIDE: 98 mmol/L (ref 96–106)
CO2: 23 mmol/L (ref 20–29)
Calcium: 9.6 mg/dL (ref 8.6–10.2)
Creatinine, Ser: 0.76 mg/dL (ref 0.76–1.27)
GFR calc Af Amer: 103 mL/min/{1.73_m2} (ref >59)
GFR calc non Af Amer: 89 mL/min/{1.73_m2} (ref >59)
Glucose: 90 mg/dL (ref 65–99)
POTASSIUM: 5.2 mmol/L (ref 3.5–5.2)
SODIUM: 139 mmol/L (ref 134–144)

## 2018-03-25 NOTE — Assessment & Plan Note (Signed)
History of essential hypertension blood pressure measured at 112/64.  He was on lisinopril which has been held because of well-controlled blood pressure off the medication.

## 2018-03-25 NOTE — Progress Notes (Signed)
03/25/2018 [name]   [birth year]  [id number]  Primary Physician Lavone Orn, MD Primary Cardiologist: Lorretta Harp MD FACP, [place], [place], [place]  HPI:  [name] is a 78 y.o.  mildly overweight, married Caucasian male father of 2, grandfather of 3 grandchildren who I saw  01/18/2017. He has a history of CAD status post RCA stenting by myself January 11, 1998. He had normal circumflex, LAD and ejection fraction. His other problems include hypertension and chronic atrial fibrillation on Coumadin anticoagulation, rate controlled, as well as erectile dysfunction. He is totally asymptomatic. He did have colon cancer picked up on screening colonoscopy and underwent right colectomy May 29, 2006 followed by 11 cycles of adjuvant Folfox chemotherapy. [name] He is very active exercises 6 days a week doing weight training 3 days a week, and lower extremity training 3 times a week.[name]  He was admitted to the hospital on 01/21/2018 with non-STEMI.  He underwent cardiac catheterization 2 days later by Dr. Saunders Revel revealing left main disease but disease in his LAD and RCA as well.  He underwent CABG by Dr. Cyndia Bent on 01/28/2018 with a LIMA to his LAD, vein to an obtuse marginal branch and to the PDA and PLA sequentially.  He also had left atrial clipping at that time.  He is recuperating nicely and is planning on participating the cardiac rehab program later this month.  His major issue is with left lower extremity swelling which has improved with oral diuretics.  Current Meds  Medication Sig  . Alpha Lipoic Acid 200 MG CAPS Take 600 mg by mouth 2 (two) times daily.   [name] apixaban (ELIQUIS) 5 MG TABS tablet Take 1 tablet (5 mg total) by mouth 2 (two) times daily.  [name] aspirin 81 MG tablet Take 81 mg by mouth daily.  [name] atorvastatin (LIPITOR) 80 MG tablet Take 1 tablet (80 mg total) by mouth daily at 6 PM.  . cholecalciferol (VITAMIN D) 1000 UNITS tablet Take 1,000 Units by mouth daily.  . Coenzyme Q10 200  MG capsule Take 100 mg by mouth 2 (two) times daily.   . Cyanocobalamin (VITAMIN B 12 PO) Take 100 mg by mouth daily.   . Flaxseed, Linseed, (FLAX SEED OIL PO) Take 1,400 mg by mouth daily.  . furosemide (LASIX) 40 MG tablet Take 1 tablet (40 mg total) by mouth daily.  [name] lisinopril (PRINIVIL,ZESTRIL) 20 MG tablet Take 1 tablet (20 mg total) by mouth daily. HOLD UNTIL YOUR APPT WITH DR Glynda Jaeger  . magnesium oxide (MAG-OX) 400 MG tablet Take 400 mg by mouth daily.  . Omega-3 Fatty Acids (FISH OIL) 1200 MG CAPS Take 1,000 mg by mouth daily.   . potassium chloride 20 MEQ TBCR Take 20 mEq by mouth daily.  [name] pyridoxine (B-6) 200 MG tablet Take 200 mg by mouth daily.  . vitamin C (ASCORBIC ACID) 500 MG tablet Take 1,000 mg by mouth 2 (two) times daily.     No Known Allergies  Social History   Socioeconomic History  . Marital status: Married    Spouse name: [name]  . Number of children: 2  . Years of education: Not on file  . Highest education level: Not on file  Occupational History  . Occupation: retired    Fish farm manager: RETIRED  Social Needs  . Financial resource strain: Not on file  . Food insecurity:    Worry: Not on file    Inability: Not on file  . Transportation needs:  Medical: Not on file    Non-medical: Not on file  Tobacco Use  . Smoking status: Former Smoker    Years: 30.00    Last attempt to quit: 11/25/1993    Years since quitting: 24.3  . Smokeless tobacco: Never Used  Substance and Sexual Activity  . Alcohol use: Yes    Alcohol/week: 1.0 standard drinks    Types: 1 Cans of beer per week  . Drug use: No  . Sexual activity: Not on file  Lifestyle  . Physical activity:    Days per week: Not on file    Minutes per session: Not on file  . Stress: Not on file  Relationships  . Social connections:    Talks on phone: Not on file    Gets together: Not on file    Attends religious service: Not on file    Active member of club or organization: Not on file    Attends  meetings of clubs or organizations: Not on file    Relationship status: Not on file  . Intimate partner violence:    Fear of current or ex partner: Not on file    Emotionally abused: Not on file    Physically abused: Not on file    Forced sexual activity: Not on file  Other Topics Concern  . Not on file  Social History Narrative  . Not on file     Review of Systems: General: negative for chills, fever, night sweats or weight changes.  Cardiovascular: negative for chest pain, dyspnea on exertion, edema, orthopnea, palpitations, paroxysmal nocturnal dyspnea or shortness of breath Dermatological: negative for rash Respiratory: negative for cough or wheezing Urologic: negative for hematuria Abdominal: negative for nausea, vomiting, diarrhea, bright red blood per rectum, melena, or hematemesis Neurologic: negative for visual changes, syncope, or dizziness All other systems reviewed and are otherwise negative except as noted above.    Blood pressure 112/64, pulse 70, height 6\' 3"  (1.905 m), weight 229 lb (103.9 kg).  General appearance: alert and no distress Neck: no adenopathy, no carotid bruit, no JVD, supple, symmetrical, trachea midline and thyroid not enlarged, symmetric, no tenderness/mass/nodules Lungs: clear to auscultation bilaterally Heart: irregularly irregular rhythm Extremities: 2+ left lower extremity edema Pulses: 2+ and symmetric Skin: Skin color, texture, turgor normal. No rashes or lesions Neurologic: Alert and oriented X 3, normal strength and tone. Normal symmetric reflexes. Normal coordination and gait  EKG not performed today  ASSESSMENT AND PLAN:   Essential hypertension History of essential hypertension blood pressure measured at 112/64.  He was on lisinopril which has been held because of well-controlled blood pressure off the medication.  Permanent atrial fibrillation History of permanent atrial fibrillation rate controlled on Eliquis oral  anticoagulation.  He did have his left atrial appendage clip during his recent CABG operation by Dr. Cyndia Bent on 01/28/2018.  It is unclear to me whether he needs to continue on his Eliquis given this.  I will review with 1 of electrophysiologist and make a final determination.  Carotid stenosis, asymptomatic History of moderate bilateral ICA stenosis by duplex ultrasound 01/13/2018.  He is neurologically asymptomatic.  We will recheck this in 12 months.  CAD S/P percutaneous coronary angioplasty She has CAD status post RCA stenting by myself back in 1999.  He was admitted on 01/21/2018 with non-STEMI and underwent cardiac catheterization by Dr. Saunders Revel on 01/23/2018 revealing left main disease with LAD and RCA disease as well.  He underwent CABG x4 by Dr. Cyndia Bent on 01/28/2018  with a LIMA to his LAD, vein to the obtuse marginal branch 1 and sequential vein to the PDA and PLA.  His postop course was unremarkable.  He scheduled to start cardiac rehab later this month.  Dyslipidemia, goal LDL below 70 History of dyslipidemia on statin therapy with lipid profile performed 01/22/2018 revealing total cholesterol of 135, LDL 71 and HDL of 50.  Edema of leg Left lower extremity edema which has improved on oral diuretics.  Probably related to use of venous conduits from that side.      Lorretta Harp MD FACP,FACC,FAHA, [facility] 03/25/2018 10:29 AM

## 2018-03-25 NOTE — Assessment & Plan Note (Signed)
History of moderate bilateral ICA stenosis by duplex ultrasound 01/13/2018.  He is neurologically asymptomatic.  We will recheck this in 12 months.

## 2018-03-25 NOTE — Patient Instructions (Signed)
Medication Instructions:  Your physician has recommended you make the following change in your medication:   [name] LISINOPRIL. YOUR BLOOD PRESSURE HAS BEEN WELL-CONTROLLED WITHOUT THIS MEDICATION. If you need a refill on your cardiac medications before your next appointment, please call your pharmacy.   Lab work: none If you have labs (blood work) drawn today and your tests are completely normal, you will receive your results only by: [name] MyChart Message (if you have MyChart) OR . A paper copy in the mail If you have any lab test that is abnormal or we need to change your treatment, we will call you to review the results.  Testing/Procedures: Your physician has requested that you have a carotid duplex. This test is an ultrasound of the carotid arteries in your neck. It looks at blood flow through these arteries that supply the brain with blood. Allow one hour for this exam. There are no restrictions or special instructions.  October 2020  Follow-Up: At [facility], you and your health needs are our priority.  As part of our continuing mission to provide you with exceptional heart care, we have created designated Provider Care Teams.  These Care Teams include your primary Cardiologist (physician) and Advanced Practice Providers (APPs -  Physician Assistants and Nurse Practitioners) who all work together to provide you with the care you need, when you need it. You will need a follow up appointment in 6 months with [name] Ransom, PA and 12 months with [name].  Please call our office 2 months in advance to schedule each appointment.

## 2018-03-25 NOTE — Assessment & Plan Note (Signed)
History of permanent atrial fibrillation rate controlled on Eliquis oral anticoagulation.  He did have his left atrial appendage clip during his recent CABG operation by Dr. Cyndia Bent on 01/28/2018.  It is unclear to me whether he needs to continue on his Eliquis given this.  I will review with 1 of electrophysiologist and make a final determination.

## 2018-03-25 NOTE — Assessment & Plan Note (Addendum)
She has CAD status post RCA stenting by myself back in 1999.  He was admitted on 01/21/2018 with non-STEMI and underwent cardiac catheterization by Dr. Saunders Revel on 01/23/2018 revealing left main disease with LAD and RCA disease as well.  He underwent CABG x4 by Dr. Cyndia Bent on 01/28/2018 with a LIMA to his LAD, vein to the obtuse marginal branch 1 and sequential vein to the PDA and PLA.  His postop course was unremarkable.  He scheduled to start cardiac rehab later this month.

## 2018-03-25 NOTE — Assessment & Plan Note (Signed)
Left lower extremity edema which has improved on oral diuretics.  Probably related to use of venous conduits from that side.

## 2018-03-25 NOTE — Assessment & Plan Note (Signed)
History of dyslipidemia on statin therapy with lipid profile performed 01/22/2018 revealing total cholesterol of 135, LDL 71 and HDL of 50.

## 2018-03-26 ENCOUNTER — Other Ambulatory Visit: Payer: Self-pay | Admitting: Cardiovascular Disease

## 2018-03-31 ENCOUNTER — Telehealth (HOSPITAL_COMMUNITY): Payer: Self-pay | Admitting: Pharmacist

## 2018-04-03 NOTE — Telephone Encounter (Signed)
Cardiac Rehab - Pharmacy Resident Documentation   Patient unable to be reached after three call attempts. Please complete allergy verification and medication review during patient's cardiac rehab appointment.    Gwenlyn Found, Sherian Rein D PGY1 Pharmacy Resident  Phone [phone] 04/03/2018   11:32 AM

## 2018-04-07 NOTE — Progress Notes (Signed)
[name] 78 y.o. male DOB 1941-02-21 MRN [id number]       Nutrition  No diagnosis found. Past Medical History:  Diagnosis Date  . A-fib ([place])   . Allergy    Seasonal--pollen  . Anticoagulation goal of INR 2 to 3    on coumadin  . CAD (coronary artery disease) 1999   RCA stent  . Carotid stenosis, asymptomatic    moderate RT and mild LT with carotid dopplers 10/26/11  . Colon cancer ([place]) 04/2006   Stage III--T3 N1  . COPD (chronic obstructive pulmonary disease) ([place])   . H/O cardiovascular stress test 01/13/2010   low risk scan, similar to 2008 study  . H/O echocardiogram 05/23/2009   EF >55%, mild MR, Mild TR,   . Hand foot syndrome   . Hemorrhoids   . Hyperlipidemia   . Hypertension   . Neuropathy   . Permanent atrial fibrillation   . Psoriasis   . Tubular adenoma of colon    Meds reviewed.     Current Outpatient Medications (Cardiovascular):  .  atorvastatin (LIPITOR) 80 MG tablet, TAKE 1 TABLET AT 6PM. .  furosemide (LASIX) 40 MG tablet, Take 1 tablet (40 mg total) by mouth daily. [name]  lisinopril (PRINIVIL,ZESTRIL) 20 MG tablet, Take 1 tablet (20 mg total) by mouth daily. HOLD UNTIL YOUR APPT WITH DR Glynda Jaeger   Current Outpatient Medications (Analgesics):  .  aspirin 81 MG tablet, Take 81 mg by mouth daily.  Current Outpatient Medications (Hematological):  .  apixaban (ELIQUIS) 5 MG TABS tablet, Take 1 tablet (5 mg total) by mouth 2 (two) times daily. .  Cyanocobalamin (VITAMIN B 12 PO), Take 100 mg by mouth daily.   Current Outpatient Medications (Other):  [name]  Alpha Lipoic Acid 200 MG CAPS, Take 600 mg by mouth 2 (two) times daily.  .  cholecalciferol (VITAMIN D) 1000 UNITS tablet, Take 1,000 Units by mouth daily. .  Coenzyme Q10 200 MG capsule, Take 100 mg by mouth 2 (two) times daily.  .  Flaxseed, Linseed, (FLAX SEED OIL PO), Take 1,400 mg by mouth daily. .  magnesium oxide (MAG-OX) 400 MG tablet, Take 400 mg by mouth daily. .  Omega-3 Fatty Acids (FISH  OIL) 1200 MG CAPS, Take 1,000 mg by mouth daily.  .  potassium chloride 20 MEQ TBCR, Take 20 mEq by mouth daily. [name]  pyridoxine (B-6) 200 MG tablet, Take 200 mg by mouth daily. .  vitamin C (ASCORBIC ACID) 500 MG tablet, Take 1,000 mg by mouth 2 (two) times daily.   HT: Ht Readings from Last 1 Encounters:  03/25/18 6\' 3"  (1.905 m)    WT: Wt Readings from Last 5 Encounters:  03/25/18 229 lb (103.9 kg)  03/18/18 235 lb 9.6 oz (106.9 kg)  03/03/18 220 lb (99.8 kg)  02/26/18 220 lb (99.8 kg)  02/11/18 220 lb (99.8 kg)     BMI = 28.62  03/25/18   Current tobacco use? No       Labs:  Lipid Panel     Component Value Date/Time   CHOL 135 01/22/2018 0347   TRIG 71 01/22/2018 0347   HDL 50 01/22/2018 0347   CHOLHDL 2.7 01/22/2018 0347   VLDL 14 01/22/2018 0347   LDLCALC 71 01/22/2018 0347    Lab Results  Component Value Date   HGBA1C 5.3 01/22/2018   CBG (last 3)  No results for input(s): GLUCAP in the last 72 hours.  Nutrition Diagnosis ? Food-and nutrition-related knowledge  deficit related to lack of exposure to information as related to diagnosis of: ? CVD   Nutrition Goal(s):  ? To be determined  Plan:  Pt to attend nutrition classes ? Nutrition I ? Nutrition II ? Portion Distortion  Will provide client-centered nutrition education as part of interdisciplinary care.   Monitor and evaluate progress toward nutrition goal with team.  Laurina Bustle, MS, RD, LDN 04/07/2018 9:25 PM

## 2018-04-08 ENCOUNTER — Encounter (HOSPITAL_COMMUNITY)
Admission: RE | Admit: 2018-04-08 | Discharge: 2018-04-08 | Disposition: A | Discharge status: Home / Self Care | Payer: Medicare Other | Source: Ambulatory Visit | Attending: Cardiovascular Disease | Admitting: Cardiovascular Disease

## 2018-04-08 ENCOUNTER — Encounter (HOSPITAL_COMMUNITY): Payer: Self-pay

## 2018-04-08 VITALS — BP 136/83 | HR 71 | Ht 74.5 in | Wt 230.4 lb

## 2018-04-08 DIAGNOSIS — Z951 Presence of aortocoronary bypass graft: Secondary | ICD-10-CM | POA: Insufficient documentation

## 2018-04-08 DIAGNOSIS — I214 Non-ST elevation (NSTEMI) myocardial infarction: Secondary | ICD-10-CM | POA: Insufficient documentation

## 2018-04-08 HISTORY — DX: Cardiac arrhythmia, unspecified: I49.9

## 2018-04-08 NOTE — Progress Notes (Signed)
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  Alpha Lipoic Acid 200 MG CAPS, Take 600 mg by mouth 2 (two) times daily. , Disp: , Rfl:  .  apixaban (ELIQUIS) 5 MG TABS tablet, Take 1 tablet (5 mg total) by mouth 2 (two) times daily., Disp: 60 tablet, Rfl: 0 .  aspirin 81 MG tablet, Take 81 mg by mouth daily., Disp: , Rfl:  .  atorvastatin (LIPITOR) 80 MG tablet, TAKE 1 TABLET AT 6PM., Disp: 30 tablet, Rfl: 0 .  cholecalciferol (VITAMIN D) 1000 UNITS tablet, Take 1,000 Units by mouth daily., Disp: , Rfl:  .  Coenzyme Q10 200 MG capsule, Take 100 mg by mouth 2 (two) times daily. , Disp: , Rfl:  .  Cyanocobalamin (VITAMIN B 12 PO), Take 100 mg by mouth daily. , Disp: , Rfl:  .  Flaxseed, Linseed, (FLAX SEED OIL PO), Take 1,400 mg by mouth daily., Disp: , Rfl:  .  furosemide (LASIX) 40 MG tablet, Take 1 tablet (40 mg total) by mouth daily., Disp: 90 tablet, Rfl: 3 .  lisinopril (PRINIVIL,ZESTRIL) 20 MG tablet, Take 1 tablet (20 mg total) by mouth daily. HOLD UNTIL YOUR APPT WITH DR Glynda Jaeger, Disp: 30 tablet, Rfl: 1 .  magnesium oxide (MAG-OX) 400 MG tablet, Take 400 mg by mouth daily., Disp: , Rfl:  .  Omega-3 Fatty Acids (FISH OIL) 1200 MG CAPS, Take 1,000 mg by mouth daily. , Disp: , Rfl:  .  potassium chloride 20 MEQ TBCR, Take 20 mEq by mouth daily., Disp: 90 tablet, Rfl: 3 .  pyridoxine (B-6) 200 MG tablet, Take 200 mg by mouth daily., Disp: , Rfl:  .  vitamin C (ASCORBIC ACID)  500 MG tablet, Take 1,000 mg by mouth 2 (two) times daily., Disp: , Rfl:   Past Medical History: Past Medical History:  Diagnosis Date  . A-fib ([place])   . Allergy    Seasonal--pollen  . Anticoagulation goal of INR 2 to 3    on coumadin  . Arrhythmia   . CAD (coronary artery disease) 1999   RCA stent  . Carotid stenosis, asymptomatic    moderate RT and mild LT with carotid dopplers 10/26/11  . Colon cancer ([place]) 04/2006   Stage III--T3 N1  . COPD (chronic obstructive pulmonary disease) ([place])   . H/O cardiovascular stress test 01/13/2010   low risk scan, similar to 2008 study  . H/O echocardiogram 05/23/2009   EF >55%, mild MR, Mild TR,   . Hand foot syndrome   . Hemorrhoids   . Hyperlipidemia   . Hypertension   . Neuropathy   . Permanent atrial fibrillation   . Psoriasis   . Tubular adenoma of colon     Tobacco Use: Social History   Tobacco Use  Smoking Status Former Smoker  . Years: 30.00  . Last attempt to quit: 11/25/1993  . Years since quitting: 24.3  Smokeless Tobacco Never Used    Labs: Recent Review  Flowsheet Data    Labs for ITP Cardiac and Pulmonary Rehab Latest Ref Rng & Units 01/28/2018 01/28/2018 01/28/2018 01/28/2018 01/29/2018   Cholestrol 0 - 200 mg/dL - - - - -   LDLCALC 0 - 99 mg/dL - - - - -   HDL >40 mg/dL - - - - -   Trlycerides <150 mg/dL - - - - -   Hemoglobin A1c 4.8 - 5.6 % - - - - -   PHART 7.350 - 7.450 7.388 7.345(L) 7.347(L) - -   PCO2ART 32.0 - 48.0 mmHg 37.1 44.2 44.1 - -   HCO3 20.0 - 28.0 mmol/L 22.7 24.1 24.3 - -   TCO2 22 - 32 mmol/L 24 25 26 27 27    ACIDBASEDEF 0.0 - 2.0 mmol/L 2.0 2.0 2.0 - -   O2SAT % 98.0 98.0 99.0 - -      Capillary Blood Glucose: Lab Results  Component Value Date   GLUCAP 97 01/30/2018   GLUCAP 138 (H) 01/29/2018   GLUCAP 127 (H) 01/29/2018   GLUCAP 122 (H) 01/29/2018   GLUCAP 112 (H) 01/29/2018     Exercise Target Goals: Exercise Program Goal: Individual exercise prescription set using results  from initial 6 min walk test and THRR while considering  patient's activity barriers and safety.   Exercise Prescription Goal: Initial exercise prescription builds to 30-45 minutes a day of aerobic activity, 2-3 days per week.  Home exercise guidelines will be given to patient during program as part of exercise prescription that the participant will acknowledge.  Activity Barriers & Risk Stratification: Activity Barriers & Cardiac Risk Stratification - 04/08/18 1359      Activity Barriers & Cardiac Risk Stratification   Activity Barriers  None    Cardiac Risk Stratification  High       6 Minute Walk: 6 Minute Walk    Row Name 04/08/18 1456         6 Minute Walk   Phase  Initial     Distance  1528 feet     Walk Time  6 minutes     # of Rest Breaks  0     MPH  2.89     METS  3.22     RPE  11     VO2 Peak  11.26     Symptoms  Yes (comment)     Comments  left ankle pain     Resting HR  71 bpm     Resting BP  136/83     Resting Oxygen Saturation   97 %     Exercise Oxygen Saturation  during 6 min walk  96 %     Max Ex. HR  108 bpm     Max Ex. BP  146/82     2 Minute Post BP  140/83        Oxygen Initial Assessment:   Oxygen Re-Evaluation:   Oxygen Discharge (Final Oxygen Re-Evaluation):   Initial Exercise Prescription: Initial Exercise Prescription - 04/08/18 1400      Date of Initial Exercise RX and Referring Provider   Date  04/08/18    Referring Provider  [name], MD    Expected Discharge Date  07/14/18      Treadmill   MPH  2.5    Grade  0    Minutes  10    METs  2.91      Bike   Level  1.2    Minutes  10  METs  3.16      NuStep   Level  3    SPM  85    Minutes  10    METs  2.8      Prescription Details   Frequency (times per week)  3    Duration  Progress to 30 minutes of continuous aerobic without signs/symptoms of physical distress      Intensity   THRR 40-80% of Max Heartrate  58-116    Ratings of Perceived Exertion  11-13     Perceived Dyspnea  0-4      Progression   Progression  Continue to progress workloads to maintain intensity without signs/symptoms of physical distress.      Resistance Training   Training Prescription  Yes    Weight  4lbs    Reps  10-15       Perform Capillary Blood Glucose checks as needed.  Exercise Prescription Changes:   Exercise Comments:   Exercise Goals and Review: Exercise Goals    Row Name 04/08/18 1337             Exercise Goals   Increase Physical Activity  Yes       Intervention  Provide advice, education, support and counseling about physical activity/exercise needs.;Develop an individualized exercise prescription for aerobic and resistive training based on initial evaluation findings, risk stratification, comorbidities and participant's personal goals.       Expected Outcomes  Short Term: Attend rehab on a regular basis to increase amount of physical activity.       Increase Strength and Stamina  Yes       Intervention  Provide advice, education, support and counseling about physical activity/exercise needs.;Develop an individualized exercise prescription for aerobic and resistive training based on initial evaluation findings, risk stratification, comorbidities and participant's personal goals.       Expected Outcomes  Short Term: Increase workloads from initial exercise prescription for resistance, speed, and METs.       Able to understand and use rate of perceived exertion (RPE) scale  Yes       Intervention  Provide education and explanation on how to use RPE scale       Expected Outcomes  Short Term: Able to use RPE daily in rehab to express subjective intensity level;Long Term:  Able to use RPE to guide intensity level when exercising independently       Knowledge and understanding of Target Heart Rate Range (THRR)  Yes       Intervention  Provide education and explanation of THRR including how the numbers were predicted and where they are located for  reference       Expected Outcomes  Short Term: Able to state/look up THRR;Long Term: Able to use THRR to govern intensity when exercising independently;Short Term: Able to use daily as guideline for intensity in rehab       Able to check pulse independently  Yes       Intervention  Provide education and demonstration on how to check pulse in carotid and radial arteries.;Review the importance of being able to check your own pulse for safety during independent exercise       Expected Outcomes  Short Term: Able to explain why pulse checking is important during independent exercise;Long Term: Able to check pulse independently and accurately       Understanding of Exercise Prescription  Yes       Intervention  Provide education, explanation, and written materials on patient's individual  exercise prescription       Expected Outcomes  Short Term: Able to explain program exercise prescription;Long Term: Able to explain home exercise prescription to exercise independently          Exercise Goals Re-Evaluation :   Discharge Exercise Prescription (Final Exercise Prescription Changes):   Nutrition:  Target Goals: Understanding of nutrition guidelines, daily intake of sodium 1500mg , cholesterol 200mg , calories 30% from fat and 7% or less from saturated fats, daily to have 5 or more servings of fruits and vegetables.  Biometrics: Pre Biometrics - 04/08/18 1441      Pre Biometrics   Height  6' 2.5" (1.892 m)    Weight  104.5 kg    Waist Circumference  42.75 inches    Hip Circumference  42 inches    Waist to Hip Ratio  1.02 %    BMI (Calculated)  29.19    Triceps Skinfold  9 mm    % Body Fat  26.9 %    Grip Strength  44 kg    Flexibility  7 in    Single Leg Stand  1.88 seconds        Nutrition Therapy Plan and Nutrition Goals:   Nutrition Assessments:   Nutrition Goals Re-Evaluation:   Nutrition Goals Re-Evaluation:   Nutrition Goals Discharge (Final Nutrition Goals  Re-Evaluation):   Psychosocial: Target Goals: Acknowledge presence or absence of significant depression and/or stress, maximize coping skills, provide positive support system. Participant is able to verbalize types and ability to use techniques and skills needed for reducing stress and depression.  Initial Review & Psychosocial Screening: Initial Psych Review & Screening - 04/08/18 1532      Initial Review   Current issues with  None Identified      Family Dynamics   Good Support System?  Yes   Fraser Din has his wife for support     Barriers   Psychosocial barriers to participate in program  There are no identifiable barriers or psychosocial needs.      Screening Interventions   Interventions  Encouraged to exercise       Quality of Life Scores: Quality of Life - 04/08/18 1337      Quality of Life   Select  Quality of Life      Quality of Life Scores   Health/Function Pre  26.13 %    Socioeconomic Pre  27.36 %    Psych/Spiritual Pre  24.64 %    Family Pre  30 %    GLOBAL Pre  26.65 %      Scores of 19 and below usually indicate a poorer quality of life in these areas.  A difference of  2-3 points is a clinically meaningful difference.  A difference of 2-3 points in the total score of the Quality of Life Index has been associated with significant improvement in overall quality of life, self-image, physical symptoms, and general health in studies assessing change in quality of life.  PHQ-9: Recent Review Flowsheet Data    There is no flowsheet data to display.     Interpretation of Total Score  Total Score Depression Severity:  1-4 = Minimal depression, 5-9 = Mild depression, 10-14 = Moderate depression, 15-19 = Moderately severe depression, 20-27 = Severe depression   Psychosocial Evaluation and Intervention:   Psychosocial Re-Evaluation:   Psychosocial Discharge (Final Psychosocial Re-Evaluation):   Vocational Rehabilitation: Provide vocational rehab assistance to  qualifying candidates.   Vocational Rehab Evaluation & Intervention: Vocational Rehab - 04/08/18  1536      Initial Vocational Rehab Evaluation & Intervention   Assessment shows need for Vocational Rehabilitation  No       Education: Education Goals: Education classes will be provided on a weekly basis, covering required topics. Participant will state understanding/return demonstration of topics presented.  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  Instructors address importance of being able to find your pulse and how to count your pulse when at home without a heart monitor.  Patients get hands on experience counting their pulse with staff help and individually.   Heart Attack, Angina, and Risk Factor Modification:  -Group instruction provided by verbal instruction, video, and written materials to support subject.  Instructors address signs and symptoms of angina and heart attacks.    Also discuss risk factors for heart disease and how to make changes to improve heart health risk factors.   Functional Fitness:  -Group instruction provided by verbal instruction, demonstration, patient participation, and written materials to support subject.  Instructors address safety measures for doing things around the house.  Discuss how to get up and down off the floor, how to pick things up properly, how to safely get out of a chair without assistance, and balance training.   Meditation and Mindfulness:  -Group instruction provided by verbal instruction, patient participation, and written materials to support subject.   Instructor addresses importance of mindfulness and meditation practice to help reduce stress and improve awareness.  Instructor also leads participants through a meditation exercise.    Stretching for Flexibility and Mobility:  -Group instruction provided by verbal instruction, patient participation, and written materials to support subject.  Instructors lead participants through series of stretches that are designed to increase flexibility thus improving mobility.  These stretches are additional exercise for major muscle groups that are typically performed during regular warm up and cool down.   Hands Only CPR:  -Group verbal, video, and participation provides a basic overview of AHA guidelines for community CPR. Role-play of emergencies allow participants the opportunity to practice calling for help and chest compression technique with discussion of AED use.   Hypertension: -Group verbal and written instruction that provides a basic overview of hypertension including the most recent diagnostic guidelines, risk factor reduction with self-care instructions and medication management.    Nutrition I class: Heart Healthy Eating:  -Group instruction provided by PowerPoint slides, verbal discussion, and written materials to support subject matter. The instructor gives an explanation and review of the Therapeutic Lifestyle Changes diet recommendations, which includes a discussion on lipid goals, dietary fat, sodium, fiber, plant stanol/sterol esters, sugar, and the components of a well-balanced, healthy diet.   Nutrition II class: Lifestyle Skills:  -Group instruction provided by PowerPoint slides, verbal discussion, and written materials to support subject matter. The instructor gives an explanation and review of label reading, grocery shopping for heart health, heart healthy recipe modifications, and ways to make healthier choices when eating out.   Diabetes Question & Answer:  -Group  instruction provided by PowerPoint slides, verbal discussion, and written materials to support subject matter. The instructor gives an explanation and review of diabetes co-morbidities, pre- and post-prandial blood glucose goals,  pre-exercise blood glucose goals, signs, symptoms, and treatment of hypoglycemia and hyperglycemia, and foot care basics.   Diabetes Blitz:  -Group instruction provided by PowerPoint slides, verbal discussion, and written materials to support subject matter. The instructor gives an explanation and review of the physiology behind type 1 and type 2 diabetes, diabetes medications and rational behind using different medications, pre- and post-prandial blood glucose recommendations and Hemoglobin A1c goals, diabetes diet, and exercise including blood glucose guidelines for exercising safely.    Portion Distortion:  -Group instruction provided by PowerPoint slides, verbal discussion, written materials, and food models to support subject matter. The instructor gives an explanation of serving size versus portion size, changes in portions sizes over the last 20 years, and what consists of a serving from each food group.   Stress Management:  -Group instruction provided by verbal instruction, video, and written materials to support subject matter.  Instructors review role of stress in heart disease and how to cope with stress positively.     Exercising on Your Own:  -Group instruction provided by verbal instruction, power point, and written materials to support subject.  Instructors discuss benefits of exercise, components of exercise, frequency and intensity of exercise, and end points for exercise.  Also discuss use of nitroglycerin and activating EMS.  Review options of places to exercise outside of rehab.  Review guidelines for sex with heart disease.   Cardiac Drugs I:  -Group instruction provided by verbal instruction and written materials to support subject.  Instructor  reviews cardiac drug classes: antiplatelets, anticoagulants, beta blockers, and statins.  Instructor discusses reasons, side effects, and lifestyle considerations for each drug class.   Cardiac Drugs II:  -Group instruction provided by verbal instruction and written materials to support subject.  Instructor reviews cardiac drug classes: angiotensin converting enzyme inhibitors (ACE-I), angiotensin II receptor blockers (ARBs), nitrates, and calcium channel blockers.  Instructor discusses reasons, side effects, and lifestyle considerations for each drug class.   Anatomy and Physiology of the Circulatory System:  Group verbal and written instruction and models provide basic cardiac anatomy and physiology, with the coronary electrical and arterial systems. Review of: AMI, Angina, Valve disease, Heart Failure, Peripheral Artery Disease, Cardiac Arrhythmia, Pacemakers, and the ICD.   Other Education:  -Group or individual verbal, written, or video instructions that support the educational goals of the cardiac rehab program.   Holiday Eating Survival Tips:  -Group instruction provided by PowerPoint slides, verbal discussion, and written materials to support subject matter. The instructor gives patients tips, tricks, and techniques to help them not only survive but enjoy the holidays despite the onslaught of food that accompanies the holidays.   Knowledge Questionnaire Score: Knowledge Questionnaire Score - 04/08/18 1337      Knowledge Questionnaire Score   Pre Score  18/24       Core Components/Risk Factors/Patient Goals at Admission: Personal Goals and Risk Factors at Admission - 04/08/18 1449      Core Components/Risk Factors/Patient Goals on Admission    Weight Management  Yes;Weight Maintenance    Intervention  Weight Management: Develop a combined nutrition and exercise program designed to reach desired caloric intake, while maintaining appropriate intake of nutrient and fiber, sodium  and fats, and appropriate energy expenditure required for the weight goal.;Weight Management: Provide education and appropriate resources to help participant work on and attain dietary goals.;Weight Management/Obesity: Establish reasonable short term and long term weight goals.    Admit Weight  230 lb 6.1 oz (104.5 kg)  Expected Outcomes  Short Term: Continue to assess and modify interventions until short term weight is achieved;Long Term: Adherence to nutrition and physical activity/exercise program aimed toward attainment of established weight goal;Weight Maintenance: Understanding of the daily nutrition guidelines, which includes 25-35% calories from fat, 7% or less cal from saturated fats, less than 200mg  cholesterol, less than 1.5gm of sodium, & 5 or more servings of fruits and vegetables daily;Understanding recommendations for meals to include 15-35% energy as protein, 25-35% energy from fat, 35-60% energy from carbohydrates, less than 200mg  of dietary cholesterol, 20-35 gm of total fiber daily;Understanding of distribution of calorie intake throughout the day with the consumption of 4-5 meals/snacks    Hypertension  Yes    Intervention  Provide education on lifestyle modifcations including regular physical activity/exercise, weight management, moderate sodium restriction and increased consumption of fresh fruit, vegetables, and low fat dairy, alcohol moderation, and smoking cessation.;Monitor prescription use compliance.    Expected Outcomes  Short Term: Continued assessment and intervention until BP is < 140/25mm HG in hypertensive participants. < 130/55mm HG in hypertensive participants with diabetes, heart failure or chronic kidney disease.;Long Term: Maintenance of blood pressure at goal levels.    Lipids  Yes    Intervention  Provide education and support for participant on nutrition & aerobic/resistive exercise along with prescribed medications to achieve LDL 70mg , HDL >40mg .    Expected  Outcomes  Short Term: Participant states understanding of desired cholesterol values and is compliant with medications prescribed. Participant is following exercise prescription and nutrition guidelines.;Long Term: Cholesterol controlled with medications as prescribed, with individualized exercise RX and with personalized nutrition plan. Value goals: LDL < 70mg , HDL > 40 mg.    Stress  Yes    Intervention  Offer individual and/or small group education and counseling on adjustment to heart disease, stress management and health-related lifestyle change. Teach and support self-help strategies.;Refer participants experiencing significant psychosocial distress to appropriate mental health specialists for further evaluation and treatment. When possible, include family members and significant others in education/counseling sessions.    Expected Outcomes  Short Term: Participant demonstrates changes in health-related behavior, relaxation and other stress management skills, ability to obtain effective social support, and compliance with psychotropic medications if prescribed.;Long Term: Emotional wellbeing is indicated by absence of clinically significant psychosocial distress or social isolation.       Core Components/Risk Factors/Patient Goals Review:    Core Components/Risk Factors/Patient Goals at Discharge (Final Review):    ITP Comments: ITP Comments    Row Name 04/08/18 1333           ITP Comments  Medical Director- Dr. Fransico Him, MD          Comments: Fraser Din attended orientation from 1334 to 1507 to review rules and guidelines for program. Completed 6 minute walk test, Intitial ITP, and exercise prescription.  VSS. Telemetry-Atrial fibrillation. Patient did report having left ankle pain during his walk test. Mr Lowery says he has had some swelling in his left ankle since his surgery.Barnet Pall, RN,BSN 04/08/2018 3:52 PM

## 2018-04-08 NOTE — Progress Notes (Signed)
[name] 78 y.o. male DOB: 1940/07/11 MRN: [id number]      Nutrition Note  1. 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 01/21/18 NSTEMI    Past Medical History:  Diagnosis Date  . A-fib ([place])   . Allergy    Seasonal--pollen  . Anticoagulation goal of INR 2 to 3    on coumadin  . Arrhythmia   . CAD (coronary artery disease) 1999   RCA stent  . Carotid stenosis, asymptomatic    moderate RT and mild LT with carotid dopplers 10/26/11  . Colon cancer ([place]) 04/2006   Stage III--T3 N1  . COPD (chronic obstructive pulmonary disease) ([place])   . H/O cardiovascular stress test 01/13/2010   low risk scan, similar to 2008 study  . H/O echocardiogram 05/23/2009   EF >55%, mild MR, Mild TR,   . Hand foot syndrome   . Hemorrhoids   . Hyperlipidemia   . Hypertension   . Neuropathy   . Permanent atrial fibrillation   . Psoriasis   . Tubular adenoma of colon    Meds reviewed.    Current Outpatient Medications (Cardiovascular):  .  atorvastatin (LIPITOR) 80 MG tablet, TAKE 1 TABLET AT 6PM. .  furosemide (LASIX) 40 MG tablet, Take 1 tablet (40 mg total) by mouth daily. [name]  lisinopril (PRINIVIL,ZESTRIL) 20 MG tablet, Take 1 tablet (20 mg total) by mouth daily. HOLD UNTIL YOUR APPT WITH DR Glynda Jaeger   Current Outpatient Medications (Analgesics):  .  aspirin 81 MG tablet, Take 81 mg by mouth daily.  Current Outpatient Medications (Hematological):  .  apixaban (ELIQUIS) 5 MG TABS tablet, Take 1 tablet (5 mg total) by mouth 2 (two) times daily. .  Cyanocobalamin (VITAMIN B 12 PO), Take 100 mg by mouth daily.   Current Outpatient Medications (Other):  [name]  Alpha Lipoic Acid 200 MG CAPS, Take 600 mg by mouth 2 (two) times daily.  .  cholecalciferol (VITAMIN D) 1000 UNITS tablet, Take 1,000 Units by mouth daily. .  Coenzyme Q10 200 MG capsule, Take 100 mg by mouth 2 (two) times daily.  .  Flaxseed, Linseed, (FLAX SEED OIL PO), Take 1,400 mg by mouth daily. .  magnesium oxide (MAG-OX) 400 MG tablet, Take 400 mg by  mouth daily. .  Omega-3 Fatty Acids (FISH OIL) 1200 MG CAPS, Take 1,000 mg by mouth daily.  .  potassium chloride 20 MEQ TBCR, Take 20 mEq by mouth daily. [name]  pyridoxine (B-6) 200 MG tablet, Take 200 mg by mouth daily. .  vitamin C (ASCORBIC ACID) 500 MG tablet, Take 1,000 mg by mouth 2 (two) times daily.   HT: Ht Readings from Last 1 Encounters:  04/08/18 6' 2.5" (1.892 m)    WT: Wt Readings from Last 5 Encounters:  04/08/18 230 lb 6.1 oz (104.5 kg)  03/25/18 229 lb (103.9 kg)  03/18/18 235 lb 9.6 oz (106.9 kg)  03/03/18 220 lb (99.8 kg)  02/26/18 220 lb (99.8 kg)     Body mass index is 29.18 kg/m.   Current tobacco use? No  Labs:  Lipid Panel     Component Value Date/Time   CHOL 135 01/22/2018 0347   TRIG 71 01/22/2018 0347   HDL 50 01/22/2018 0347   CHOLHDL 2.7 01/22/2018 0347   VLDL 14 01/22/2018 0347   LDLCALC 71 01/22/2018 0347    Lab Results  Component Value Date   HGBA1C 5.3 01/22/2018   CBG (last 3)  No results for input(s): GLUCAP in the  last 72 hours.  Nutrition Note Spoke with pt. Nutrition plan and goals reviewed with pt. Pt is following Step 2 of the Therapeutic Lifestyle Changes diet. Pt wants to focus on using nutrition to fuel his workouts and maintain his functional strength. Wt loss tips reviewed (label reading, how to build a healthy plate, portion sizes, eating frequently across the day). Pt concerned that diabetes runs in his family. Discussed the differences between complex and refined carbs, recommended pt replace refined carbs with complex. Reviewed the benefits swapping in complex carbs and moderating portion sizes can have on managing blood glucose with patient. Per discussion, pt does not use canned/convenience foods often. Pt does not add salt to food. Pt does not eat out frequently. Pt expressed understanding of the information reviewed. Pt aware of nutrition education classes offered and would like to attend nutrition classes.  Nutrition  Diagnosis ? Food-and nutrition-related knowledge deficit related to lack of exposure to information as related to diagnosis of: ? CVD   Nutrition Intervention ? Pt's individual nutrition plan and goals reviewed with pt. ? Pt given handouts for: ? Nutrition I class ? Nutrition II class  ? Complex vs simple carbohydrates  Nutrition Goal(s):  ? Pt to identify and limit food sources of saturated fat, trans fat, refined carbohydrates and sodium  Plan:  ? Pt to attend nutrition classes ? Nutrition I ? Nutrition II ? Portion Distortion ? Will provide client-centered nutrition education as part of interdisciplinary care ? Monitor and evaluate progress toward nutrition goal with team.   Laurina Bustle, MS, RD, LDN 04/08/2018 4:08 PM

## 2018-04-09 ENCOUNTER — Telehealth (HOSPITAL_COMMUNITY): Payer: Self-pay | Admitting: Cardiac Rehabilitation

## 2018-04-09 NOTE — Telephone Encounter (Signed)
-----   Message from Lorretta Harp, MD sent at 04/08/2018  5:32 PM EST ----- Regarding: RE: cardiac rehab He can still have episodes of PAF ----- Message ----- From: Lowell Guitar, RN Sent: 04/08/2018   3:32 PM EST To: Lorretta Harp, MD Subject: cardiac rehab                                  Dear Dr. Gwenlyn Found,  Pt in atrial fibrillation controlled rate at cardiac rehab orientation today.   He is s/p atrial appendage clipping 01/28/2018.  Is the presence of afib expected for him at this time?  What is the expectation of sinus rhythm s/p appendage clipping?   Thank you, Andi Hence, RN, BSN Cardiac Pulmonary Rehab

## 2018-04-14 ENCOUNTER — Encounter (HOSPITAL_COMMUNITY)
Admission: RE | Admit: 2018-04-14 | Discharge: 2018-04-14 | Disposition: A | Discharge status: Home / Self Care | Payer: Medicare Other | Source: Ambulatory Visit | Attending: Cardiovascular Disease | Admitting: Cardiovascular Disease

## 2018-04-14 ENCOUNTER — Ambulatory Visit (HOSPITAL_COMMUNITY): Payer: Medicare Other

## 2018-04-14 ENCOUNTER — Encounter (HOSPITAL_COMMUNITY): Payer: Self-pay

## 2018-04-14 DIAGNOSIS — I214 Non-ST elevation (NSTEMI) myocardial infarction: Secondary | ICD-10-CM

## 2018-04-14 DIAGNOSIS — Z951 Presence of aortocoronary bypass graft: Secondary | ICD-10-CM

## 2018-04-14 NOTE — Progress Notes (Signed)
Daily Session Note  Patient Details  Name: [name] MRN: [id number] Date of Birth: 1940/05/10 Referring Provider:   Flowsheet Row CARDIAC REHAB PHASE II ORIENTATION from 04/08/2018 in [facility]  Referring Provider  [name], MD      Encounter Date: 04/14/2018  Check In: Session Check In - 04/14/18 1403    Check-In          Supervising physician immediately available to respond to emergencies  [facility] immediately available    Physician(s)  Dr Ree Kida    Location  [facility]    Staff Present  Andi Hence, RN, Marga Melnick, RN, Deland Pretty, MS, ACSM CEP, Exercise Physiologist    Medication changes reported      No    Fall or balance concerns reported     No    Tobacco Cessation  No Change    Warm-up and Cool-down  Performed as group-led instruction    Resistance Training Performed  Yes    VAD Patient?  No    PAD/SET Patient?  No        Pain Assessment          Currently in Pain?  No/denies           Capillary Blood Glucose: No results found for this or any previous visit (from the past 24 hour(s)).    Social History   Tobacco Use  Smoking Status Former Smoker  . Years: 30.00  . Last attempt to quit: 11/25/1993  . Years since quitting: 24.4  Smokeless Tobacco Never Used    Goals Met:  Exercise tolerated well  Goals Unmet:  Not Applicable  Comments: Pt started cardiac rehab today.  Pt tolerated light exercise without difficulty. VSS, telemetry-atrial fib.   asymptomatic.  Medication list reconciled. Pt denies barriers to medicaiton compliance.  PSYCHOSOCIAL ASSESSMENT:  PHQ-0. [name] Pt exhibits positive coping skills, hopeful outlook with supportive family. No psychosocial needs identified at this time, no psychosocial interventions necessary. Pt oriented to exercise equipment and routine. Understanding verbalized.   Andi Hence, RN, BSN Cardiac Pulmonary Rehab  04/14/18 4:02  PM     Dr. Fransico Him is Medical Director for Cardiac Rehab at [facility].

## 2018-04-16 ENCOUNTER — Ambulatory Visit (HOSPITAL_COMMUNITY): Payer: Medicare Other

## 2018-04-16 ENCOUNTER — Telehealth: Payer: Self-pay | Admitting: Cardiovascular Disease

## 2018-04-16 ENCOUNTER — Encounter (HOSPITAL_COMMUNITY)
Admission: RE | Admit: 2018-04-16 | Discharge: 2018-04-16 | Disposition: A | Discharge status: Home / Self Care | Payer: Medicare Other | Source: Ambulatory Visit | Attending: Cardiovascular Disease | Admitting: Cardiovascular Disease

## 2018-04-16 DIAGNOSIS — Z951 Presence of aortocoronary bypass graft: Secondary | ICD-10-CM

## 2018-04-16 DIAGNOSIS — I214 Non-ST elevation (NSTEMI) myocardial infarction: Secondary | ICD-10-CM

## 2018-04-16 NOTE — Progress Notes (Signed)
Daily Session Note  Patient Details  Name: [name] MRN: [id number] Date of Birth: [birth year] Referring Provider:   Flowsheet Row CARDIAC REHAB PHASE II ORIENTATION from 04/08/2018 in [facility]  Referring Provider  [name], MD      Encounter Date: 04/16/2018  Check In: Session Check In - 04/16/18 1419    Check-In          Supervising physician immediately available to respond to emergencies  [facility] immediately available    Physician(s)  Dr Waldron Labs    Location  [facility]    Staff Present  Barnet Pall, RN, Toma Deiters, RN, Deland Pretty, MS, ACSM CEP, Exercise Physiologist;Brittany Durene Fruits, BS, ACSM CEP, Exercise Physiologist    Medication changes reported      No    Fall or balance concerns reported     No    Tobacco Cessation  No Change    Warm-up and Cool-down  Performed as group-led instruction    Resistance Training Performed  No    VAD Patient?  No    PAD/SET Patient?  No        Pain Assessment          Currently in Pain?  No/denies           Capillary Blood Glucose: No results found for this or any previous visit (from the past 24 hour(s)).    Social History   Tobacco Use  Smoking Status Former Smoker  . Years: 30.00  . Last attempt to quit: 11/25/1993  . Years since quitting: 24.4  Smokeless Tobacco Never Used    Goals Met:  Exercise tolerated well  Goals Unmet:  Not Applicable  Comments: pt c/o left leg edema.  He feels it is related to his vein graft incision.  Left leg appears red, swollen and tender to touch. Approximately 2 centimeter scaly area on shin.  Pt weight up 2.2lb over past 2 days. Dr. Kennon Holter office made aware. LM with triage.  Dr. Earlene Plater office made aware.  RN deferred to card or PCP. Dr. [name] available to see pt this afternoon at 4:30.   Pt agreeable and verbalizes understanding of this plan.  Andi Hence, RN, BSN Cardiac Pulmonary  Rehab 04/16/18 3:33 PM   Dr. Fransico Him is Medical Director for Cardiac Rehab at [facility].

## 2018-04-16 NOTE — Telephone Encounter (Signed)
° ° °  Per [place], please call [phone]  1) How much weight have you gained and in what time span? Per [facility] - Cardiac Rehab, about 3lbs in 1wk  2) If swelling, where is the swelling located? Legs, left leg worse  3) Are you currently taking a fluid pill? YES  4) Are you currently SOB? NO  5) Do you have a log of your daily weights (if so, list)?   6) Have you gained 3 pounds in a day or 5 pounds in a week? 3lbs in about 1 wk  7) Have you traveled recently? NO

## 2018-04-16 NOTE — Telephone Encounter (Signed)
Returned call to [place] @ cardiac rehab. She reports he had a vein harvest in left leg. This leg is red, tender, swollen and she is concerned about cellulitis (not at incision site) just generalized. She reports patient has no acute issues other than LE edema and he has gained 3lbs in 1 week  Patient is scheduled to see PCP today @ 4pm per [facility]. Advised this is appropriate and PCP can reach out to Dr. Gwenlyn Found if needed.

## 2018-04-18 ENCOUNTER — Encounter (HOSPITAL_COMMUNITY): Payer: Medicare Other

## 2018-04-18 ENCOUNTER — Telehealth (HOSPITAL_COMMUNITY): Payer: Self-pay | Admitting: Internal Medicine

## 2018-04-18 ENCOUNTER — Ambulatory Visit (HOSPITAL_COMMUNITY): Payer: Medicare Other

## 2018-04-21 ENCOUNTER — Ambulatory Visit (HOSPITAL_COMMUNITY): Payer: Medicare Other

## 2018-04-21 ENCOUNTER — Encounter (HOSPITAL_COMMUNITY): Payer: Medicare Other

## 2018-04-23 ENCOUNTER — Ambulatory Visit (HOSPITAL_COMMUNITY): Payer: Medicare Other

## 2018-04-23 ENCOUNTER — Encounter (HOSPITAL_COMMUNITY): Payer: Medicare Other

## 2018-04-24 NOTE — Progress Notes (Signed)
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  Alpha Lipoic Acid 200 MG CAPS, Take 600 mg by mouth 2 (two) times daily. , Disp: , Rfl:  .  apixaban (ELIQUIS) 5 MG TABS tablet, Take 1 tablet (5 mg total) by mouth 2 (two) times daily., Disp: 60 tablet, Rfl: 0 .  aspirin 81 MG tablet, Take 81 mg by mouth daily., Disp: , Rfl:  .  atorvastatin (LIPITOR) 80 MG tablet, TAKE 1 TABLET AT 6PM., Disp: 30 tablet, Rfl: 0 .  cholecalciferol (VITAMIN D) 1000 UNITS tablet, Take 1,000 Units by mouth daily., Disp: , Rfl:  .  Coenzyme Q10 200 MG capsule, Take 100 mg by mouth 2 (two) times daily. , Disp: , Rfl:  .  Cyanocobalamin (VITAMIN B 12 PO), Take 100 mg by mouth daily. , Disp: , Rfl:  .  Flaxseed, Linseed, (FLAX SEED OIL PO), Take 1,400 mg by mouth daily., Disp: , Rfl:  .  furosemide (LASIX) 40 MG tablet, Take 1 tablet (40 mg total) by mouth daily., Disp: 90 tablet, Rfl: 3 .  lisinopril (PRINIVIL,ZESTRIL) 20 MG tablet, Take 1 tablet (20 mg total) by mouth daily. HOLD UNTIL YOUR APPT WITH DR Glynda Jaeger, Disp: 30 tablet, Rfl: 1 .  magnesium oxide (MAG-OX) 400 MG tablet, Take 400 mg by mouth daily., Disp: , Rfl:  .  Omega-3 Fatty Acids (FISH OIL) 1200 MG CAPS, Take 1,000 mg by mouth daily. , Disp: , Rfl:  .  potassium chloride 20 MEQ TBCR, Take 20 mEq by mouth daily., Disp: 90 tablet, Rfl: 3 .  pyridoxine (B-6) 200 MG tablet, Take 200 mg by mouth daily., Disp: , Rfl:  .   vitamin C (ASCORBIC ACID) 500 MG tablet, Take 1,000 mg by mouth 2 (two) times daily., Disp: , Rfl:   Past Medical History: Past Medical History:  Diagnosis Date  . A-fib ([place])   . Allergy    Seasonal--pollen  . Anticoagulation goal of INR 2 to 3    on coumadin  . Arrhythmia   . CAD (coronary artery disease) 1999   RCA stent  . Carotid stenosis, asymptomatic    moderate RT and mild LT with carotid dopplers 10/26/11  . Colon cancer ([place]) 04/2006   Stage III--T3 N1  . COPD (chronic obstructive pulmonary disease) ([place])   . H/O cardiovascular stress test 01/13/2010   low risk scan, similar to 2008 study  . H/O echocardiogram 05/23/2009   EF >55%, mild MR, Mild TR,   . Hand foot syndrome   . Hemorrhoids   . Hyperlipidemia   . Hypertension   . Neuropathy   . Permanent atrial fibrillation   . Psoriasis   . Tubular adenoma of colon     Tobacco Use: Social History   Tobacco Use  Smoking Status Former Smoker  . Years: 30.00  . Last attempt to quit: 11/25/1993  . Years since quitting: 24.4  Smokeless Tobacco Never Used    Labs: Recent Review  Flowsheet Data    Labs for ITP Cardiac and Pulmonary Rehab Latest Ref Rng & Units 01/28/2018 01/28/2018 01/28/2018 01/28/2018 01/29/2018   Cholestrol 0 - 200 mg/dL - - - - -   LDLCALC 0 - 99 mg/dL - - - - -   HDL >40 mg/dL - - - - -   Trlycerides <150 mg/dL - - - - -   Hemoglobin A1c 4.8 - 5.6 % - - - - -   PHART 7.350 - 7.450 7.388 7.345(L) 7.347(L) - -   PCO2ART 32.0 - 48.0 mmHg 37.1 44.2 44.1 - -   HCO3 20.0 - 28.0 mmol/L 22.7 24.1 24.3 - -   TCO2 22 - 32 mmol/L _0 ACIDBASEDEF 0.0 - 2.0 mmol/L 2.0 2.0 2.0 - -   O2SAT % 98.0 98.0 99.0 - -      Capillary Blood Glucose: Lab Results  Component Value Date   GLUCAP 97 01/30/2018   GLUCAP 138 (H) 01/29/2018   GLUCAP 127 (H) 01/29/2018   GLUCAP 122 (H) 01/29/2018   GLUCAP 112 (H) 01/29/2018     Exercise Target Goals: Exercise Program Goal: Individual exercise  prescription set using results from initial 6 min walk test and THRR while considering  patient's activity barriers and safety.   Exercise Prescription Goal: Initial exercise prescription builds to 30-45 minutes a day of aerobic activity, 2-3 days per week.  Home exercise guidelines will be given to patient during program as part of exercise prescription that the participant will acknowledge.  Activity Barriers & Risk Stratification: Activity Barriers & Cardiac Risk Stratification - 04/08/18 1359    Activity Barriers & Cardiac Risk Stratification          Activity Barriers  None    Cardiac Risk Stratification  High           6 Minute Walk: 6 Minute Walk    6 Minute Walk    Row Name 04/08/18 1456   Phase  Initial   Distance  1528 feet   Walk Time  6 minutes   # of Rest Breaks  0   MPH  2.89   METS  3.22   RPE  11   VO2 Peak  11.26   Symptoms  Yes (comment)   Comments  left ankle pain   Resting HR  71 bpm   Resting BP  136/83   Resting Oxygen Saturation   97 %   Exercise Oxygen Saturation  during 6 min walk  96 %   Max Ex. HR  108 bpm   Max Ex. BP  146/82   2 Minute Post BP  140/83          Oxygen Initial Assessment:   Oxygen Re-Evaluation:   Oxygen Discharge (Final Oxygen Re-Evaluation):   Initial Exercise Prescription: Initial Exercise Prescription - 04/08/18 1400    Date of Initial Exercise RX and Referring Provider          Date  04/08/18    Referring Provider  [name], MD    Expected Discharge Date  07/14/18        Treadmill          MPH  2.5    Grade  0    Minutes  10    METs  2.91        Bike          Level  1.2    Minutes  10    METs  3.16        NuStep          Level  3    SPM  85    Minutes  10    METs  2.8        Prescription Details          Frequency (times per week)  3    Duration  Progress to 30 minutes of continuous aerobic without signs/symptoms of physical distress        Intensity          THRR 40-80%  of Max Heartrate  58-116    Ratings of Perceived Exertion  11-13    Perceived Dyspnea  0-4        Progression          Progression  Continue to progress workloads to maintain intensity without signs/symptoms of physical distress.        Resistance Training          Training Prescription  Yes    Weight  4lbs    Reps  10-15           Perform Capillary Blood Glucose checks as needed.  Exercise Prescription Changes: Exercise Prescription Changes    Response to Exercise    Row Name 04/21/18 1500   Blood Pressure (Admit)  120/64   Blood Pressure (Exercise)  132/80   Blood Pressure (Exit)  120/68   Heart Rate (Admit)  91 bpm   Heart Rate (Exercise)  142 bpm   Heart Rate (Exit)  82 bpm   Rating of Perceived Exertion (Exercise)  11   Symptoms  Left leg pain.    Duration  Progress to 30 minutes of  aerobic without signs/symptoms of physical distress       Progression    Row Name 04/21/18 1500   Progression  Continue to progress workloads to maintain intensity without signs/symptoms of physical distress.   Average METs  2.9       Resistance Training    Row Name 04/21/18 1500   Training Prescription  No       Treadmill    Row Name 04/21/18 1500   MPH  2.5   Grade  0   Minutes  10   METs  2.91       Bike    Row Name 04/21/18 1500   Level  1.2   Minutes  10   METs  3.16       NuStep    Row Name 04/21/18 1500   Level  3   SPM  85   Minutes  10   METs  2.7          Exercise Comments: Exercise Comments    Row Name 04/24/18 1101   Exercise Comments  Pt has been absent form the program. Will follow up with Pt.       Exercise Goals and Review: Exercise Goals    Exercise Goals    Row Name 04/08/18 1337   Increase Physical Activity  Yes   Intervention  Provide advice, education, support and counseling about physical activity/exercise needs.;Develop an individualized exercise prescription for aerobic and resistive training based on initial evaluation  findings, risk stratification, comorbidities and participant's personal goals.   Expected Outcomes  Short Term: Attend rehab on a regular basis to increase amount of physical activity.   Increase Strength and Stamina  Yes   Intervention  Provide advice, education, support and counseling about physical activity/exercise needs.;Develop  an individualized exercise prescription for aerobic and resistive training based on initial evaluation findings, risk stratification, comorbidities and participant's personal goals.   Expected Outcomes  Short Term: Increase workloads from initial exercise prescription for resistance, speed, and METs.   Able to understand and use rate of perceived exertion (RPE) scale  Yes   Intervention  Provide education and explanation on how to use RPE scale   Expected Outcomes  Short Term: Able to use RPE daily in rehab to express subjective intensity level;Long Term:  Able to use RPE to guide intensity level when exercising independently   Knowledge and understanding of Target Heart Rate Range (THRR)  Yes   Intervention  Provide education and explanation of THRR including how the numbers were predicted and where they are located for reference   Expected Outcomes  Short Term: Able to state/look up THRR;Long Term: Able to use THRR to govern intensity when exercising independently;Short Term: Able to use daily as guideline for intensity in rehab   Able to check pulse independently  Yes   Intervention  Provide education and demonstration on how to check pulse in carotid and radial arteries.;Review the importance of being able to check your own pulse for safety during independent exercise   Expected Outcomes  Short Term: Able to explain why pulse checking is important during independent exercise;Long Term: Able to check pulse independently and accurately   Understanding of Exercise Prescription  Yes   Intervention  Provide education, explanation, and written materials on patient's  individual exercise prescription   Expected Outcomes  Short Term: Able to explain program exercise prescription;Long Term: Able to explain home exercise prescription to exercise independently          Exercise Goals Re-Evaluation : Exercise Goals Re-Evaluation    Exercise Goal Re-Evaluation    Row Name 04/24/18 1058   Exercise Goals Review  Increase Physical Activity;Increase Strength and Stamina;Able to understand and use rate of perceived exertion (RPE) scale;Knowledge and understanding of Target Heart Rate Range (THRR);Understanding of Exercise Prescription   Comments  Pt has been absent form the program. Pt has completed two sessions and has a MET level of 2.9. Will follow up with Pt.    Expected Outcomes  Will continue to monitor and progress Pt as tolerated.           Discharge Exercise Prescription (Final Exercise Prescription Changes): Exercise Prescription Changes - 04/21/18 1500    Response to Exercise          Blood Pressure (Admit)  120/64    Blood Pressure (Exercise)  132/80    Blood Pressure (Exit)  120/68    Heart Rate (Admit)  91 bpm    Heart Rate (Exercise)  142 bpm    Heart Rate (Exit)  82 bpm    Rating of Perceived Exertion (Exercise)  11    Symptoms  Left leg pain.     Duration  Progress to 30 minutes of  aerobic without signs/symptoms of physical distress        Progression          Progression  Continue to progress workloads to maintain intensity without signs/symptoms of physical distress.    Average METs  2.9        Resistance Training          Training Prescription  No        Treadmill          MPH  2.5    Grade  0  Minutes  10    METs  2.91        Bike          Level  1.2    Minutes  10    METs  3.16        NuStep          Level  3    SPM  85    Minutes  10    METs  2.7           Nutrition:  Target Goals: Understanding of nutrition guidelines, daily intake of sodium <1540m, cholesterol <2057m calories 30% from fat  and 7% or less from saturated fats, daily to have 5 or more servings of fruits and vegetables.  Biometrics: Pre Biometrics - 04/08/18 1441    Pre Biometrics          Height  6' 2.5" (1.892 m)    Weight  104.5 kg    Waist Circumference  42.75 inches    Hip Circumference  42 inches    Waist to Hip Ratio  1.02 %    BMI (Calculated)  29.19    Triceps Skinfold  9 mm    % Body Fat  26.9 %    Grip Strength  44 kg    Flexibility  7 in    Single Leg Stand  1.88 seconds            Nutrition Therapy Plan and Nutrition Goals: Nutrition Therapy & Goals - 04/08/18 1608    Nutrition Therapy          Diet  heart healthy        Personal Nutrition Goals          Nutrition Goal  Pt to identify and limit food sources of saturated fat, trans fat, refined carbohydrates and sodium        Intervention Plan          Intervention  Prescribe, educate and counsel regarding individualized specific dietary modifications aiming towards targeted core components such as weight, hypertension, lipid management, diabetes, heart failure and other comorbidities.    Expected Outcomes  Short Term Goal: Understand basic principles of dietary content, such as calories, fat, sodium, cholesterol and nutrients.;Long Term Goal: Adherence to prescribed nutrition plan.           Nutrition Assessments: Nutrition Assessments - 04/08/18 1612    MEDFICTS Scores          Pre Score  15           Nutrition Goals Re-Evaluation: Nutrition Goals Re-Evaluation    Goals    Row Name 04/08/18 1608   Current Weight  230 lb 6.1 oz (104.5 kg)          Nutrition Goals Re-Evaluation: Nutrition Goals Re-Evaluation    Goals    Row Name 04/08/18 1608   Current Weight  230 lb 6.1 oz (104.5 kg)          Nutrition Goals Discharge (Final Nutrition Goals Re-Evaluation): Nutrition Goals Re-Evaluation - 04/08/18 1608    Goals          Current Weight  230 lb 6.1 oz (104.5 kg)           Psychosocial: Target  Goals: Acknowledge presence or absence of significant depression and/or stress, maximize coping skills, provide positive support system. Participant is able to verbalize types and ability to use techniques and skills needed for reducing stress and depression.  Initial Review & Psychosocial Screening:  Initial Psych Review & Screening - 04/08/18 1532    Initial Review          Current issues with  None Identified        Family Dynamics          Good Support System?  Yes   Fraser Din has his wife for support       Barriers          Psychosocial barriers to participate in program  There are no identifiable barriers or psychosocial needs.        Screening Interventions          Interventions  Encouraged to exercise           Quality of Life Scores: Quality of Life - 04/08/18 1337    Quality of Life          Select  Quality of Life        Quality of Life Scores          Health/Function Pre  26.13 %    Socioeconomic Pre  27.36 %    Psych/Spiritual Pre  24.64 %    Family Pre  30 %    GLOBAL Pre  26.65 %          Scores of 19 and below usually indicate a poorer quality of life in these areas.  A difference of  2-3 points is a clinically meaningful difference.  A difference of 2-3 points in the total score of the Quality of Life Index has been associated with significant improvement in overall quality of life, self-image, physical symptoms, and general health in studies assessing change in quality of life.  PHQ-9: Recent Review Flowsheet Data    Depression screen [facility] 2/9 04/14/2018   Decreased Interest 0   Down, Depressed, Hopeless 0   PHQ - 2 Score 0     Interpretation of Total Score  Total Score Depression Severity:  1-4 = Minimal depression, 5-9 = Mild depression, 10-14 = Moderate depression, 15-19 = Moderately severe depression, 20-27 = Severe depression   Psychosocial Evaluation and Intervention: Psychosocial Evaluation - 04/14/18 1528    Psychosocial Evaluation &  Interventions          Interventions  Encouraged to exercise with the program and follow exercise prescription    Comments  no psychosocial needs identified, no interventions necessary.  pt enjoys woodwokring, fishing, riding trail bikes, reading and cooking.      Expected Outcomes  pt will exhibit positive outlook with good coping skills.     Continue Psychosocial Services   No Follow up required           Psychosocial Re-Evaluation: Psychosocial Re-Evaluation    Psychosocial Re-Evaluation    [place] Name 04/24/18 1006   Current issues with  None Identified   Comments  no psychosocial needs identified, no interventions necessary.    Expected Outcomes  pt will exhibit positive outlook with good coping skills.    Interventions  Encouraged to attend Cardiac Rehabilitation for the exercise   Continue Psychosocial Services   No Follow up required          Psychosocial Discharge (Final Psychosocial Re-Evaluation): Psychosocial Re-Evaluation - 04/24/18 1006    Psychosocial Re-Evaluation          Current issues with  None Identified    Comments  no psychosocial needs identified, no interventions necessary.     Expected Outcomes  pt will exhibit positive outlook with good coping skills.  Interventions  Encouraged to attend Cardiac Rehabilitation for the exercise    Continue Psychosocial Services   No Follow up required           Vocational Rehabilitation: Provide vocational rehab assistance to qualifying candidates.   Vocational Rehab Evaluation & Intervention: Vocational Rehab - 04/08/18 1536    Initial Vocational Rehab Evaluation & Intervention          Assessment shows need for Vocational Rehabilitation  No           Education: Education Goals: Education classes will be provided on a weekly basis, covering required topics. Participant will state understanding/return demonstration of topics presented.  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  Instructors address importance of being able to find your pulse and how to count your pulse when at home without a heart monitor.  Patients get hands on experience counting their pulse with staff help and individually.   Heart Attack, Angina, and Risk Factor Modification:  -Group instruction provided by verbal instruction, video, and written materials to support subject.  Instructors address signs and symptoms of angina and heart attacks.    Also discuss risk factors for heart disease and how to make changes to improve heart health risk factors.   Functional Fitness:  -Group instruction provided by verbal instruction, demonstration, patient participation, and written materials to support subject.  Instructors address safety measures for doing things around the house.  Discuss how to get up and down off the floor, how to pick things up properly, how to safely get out of a chair without assistance, and balance training.   Meditation and Mindfulness:  -Group instruction provided by verbal instruction, patient participation, and written materials to support subject.  Instructor addresses importance of mindfulness and meditation practice to help reduce stress and improve awareness.  Instructor also leads participants through a meditation exercise.    Stretching for Flexibility and Mobility:  -Group instruction provided by verbal instruction, patient participation, and written materials to support subject.  Instructors lead participants through series of stretches that are designed to increase flexibility thus improving mobility.   These stretches are additional exercise for major muscle groups that are typically performed during regular warm up and cool down.   Hands Only CPR:  -Group verbal, video, and participation provides a basic overview of AHA guidelines for community CPR. Role-play of emergencies allow participants the opportunity to practice calling for help and chest compression technique with discussion of AED use.   Hypertension: -Group verbal and written instruction that provides a basic overview of hypertension including the most recent diagnostic guidelines, risk factor reduction with self-care instructions and medication management.    Nutrition I class: Heart Healthy Eating:  -Group instruction provided by PowerPoint slides, verbal discussion, and written materials to support subject matter. The instructor gives an explanation and review of the Therapeutic Lifestyle Changes diet recommendations, which includes a discussion on lipid goals, dietary fat, sodium, fiber, plant stanol/sterol esters, sugar, and the components of a well-balanced, healthy diet.   Nutrition II class: Lifestyle Skills:  -Group instruction provided by PowerPoint slides, verbal discussion, and written materials  to support subject matter. The instructor gives an explanation and review of label reading, grocery shopping for heart health, heart healthy recipe modifications, and ways to make healthier choices when eating out.   Diabetes Question & Answer:  -Group instruction provided by PowerPoint slides, verbal discussion, and written materials to support subject matter. The instructor gives an explanation and review of diabetes co-morbidities, pre- and post-prandial blood glucose goals, pre-exercise blood glucose goals, signs, symptoms, and treatment of hypoglycemia and hyperglycemia, and foot care basics.   Diabetes Blitz:  -Group instruction provided by PowerPoint slides, verbal discussion, and written materials to support subject  matter. The instructor gives an explanation and review of the physiology behind type 1 and type 2 diabetes, diabetes medications and rational behind using different medications, pre- and post-prandial blood glucose recommendations and Hemoglobin A1c goals, diabetes diet, and exercise including blood glucose guidelines for exercising safely.    Portion Distortion:  -Group instruction provided by PowerPoint slides, verbal discussion, written materials, and food models to support subject matter. The instructor gives an explanation of serving size versus portion size, changes in portions sizes over the last 20 years, and what consists of a serving from each food group.   Stress Management:  -Group instruction provided by verbal instruction, video, and written materials to support subject matter.  Instructors review role of stress in heart disease and how to cope with stress positively.     Exercising on Your Own:  -Group instruction provided by verbal instruction, power point, and written materials to support subject.  Instructors discuss benefits of exercise, components of exercise, frequency and intensity of exercise, and end points for exercise.  Also discuss use of nitroglycerin and activating EMS.  Review options of places to exercise outside of rehab.  Review guidelines for sex with heart disease.   Cardiac Drugs I:  -Group instruction provided by verbal instruction and written materials to support subject.  Instructor reviews cardiac drug classes: antiplatelets, anticoagulants, beta blockers, and statins.  Instructor discusses reasons, side effects, and lifestyle considerations for each drug class.   Cardiac Drugs II:  -Group instruction provided by verbal instruction and written materials to support subject.  Instructor reviews cardiac drug classes: angiotensin converting enzyme inhibitors (ACE-I), angiotensin II receptor blockers (ARBs), nitrates, and calcium channel blockers.  Instructor  discusses reasons, side effects, and lifestyle considerations for each drug class.   Anatomy and Physiology of the Circulatory System:  Group verbal and written instruction and models provide basic cardiac anatomy and physiology, with the coronary electrical and arterial systems. Review of: AMI, Angina, Valve disease, Heart Failure, Peripheral Artery Disease, Cardiac Arrhythmia, Pacemakers, and the ICD.   Other Education:  -Group or individual verbal, written, or video instructions that support the educational goals of the cardiac rehab program.   Holiday Eating Survival Tips:  -Group instruction provided by PowerPoint slides, verbal discussion, and written materials to support subject matter. The instructor gives patients tips, tricks, and techniques to help them not only survive but enjoy the holidays despite the onslaught of food that accompanies the holidays.   Knowledge Questionnaire Score: Knowledge Questionnaire Score - 04/08/18 1337    Knowledge Questionnaire Score          Pre Score  18/24           Core Components/Risk Factors/Patient Goals at Admission: Personal Goals and Risk Factors at Admission - 04/08/18 1449    Core Components/Risk Factors/Patient Goals on Admission  Weight Management  Yes;Weight Maintenance    Intervention  Weight Management: Develop a combined nutrition and exercise program designed to reach desired caloric intake, while maintaining appropriate intake of nutrient and fiber, sodium and fats, and appropriate energy expenditure required for the weight goal.;Weight Management: Provide education and appropriate resources to help participant work on and attain dietary goals.;Weight Management/Obesity: Establish reasonable short term and long term weight goals.    Admit Weight  230 lb 6.1 oz (104.5 kg)    Expected Outcomes  Short Term: Continue to assess and modify interventions until short term weight is achieved;Long Term: Adherence to nutrition  and physical activity/exercise program aimed toward attainment of established weight goal;Weight Maintenance: Understanding of the daily nutrition guidelines, which includes 25-35% calories from fat, 7% or less cal from saturated fats, less than 243m cholesterol, less than 1.5gm of sodium, & 5 or more servings of fruits and vegetables daily;Understanding recommendations for meals to include 15-35% energy as protein, 25-35% energy from fat, 35-60% energy from carbohydrates, less than 2041mof dietary cholesterol, 20-35 gm of total fiber daily;Understanding of distribution of calorie intake throughout the day with the consumption of 4-5 meals/snacks    Hypertension  Yes    Intervention  Provide education on lifestyle modifcations including regular physical activity/exercise, weight management, moderate sodium restriction and increased consumption of fresh fruit, vegetables, and low fat dairy, alcohol moderation, and smoking cessation.;Monitor prescription use compliance.    Expected Outcomes  Short Term: Continued assessment and intervention until BP is < 140/902mG in hypertensive participants. < 130/71m74m in hypertensive participants with diabetes, heart failure or chronic kidney disease.;Long Term: Maintenance of blood pressure at goal levels.    Lipids  Yes    Intervention  Provide education and support for participant on nutrition & aerobic/resistive exercise along with prescribed medications to achieve LDL <70mg58mL >40mg.28mExpected Outcomes  Short Term: Participant states understanding of desired cholesterol values and is compliant with medications prescribed. Participant is following exercise prescription and nutrition guidelines.;Long Term: Cholesterol controlled with medications as prescribed, with individualized exercise RX and with personalized nutrition plan. Value goals: LDL < 70mg, 32m> 40 mg.    Stress  Yes    Intervention  Offer individual and/or small group education and counseling on  adjustment to heart disease, stress management and health-related lifestyle change. Teach and support self-help strategies.;Refer participants experiencing significant psychosocial distress to appropriate mental health specialists for further evaluation and treatment. When possible, include family members and significant others in education/counseling sessions.    Expected Outcomes  Short Term: Participant demonstrates changes in health-related behavior, relaxation and other stress management skills, ability to obtain effective social support, and compliance with psychotropic medications if prescribed.;Long Term: Emotional wellbeing is indicated by absence of clinically significant psychosocial distress or social isolation.           Core Components/Risk Factors/Patient Goals Review:  Goals and Risk Factor Review    Core Components/Risk Factors/Patient Goals Review    Row Name 04/14/18 1529   Personal Goals Review  Weight Management/Obesity;Hypertension;Lipids;Stress   Review  pt with mulltiple CAD RF demonstrates eagerness to participate in CR activities. pt personal goals are to resume previous weight lifting routine dead weights and powerlifts pt desires to delay aging effects on muscles, joints and mental ability   Expected Outcomes  pt will participate in CR exercise, nutrition and lifestyle modification opportunities.           Core Components/Risk Factors/Patient Goals at  Discharge (Final Review):  Goals and Risk Factor Review - 04/14/18 1529    Core Components/Risk Factors/Patient Goals Review          Personal Goals Review  Weight Management/Obesity;Hypertension;Lipids;Stress    Review  pt with mulltiple CAD RF demonstrates eagerness to participate in CR activities. pt personal goals are to resume previous weight lifting routine dead weights and powerlifts pt desires to delay aging effects on muscles, joints and mental ability    Expected Outcomes  pt will participate in CR  exercise, nutrition and lifestyle modification opportunities.            ITP Comments: ITP Comments    Row Name 04/08/18 1333 04/14/18 1526 04/24/18 1005   ITP Comments  Medical Director- Dr. Fransico Him, MD  pt started group exercise program. pt tolerated light activity without difficulty. pt oriented to exercise eqiupment and safety routine. understanding verbalized.   30 day ITP review.  pt currently on medical hold for cellulitis being treated by PCP, Dr. [name].       Comments:

## 2018-04-25 ENCOUNTER — Encounter (HOSPITAL_COMMUNITY): Payer: Medicare Other

## 2018-04-25 ENCOUNTER — Ambulatory Visit (HOSPITAL_COMMUNITY): Payer: Medicare Other

## 2018-04-28 ENCOUNTER — Ambulatory Visit (HOSPITAL_COMMUNITY): Payer: Medicare Other

## 2018-04-28 ENCOUNTER — Encounter (HOSPITAL_COMMUNITY)
Admission: RE | Admit: 2018-04-28 | Discharge: 2018-04-28 | Disposition: A | Discharge status: Home / Self Care | Payer: Medicare Other | Source: Ambulatory Visit | Attending: Cardiovascular Disease | Admitting: Cardiovascular Disease

## 2018-04-28 ENCOUNTER — Other Ambulatory Visit: Payer: Self-pay | Admitting: Cardiovascular Disease

## 2018-04-28 DIAGNOSIS — Z951 Presence of aortocoronary bypass graft: Secondary | ICD-10-CM | POA: Insufficient documentation

## 2018-04-28 DIAGNOSIS — I214 Non-ST elevation (NSTEMI) myocardial infarction: Secondary | ICD-10-CM | POA: Insufficient documentation

## 2018-04-30 ENCOUNTER — Encounter (HOSPITAL_COMMUNITY): Payer: Medicare Other

## 2018-04-30 ENCOUNTER — Ambulatory Visit (HOSPITAL_COMMUNITY): Payer: Medicare Other

## 2018-05-02 ENCOUNTER — Encounter (HOSPITAL_COMMUNITY): Payer: Medicare Other

## 2018-05-02 ENCOUNTER — Ambulatory Visit (HOSPITAL_COMMUNITY): Payer: Medicare Other

## 2018-05-05 ENCOUNTER — Ambulatory Visit (HOSPITAL_COMMUNITY): Payer: Medicare Other

## 2018-05-05 ENCOUNTER — Encounter (HOSPITAL_COMMUNITY): Payer: Medicare Other

## 2018-05-07 ENCOUNTER — Other Ambulatory Visit: Payer: Self-pay | Admitting: Cardiovascular Disease

## 2018-05-07 ENCOUNTER — Ambulatory Visit (HOSPITAL_COMMUNITY): Payer: Medicare Other

## 2018-05-07 ENCOUNTER — Encounter (HOSPITAL_COMMUNITY): Admission: RE | Admit: 2018-05-07 | Payer: Medicare Other | Source: Ambulatory Visit

## 2018-05-07 DIAGNOSIS — I4821 Permanent atrial fibrillation: Secondary | ICD-10-CM

## 2018-05-09 ENCOUNTER — Encounter (HOSPITAL_COMMUNITY): Payer: Medicare Other

## 2018-05-09 ENCOUNTER — Ambulatory Visit (HOSPITAL_COMMUNITY): Payer: Medicare Other

## 2018-05-12 ENCOUNTER — Ambulatory Visit (HOSPITAL_COMMUNITY): Payer: Medicare Other

## 2018-05-12 ENCOUNTER — Encounter (HOSPITAL_COMMUNITY)
Admission: RE | Admit: 2018-05-12 | Discharge: 2018-05-12 | Disposition: A | Discharge status: Home / Self Care | Payer: Medicare Other | Source: Ambulatory Visit | Attending: Cardiovascular Disease | Admitting: Cardiovascular Disease

## 2018-05-12 DIAGNOSIS — Z951 Presence of aortocoronary bypass graft: Secondary | ICD-10-CM | POA: Diagnosis present

## 2018-05-12 DIAGNOSIS — I214 Non-ST elevation (NSTEMI) myocardial infarction: Secondary | ICD-10-CM | POA: Diagnosis present

## 2018-05-12 NOTE — Progress Notes (Signed)
[name] 78 y.o. male Nutrition Note Spoke with pt. Nutrition plan and goals reviewed with pt. Pt is following heart healthy diet, eating in a mediterranean style. Pt wants to focus on using nutrition to fuel his workouts and maintain his functional strength. Heart healthy mediterranean style eating tips reviewed (label reading, how to build a healthy plate, portion sizes, eating frequently across the day). Discussed reducing sodium, since some mediterranean style foods can be heavy in brines and salt. Pt shared he reads labels and is working to reduce sodium since our discussion at orientation. Pt expressed understanding of the information reviewed. Pt aware of nutrition education classes offered and has attended heart healthy eating and style skill nutrition classes.  Lab Results  Component Value Date   HGBA1C 5.3 01/22/2018    Wt Readings from Last 3 Encounters:  04/08/18 230 lb 6.1 oz (104.5 kg)  03/25/18 229 lb (103.9 kg)  03/18/18 235 lb 9.6 oz (106.9 kg)    Nutrition Diagnosis ? Food-and nutrition-related knowledge deficit related to lack of exposure to information as related to diagnosis of: ? CVD   Nutrition Intervention ? Pt's individual nutrition plan reviewed with pt.  Goal(s) ? Pt to identify and limit food sources of saturated fat, trans fat, refined carbohydrates and sodium  Plan:   Pt to attend nutrition classes ? Nutrition I ? Nutrition II ? Portion Distortion   Will provide client-centered nutrition education as part of interdisciplinary care  Monitor and evaluate progress toward nutrition goal with team.    Laurina Bustle, MS, RD, LDN 05/12/2018 2:20 PM

## 2018-05-13 NOTE — Progress Notes (Signed)
Daily Session Note  Patient Details  Name: [name] MRN: [id number] Date of Birth: June 28, 1940 Referring Provider:   Flowsheet Row CARDIAC REHAB PHASE II ORIENTATION from 04/08/2018 in [place]  Referring Provider  [name], MD      Encounter Date: 05/12/2018  Check In: Session Check In - 05/12/18 1403    Check-In          Supervising physician immediately available to respond to emergencies  [facility] immediately available    Physician(s)  Dr Lonny Prude    Location  [facility]    Staff Present  Andi Hence, RN, Marga Melnick, RN, BSN;Brittany Vance, BS, ACSM CEP, Exercise Physiologist    Medication changes reported      No    Fall or balance concerns reported     No    Tobacco Cessation  No Change    Warm-up and Cool-down  Performed as group-led instruction    Resistance Training Performed  Yes    VAD Patient?  No    PAD/SET Patient?  No        Pain Assessment          Currently in Pain?  No/denies           Capillary Blood Glucose: No results found for this or any previous visit (from the past 24 hour(s)).  Exercise Prescription Changes - 05/12/18 1400    Response to Exercise          Blood Pressure (Admit)  122/70    Blood Pressure (Exercise)  186/80    Blood Pressure (Exit)  140/72    Heart Rate (Admit)  90 bpm    Heart Rate (Exercise)  125 bpm    Heart Rate (Exit)  89 bpm    Rating of Perceived Exertion (Exercise)  11    Duration  Progress to 30 minutes of  aerobic without signs/symptoms of physical distress        Progression          Progression  Continue to progress workloads to maintain intensity without signs/symptoms of physical distress.    Average METs  2.9        Resistance Training          Training Prescription  Yes    Weight  8 lbs.    Reps  10-15    Time  10 Minutes        Treadmill          MPH  2.5    Grade  0    Minutes  10    METs  2.91        Bike           Level  1.2    Minutes  10    METs  3.14        NuStep          Level  3    SPM  85    Minutes  10    METs  2.8           Social History   Tobacco Use  Smoking Status Former Smoker  . Years: 30.00  . Last attempt to quit: 11/25/1993  . Years since quitting: 24.4  Smokeless Tobacco Never Used    Goals Met:  Independence with exercise equipment  Goals Unmet HR  Comments: pt returned to cardiac rehab today s/p cellulitis treatment.  Pt cellulitis is resolving.  Pt has  completed antibiotic therapy. Pt HR-145bpm  with cool down weights. Pt using 8lb weights.  Pt weight prescription is 5lb.  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  He feels belittled with having to use light weights.  Cardiac recovery and THR guidelines reviewed.  Understanding verbalized.  Andi Hence, RN, BSN Cardiac Pulmonary Rehab    Dr. Fransico Him is Medical Director for Cardiac Rehab at [facility].

## 2018-05-14 ENCOUNTER — Ambulatory Visit (HOSPITAL_COMMUNITY): Payer: Medicare Other

## 2018-05-14 ENCOUNTER — Encounter (HOSPITAL_COMMUNITY)
Admission: RE | Admit: 2018-05-14 | Discharge: 2018-05-14 | Disposition: A | Discharge status: Home / Self Care | Payer: Medicare Other | Source: Ambulatory Visit | Attending: Cardiovascular Disease | Admitting: Cardiovascular Disease

## 2018-05-14 DIAGNOSIS — I214 Non-ST elevation (NSTEMI) myocardial infarction: Secondary | ICD-10-CM

## 2018-05-14 DIAGNOSIS — Z951 Presence of aortocoronary bypass graft: Secondary | ICD-10-CM | POA: Diagnosis not present

## 2018-05-14 NOTE — Progress Notes (Signed)
I have reviewed a Home Exercise Prescription with [name] . [name] is currently exercising at home.  The patient was advised to walk 5-7 days a week for 30-45 minutes.  [name] and I discussed how to progress their exercise prescription.  The patient stated that their goals were to continue walking at home and get back to exercising at the gym. Pt wants to start back weight lifting. Pt was advised to consult doctor in regards to this and a release could be sent from our program at a later time from Cardiac Rehab.  The patient stated that they understand the exercise prescription.  We reviewed exercise guidelines, target heart rate during exercise, RPE Scale, weather conditions, NTG use, endpoints for exercise, warmup and cool down.  Patient is encouraged to come to me with any questions. I will continue to follow up with the patient to assist them with progression and safety.    05/14/2018 3:17 PM  Deitra Mayo BS, ACSM CEP

## 2018-05-16 ENCOUNTER — Ambulatory Visit (HOSPITAL_COMMUNITY): Payer: Medicare Other

## 2018-05-16 ENCOUNTER — Encounter (HOSPITAL_COMMUNITY): Payer: Medicare Other

## 2018-05-19 ENCOUNTER — Encounter (HOSPITAL_COMMUNITY)
Admission: RE | Admit: 2018-05-19 | Discharge: 2018-05-19 | Disposition: A | Discharge status: Home / Self Care | Payer: Medicare Other | Source: Ambulatory Visit | Attending: Cardiovascular Disease | Admitting: Cardiovascular Disease

## 2018-05-19 ENCOUNTER — Ambulatory Visit (HOSPITAL_COMMUNITY): Payer: Medicare Other

## 2018-05-19 DIAGNOSIS — Z951 Presence of aortocoronary bypass graft: Secondary | ICD-10-CM | POA: Diagnosis not present

## 2018-05-19 DIAGNOSIS — I214 Non-ST elevation (NSTEMI) myocardial infarction: Secondary | ICD-10-CM | POA: Diagnosis present

## 2018-05-21 ENCOUNTER — Encounter (HOSPITAL_COMMUNITY)
Admission: RE | Admit: 2018-05-21 | Discharge: 2018-05-21 | Disposition: A | Discharge status: Home / Self Care | Payer: Medicare Other | Source: Ambulatory Visit | Attending: Cardiovascular Disease | Admitting: Cardiovascular Disease

## 2018-05-21 ENCOUNTER — Ambulatory Visit (HOSPITAL_COMMUNITY): Payer: Medicare Other

## 2018-05-21 DIAGNOSIS — Z951 Presence of aortocoronary bypass graft: Secondary | ICD-10-CM

## 2018-05-21 DIAGNOSIS — I214 Non-ST elevation (NSTEMI) myocardial infarction: Secondary | ICD-10-CM

## 2018-05-21 NOTE — Progress Notes (Signed)
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  Alpha Lipoic Acid 200 MG CAPS, Take 600 mg by mouth 2 (two) times daily. , Disp: , Rfl:  .  aspirin 81 MG tablet, Take 81 mg by mouth daily., Disp: , Rfl:  .  atorvastatin (LIPITOR) 80 MG tablet, TAKE 1 TABLET AT 6PM., Disp: 30 tablet, Rfl: 10 .  cholecalciferol (VITAMIN D) 1000 UNITS tablet, Take 1,000 Units by mouth daily., Disp: , Rfl:  .  Coenzyme Q10 200 MG capsule, Take 100 mg by mouth 2 (two) times daily. , Disp: , Rfl:  .  Cyanocobalamin (VITAMIN B 12 PO), Take 100 mg by mouth daily. , Disp: , Rfl:  .  ELIQUIS 5 MG TABS tablet, TAKE 1 TABLET BY MOUTH TWICE DAILY., Disp: 180 tablet, Rfl: 3 .  Flaxseed, Linseed, (FLAX SEED OIL PO), Take 1,400 mg by mouth daily., Disp: , Rfl:  .  furosemide (LASIX) 40 MG tablet, Take 1 tablet (40 mg total) by mouth daily., Disp: 90 tablet, Rfl: 3 .  lisinopril (PRINIVIL,ZESTRIL) 20 MG tablet, Take 1 tablet (20 mg total) by mouth daily. HOLD UNTIL YOUR APPT WITH DR Glynda Jaeger, Disp: 30 tablet, Rfl: 1 .  magnesium oxide (MAG-OX) 400 MG tablet, Take 400 mg by mouth daily., Disp: , Rfl:  .  Omega-3 Fatty Acids (FISH OIL) 1200 MG CAPS, Take 1,000 mg by mouth daily. , Disp: , Rfl:  .  potassium chloride 20 MEQ TBCR, Take 20 mEq by mouth daily., Disp: 90 tablet, Rfl: 3 .  pyridoxine (B-6) 200 MG tablet, Take 200 mg by mouth daily., Disp: , Rfl:  .  vitamin C (ASCORBIC ACID) 500 MG  tablet, Take 1,000 mg by mouth 2 (two) times daily., Disp: , Rfl:   Past Medical History: Past Medical History:  Diagnosis Date  . A-fib ([place])   . Allergy    Seasonal--pollen  . Anticoagulation goal of INR 2 to 3    on coumadin  . Arrhythmia   . CAD (coronary artery disease) 1999   RCA stent  . Carotid stenosis, asymptomatic    moderate RT and mild LT with carotid dopplers 10/26/11  . Colon cancer ([place]) 04/2006   Stage III--T3 N1  . COPD (chronic obstructive pulmonary disease) ([place])   . H/O cardiovascular stress test 01/13/2010   low risk scan, similar to 2008 study  . H/O echocardiogram 05/23/2009   EF >55%, mild MR, Mild TR,   . Hand foot syndrome   . Hemorrhoids   . Hyperlipidemia   . Hypertension   . Neuropathy   . Permanent atrial fibrillation   . Psoriasis   . Tubular adenoma of colon     Tobacco Use: Social History   Tobacco Use  Smoking Status Former Smoker  . Years: 30.00  . Last attempt to quit: 11/25/1993  . Years since quitting: 24.5  Smokeless Tobacco Never Used    Labs: Recent Review [employer]  for [facility] Cardiac and Pulmonary Rehab Latest Ref Rng & Units 01/28/2018 01/28/2018 01/28/2018 01/28/2018 01/29/2018   Cholestrol 0 - 200 mg/dL - - - - -   LDLCALC 0 - 99 mg/dL - - - - -   HDL >40 mg/dL - - - - -   Trlycerides <150 mg/dL - - - - -   Hemoglobin A1c 4.8 - 5.6 % - - - - -   PHART 7.350 - 7.450 7.388 7.345(L) 7.347(L) - -   PCO2ART 32.0 - 48.0 mmHg 37.1 44.2 44.1 - -   HCO3 20.0 - 28.0 mmol/L 22.7 24.1 24.3 - -   TCO2 22 - 32 mmol/L _0 ACIDBASEDEF 0.0 - 2.0 mmol/L 2.0 2.0 2.0 - -   O2SAT % 98.0 98.0 99.0 - -      Capillary Blood Glucose: Lab Results  Component Value Date   GLUCAP 97 01/30/2018   GLUCAP 138 (H) 01/29/2018   GLUCAP 127 (H) 01/29/2018   GLUCAP 122 (H) 01/29/2018   GLUCAP 112 (H) 01/29/2018     Exercise Target Goals: Exercise Program Goal: Individual exercise prescription set using results from  initial 6 min walk test and THRR while considering  patient's activity barriers and safety.   Exercise Prescription Goal: Initial exercise prescription builds to 30-45 minutes a day of aerobic activity, 2-3 days per week.  Home exercise guidelines will be given to patient during program as part of exercise prescription that the participant will acknowledge.  Activity Barriers & Risk Stratification: Activity Barriers & Cardiac Risk Stratification - 04/08/18 1359    Activity Barriers & Cardiac Risk Stratification          Activity Barriers  None    Cardiac Risk Stratification  High           6 Minute Walk: 6 Minute Walk    6 Minute Walk    Row Name 04/08/18 1456   Phase  Initial   Distance  1528 feet   Walk Time  6 minutes   # of Rest Breaks  0   MPH  2.89   METS  3.22   RPE  11   VO2 Peak  11.26   Symptoms  Yes (comment)   Comments  left ankle pain   Resting HR  71 bpm   Resting BP  136/83   Resting Oxygen Saturation   97 %   Exercise Oxygen Saturation  during 6 min walk  96 %   Max Ex. HR  108 bpm   Max Ex. BP  146/82   2 Minute Post BP  140/83          Oxygen Initial Assessment:   Oxygen Re-Evaluation:   Oxygen Discharge (Final Oxygen Re-Evaluation):   Initial Exercise Prescription: Initial Exercise Prescription - 04/08/18 1400    Date of Initial Exercise RX and Referring Provider          Date  04/08/18    Referring Provider  [name], MD    Expected Discharge Date  07/14/18        Treadmill          MPH  2.5    Grade  0    Minutes  10    METs  2.91        Bike          Level  1.2    Minutes  10    METs  3.16  NuStep          Level  3    SPM  85    Minutes  10    METs  2.8        Prescription Details          Frequency (times per week)  3    Duration  Progress to 30 minutes of continuous aerobic without signs/symptoms of physical distress        Intensity          THRR 40-80% of Max Heartrate  58-116    Ratings  of Perceived Exertion  11-13    Perceived Dyspnea  0-4        Progression          Progression  Continue to progress workloads to maintain intensity without signs/symptoms of physical distress.        Resistance Training          Training Prescription  Yes    Weight  4lbs    Reps  10-15           Perform Capillary Blood Glucose checks as needed.  Exercise Prescription Changes: Exercise Prescription Changes    Response to Exercise    Row Name 04/21/18 1500 05/12/18 1400 05/14/18 1500   Blood Pressure (Admit)  120/64  122/70  no documentation   Blood Pressure (Exercise)  132/80  186/80  no documentation   Blood Pressure (Exit)  120/68  140/72  no documentation   Heart Rate (Admit)  91 bpm  90 bpm  no documentation   Heart Rate (Exercise)  142 bpm  125 bpm  no documentation   Heart Rate (Exit)  82 bpm  89 bpm  no documentation   Rating of Perceived Exertion (Exercise)  11  11  no documentation   Symptoms  Left leg pain.   no documentation  no documentation   Duration  Progress to 30 minutes of  aerobic without signs/symptoms of physical distress  Progress to 30 minutes of  aerobic without signs/symptoms of physical distress  no documentation       Progression    Row Name 04/21/18 1500 05/12/18 1400 05/14/18 1500   Progression  Continue to progress workloads to maintain intensity without signs/symptoms of physical distress.  Continue to progress workloads to maintain intensity without signs/symptoms of physical distress.  no documentation   Average METs  2.9  2.9  no documentation       Resistance Training    Row Name 04/21/18 1500 05/12/18 1400 05/14/18 1500   Training Prescription  No  Yes  no documentation   Weight  no documentation  8 lbs.  no documentation   Reps  no documentation  10-15  no documentation   Time  no documentation  10 Minutes  no documentation       Treadmill    Row Name 04/21/18 1500 05/12/18 1400 05/14/18 1500   MPH  2.5  2.5  no documentation    Grade  0  0  no documentation   Minutes  10  10  no documentation   METs  2.91  2.91  no documentation       [place] Name 04/21/18 1500 05/12/18 1400 05/14/18 1500   Level  1.2  1.2  no documentation   Minutes  10  10  no documentation   METs  3.16  3.14  no documentation       NuStep  [place] Name 04/21/18 1500 05/12/18 1400 05/14/18 1500   Level  3  3  no documentation   SPM  85  85  no documentation   Minutes  10  10  no documentation   METs  2.7  2.8  no documentation       [place] Name 04/21/18 1500 05/12/18 1400 05/14/18 1500   Plans to continue exercise at  no documentation  no documentation  Home (comment)   Frequency  no documentation  no documentation  Add 3 additional days to program exercise sessions.   Initial Home Exercises Provided  no documentation  no documentation  05/14/18          Exercise Comments: Exercise Comments    Row Name 04/24/18 1101 05/12/18 1500 05/14/18 1519 05/20/18 1115   Exercise Comments  Pt has been absent form the program. Will follow up with Pt.   Pt has returned after absence. Pt tolerated exercise well.  Reviewed HEP with Pt. Pt was responsive and understands goals.   Reviewed METs and goals with Pt. Pt is tolerating exercise well and is exercising at home in addition to the program.       Exercise Goals and Review: Exercise Goals    Exercise Goals    Row Name 04/08/18 1337   Increase Physical Activity  Yes   Intervention  Provide advice, education, support and counseling about physical activity/exercise needs.;Develop an individualized exercise prescription for aerobic and resistive training based on initial evaluation findings, risk stratification, comorbidities and participant's personal goals.   Expected Outcomes  Short Term: Attend rehab on a regular basis to increase amount of physical activity.   Increase Strength and Stamina  Yes   Intervention  Provide advice, education, support and counseling about  physical activity/exercise needs.;Develop an individualized exercise prescription for aerobic and resistive training based on initial evaluation findings, risk stratification, comorbidities and participant's personal goals.   Expected Outcomes  Short Term: Increase workloads from initial exercise prescription for resistance, speed, and METs.   Able to understand and use rate of perceived exertion (RPE) scale  Yes   Intervention  Provide education and explanation on how to use RPE scale   Expected Outcomes  Short Term: Able to use RPE daily in rehab to express subjective intensity level;Long Term:  Able to use RPE to guide intensity level when exercising independently   Knowledge and understanding of Target Heart Rate Range (THRR)  Yes   Intervention  Provide education and explanation of THRR including how the numbers were predicted and where they are located for reference   Expected Outcomes  Short Term: Able to state/look up THRR;Long Term: Able to use THRR to govern intensity when exercising independently;Short Term: Able to use daily as guideline for intensity in rehab   Able to check pulse independently  Yes   Intervention  Provide education and demonstration on how to check pulse in carotid and radial arteries.;Review the importance of being able to check your own pulse for safety during independent exercise   Expected Outcomes  Short Term: Able to explain why pulse checking is important during independent exercise;Long Term: Able to check pulse independently and accurately   Understanding of Exercise Prescription  Yes   Intervention  Provide education, explanation, and written materials on patient's individual exercise prescription   Expected Outcomes  Short Term: Able to explain program exercise prescription;Long Term: Able to explain home exercise prescription to exercise independently  Exercise Goals Re-Evaluation : Exercise Goals Re-Evaluation    Exercise Goal Re-Evaluation     Row Name 04/24/18 1058 05/12/18 1459 05/14/18 1518 05/20/18 1111   Exercise Goals Review  Increase Physical Activity;Increase Strength and Stamina;Able to understand and use rate of perceived exertion (RPE) scale;Knowledge and understanding of Target Heart Rate Range (THRR);Understanding of Exercise Prescription  Increase Physical Activity;Increase Strength and Stamina;Able to understand and use rate of perceived exertion (RPE) scale;Knowledge and understanding of Target Heart Rate Range (THRR);Understanding of Exercise Prescription  Increase Physical Activity;Increase Strength and Stamina;Able to understand and use rate of perceived exertion (RPE) scale;Knowledge and understanding of Target Heart Rate Range (THRR);Understanding of Exercise Prescription  Increase Physical Activity;Increase Strength and Stamina;Able to understand and use rate of perceived exertion (RPE) scale;Knowledge and understanding of Target Heart Rate Range (THRR);Understanding of Exercise Prescription   Comments  Pt has been absent form the program. Pt has completed two sessions and has a MET level of 2.9. Will follow up with Pt.   Pt has returned to cardiac Rehab after being absent for a period of time. Pt tolerated exercise well. Will increase workloads as tolerated.  Reviewed HEP with Pt. Pt understands RPE scale, weather precautions, THRR, warm up and cool down exercises, and end points ofd exericse. Pt is walking at home for 30-45 minutes 5-7 days per week.   Reviewed METs and goals with Pt. Pt MET level is 2.9. Pt is tolerating eercise well and is increasing workloads. Pt attendance has increased. Pt is exercising at home 2-4 days per week in addition to Cardiac Rehab.    Expected Outcomes  Will continue to monitor and progress Pt as tolerated.   Will continue to monitor and progress Pt as tolerated.   Will continue to monitor and progress Pt as tolerated.   Will continue to monitor and progress Pt as tolerated.            Discharge Exercise Prescription (Final Exercise Prescription Changes): Exercise Prescription Changes - 05/14/18 1500    Home Exercise Plan          Plans to continue exercise at  Home (comment)    Frequency  Add 3 additional days to program exercise sessions.    Initial Home Exercises Provided  05/14/18           Nutrition:  Target Goals: Understanding of nutrition guidelines, daily intake of sodium <153m, cholesterol <2078m calories 30% from fat and 7% or less from saturated fats, daily to have 5 or more servings of fruits and vegetables.  Biometrics: Pre Biometrics - 04/08/18 1441    Pre Biometrics          Height  6' 2.5" (1.892 m)    Weight  104.5 kg    Waist Circumference  42.75 inches    Hip Circumference  42 inches    Waist to Hip Ratio  1.02 %    BMI (Calculated)  29.19    Triceps Skinfold  9 mm    % Body Fat  26.9 %    Grip Strength  44 kg    Flexibility  7 in    Single Leg Stand  1.88 seconds            Nutrition Therapy Plan and Nutrition Goals: Nutrition Therapy & Goals - 04/08/18 1608    Nutrition Therapy          Diet  heart healthy        Personal Nutrition Goals  Nutrition Goal  Pt to identify and limit food sources of saturated fat, trans fat, refined carbohydrates and sodium        Intervention Plan          Intervention  Prescribe, educate and counsel regarding individualized specific dietary modifications aiming towards targeted core components such as weight, hypertension, lipid management, diabetes, heart failure and other comorbidities.    Expected Outcomes  Short Term Goal: Understand basic principles of dietary content, such as calories, fat, sodium, cholesterol and nutrients.;Long Term Goal: Adherence to prescribed nutrition plan.           Nutrition Assessments: Nutrition Assessments - 04/08/18 1612    MEDFICTS Scores          Pre Score  15           Nutrition Goals Re-Evaluation: Nutrition Goals  Re-Evaluation    Goals    Row Name 04/08/18 1608   Current Weight  230 lb 6.1 oz (104.5 kg)          Nutrition Goals Re-Evaluation: Nutrition Goals Re-Evaluation    Goals    Row Name 04/08/18 1608   Current Weight  230 lb 6.1 oz (104.5 kg)          Nutrition Goals Discharge (Final Nutrition Goals Re-Evaluation): Nutrition Goals Re-Evaluation - 04/08/18 1608    Goals          Current Weight  230 lb 6.1 oz (104.5 kg)           Psychosocial: Target Goals: Acknowledge presence or absence of significant depression and/or stress, maximize coping skills, provide positive support system. Participant is able to verbalize types and ability to use techniques and skills needed for reducing stress and depression.  Initial Review & Psychosocial Screening: Initial Psych Review & Screening - 04/08/18 1532    Initial Review          Current issues with  None Identified        Family Dynamics          Good Support System?  Yes   Fraser Din has his wife for support       Barriers          Psychosocial barriers to participate in program  There are no identifiable barriers or psychosocial needs.        Screening Interventions          Interventions  Encouraged to exercise           Quality of Life Scores: Quality of Life - 04/08/18 1337    Quality of Life          Select  Quality of Life        Quality of Life Scores          Health/Function Pre  26.13 %    Socioeconomic Pre  27.36 %    Psych/Spiritual Pre  24.64 %    Family Pre  30 %    GLOBAL Pre  26.65 %          Scores of 19 and below usually indicate a poorer quality of life in these areas.  A difference of  2-3 points is a clinically meaningful difference.  A difference of 2-3 points in the total score of the Quality of Life Index has been associated with significant improvement in overall quality of life, self-image, physical symptoms, and general health in studies assessing change in quality of  life.  PHQ-9: Recent Review Flowsheet Data  Depression screen [facility] 2/9 04/14/2018   Decreased Interest 0   Down, Depressed, Hopeless 0   PHQ - 2 Score 0     Interpretation of Total Score  Total Score Depression Severity:  1-4 = Minimal depression, 5-9 = Mild depression, 10-14 = Moderate depression, 15-19 = Moderately severe depression, 20-27 = Severe depression   Psychosocial Evaluation and Intervention: Psychosocial Evaluation - 04/14/18 1528    Psychosocial Evaluation & Interventions          Interventions  Encouraged to exercise with the program and follow exercise prescription    Comments  no psychosocial needs identified, no interventions necessary.  pt enjoys woodwokring, fishing, riding trail bikes, reading and cooking.      Expected Outcomes  pt will exhibit positive outlook with good coping skills.     Continue Psychosocial Services   No Follow up required           Psychosocial Re-Evaluation: Psychosocial Re-Evaluation    Psychosocial Re-Evaluation    Row Name 04/24/18 1006 05/20/18 1351   Current issues with  None Identified  None Identified   Comments  no psychosocial needs identified, no interventions necessary.   no psychosocial needs identified, no interventions necessary.    Expected Outcomes  pt will exhibit positive outlook with good coping skills.   pt will exhibit positive outlook with good coping skills.    Interventions  Encouraged to attend Cardiac Rehabilitation for the exercise  Encouraged to attend Cardiac Rehabilitation for the exercise   Continue Psychosocial Services   No Follow up required  No Follow up required          Psychosocial Discharge (Final Psychosocial Re-Evaluation): Psychosocial Re-Evaluation - 05/20/18 1351    Psychosocial Re-Evaluation          Current issues with  None Identified    Comments  no psychosocial needs identified, no interventions necessary.     Expected Outcomes  pt will exhibit positive outlook with good coping  skills.     Interventions  Encouraged to attend Cardiac Rehabilitation for the exercise    Continue Psychosocial Services   No Follow up required           Vocational Rehabilitation: Provide vocational rehab assistance to qualifying candidates.   Vocational Rehab Evaluation & Intervention: Vocational Rehab - 04/08/18 1536    Initial Vocational Rehab Evaluation & Intervention          Assessment shows need for Vocational Rehabilitation  No           Education: Education Goals: Education classes will be provided on a weekly basis, covering required topics. Participant will state understanding/return demonstration of topics presented.  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  Instructors address importance of being able to find your pulse and how to count your pulse when at home without a heart monitor.  Patients get hands on experience counting their pulse with staff help and individually.   Heart Attack, Angina, and Risk Factor Modification:  -Group instruction provided by verbal instruction, video, and written materials to support subject.  Instructors address signs and symptoms of angina and  heart attacks.    Also discuss risk factors for heart disease and how to make changes to improve heart health risk factors.   Functional Fitness:  -Group instruction provided by verbal instruction, demonstration, patient participation, and written materials to support subject.  Instructors address safety measures for doing things around the house.  Discuss how to get up and down off the floor, how to  pick things up properly, how to safely get out of a chair without assistance, and balance training.   Meditation and Mindfulness:  -Group instruction provided by verbal instruction, patient participation, and written materials to support subject.  Instructor addresses importance of mindfulness and meditation practice to help reduce stress and improve awareness.  Instructor also leads participants through a meditation exercise.    Stretching for Flexibility and Mobility:  -Group instruction provided by verbal instruction, patient participation, and written materials to support subject.  Instructors lead participants through series of stretches that are designed to increase flexibility thus improving mobility.  These stretches are additional exercise for major muscle groups that are typically performed during regular warm up and cool down.   Hands Only CPR:  -Group verbal, video, and participation provides a basic overview of AHA guidelines for community CPR. Role-play of emergencies allow participants the opportunity to practice calling for help and chest compression technique with discussion of AED use.   Hypertension: -Group verbal and written instruction that provides a basic overview of hypertension including the most recent diagnostic guidelines, risk factor reduction with self-care instructions and medication management.    Nutrition I class: Heart Healthy Eating:  -Group instruction provided by PowerPoint slides, verbal discussion, and written materials to support subject matter. The instructor gives an explanation and review of the Therapeutic Lifestyle Changes diet recommendations, which includes a discussion on lipid goals, dietary fat, sodium, fiber, plant stanol/sterol esters, sugar, and the components of a well-balanced, healthy diet.   Nutrition II class: Lifestyle Skills:  -Group instruction provided by PowerPoint slides, verbal discussion, and written materials to support  subject matter. The instructor gives an explanation and review of label reading, grocery shopping for heart health, heart healthy recipe modifications, and ways to make healthier choices when eating out.   Diabetes Question & Answer:  -Group instruction provided by PowerPoint slides, verbal discussion, and written materials to support subject matter. The instructor gives an explanation and review of diabetes co-morbidities, pre- and post-prandial blood glucose goals, pre-exercise blood glucose goals, signs, symptoms, and treatment of hypoglycemia and hyperglycemia, and foot care basics.   Diabetes Blitz:  -Group instruction provided by PowerPoint slides, verbal discussion, and written materials to support subject matter. The instructor gives an explanation and review of the physiology behind type 1 and type 2 diabetes, diabetes medications and rational behind using different medications, pre- and post-prandial blood glucose recommendations and Hemoglobin A1c goals, diabetes diet, and exercise including blood glucose guidelines for exercising safely.    Portion Distortion:  -Group instruction provided by PowerPoint slides, verbal discussion, written materials, and food models to support subject matter. The instructor gives an explanation of serving size versus portion size, changes in portions sizes over the last 20 years, and what consists of a serving from each food group.   Stress Management:  -Group instruction provided by verbal instruction, video, and written materials to support subject matter.  Instructors review role of stress in heart disease and how to cope with stress positively.     Exercising on Your Own:  -Group instruction provided by verbal instruction,  power point, and written materials to support subject.  Instructors discuss benefits of exercise, components of exercise, frequency and intensity of exercise, and end points for exercise.  Also discuss use of nitroglycerin and  activating EMS.  Review options of places to exercise outside of rehab.  Review guidelines for sex with heart disease.   Cardiac Drugs I:  -Group instruction provided by verbal instruction and written materials to support subject.  Instructor reviews cardiac drug classes: antiplatelets, anticoagulants, beta blockers, and statins.  Instructor discusses reasons, side effects, and lifestyle considerations for each drug class.   Cardiac Drugs II:  -Group instruction provided by verbal instruction and written materials to support subject.  Instructor reviews cardiac drug classes: angiotensin converting enzyme inhibitors (ACE-I), angiotensin II receptor blockers (ARBs), nitrates, and calcium channel blockers.  Instructor discusses reasons, side effects, and lifestyle considerations for each drug class.   Anatomy and Physiology of the Circulatory System:  Group verbal and written instruction and models provide basic cardiac anatomy and physiology, with the coronary electrical and arterial systems. Review of: AMI, Angina, Valve disease, Heart Failure, Peripheral Artery Disease, Cardiac Arrhythmia, Pacemakers, and the ICD.   Other Education:  -Group or individual verbal, written, or video instructions that support the educational goals of the cardiac rehab program.   Holiday Eating Survival Tips:  -Group instruction provided by PowerPoint slides, verbal discussion, and written materials to support subject matter. The instructor gives patients tips, tricks, and techniques to help them not only survive but enjoy the holidays despite the onslaught of food that accompanies the holidays.   Knowledge Questionnaire Score: Knowledge Questionnaire Score - 04/08/18 1337    Knowledge Questionnaire Score          Pre Score  18/24           Core Components/Risk Factors/Patient Goals at Admission: Personal Goals and Risk Factors at Admission - 04/08/18 1449    Core Components/Risk Factors/Patient Goals  on Admission           Weight Management  Yes;Weight Maintenance    Intervention  Weight Management: Develop a combined nutrition and exercise program designed to reach desired caloric intake, while maintaining appropriate intake of nutrient and fiber, sodium and fats, and appropriate energy expenditure required for the weight goal.;Weight Management: Provide education and appropriate resources to help participant work on and attain dietary goals.;Weight Management/Obesity: Establish reasonable short term and long term weight goals.    Admit Weight  230 lb 6.1 oz (104.5 kg)    Expected Outcomes  Short Term: Continue to assess and modify interventions until short term weight is achieved;Long Term: Adherence to nutrition and physical activity/exercise program aimed toward attainment of established weight goal;Weight Maintenance: Understanding of the daily nutrition guidelines, which includes 25-35% calories from fat, 7% or less cal from saturated fats, less than 243m cholesterol, less than 1.5gm of sodium, & 5 or more servings of fruits and vegetables daily;Understanding recommendations for meals to include 15-35% energy as protein, 25-35% energy from fat, 35-60% energy from carbohydrates, less than 2011mof dietary cholesterol, 20-35 gm of total fiber daily;Understanding of distribution of calorie intake throughout the day with the consumption of 4-5 meals/snacks    Hypertension  Yes    Intervention  Provide education on lifestyle modifcations including regular physical activity/exercise, weight management, moderate sodium restriction and increased consumption of fresh fruit, vegetables, and low fat dairy, alcohol moderation, and smoking cessation.;Monitor prescription use compliance.    Expected Outcomes  Short Term: Continued assessment and intervention  until BP is < 140/29m HG in hypertensive participants. < 130/834mHG in hypertensive participants with diabetes, heart failure or chronic kidney  disease.;Long Term: Maintenance of blood pressure at goal levels.    Lipids  Yes    Intervention  Provide education and support for participant on nutrition & aerobic/resistive exercise along with prescribed medications to achieve LDL <7045mHDL >53m85m  Expected Outcomes  Short Term: Participant states understanding of desired cholesterol values and is compliant with medications prescribed. Participant is following exercise prescription and nutrition guidelines.;Long Term: Cholesterol controlled with medications as prescribed, with individualized exercise RX and with personalized nutrition plan. Value goals: LDL < 70mg72mL > 40 mg.    Stress  Yes    Intervention  Offer individual and/or small group education and counseling on adjustment to heart disease, stress management and health-related lifestyle change. Teach and support self-help strategies.;Refer participants experiencing significant psychosocial distress to appropriate mental health specialists for further evaluation and treatment. When possible, include family members and significant others in education/counseling sessions.    Expected Outcomes  Short Term: Participant demonstrates changes in health-related behavior, relaxation and other stress management skills, ability to obtain effective social support, and compliance with psychotropic medications if prescribed.;Long Term: Emotional wellbeing is indicated by absence of clinically significant psychosocial distress or social isolation.           Core Components/Risk Factors/Patient Goals Review:  Goals and Risk Factor Review    Core Components/Risk Factors/Patient Goals Review    Row Name 04/14/18 1529 05/20/18 1351   Personal Goals Review  Weight Management/Obesity;Hypertension;Lipids;Stress  Weight Management/Obesity;Hypertension;Lipids;Stress   Review  pt with mulltiple CAD RF demonstrates eagerness to participate in CR activities. pt personal goals are to resume previous weight  lifting routine dead weights and powerlifts pt desires to delay aging effects on muscles, joints and mental ability  pt with mulltiple CAD RF demonstrates eagerness to participate in CR activities. pt personal goals are to resume previous weight lifting routine dead weights and powerlifts pt desires to delay aging effects on muscles, joints and mental ability.  pt very eager to reume using weights safely.     Expected Outcomes  pt will participate in CR exercise, nutrition and lifestyle modification opportunities.   pt will participate in CR exercise, nutrition and lifestyle modification opportunities.           Core Components/Risk Factors/Patient Goals at Discharge (Final Review):  Goals and Risk Factor Review - 05/20/18 1351    Core Components/Risk Factors/Patient Goals Review          Personal Goals Review  Weight Management/Obesity;Hypertension;Lipids;Stress    Review  pt with mulltiple CAD RF demonstrates eagerness to participate in CR activities. pt personal goals are to resume previous weight lifting routine dead weights and powerlifts pt desires to delay aging effects on muscles, joints and mental ability.  pt very eager to reume using weights safely.      Expected Outcomes  pt will participate in CR exercise, nutrition and lifestyle modification opportunities.            ITP Comments: ITP Comments    Row Name 04/08/18 1333 04/14/18 1526 04/24/18 1005 05/20/18 1350   ITP Comments  Medical Director- Dr. TraciFransico Him pt started group exercise program. pt tolerated light activity without difficulty. pt oriented to exercise eqiupment and safety routine. understanding verbalized.   30 day ITP review.  pt currently on medical hold for cellulitis being treated by PCP, Dr. GriffLaurann [place]  30 day ITP review. pt has returned to cardiac rehab with completion of cellulitis treatment. pt demonstrates eagerness to participate in CR program.      Comments:

## 2018-05-23 ENCOUNTER — Ambulatory Visit (HOSPITAL_COMMUNITY): Payer: Medicare Other

## 2018-05-23 ENCOUNTER — Encounter (HOSPITAL_COMMUNITY)
Admission: RE | Admit: 2018-05-23 | Discharge: 2018-05-23 | Disposition: A | Discharge status: Home / Self Care | Payer: Medicare Other | Source: Ambulatory Visit | Attending: Cardiovascular Disease | Admitting: Cardiovascular Disease

## 2018-05-23 DIAGNOSIS — Z951 Presence of aortocoronary bypass graft: Secondary | ICD-10-CM

## 2018-05-23 DIAGNOSIS — I214 Non-ST elevation (NSTEMI) myocardial infarction: Secondary | ICD-10-CM

## 2018-05-26 ENCOUNTER — Encounter (HOSPITAL_COMMUNITY)
Admission: RE | Admit: 2018-05-26 | Discharge: 2018-05-26 | Disposition: A | Discharge status: Home / Self Care | Payer: Medicare Other | Source: Ambulatory Visit | Attending: Cardiovascular Disease | Admitting: Cardiovascular Disease

## 2018-05-26 ENCOUNTER — Ambulatory Visit (HOSPITAL_COMMUNITY): Payer: Medicare Other

## 2018-05-26 DIAGNOSIS — Z951 Presence of aortocoronary bypass graft: Secondary | ICD-10-CM

## 2018-05-26 DIAGNOSIS — I214 Non-ST elevation (NSTEMI) myocardial infarction: Secondary | ICD-10-CM

## 2018-05-28 ENCOUNTER — Ambulatory Visit (HOSPITAL_COMMUNITY): Payer: Medicare Other

## 2018-05-28 ENCOUNTER — Other Ambulatory Visit: Payer: Self-pay

## 2018-05-28 ENCOUNTER — Encounter (HOSPITAL_COMMUNITY)
Admission: RE | Admit: 2018-05-28 | Discharge: 2018-05-28 | Disposition: A | Discharge status: Home / Self Care | Payer: Medicare Other | Source: Ambulatory Visit | Attending: Cardiovascular Disease | Admitting: Cardiovascular Disease

## 2018-05-28 DIAGNOSIS — Z951 Presence of aortocoronary bypass graft: Secondary | ICD-10-CM | POA: Diagnosis not present

## 2018-05-28 DIAGNOSIS — I214 Non-ST elevation (NSTEMI) myocardial infarction: Secondary | ICD-10-CM

## 2018-05-30 ENCOUNTER — Other Ambulatory Visit: Payer: Self-pay

## 2018-05-30 ENCOUNTER — Ambulatory Visit (HOSPITAL_COMMUNITY): Payer: Medicare Other

## 2018-05-30 ENCOUNTER — Encounter (HOSPITAL_COMMUNITY)
Admission: RE | Admit: 2018-05-30 | Discharge: 2018-05-30 | Disposition: A | Discharge status: Home / Self Care | Payer: Medicare Other | Source: Ambulatory Visit | Attending: Cardiovascular Disease | Admitting: Cardiovascular Disease

## 2018-05-30 DIAGNOSIS — I214 Non-ST elevation (NSTEMI) myocardial infarction: Secondary | ICD-10-CM

## 2018-05-30 DIAGNOSIS — Z951 Presence of aortocoronary bypass graft: Secondary | ICD-10-CM | POA: Diagnosis not present

## 2018-06-02 ENCOUNTER — Telehealth (HOSPITAL_COMMUNITY): Payer: Self-pay | Admitting: Cardiac Rehabilitation

## 2018-06-02 ENCOUNTER — Ambulatory Visit (HOSPITAL_COMMUNITY): Payer: Medicare Other

## 2018-06-02 ENCOUNTER — Encounter (HOSPITAL_COMMUNITY): Payer: Medicare Other

## 2018-06-02 NOTE — Telephone Encounter (Signed)
Phone call to pt to notify of Cardiac Rehab Phase II departmental closing for 2 weeks.  LM on answering machine.  Andi Hence, RN, BSN Cardiac Pulmonary Rehab

## 2018-06-04 ENCOUNTER — Encounter (HOSPITAL_COMMUNITY): Payer: Medicare Other

## 2018-06-04 ENCOUNTER — Ambulatory Visit (HOSPITAL_COMMUNITY): Payer: Medicare Other

## 2018-06-06 ENCOUNTER — Encounter (HOSPITAL_COMMUNITY): Payer: Medicare Other

## 2018-06-06 ENCOUNTER — Ambulatory Visit (HOSPITAL_COMMUNITY): Payer: Medicare Other

## 2018-06-09 ENCOUNTER — Ambulatory Visit (HOSPITAL_COMMUNITY): Payer: Medicare Other

## 2018-06-09 ENCOUNTER — Encounter (HOSPITAL_COMMUNITY): Payer: Medicare Other

## 2018-06-09 ENCOUNTER — Telehealth (HOSPITAL_COMMUNITY): Payer: Self-pay | Admitting: Cardiac Rehabilitation

## 2018-06-09 NOTE — Telephone Encounter (Signed)
Phone call to pt to advise of Outpatient Cardiac Rehab departmental closing for an additional 4 weeks as part of Covid 19 precautions.  No answer, left message on answering machine.  Andi Hence, RN, BSN Cardiac Pulmonary Rehab

## 2018-06-10 ENCOUNTER — Encounter (HOSPITAL_COMMUNITY): Payer: Self-pay | Admitting: Cardiac Rehabilitation

## 2018-06-10 DIAGNOSIS — I214 Non-ST elevation (NSTEMI) myocardial infarction: Secondary | ICD-10-CM

## 2018-06-10 DIAGNOSIS — Z951 Presence of aortocoronary bypass graft: Secondary | ICD-10-CM

## 2018-06-10 NOTE — Progress Notes (Signed)
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  Alpha Lipoic Acid 200 MG CAPS, Take 600 mg by mouth 2 (two) times daily. , Disp: , Rfl:  .  aspirin 81 MG tablet, Take 81 mg by mouth daily., Disp: , Rfl:  .  atorvastatin (LIPITOR) 80 MG tablet, TAKE 1 TABLET AT 6PM., Disp: 30 tablet, Rfl: 10 .  cholecalciferol (VITAMIN D) 1000 UNITS tablet, Take 1,000 Units by mouth daily., Disp: , Rfl:  .  Coenzyme Q10 200 MG capsule, Take 100 mg by mouth 2 (two) times daily. , Disp: , Rfl:  .  Cyanocobalamin (VITAMIN B 12 PO), Take 100 mg by mouth daily. , Disp: , Rfl:  .  ELIQUIS 5 MG TABS tablet, TAKE 1 TABLET BY MOUTH TWICE DAILY., Disp: 180 tablet, Rfl: 3 .  Flaxseed, Linseed, (FLAX SEED OIL PO), Take 1,400 mg by mouth daily., Disp: , Rfl:  .  furosemide (LASIX) 40 MG tablet, Take 1 tablet (40 mg total) by mouth daily., Disp: 90 tablet, Rfl: 3 .  lisinopril (PRINIVIL,ZESTRIL) 20 MG tablet, Take 1 tablet (20 mg total) by mouth daily. HOLD UNTIL YOUR APPT WITH DR Glynda Jaeger, Disp: 30 tablet, Rfl: 1 .  magnesium oxide (MAG-OX) 400 MG tablet, Take 400 mg by mouth daily., Disp: , Rfl:  .  Omega-3 Fatty Acids (FISH OIL) 1200 MG CAPS, Take 1,000 mg by mouth daily. , Disp: , Rfl:  .  potassium chloride 20 MEQ TBCR, Take 20 mEq by mouth daily., Disp: 90 tablet, Rfl: 3 .  pyridoxine (B-6) 200 MG tablet, Take 200 mg by mouth daily., Disp: , Rfl:  .  vitamin C (ASCORBIC ACID) 500 MG  tablet, Take 1,000 mg by mouth 2 (two) times daily., Disp: , Rfl:   Past Medical History: Past Medical History:  Diagnosis Date  . A-fib ([place])   . Allergy    Seasonal--pollen  . Anticoagulation goal of INR 2 to 3    on coumadin  . Arrhythmia   . CAD (coronary artery disease) 1999   RCA stent  . Carotid stenosis, asymptomatic    moderate RT and mild LT with carotid dopplers 10/26/11  . Colon cancer ([place]) 04/2006   Stage III--T3 N1  . COPD (chronic obstructive pulmonary disease) ([place])   . H/O cardiovascular stress test 01/13/2010   low risk scan, similar to 2008 study  . H/O echocardiogram 05/23/2009   EF >55%, mild MR, Mild TR,   . Hand foot syndrome   . Hemorrhoids   . Hyperlipidemia   . Hypertension   . Neuropathy   . Permanent atrial fibrillation   . Psoriasis   . Tubular adenoma of colon     Tobacco Use: Social History   Tobacco Use  Smoking Status Former Smoker  . Years: 30.00  . Last attempt to quit: 11/25/1993  . Years since quitting: 24.5  Smokeless Tobacco Never Used    Labs: Recent Review [employer]  for [facility] Cardiac and Pulmonary Rehab Latest Ref Rng & Units 01/28/2018 01/28/2018 01/28/2018 01/28/2018 01/29/2018   Cholestrol 0 - 200 mg/dL - - - - -   LDLCALC 0 - 99 mg/dL - - - - -   HDL >40 mg/dL - - - - -   Trlycerides <150 mg/dL - - - - -   Hemoglobin A1c 4.8 - 5.6 % - - - - -   PHART 7.350 - 7.450 7.388 7.345(L) 7.347(L) - -   PCO2ART 32.0 - 48.0 mmHg 37.1 44.2 44.1 - -   HCO3 20.0 - 28.0 mmol/L 22.7 24.1 24.3 - -   TCO2 22 - 32 mmol/L _0 ACIDBASEDEF 0.0 - 2.0 mmol/L 2.0 2.0 2.0 - -   O2SAT % 98.0 98.0 99.0 - -      Capillary Blood Glucose: Lab Results  Component Value Date   GLUCAP 97 01/30/2018   GLUCAP 138 (H) 01/29/2018   GLUCAP 127 (H) 01/29/2018   GLUCAP 122 (H) 01/29/2018   GLUCAP 112 (H) 01/29/2018     Exercise Target Goals: Exercise Program Goal: Individual exercise prescription set using results from  initial 6 min walk test and THRR while considering  patient's activity barriers and safety.   Exercise Prescription Goal: Initial exercise prescription builds to 30-45 minutes a day of aerobic activity, 2-3 days per week.  Home exercise guidelines will be given to patient during program as part of exercise prescription that the participant will acknowledge.  Activity Barriers & Risk Stratification: Activity Barriers & Cardiac Risk Stratification - 04/08/18 1359    Activity Barriers & Cardiac Risk Stratification          Activity Barriers  None    Cardiac Risk Stratification  High           6 Minute Walk: 6 Minute Walk    6 Minute Walk    Row Name 04/08/18 1456   Phase  Initial   Distance  1528 feet   Walk Time  6 minutes   # of Rest Breaks  0   MPH  2.89   METS  3.22   RPE  11   VO2 Peak  11.26   Symptoms  Yes (comment)   Comments  left ankle pain   Resting HR  71 bpm   Resting BP  136/83   Resting Oxygen Saturation   97 %   Exercise Oxygen Saturation  during 6 min walk  96 %   Max Ex. HR  108 bpm   Max Ex. BP  146/82   2 Minute Post BP  140/83          Oxygen Initial Assessment:   Oxygen Re-Evaluation:   Oxygen Discharge (Final Oxygen Re-Evaluation):   Initial Exercise Prescription: Initial Exercise Prescription - 04/08/18 1400    Date of Initial Exercise RX and Referring Provider          Date  04/08/18    Referring Provider  [name], MD    Expected Discharge Date  07/14/18        Treadmill          MPH  2.5    Grade  0    Minutes  10    METs  2.91        Bike          Level  1.2    Minutes  10    METs  3.16  NuStep          Level  3    SPM  85    Minutes  10    METs  2.8        Prescription Details          Frequency (times per week)  3    Duration  Progress to 30 minutes of continuous aerobic without signs/symptoms of physical distress        Intensity          THRR 40-80% of Max Heartrate  58-116    Ratings  of Perceived Exertion  11-13    Perceived Dyspnea  0-4        Progression          Progression  Continue to progress workloads to maintain intensity without signs/symptoms of physical distress.        Resistance Training          Training Prescription  Yes    Weight  4lbs    Reps  10-15           Perform Capillary Blood Glucose checks as needed.  Exercise Prescription Changes: Exercise Prescription Changes    Response to Exercise    Row Name 04/21/18 1500 05/12/18 1400 05/14/18 1500 05/30/18 0836   Blood Pressure (Admit)  120/64  122/70  no documentation  132/72   Blood Pressure (Exercise)  132/80  186/80  no documentation  138/78   Blood Pressure (Exit)  120/68  140/72  no documentation  138/78   Heart Rate (Admit)  91 bpm  90 bpm  no documentation  85 bpm   Heart Rate (Exercise)  142 bpm  125 bpm  no documentation  103 bpm   Heart Rate (Exit)  82 bpm  89 bpm  no documentation  69 bpm   Rating of Perceived Exertion (Exercise)  11  11  no documentation  11   Symptoms  Left leg pain.   no documentation  no documentation  None   Duration  Progress to 30 minutes of  aerobic without signs/symptoms of physical distress  Progress to 30 minutes of  aerobic without signs/symptoms of physical distress  no documentation  Progress to 30 minutes of  aerobic without signs/symptoms of physical distress   Intensity  no documentation  no documentation  no documentation  THRR unchanged       Progression    Row Name 04/21/18 1500 05/12/18 1400 05/14/18 1500 05/30/18 0836   Progression  Continue to progress workloads to maintain intensity without signs/symptoms of physical distress.  Continue to progress workloads to maintain intensity without signs/symptoms of physical distress.  no documentation  Continue to progress workloads to maintain intensity without signs/symptoms of physical distress.   Average METs  2.9  2.9  no documentation  2.8       Resistance Training    Row Name 04/21/18  1500 05/12/18 1400 05/14/18 1500 05/30/18 0836   Training Prescription  No  Yes  no documentation  Yes   Weight  no documentation  8 lbs.  no documentation  8 lbs.   Reps  no documentation  10-15  no documentation  10-15   Time  no documentation  10 Minutes  no documentation  10 Minutes       Treadmill    Row Name 04/21/18 1500 05/12/18 1400 05/14/18 1500 05/30/18 0836   MPH  2.5  2.5  no documentation  2.5   Grade  0  0  no documentation  0   Minutes  10  10  no documentation  10   METs  2.91  2.91  no documentation  2.91       Bike    Row Name 04/21/18 1500 05/12/18 1400 05/14/18 1500 05/30/18 0836   Level  1.2  1.2  no documentation  no documentation   Minutes  10  10  no documentation  no documentation   METs  3.16  3.14  no documentation  no documentation       [place] Name 04/21/18 1500 05/12/18 1400 05/14/18 1500 05/30/18 0836   Level  no documentation  no documentation  no documentation  4   Watts  no documentation  no documentation  no documentation  39   Minutes  no documentation  no documentation  no documentation  10   METs  no documentation  no documentation  no documentation  3.2       NuStep    Row Name 04/21/18 1500 05/12/18 1400 05/14/18 1500 05/30/18 0836   Level  3  3  no documentation  5   SPM  85  85  no documentation  85   Minutes  10  10  no documentation  10   METs  2.7  2.8  no documentation  2.4       Home Exercise Plan    [place] Name 04/21/18 1500 05/12/18 1400 05/14/18 1500 05/30/18 0836   Plans to continue exercise at  no documentation  no documentation  Home (comment)  Home (comment)   Frequency  no documentation  no documentation  Add 3 additional days to program exercise sessions.  Add 3 additional days to program exercise sessions.   Initial Home Exercises Provided  no documentation  no documentation  05/14/18  05/14/18          Exercise Comments: Exercise Comments    Row Name 04/24/18 1101 05/12/18 1500 05/14/18 1519  05/20/18 1115 06/06/18 0839   Exercise Comments  Pt has been absent form the program. Will follow up with Pt.   Pt has returned after absence. Pt tolerated exercise well.  Reviewed HEP with Pt. Pt was responsive and understands goals.   Reviewed METs and goals with Pt. Pt is tolerating exercise well and is exercising at home in addition to the program.   Unable to review METs and goals with Pt due to closure of Cardiac Rehab for [place].       Exercise Goals and Review: Exercise Goals    Exercise Goals    Row Name 04/08/18 1337   Increase Physical Activity  Yes   Intervention  Provide advice, education, support and counseling about physical activity/exercise needs.;Develop an individualized exercise prescription for aerobic and resistive training based on initial evaluation findings, risk stratification, comorbidities and participant's personal goals.   Expected Outcomes  Short Term: Attend rehab on a regular basis to increase amount of physical activity.   Increase Strength and Stamina  Yes   Intervention  Provide advice, education, support and counseling about physical activity/exercise needs.;Develop an individualized exercise prescription for aerobic and resistive training based on initial evaluation findings, risk stratification, comorbidities and participant's personal goals.   Expected Outcomes  Short Term: Increase workloads from initial exercise prescription for resistance, speed, and METs.   Able to understand and use rate of perceived exertion (RPE) scale  Yes   Intervention  Provide education and explanation on how to use  RPE scale   Expected Outcomes  Short Term: Able to use RPE daily in rehab to express subjective intensity level;Long Term:  Able to use RPE to guide intensity level when exercising independently   Knowledge and understanding of Target Heart Rate Range (THRR)  Yes   Intervention  Provide education and explanation of THRR including how the numbers were predicted and  where they are located for reference   Expected Outcomes  Short Term: Able to state/look up THRR;Long Term: Able to use THRR to govern intensity when exercising independently;Short Term: Able to use daily as guideline for intensity in rehab   Able to check pulse independently  Yes   Intervention  Provide education and demonstration on how to check pulse in carotid and radial arteries.;Review the importance of being able to check your own pulse for safety during independent exercise   Expected Outcomes  Short Term: Able to explain why pulse checking is important during independent exercise;Long Term: Able to check pulse independently and accurately   Understanding of Exercise Prescription  Yes   Intervention  Provide education, explanation, and written materials on patient's individual exercise prescription   Expected Outcomes  Short Term: Able to explain program exercise prescription;Long Term: Able to explain home exercise prescription to exercise independently          Exercise Goals Re-Evaluation : Exercise Goals Re-Evaluation    Exercise Goal Re-Evaluation    Row Name 04/24/18 1058 05/12/18 1459 05/14/18 1518 05/20/18 1111 06/06/18 0838   Exercise Goals Review  Increase Physical Activity;Increase Strength and Stamina;Able to understand and use rate of perceived exertion (RPE) scale;Knowledge and understanding of Target Heart Rate Range (THRR);Understanding of Exercise Prescription  Increase Physical Activity;Increase Strength and Stamina;Able to understand and use rate of perceived exertion (RPE) scale;Knowledge and understanding of Target Heart Rate Range (THRR);Understanding of Exercise Prescription  Increase Physical Activity;Increase Strength and Stamina;Able to understand and use rate of perceived exertion (RPE) scale;Knowledge and understanding of Target Heart Rate Range (THRR);Understanding of Exercise Prescription  Increase Physical Activity;Increase Strength and Stamina;Able to  understand and use rate of perceived exertion (RPE) scale;Knowledge and understanding of Target Heart Rate Range (THRR);Understanding of Exercise Prescription  Increase Physical Activity;Increase Strength and Stamina;Able to understand and use rate of perceived exertion (RPE) scale;Knowledge and understanding of Target Heart Rate Range (THRR);Understanding of Exercise Prescription   Comments  Pt has been absent form the program. Pt has completed two sessions and has a MET level of 2.9. Will follow up with Pt.   Pt has returned to cardiac Rehab after being absent for a period of time. Pt tolerated exercise well. Will increase workloads as tolerated.  Reviewed HEP with Pt. Pt understands RPE scale, weather precautions, THRR, warm up and cool down exercises, and end points ofd exericse. Pt is walking at home for 30-45 minutes 5-7 days per week.   Reviewed METs and goals with Pt. Pt MET level is 2.9. Pt is tolerating eercise well and is increasing workloads. Pt attendance has increased. Pt is exercising at home 2-4 days per week in addition to Cardiac Rehab.   Unable to review METs and goals with Pt due to closure of Cardiac Rehab for [place]. Pt MET level is 2.8. Pt is progressing well.    Expected Outcomes  Will continue to monitor and progress Pt as tolerated.   Will continue to monitor and progress Pt as tolerated.   Will continue to monitor and progress Pt as tolerated.   Will continue to monitor  and progress Pt as tolerated.   Will continue to monitor and progress Pt as tolerated.           Discharge Exercise Prescription (Final Exercise Prescription Changes): Exercise Prescription Changes - 05/30/18 0836    Response to Exercise          Blood Pressure (Admit)  132/72    Blood Pressure (Exercise)  138/78    Blood Pressure (Exit)  138/78    Heart Rate (Admit)  85 bpm    Heart Rate (Exercise)  103 bpm    Heart Rate (Exit)  69 bpm    Rating of Perceived Exertion (Exercise)  11    Symptoms  None     Duration  Progress to 30 minutes of  aerobic without signs/symptoms of physical distress    Intensity  THRR unchanged        Progression          Progression  Continue to progress workloads to maintain intensity without signs/symptoms of physical distress.    Average METs  2.8        Resistance Training          Training Prescription  Yes    Weight  8 lbs.    Reps  10-15    Time  10 Minutes        Treadmill          MPH  2.5    Grade  0    Minutes  10    METs  2.91        Recumbant Bike          Level  4    Watts  39    Minutes  10    METs  3.2        NuStep          Level  5    SPM  85    Minutes  10    METs  2.4        Home Exercise Plan          Plans to continue exercise at  Home (comment)    Frequency  Add 3 additional days to program exercise sessions.    Initial Home Exercises Provided  05/14/18           Nutrition:  Target Goals: Understanding of nutrition guidelines, daily intake of sodium <1568m, cholesterol <2060m calories 30% from fat and 7% or less from saturated fats, daily to have 5 or more servings of fruits and vegetables.  Biometrics: Pre Biometrics - 04/08/18 1441    Pre Biometrics          Height  6' 2.5" (1.892 m)    Weight  230 lb 6.1 oz (104.5 kg)    Waist Circumference  42.75 inches    Hip Circumference  42 inches    Waist to Hip Ratio  1.02 %    BMI (Calculated)  29.19    Triceps Skinfold  9 mm    % Body Fat  26.9 %    Grip Strength  44 kg    Flexibility  7 in    Single Leg Stand  1.88 seconds            Nutrition Therapy Plan and Nutrition Goals: Nutrition Therapy & Goals - 04/08/18 1608    Nutrition Therapy          Diet  heart healthy        Personal Nutrition Goals  Nutrition Goal  Pt to identify and limit food sources of saturated fat, trans fat, refined carbohydrates and sodium        Intervention Plan          Intervention  Prescribe, educate and counsel regarding individualized  specific dietary modifications aiming towards targeted core components such as weight, hypertension, lipid management, diabetes, heart failure and other comorbidities.    Expected Outcomes  Short Term Goal: Understand basic principles of dietary content, such as calories, fat, sodium, cholesterol and nutrients.;Long Term Goal: Adherence to prescribed nutrition plan.           Nutrition Assessments: Nutrition Assessments - 04/08/18 1612    MEDFICTS Scores          Pre Score  15           Nutrition Goals Re-Evaluation: Nutrition Goals Re-Evaluation    Goals    Row Name 04/08/18 1608   Current Weight  230 lb 6.1 oz (104.5 kg)          Nutrition Goals Re-Evaluation: Nutrition Goals Re-Evaluation    Goals    Row Name 04/08/18 1608   Current Weight  230 lb 6.1 oz (104.5 kg)          Nutrition Goals Discharge (Final Nutrition Goals Re-Evaluation): Nutrition Goals Re-Evaluation - 04/08/18 1608    Goals          Current Weight  230 lb 6.1 oz (104.5 kg)           Psychosocial: Target Goals: Acknowledge presence or absence of significant depression and/or stress, maximize coping skills, provide positive support system. Participant is able to verbalize types and ability to use techniques and skills needed for reducing stress and depression.  Initial Review & Psychosocial Screening: Initial Psych Review & Screening - 04/08/18 1532    Initial Review          Current issues with  None Identified        Family Dynamics          Good Support System?  Yes   Fraser Din has his wife for support       Barriers          Psychosocial barriers to participate in program  There are no identifiable barriers or psychosocial needs.        Screening Interventions          Interventions  Encouraged to exercise           Quality of Life Scores: Quality of Life - 04/08/18 1337    Quality of Life          Select  Quality of Life        Quality of Life Scores           Health/Function Pre  26.13 %    Socioeconomic Pre  27.36 %    Psych/Spiritual Pre  24.64 %    Family Pre  30 %    GLOBAL Pre  26.65 %          Scores of 19 and below usually indicate a poorer quality of life in these areas.  A difference of  2-3 points is a clinically meaningful difference.  A difference of 2-3 points in the total score of the Quality of Life Index has been associated with significant improvement in overall quality of life, self-image, physical symptoms, and general health in studies assessing change in quality of life.  PHQ-9: Recent Review Flowsheet Data  Depression screen [facility] 2/9 04/14/2018   Decreased Interest 0   Down, Depressed, Hopeless 0   PHQ - 2 Score 0     Interpretation of Total Score  Total Score Depression Severity:  1-4 = Minimal depression, 5-9 = Mild depression, 10-14 = Moderate depression, 15-19 = Moderately severe depression, 20-27 = Severe depression   Psychosocial Evaluation and Intervention: Psychosocial Evaluation - 04/14/18 1528    Psychosocial Evaluation & Interventions          Interventions  Encouraged to exercise with the program and follow exercise prescription    Comments  no psychosocial needs identified, no interventions necessary.  pt enjoys woodwokring, fishing, riding trail bikes, reading and cooking.      Expected Outcomes  pt will exhibit positive outlook with good coping skills.     Continue Psychosocial Services   No Follow up required           Psychosocial Re-Evaluation: Psychosocial Re-Evaluation    Psychosocial Re-Evaluation    [place] Name 04/24/18 1006 05/20/18 1351 06/04/18 1205   Current issues with  None Identified  None Identified  None Identified   Comments  no psychosocial needs identified, no interventions necessary.   no psychosocial needs identified, no interventions necessary.   no psychosocial needs identified, no interventions necessary.    Expected Outcomes  pt will exhibit positive outlook with good  coping skills.   pt will exhibit positive outlook with good coping skills.   pt will exhibit positive outlook with good coping skills.    Interventions  Encouraged to attend Cardiac Rehabilitation for the exercise  Encouraged to attend Cardiac Rehabilitation for the exercise  Encouraged to attend Cardiac Rehabilitation for the exercise   Continue Psychosocial Services   No Follow up required  No Follow up required  No Follow up required          Psychosocial Discharge (Final Psychosocial Re-Evaluation): Psychosocial Re-Evaluation - 06/04/18 1205    Psychosocial Re-Evaluation          Current issues with  None Identified    Comments  no psychosocial needs identified, no interventions necessary.     Expected Outcomes  pt will exhibit positive outlook with good coping skills.     Interventions  Encouraged to attend Cardiac Rehabilitation for the exercise    Continue Psychosocial Services   No Follow up required           Vocational Rehabilitation: Provide vocational rehab assistance to qualifying candidates.   Vocational Rehab Evaluation & Intervention: Vocational Rehab - 04/08/18 1536    Initial Vocational Rehab Evaluation & Intervention          Assessment shows need for Vocational Rehabilitation  No           Education: Education Goals: Education classes will be provided on a weekly basis, covering required topics. Participant will state understanding/return demonstration of topics presented.  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  Instructors address importance of being able to find  your pulse and how to count your pulse when at home without a heart monitor.  Patients get hands on experience  counting their pulse with staff help and individually.   Heart Attack, Angina, and Risk Factor Modification:  -Group instruction provided by verbal instruction, video, and written materials to support subject.  Instructors address signs and symptoms of angina and heart attacks.    Also discuss risk factors for heart disease and how to make changes to improve heart health risk factors.   Functional Fitness:  -Group instruction provided by verbal instruction, demonstration, patient participation, and written materials to support subject.  Instructors address safety measures for doing things around the house.  Discuss how to get up and down off the floor, how to pick things up properly, how to safely get out of a chair without assistance, and balance training.   Meditation and Mindfulness:  -Group instruction provided by verbal instruction, patient participation, and written materials to support subject.  Instructor addresses importance of mindfulness and meditation practice to help reduce stress and improve awareness.  Instructor also leads participants through a meditation exercise.    Stretching for Flexibility and Mobility:  -Group instruction provided by verbal instruction, patient participation, and written materials to support subject.  Instructors lead participants through series of stretches that are designed to increase flexibility thus improving mobility.  These stretches are additional exercise for major muscle groups that are typically performed during regular warm up and cool down.   Hands Only CPR:  -Group verbal, video, and participation provides a basic overview of AHA guidelines for community CPR. Role-play of emergencies allow participants the opportunity to practice calling for help and chest compression technique with discussion of AED use.   Hypertension: -Group  verbal and written instruction that provides a basic overview of hypertension including the most recent diagnostic guidelines, risk factor reduction with self-care instructions and medication management.    Nutrition I class: Heart Healthy Eating:  -Group instruction provided by PowerPoint slides, verbal discussion, and written materials to support subject matter. The instructor gives an explanation and review of the Therapeutic Lifestyle Changes diet recommendations, which includes a discussion on lipid goals, dietary fat, sodium, fiber, plant stanol/sterol esters, sugar, and the components of a well-balanced, healthy diet.   Nutrition II class: Lifestyle Skills:  -Group instruction provided by PowerPoint slides, verbal discussion, and written materials to support subject matter. The instructor gives an explanation and review of label reading, grocery shopping for heart health, heart healthy recipe modifications, and ways to make healthier choices when eating out.   Diabetes Question & Answer:  -Group instruction provided by PowerPoint slides, verbal discussion, and written materials to support subject matter. The instructor gives an explanation and review of diabetes co-morbidities, pre- and post-prandial blood glucose goals, pre-exercise blood glucose goals, signs, symptoms, and treatment of hypoglycemia and hyperglycemia, and foot care basics.   Diabetes Blitz:  -Group instruction provided by PowerPoint slides, verbal discussion, and written materials to support subject matter. The instructor gives an explanation and review of the physiology behind type 1 and type 2 diabetes, diabetes medications and rational behind using different medications, pre- and post-prandial blood glucose recommendations and Hemoglobin A1c goals, diabetes diet, and exercise including blood glucose guidelines for exercising safely.    Portion Distortion:  -Group instruction provided by PowerPoint slides, verbal  discussion, written materials, and food models to support subject matter. The instructor gives an explanation of serving size versus portion size, changes in portions sizes over the last 20 years, and what consists of a serving from each food group.   Stress Management:  -Group instruction provided by verbal  instruction, video, and written materials to support subject matter.  Instructors review role of stress in heart disease and how to cope with stress positively.     Exercising on Your Own:  -Group instruction provided by verbal instruction, power point, and written materials to support subject.  Instructors discuss benefits of exercise, components of exercise, frequency and intensity of exercise, and end points for exercise.  Also discuss use of nitroglycerin and activating EMS.  Review options of places to exercise outside of rehab.  Review guidelines for sex with heart disease.   Cardiac Drugs I:  -Group instruction provided by verbal instruction and written materials to support subject.  Instructor reviews cardiac drug classes: antiplatelets, anticoagulants, beta blockers, and statins.  Instructor discusses reasons, side effects, and lifestyle considerations for each drug class.   Cardiac Drugs II:  -Group instruction provided by verbal instruction and written materials to support subject.  Instructor reviews cardiac drug classes: angiotensin converting enzyme inhibitors (ACE-I), angiotensin II receptor blockers (ARBs), nitrates, and calcium channel blockers.  Instructor discusses reasons, side effects, and lifestyle considerations for each drug class.   Anatomy and Physiology of the Circulatory System:  Group verbal and written instruction and models provide basic cardiac anatomy and physiology, with the coronary electrical and arterial systems. Review of: AMI, Angina, Valve disease, Heart Failure, Peripheral Artery Disease, Cardiac Arrhythmia, Pacemakers, and the ICD.   Other  Education:  -Group or individual verbal, written, or video instructions that support the educational goals of the cardiac rehab program.   Holiday Eating Survival Tips:  -Group instruction provided by PowerPoint slides, verbal discussion, and written materials to support subject matter. The instructor gives patients tips, tricks, and techniques to help them not only survive but enjoy the holidays despite the onslaught of food that accompanies the holidays.   Knowledge Questionnaire Score: Knowledge Questionnaire Score - 04/08/18 1337    Knowledge Questionnaire Score          Pre Score  18/24           Core Components/Risk Factors/Patient Goals at Admission: Personal Goals and Risk Factors at Admission - 04/08/18 1449    Core Components/Risk Factors/Patient Goals on Admission           Weight Management  Yes;Weight Maintenance    Intervention  Weight Management: Develop a combined nutrition and exercise program designed to reach desired caloric intake, while maintaining appropriate intake of nutrient and fiber, sodium and fats, and appropriate energy expenditure required for the weight goal.;Weight Management: Provide education and appropriate resources to help participant work on and attain dietary goals.;Weight Management/Obesity: Establish reasonable short term and long term weight goals.    Admit Weight  230 lb 6.1 oz (104.5 kg)    Expected Outcomes  Short Term: Continue to assess and modify interventions until short term weight is achieved;Long Term: Adherence to nutrition and physical activity/exercise program aimed toward attainment of established weight goal;Weight Maintenance: Understanding of the daily nutrition guidelines, which includes 25-35% calories from fat, 7% or less cal from saturated fats, less than 249m cholesterol, less than 1.5gm of sodium, & 5 or more servings of fruits and vegetables daily;Understanding recommendations for meals to include 15-35% energy as protein,  25-35% energy from fat, 35-60% energy from carbohydrates, less than 2068mof dietary cholesterol, 20-35 gm of total fiber daily;Understanding of distribution of calorie intake throughout the day with the consumption of 4-5 meals/snacks    Hypertension  Yes    Intervention  Provide education on lifestyle modifcations  including regular physical activity/exercise, weight management, moderate sodium restriction and increased consumption of fresh fruit, vegetables, and low fat dairy, alcohol moderation, and smoking cessation.;Monitor prescription use compliance.    Expected Outcomes  Short Term: Continued assessment and intervention until BP is < 140/74m HG in hypertensive participants. < 130/835mHG in hypertensive participants with diabetes, heart failure or chronic kidney disease.;Long Term: Maintenance of blood pressure at goal levels.    Lipids  Yes    Intervention  Provide education and support for participant on nutrition & aerobic/resistive exercise along with prescribed medications to achieve LDL <7066mHDL >54m53m  Expected Outcomes  Short Term: Participant states understanding of desired cholesterol values and is compliant with medications prescribed. Participant is following exercise prescription and nutrition guidelines.;Long Term: Cholesterol controlled with medications as prescribed, with individualized exercise RX and with personalized nutrition plan. Value goals: LDL < 70mg42mL > 40 mg.    Stress  Yes    Intervention  Offer individual and/or small group education and counseling on adjustment to heart disease, stress management and health-related lifestyle change. Teach and support self-help strategies.;Refer participants experiencing significant psychosocial distress to appropriate mental health specialists for further evaluation and treatment. When possible, include family members and significant others in education/counseling sessions.    Expected Outcomes  Short Term: Participant  demonstrates changes in health-related behavior, relaxation and other stress management skills, ability to obtain effective social support, and compliance with psychotropic medications if prescribed.;Long Term: Emotional wellbeing is indicated by absence of clinically significant psychosocial distress or social isolation.           Core Components/Risk Factors/Patient Goals Review:  Goals and Risk Factor Review    Core Components/Risk Factors/Patient Goals Review    Row Name 04/14/18 1529 05/20/18 1351 06/04/18 1205   Personal Goals Review  Weight Management/Obesity;Hypertension;Lipids;Stress  Weight Management/Obesity;Hypertension;Lipids;Stress  Weight Management/Obesity;Hypertension;Lipids;Stress   Review  pt with mulltiple CAD RF demonstrates eagerness to participate in CR activities. pt personal goals are to resume previous weight lifting routine dead weights and powerlifts pt desires to delay aging effects on muscles, joints and mental ability  pt with mulltiple CAD RF demonstrates eagerness to participate in CR activities. pt personal goals are to resume previous weight lifting routine dead weights and powerlifts pt desires to delay aging effects on muscles, joints and mental ability.  pt very eager to reume using weights safely.    pt with mulltiple CAD RF demonstrates eagerness to participate in CR activities. pt personal goals are to resume previous weight lifting routine dead weights and powerlifts pt desires to delay aging effects on muscles, joints and mental ability.  pt very eager to reume using weights safely.   pt currently on hold for departmental closing for COVID 19 precautions.    Expected Outcomes  pt will participate in CR exercise, nutrition and lifestyle modification opportunities.   pt will participate in CR exercise, nutrition and lifestyle modification opportunities.   pt will participate in CR exercise, nutrition and lifestyle modification opportunities.           Core  Components/Risk Factors/Patient Goals at Discharge (Final Review):  Goals and Risk Factor Review - 06/04/18 1205    Core Components/Risk Factors/Patient Goals Review          Personal Goals Review  Weight Management/Obesity;Hypertension;Lipids;Stress    Review  pt with mulltiple CAD RF demonstrates eagerness to participate in CR activities. pt personal goals are to resume previous weight lifting routine dead weights and powerlifts pt  desires to delay aging effects on muscles, joints and mental ability.  pt very eager to reume using weights safely.   pt currently on hold for departmental closing for COVID 19 precautions.     Expected Outcomes  pt will participate in CR exercise, nutrition and lifestyle modification opportunities.            ITP Comments: ITP Comments    Row Name 04/08/18 1333 04/14/18 1526 04/24/18 1005 05/20/18 1350 06/04/18 1204   ITP Comments  Medical Director- Dr. Fransico Him, MD  pt started group exercise program. pt tolerated light activity without difficulty. pt oriented to exercise eqiupment and safety routine. understanding verbalized.   30 day ITP review.  pt currently on medical hold for cellulitis being treated by PCP, Dr. [name].   30 day ITP review. pt has returned to cardiac rehab with completion of cellulitis treatment. pt demonstrates eagerness to participate in CR program.  30 day ITP review.  pt demonstrates eagerness to participate in CR program. pt currently on hold for departmental closing for COVID 19 precautions.       Comments:  Andi Hence, RN, BSN Cardiac Pulmonary Rehab

## 2018-06-11 ENCOUNTER — Ambulatory Visit (HOSPITAL_COMMUNITY): Payer: Medicare Other

## 2018-06-11 ENCOUNTER — Encounter (HOSPITAL_COMMUNITY): Payer: Medicare Other

## 2018-06-13 ENCOUNTER — Ambulatory Visit (HOSPITAL_COMMUNITY): Payer: Medicare Other

## 2018-06-13 ENCOUNTER — Encounter (HOSPITAL_COMMUNITY): Payer: Medicare Other

## 2018-06-16 ENCOUNTER — Ambulatory Visit (HOSPITAL_COMMUNITY): Payer: Medicare Other

## 2018-06-16 ENCOUNTER — Encounter (HOSPITAL_COMMUNITY): Payer: Medicare Other

## 2018-06-18 ENCOUNTER — Ambulatory Visit (HOSPITAL_COMMUNITY): Payer: Medicare Other

## 2018-06-18 ENCOUNTER — Encounter (HOSPITAL_COMMUNITY): Payer: Medicare Other

## 2018-06-20 ENCOUNTER — Ambulatory Visit (HOSPITAL_COMMUNITY): Payer: Medicare Other

## 2018-06-20 ENCOUNTER — Encounter (HOSPITAL_COMMUNITY): Payer: Medicare Other

## 2018-06-23 ENCOUNTER — Encounter (HOSPITAL_COMMUNITY): Payer: Medicare Other

## 2018-06-23 ENCOUNTER — Ambulatory Visit (HOSPITAL_COMMUNITY): Payer: Medicare Other

## 2018-06-25 ENCOUNTER — Ambulatory Visit (HOSPITAL_COMMUNITY): Payer: Medicare Other

## 2018-06-25 ENCOUNTER — Encounter (HOSPITAL_COMMUNITY): Payer: Medicare Other

## 2018-06-27 ENCOUNTER — Ambulatory Visit (HOSPITAL_COMMUNITY): Payer: Medicare Other

## 2018-06-27 ENCOUNTER — Encounter (HOSPITAL_COMMUNITY): Payer: Medicare Other

## 2018-06-30 ENCOUNTER — Ambulatory Visit (HOSPITAL_COMMUNITY): Payer: Medicare Other

## 2018-06-30 ENCOUNTER — Encounter (HOSPITAL_COMMUNITY): Payer: Medicare Other

## 2018-07-02 ENCOUNTER — Ambulatory Visit (HOSPITAL_COMMUNITY): Payer: Medicare Other

## 2018-07-02 ENCOUNTER — Encounter (HOSPITAL_COMMUNITY): Payer: Medicare Other

## 2018-07-04 ENCOUNTER — Encounter (HOSPITAL_COMMUNITY): Payer: Medicare Other

## 2018-07-04 ENCOUNTER — Ambulatory Visit (HOSPITAL_COMMUNITY): Payer: Medicare Other

## 2018-07-07 ENCOUNTER — Encounter (HOSPITAL_COMMUNITY): Payer: Medicare Other

## 2018-07-07 ENCOUNTER — Ambulatory Visit (HOSPITAL_COMMUNITY): Payer: Medicare Other

## 2018-07-09 ENCOUNTER — Ambulatory Visit (HOSPITAL_COMMUNITY): Payer: Medicare Other

## 2018-07-09 ENCOUNTER — Encounter (HOSPITAL_COMMUNITY): Payer: Medicare Other

## 2018-07-11 ENCOUNTER — Encounter (HOSPITAL_COMMUNITY): Payer: Medicare Other

## 2018-07-11 ENCOUNTER — Ambulatory Visit (HOSPITAL_COMMUNITY): Payer: Medicare Other

## 2018-07-14 ENCOUNTER — Encounter (HOSPITAL_COMMUNITY): Payer: Medicare Other

## 2018-07-14 ENCOUNTER — Ambulatory Visit (HOSPITAL_COMMUNITY): Payer: Medicare Other

## 2018-07-16 ENCOUNTER — Ambulatory Visit (HOSPITAL_COMMUNITY): Payer: Medicare Other

## 2018-07-16 ENCOUNTER — Encounter (HOSPITAL_COMMUNITY): Payer: Medicare Other

## 2018-07-25 ENCOUNTER — Telehealth: Payer: Self-pay | Admitting: Cardiology

## 2018-07-25 NOTE — Telephone Encounter (Signed)
New Message          Patient is calling in today to speak to someone about his medication "Lasix". Pls call to advise.

## 2018-07-25 NOTE — Telephone Encounter (Signed)
Okay with me for him to come off Lasix and to take as needed swelling.

## 2018-07-25 NOTE — Telephone Encounter (Signed)
Patient called because he has been on his Lasix daily since coming out of the hospital- he would like to know if he can come off it, or if he can use it as needed. He states PCP Dr.Giffin put him on compression stockings and he has no swelling anymore- advised I would route a message to [facility] and nurse for recommendations.   Thank you!

## 2018-07-31 NOTE — Telephone Encounter (Signed)
Per DPR left detailed message regarding Dr. Gwenlyn Found recommendations

## 2018-08-12 ENCOUNTER — Telehealth (HOSPITAL_COMMUNITY): Payer: Self-pay

## 2018-08-12 NOTE — Telephone Encounter (Signed)
Phone call made to pt to provide information about virtual cardiac rehab. Pt did not answer. Message was left for Pt to return call.

## 2018-09-03 NOTE — Addendum Note (Signed)
Encounter addended by: Jeralyn Bennett on: 09/03/2018 11:55 AM  Actions taken: Clinical Note Signed

## 2018-09-03 NOTE — Progress Notes (Signed)
Pt never returned to CR after initial orientation. Phone calls were made to Pt as well as a letter through the mail to encourage Pt to return our call. Pt did not respond. Pt will be discharged from CR.   [place] Tannah Dreyfuss BS, ACSM CEP 09/03/2018 11:55 AM

## 2018-09-08 ENCOUNTER — Encounter (HOSPITAL_COMMUNITY): Payer: Self-pay | Admitting: *Deleted

## 2018-09-08 DIAGNOSIS — Z951 Presence of aortocoronary bypass graft: Secondary | ICD-10-CM

## 2018-09-08 DIAGNOSIS — I214 Non-ST elevation (NSTEMI) myocardial infarction: Secondary | ICD-10-CM

## 2018-09-08 NOTE — Progress Notes (Signed)
Discharge Progress Report  Patient Details  Name: DEVARIUS NELLES MRN: [id number] Date of Birth: 17-Oct-1940 Referring Provider:     [facility] from 04/08/2018 in [place]  Referring Provider  [name], MD       Number of Visits: 11  Reason for Discharge:  Early Exit:  Cardiac rehab has been closed due to the COVID 19 Pandemic  Smoking History:  Social History   Tobacco Use  Smoking Status Former Smoker  . Years: 30.00  . Quit date: 11/25/1993  . 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    Personal Goals Discharge:   Exercise Goals and Review:   Exercise Goals Re-Evaluation:   Nutrition & Weight - Outcomes:    Nutrition:   Nutrition Discharge:   Education Questionnaire Score:   Fraser Din attended 11 exercise sessions between 04/08/08-05/30/18. Cardiac rehab closed in March due to the [place] 19 Pandemic. Fraser Din was sent a letter about participating in virtual cardiac rehab. No repsonse has been heard from the patient. Will discharge from phase 2 cardiac rehab.Barnet Pall, RN,BSN 09/08/2018 12:30 PM

## 2018-12-23 ENCOUNTER — Other Ambulatory Visit: Payer: Self-pay | Admitting: Cardiovascular Disease

## 2018-12-23 DIAGNOSIS — I6523 Occlusion and stenosis of bilateral carotid arteries: Secondary | ICD-10-CM

## 2018-12-30 ENCOUNTER — Ambulatory Visit (HOSPITAL_COMMUNITY)
Admission: RE | Admit: 2018-12-30 | Discharge: 2018-12-30 | Disposition: A | Discharge status: Home / Self Care | Payer: Medicare Other | Source: Ambulatory Visit | Attending: Cardiovascular Disease | Admitting: Cardiovascular Disease

## 2018-12-30 ENCOUNTER — Other Ambulatory Visit: Payer: Self-pay | Admitting: Cardiovascular Disease

## 2018-12-30 ENCOUNTER — Other Ambulatory Visit: Payer: Self-pay

## 2018-12-30 DIAGNOSIS — I6523 Occlusion and stenosis of bilateral carotid arteries: Secondary | ICD-10-CM | POA: Diagnosis not present

## 2018-12-31 ENCOUNTER — Other Ambulatory Visit: Payer: Self-pay | Admitting: *Deleted

## 2018-12-31 ENCOUNTER — Encounter: Payer: Self-pay | Admitting: *Deleted

## 2018-12-31 DIAGNOSIS — I6523 Occlusion and stenosis of bilateral carotid arteries: Secondary | ICD-10-CM

## 2018-12-31 NOTE — Progress Notes (Signed)
vas 

## 2019-01-26 ENCOUNTER — Other Ambulatory Visit: Payer: Self-pay | Admitting: Cardiovascular Disease

## 2019-04-09 ENCOUNTER — Other Ambulatory Visit: Payer: Self-pay | Admitting: Cardiovascular Disease

## 2019-04-09 DIAGNOSIS — I4821 Permanent atrial fibrillation: Secondary | ICD-10-CM

## 2019-04-09 NOTE — Telephone Encounter (Signed)
Please review for refill. Thanks!  

## 2019-05-02 ENCOUNTER — Ambulatory Visit: Payer: Medicare Other

## 2019-05-04 ENCOUNTER — Other Ambulatory Visit: Payer: Self-pay | Admitting: Cardiovascular Disease

## 2019-05-04 ENCOUNTER — Telehealth: Payer: Self-pay | Admitting: Cardiovascular Disease

## 2019-05-04 MED ORDER — ATORVASTATIN CALCIUM 80 MG PO TABS: 80.0000 mg | ORAL_TABLET | Freq: Every day | ORAL | 0 refills | Status: DC

## 2019-05-04 NOTE — Telephone Encounter (Signed)
Patient has scheduled an appointment for 05/07/19 at 8:15 AM with Dr. Gwenlyn Found.   *STAT* If patient is at the pharmacy, call can be transferred to refill team.   1. Which medications need to be refilled? (please list name of each medication and dose if known) atorvastatin (LIPITOR) 80 MG tablet  2. Which pharmacy/location (including street and city if local pharmacy) is medication to be sent to? [place], [place]  3. Do they need a 30 day or 90 day supply? 90 day supply

## 2019-05-04 NOTE — Telephone Encounter (Signed)
Refill sent to the pharmacy electronically.  

## 2019-05-05 ENCOUNTER — Telehealth: Payer: Self-pay

## 2019-05-05 NOTE — Telephone Encounter (Signed)
LVM for pt to let them know Dr. Gwenlyn Found had to reschedule his appt on 2/18 @ 8:15 d/t schedule reasons. Asked pt to call back and reschedule appt.

## 2019-05-07 ENCOUNTER — Ambulatory Visit: Payer: Medicare Other | Admitting: Cardiovascular Disease

## 2019-05-15 ENCOUNTER — Ambulatory Visit: Payer: Medicare Other | Admitting: Cardiovascular Disease

## 2019-05-15 ENCOUNTER — Other Ambulatory Visit: Payer: Self-pay

## 2019-05-15 ENCOUNTER — Encounter: Payer: Self-pay | Admitting: Cardiovascular Disease

## 2019-05-15 DIAGNOSIS — Z9861 Coronary angioplasty status: Secondary | ICD-10-CM

## 2019-05-15 DIAGNOSIS — I6521 Occlusion and stenosis of right carotid artery: Secondary | ICD-10-CM | POA: Diagnosis not present

## 2019-05-15 DIAGNOSIS — I2510 Atherosclerotic heart disease of native coronary artery without angina pectoris: Secondary | ICD-10-CM | POA: Diagnosis not present

## 2019-05-15 DIAGNOSIS — E785 Hyperlipidemia, unspecified: Secondary | ICD-10-CM

## 2019-05-15 DIAGNOSIS — I10 Essential (primary) hypertension: Secondary | ICD-10-CM

## 2019-05-15 DIAGNOSIS — I4811 Longstanding persistent atrial fibrillation: Secondary | ICD-10-CM

## 2019-05-15 NOTE — Assessment & Plan Note (Signed)
History of essential hypertension with initial blood pressure measured today 158/57.  He does check his blood pressure at home routinely and it runs in the 120/60-70 range.  He is on lisinopril.

## 2019-05-15 NOTE — Assessment & Plan Note (Signed)
History of CAD status post RCA stenting by myself 01/11/1998.  He had normal LAD and circumflex as well as a normal EF at that time.  He underwent recatheterization in the setting of a non-STEMI by Dr. Saunders Revel revealing left main disease and underwent CABG by Dr. Cyndia Bent 01/28/2018 with a LIMA to his LAD, vein to the obtuse marginal branch, PDA and PLA sequentially.  He did have left atrial clipping at that time.  He completed cardiac rehab.  He currently denies chest pain or shortness of breath.

## 2019-05-15 NOTE — Assessment & Plan Note (Signed)
History of chronic atrial fibrillation rate controlled not on any negative chronotropic drugs but on Eliquis oral anticoagulation.

## 2019-05-15 NOTE — Progress Notes (Signed)
05/15/2019 [name]   09-25-1940  [id number]  Primary Physician Lavone Orn, MD Primary Cardiologist: Lorretta Harp MD FACP, [place], [place], [place]  HPI:  [name] is a 79 y.o. mildly overweight, married Caucasian male father of 2, grandfather of 3 grandchildren who I saw  03/25/2018. He has a history of CAD status post RCA stenting by myself January 11, 1998. He had normal circumflex, LAD and ejection fraction. His other problems include hypertension and chronic atrial fibrillation on Coumadin anticoagulation, rate controlled, as well as erectile dysfunction. He is totally asymptomatic. He did have colon cancer picked up on screening colonoscopy and underwent right colectomy May 29, 2006 followed by 11 cycles of adjuvant Folfox chemotherapy. [name] He is very active exercises 6 days a week doing weight training 3 days a week, and lower extremity training 3 times a week.[name]  He was admitted to the hospital on 01/21/2018 with non-STEMI.  He underwent cardiac catheterization 2 days later by Dr. Saunders Revel revealing left main disease but disease in his LAD and RCA as well.  He underwent CABG by Dr. Cyndia Bent on 01/28/2018 with a LIMA to his LAD, vein to an obtuse marginal branch and to the PDA and PLA sequentially.  He also had left atrial clipping at that time.    He ended up completing cardiac rehab as an outpatient.  Since I saw him a year ago he has gained 10 pounds and has been less active because of COVID-19.  He has not come back to the gym until recently.  He denies chest pain or shortness of breath.   Current Meds  Medication Sig  . Alpha Lipoic Acid 200 MG CAPS Take 600 mg by mouth 2 (two) times daily.   [name] aspirin 81 MG tablet Take 81 mg by mouth daily.  [name] atorvastatin (LIPITOR) 80 MG tablet Take 1 tablet (80 mg total) by mouth daily at 6 PM. NEED OV.  . cholecalciferol (VITAMIN D) 1000 UNITS tablet Take 1,000 Units by mouth daily.  . Coenzyme Q10 200 MG capsule Take 100 mg by mouth  2 (two) times daily.   . Cyanocobalamin (VITAMIN B 12 PO) Take 100 mg by mouth daily.   [name] ELIQUIS 5 MG TABS tablet TAKE 1 TABLET BY MOUTH TWICE DAILY.  [name] Flaxseed, Linseed, (FLAX SEED OIL PO) Take 1,400 mg by mouth daily.  . furosemide (LASIX) 40 MG tablet Take 1 tablet (40 mg total) by mouth daily.  [name] lisinopril (PRINIVIL,ZESTRIL) 20 MG tablet Take 1 tablet (20 mg total) by mouth daily. HOLD UNTIL YOUR APPT WITH DR Glynda Jaeger  . magnesium oxide (MAG-OX) 400 MG tablet Take 400 mg by mouth daily.  . Omega-3 Fatty Acids (FISH OIL) 1200 MG CAPS Take 1,000 mg by mouth daily.   . potassium chloride 20 MEQ TBCR Take 20 mEq by mouth daily.  [name] pyridoxine (B-6) 200 MG tablet Take 200 mg by mouth daily.  . vitamin C (ASCORBIC ACID) 500 MG tablet Take 1,000 mg by mouth 2 (two) times daily.     No Known Allergies  Social History   Socioeconomic History  . Marital status: Married    Spouse name: [name]  . Number of children: 2  . Years of education: Not on file  . Highest education level: Bachelor's degree (e.g., BA, AB, BS)  Occupational History  . Occupation: retired    Fish farm manager: RETIRED  Tobacco Use  . Smoking status: Former Smoker    Years: 30.00  Quit date: 11/25/1993    Years since quitting: 25.4  . Smokeless tobacco: Never Used  Substance and Sexual Activity  . Alcohol use: Yes    Alcohol/week: 1.0 standard drinks    Types: 1 Cans of beer per week  . Drug use: No  . Sexual activity: Not on file  Other Topics Concern  . Not on file  Social History Narrative  . Not on file   Social Determinants of Health   Financial Resource Strain:   . Difficulty of Paying Living Expenses: Not on file  Food Insecurity:   . Worried About Charity fundraiser in the Last Year: Not on file  . Ran Out of Food in the Last Year: Not on file  Transportation Needs:   . Lack of Transportation (Medical): Not on file  . Lack of Transportation (Non-Medical): Not on file  Physical Activity:   . Days  of Exercise per Week: Not on file  . Minutes of Exercise per Session: Not on file  Stress:   . Feeling of Stress : Not on file  Social Connections:   . Frequency of Communication with Friends and Family: Not on file  . Frequency of Social Gatherings with Friends and Family: Not on file  . Attends Religious Services: Not on file  . Active Member of Clubs or Organizations: Not on file  . Attends Archivist Meetings: Not on file  . Marital Status: Not on file  Intimate Partner Violence:   . Fear of Current or Ex-Partner: Not on file  . Emotionally Abused: Not on file  . Physically Abused: Not on file  . Sexually Abused: Not on file     Review of Systems: General: negative for chills, fever, night sweats or weight changes.  Cardiovascular: negative for chest pain, dyspnea on exertion, edema, orthopnea, palpitations, paroxysmal nocturnal dyspnea or shortness of breath Dermatological: negative for rash Respiratory: negative for cough or wheezing Urologic: negative for hematuria Abdominal: negative for nausea, vomiting, diarrhea, bright red blood per rectum, melena, or hematemesis Neurologic: negative for visual changes, syncope, or dizziness All other systems reviewed and are otherwise negative except as noted above.    Blood pressure (!) 151/64, pulse (!) 53, height 6\' 3"  (1.905 m), weight 239 lb 6.4 oz (108.6 kg), SpO2 97 %.  General appearance: alert and no distress Neck: no adenopathy, no carotid bruit, no JVD, supple, symmetrical, trachea midline and thyroid not enlarged, symmetric, no tenderness/mass/nodules Lungs: clear to auscultation bilaterally Heart: regular rate and rhythm, S1, S2 normal, no murmur, click, rub or gallop Extremities: extremities normal, atraumatic, no cyanosis or edema Pulses: 2+ and symmetric Skin: Skin color, texture, turgor normal. No rashes or lesions Neurologic: Alert and oriented X 3, normal strength and tone. Normal symmetric reflexes.  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  I personally reviewed this EKG.  ASSESSMENT AND PLAN:   Essential hypertension History of essential hypertension with initial blood pressure measured today 158/57.  He does check his blood pressure at home routinely and it runs in the 120/60-70 range.  He is on lisinopril.  Carotid stenosis, asymptomatic History of moderate right ICA stenosis unchanged by recent Doppler study performed 12/30/2018.  This will be rechecked in 1 year.  CAD S/P percutaneous coronary angioplasty History of CAD status post RCA stenting by myself 01/11/1998.  He had normal LAD and circumflex as well as  a normal EF at that time.  He underwent recatheterization in the setting of a non-STEMI by Dr. Saunders Revel revealing left main disease and underwent CABG by Dr. Cyndia Bent 01/28/2018 with a LIMA to his LAD, vein to the obtuse marginal branch, PDA and PLA sequentially.  He did have left atrial clipping at that time.  He completed cardiac rehab.  He currently denies chest pain or shortness of breath.  Dyslipidemia, goal LDL below 70 History of CAD on statin therapy with lipid profile performed 11/11/2018 revealing total cholesterol 136, LDL of 57 and HDL 49.  Atrial fibrillation (HCC) History of chronic atrial fibrillation rate controlled not on any negative chronotropic drugs but on Eliquis oral anticoagulation.      Lorretta Harp MD FACP,FACC,FAHA, FSCAI 05/15/2019 10:00 AM

## 2019-05-15 NOTE — Assessment & Plan Note (Signed)
History of CAD on statin therapy with lipid profile performed 11/11/2018 revealing total cholesterol 136, LDL of 57 and HDL 49.

## 2019-05-15 NOTE — Patient Instructions (Signed)
Medication Instructions:  Your physician recommends that you continue on your current medications as directed. Please refer to the Current Medication list given to you today.  If you need a refill on your cardiac medications before your next appointment, please call your pharmacy.   Lab work: NONE  Testing/Procedures: Your physician has requested that you have a carotid duplex in October 2021. This test is an ultrasound of the carotid arteries in your neck. It looks at blood flow through these arteries that supply the brain with blood. Allow one hour for this exam. There are no restrictions or special instructions.   Follow-Up: At [facility], you and your health needs are our priority.  As part of our continuing mission to provide you with exceptional heart care, we have created designated Provider Care Teams.  These Care Teams include your primary Cardiologist (physician) and Advanced Practice Providers (APPs -  Physician Assistants and Nurse Practitioners) who all work together to provide you with the care you need, when you need it. You may see [name], MD or one of the following Advanced Practice Providers on your designated Care Team:    [name], PA-C  [place], [place]  Coletta Memos, [place]  Your physician wants you to follow-up in: 1 year with Dr. Gwenlyn Found. You will receive a reminder letter in the mail two months in advance. If you don't receive a letter, please call our office to schedule the follow-up appointment.

## 2019-05-15 NOTE — Assessment & Plan Note (Signed)
History of moderate right ICA stenosis unchanged by recent Doppler study performed 12/30/2018.  This will be rechecked in 1 year.

## 2019-05-18 ENCOUNTER — Ambulatory Visit: Payer: Medicare Other | Attending: Internal Medicine

## 2019-05-18 DIAGNOSIS — Z23 Encounter for immunization: Secondary | ICD-10-CM | POA: Insufficient documentation

## 2019-05-18 NOTE — Progress Notes (Signed)
   Covid-19 Vaccination Clinic  Name:  [name]    MRN: [id number] DOB: May 03, 1940  05/18/2019  [name] was observed post Covid-19 immunization for 15 minutes without incidence. He was provided with Vaccine Information Sheet and instruction to access the V-Safe system.   [name] was instructed to call 911 with any severe reactions post vaccine: [name] Difficulty breathing  . Swelling of your face and throat  . A fast heartbeat  . A bad rash all over your body  . Dizziness and weakness    Immunizations Administered    Name Date Dose VIS Date Route   Pfizer COVID-19 Vaccine 05/18/2019 10:02 AM 0.3 mL 02/27/2019 Intramuscular   Manufacturer: [place]   Lot: [id number]   [place]: [id number]

## 2019-06-16 ENCOUNTER — Ambulatory Visit: Payer: Medicare Other | Attending: Internal Medicine

## 2019-06-16 DIAGNOSIS — Z23 Encounter for immunization: Secondary | ICD-10-CM

## 2019-06-16 NOTE — Progress Notes (Signed)
   Covid-19 Vaccination Clinic  Name:  [name]    MRN: [id number] DOB: June 18, 1940  06/16/2019  [name] was observed post Covid-19 immunization for 15 minutes without incident. He was provided with Vaccine Information Sheet and instruction to access the V-Safe system.   [name] was instructed to call 911 with any severe reactions post vaccine: [name] Difficulty breathing  . Swelling of face and throat  . A fast heartbeat  . A bad rash all over body  . Dizziness and weakness   Immunizations Administered    Name Date Dose VIS Date Route   Pfizer COVID-19 Vaccine 06/16/2019  9:41 AM 0.3 mL 02/27/2019 Intramuscular   Manufacturer: [place]   Lot: [id number]   [place]: [id number]

## 2019-06-23 ENCOUNTER — Other Ambulatory Visit: Payer: Self-pay | Admitting: Cardiovascular Disease

## 2019-06-23 DIAGNOSIS — I4821 Permanent atrial fibrillation: Secondary | ICD-10-CM

## 2019-08-12 ENCOUNTER — Emergency Department (HOSPITAL_COMMUNITY): Payer: Medicare Other

## 2019-08-12 ENCOUNTER — Other Ambulatory Visit: Payer: Self-pay

## 2019-08-12 ENCOUNTER — Emergency Department (HOSPITAL_COMMUNITY)
Admission: EM | Admit: 2019-08-12 | Discharge: 2019-08-12 | Disposition: A | Discharge status: Home / Self Care | Payer: Medicare Other | Attending: Emergency Medicine | Admitting: Emergency Medicine

## 2019-08-12 ENCOUNTER — Encounter (HOSPITAL_COMMUNITY): Payer: Self-pay | Admitting: Emergency Medicine

## 2019-08-12 DIAGNOSIS — Y92017 Garden or yard in single-family (private) house as the place of occurrence of the external cause: Secondary | ICD-10-CM | POA: Insufficient documentation

## 2019-08-12 DIAGNOSIS — Y999 Unspecified external cause status: Secondary | ICD-10-CM | POA: Insufficient documentation

## 2019-08-12 DIAGNOSIS — I2510 Atherosclerotic heart disease of native coronary artery without angina pectoris: Secondary | ICD-10-CM | POA: Insufficient documentation

## 2019-08-12 DIAGNOSIS — Z951 Presence of aortocoronary bypass graft: Secondary | ICD-10-CM | POA: Diagnosis not present

## 2019-08-12 DIAGNOSIS — Y93H2 Activity, gardening and landscaping: Secondary | ICD-10-CM | POA: Insufficient documentation

## 2019-08-12 DIAGNOSIS — W268XXA Contact with other sharp object(s), not elsewhere classified, initial encounter: Secondary | ICD-10-CM | POA: Insufficient documentation

## 2019-08-12 DIAGNOSIS — Z85038 Personal history of other malignant neoplasm of large intestine: Secondary | ICD-10-CM | POA: Diagnosis not present

## 2019-08-12 DIAGNOSIS — Z87891 Personal history of nicotine dependence: Secondary | ICD-10-CM | POA: Diagnosis not present

## 2019-08-12 DIAGNOSIS — S81811A Laceration without foreign body, right lower leg, initial encounter: Secondary | ICD-10-CM

## 2019-08-12 DIAGNOSIS — Z7901 Long term (current) use of anticoagulants: Secondary | ICD-10-CM | POA: Insufficient documentation

## 2019-08-12 DIAGNOSIS — Z23 Encounter for immunization: Secondary | ICD-10-CM | POA: Diagnosis not present

## 2019-08-12 DIAGNOSIS — I252 Old myocardial infarction: Secondary | ICD-10-CM | POA: Insufficient documentation

## 2019-08-12 DIAGNOSIS — I10 Essential (primary) hypertension: Secondary | ICD-10-CM | POA: Insufficient documentation

## 2019-08-12 DIAGNOSIS — Z7982 Long term (current) use of aspirin: Secondary | ICD-10-CM | POA: Diagnosis not present

## 2019-08-12 MED ORDER — LIDOCAINE-EPINEPHRINE 2 %-1:100000 IJ SOLN
20.0000 mL | Freq: Once | INTRAMUSCULAR | Status: AC
  Administered 2019-08-12: 20 mL via INTRADERMAL
  Filled 2019-08-12: qty 1

## 2019-08-12 MED ORDER — TETANUS-DIPHTH-ACELL PERTUSSIS 5-2.5-18.5 LF-MCG/0.5 IM SUSP
0.5000 mL | Freq: Once | INTRAMUSCULAR | Status: AC
  Administered 2019-08-12: 0.5 mL via INTRAMUSCULAR
  Filled 2019-08-12: qty 0.5

## 2019-08-12 NOTE — ED Notes (Signed)
Suture cart at bedside 

## 2019-08-12 NOTE — Discharge Instructions (Signed)
Please take over the counter pain medication as needed for pain.  Monitor wound and cleanse it daily.  Have your sutures and staples remove in 7-10 days.  Return if you have any concerns or if you notices sign of infection.

## 2019-08-12 NOTE — ED Triage Notes (Addendum)
Per EMS right lower leg laceration from metal bracket in garden; on blood thinner.  Left wrist 20 g with EMS; given 100 mcg of fentanyl.

## 2019-08-12 NOTE — ED Notes (Signed)
EDP at bedside  

## 2019-08-12 NOTE — ED Provider Notes (Signed)
[facility] DEPT Provider Note   CSN: [id number] Arrival date & time: 08/12/19  1030     History Chief Complaint  Patient presents with  . Leg Injury    [name] is a 79 y.o. male.  The history is provided by the patient and medical records. No language interpreter was used.     79 year old male with history of atrial fibrillation currently on Eliquis, brought here via EMS for evaluation of leg injury.  Patient report for the he last 2 guarding and has been gone in for many years.  He admits that that is a metal sharp edge on one of his raised garden bed.  Today, while walking in his garden, patient accidentally raise his leg and may contact to the sharp edge causing a laceration to his right lower extremity.  Report acute onset of sharp pain, moderate in intensity and noticed a deep wound to his right lower extremity.  EMS was contacted, pain did improved with pain medication administered prior to arrival.  He denies any associated numbness, no fever, no weakness.  He is not up-to-date with tetanus.  He is currently on Eliquis.  He is without any other complaint.  Past Medical History:  Diagnosis Date  . A-fib ([place])   . Allergy    Seasonal--pollen  . Anticoagulation goal of INR 2 to 3    on coumadin  . Arrhythmia   . CAD (coronary artery disease) 1999   RCA stent  . Carotid stenosis, asymptomatic    moderate RT and mild LT with carotid dopplers 10/26/11  . Colon cancer ([place]) 04/2006   Stage III--T3 N1  . COPD (chronic obstructive pulmonary disease) ([place])   . H/O cardiovascular stress test 01/13/2010   low risk scan, similar to 2008 study  . H/O echocardiogram 05/23/2009   EF >55%, mild MR, Mild TR,   . Hand foot syndrome   . Hemorrhoids   . Hyperlipidemia   . Hypertension   . Neuropathy   . Permanent atrial fibrillation ([place])   . Psoriasis   . Tubular adenoma of colon     Patient Active Problem List   Diagnosis Date Noted  . Edema  of leg 02/26/2018  . 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 NSTEMI (non-ST elevated myocardial infarction) ([place]) 01/21/2018  . History of colon cancer 08/19/2013  . Essential hypertension 11/20/2012  . Dyslipidemia, goal LDL below 70 11/20/2012  . Chronic anticoagulation   . Carotid stenosis, asymptomatic   . CAD S/P percutaneous coronary angioplasty   . Atrial fibrillation ([place]) 06/02/2012    Past Surgical History:  Procedure Laterality Date  . CARDIAC CATHETERIZATION    . CLIPPING OF ATRIAL APPENDAGE  01/28/2018   Procedure: CLIPPING OF ATRIAL APPENDAGE;  Surgeon: Gaye Pollack, MD;  Location: [facility] OR;  Service: Open Heart Surgery;;  . COLONOSCOPY    . CORONARY ARTERY BYPASS GRAFT N/A 01/28/2018   Procedure: CORONARY ARTERY BYPASS GRAFTING (CABG) X 4 USING LEFT INTERNAL MAMMARY ARTERY AND LEFT GREATER SAPHENOUS VEIN HARVESTED ENDOSCOPICALLY;  Surgeon: Gaye Pollack, MD;  Location: [facility];  Service: Open Heart Surgery;  Laterality: N/A;  . CORONARY STENT PLACEMENT  1999   RCA tandem NIR stents  . LEFT HEART CATH AND CORONARY ANGIOGRAPHY N/A 01/23/2018   Procedure: LEFT HEART CATH AND CORONARY ANGIOGRAPHY;  Surgeon: Nelva Bush, MD;  Location: [facility];  Service: Cardiovascular;  Laterality: N/A;  . RIGHT COLECTOMY  05/29/2006   Laparoscopic  assisted  . TEE WITHOUT CARDIOVERSION N/A 01/28/2018   Procedure: TRANSESOPHAGEAL ECHOCARDIOGRAM (TEE);  Surgeon: Gaye Pollack, MD;  Location: [facility];  Service: Open Heart Surgery;  Laterality: N/A;  . TONSILLECTOMY         Family History  Problem Relation Age of Onset  . Heart disease Father 43  . Heart disease Paternal Grandmother   . Heart disease Mother   . Hypertension Mother   . Healthy Sister   . Heart disease Paternal Grandfather   . Hypertension Paternal Grandfather   . Colon cancer Neg Hx     Social History   Tobacco Use  . Smoking status: Former Smoker    Years: 30.00    Quit date: 11/25/1993    Years since  quitting: 25.7  . Smokeless tobacco: Never Used  Substance Use Topics  . Alcohol use: Yes    Alcohol/week: 1.0 standard drinks    Types: 1 Cans of beer per week  . Drug use: No    Home Medications Prior to Admission medications   Medication Sig Start Date End Date Taking? Authorizing Provider  Alpha Lipoic Acid 200 MG CAPS Take 600 mg by mouth 2 (two) times daily.     [provider]  aspirin 81 MG tablet Take 81 mg by mouth daily.    [provider]  atorvastatin (LIPITOR) 80 MG tablet TAKE 1 TABLET AT 6PM. 06/25/19   Lorretta Harp, MD  cholecalciferol (VITAMIN D) 1000 UNITS tablet Take 1,000 Units by mouth daily.    [provider]  Coenzyme Q10 200 MG capsule Take 100 mg by mouth 2 (two) times daily.     [provider]  Cyanocobalamin (VITAMIN B 12 PO) Take 100 mg by mouth daily.     [provider]  ELIQUIS 5 MG TABS tablet TAKE 1 TABLET BY MOUTH TWICE DAILY. 06/25/19   Lorretta Harp, MD  Flaxseed, Linseed, (FLAX SEED OIL PO) Take 1,400 mg by mouth daily.    [provider]  furosemide (LASIX) 40 MG tablet Take 1 tablet (40 mg total) by mouth daily. 03/18/18 05/15/19  Kroeger, Lorelee Cover., PA-C  lisinopril (PRINIVIL,ZESTRIL) 20 MG tablet Take 1 tablet (20 mg total) by mouth daily. HOLD UNTIL YOUR APPT WITH DR Glynda Jaeger 02/11/18   Erlene Quan, PA-C  magnesium oxide (MAG-OX) 400 MG tablet Take 400 mg by mouth daily.    [provider]  Omega-3 Fatty Acids (FISH OIL) 1200 MG CAPS Take 1,000 mg by mouth daily.     [provider]  potassium chloride 20 MEQ TBCR Take 20 mEq by mouth daily. 03/18/18 05/15/19  Kroeger, Lorelee Cover., PA-C  pyridoxine (B-6) 200 MG tablet Take 200 mg by mouth daily.    [provider]  vitamin C (ASCORBIC ACID) 500 MG tablet Take 1,000 mg by mouth 2 (two) times daily.    [provider]    Allergies    Patient has no known allergies.  Review of Systems   Review of  Systems  Constitutional: Negative for fever.  Skin: Positive for wound.  Neurological: Negative for numbness.    Physical Exam Updated Vital Signs BP (!) 171/75   Pulse (!) 55   Temp 98.7 F (37.1 C) (Oral)   Resp 16   Ht 6\' 3"  (1.905 m)   Wt 100.7 kg   SpO2 97%   BMI 27.75 kg/m   Physical Exam Vitals and nursing note reviewed.  Constitutional:  General: He is not in acute distress.    Appearance: He is well-developed.  HENT:     Head: Atraumatic.  Eyes:     Conjunctiva/sclera: Conjunctivae normal.  Musculoskeletal:        General: Signs of injury (RLE: 10 cm deep laceration noted to the anterior mid tib-fib vertically with normal dorsiflexion and plantarflexion of foot and ankle.  No foreign body noted.  Area tender to palpation) present.     Cervical back: Neck supple.  Skin:    Findings: No rash.  Neurological:     Mental Status: He is alert and oriented to person, place, and time.  Psychiatric:        Mood and Affect: Mood normal.     ED Results / Procedures / Treatments   Labs (all labs ordered are listed, but only abnormal results are displayed) Labs Reviewed - No data to display  EKG None  Radiology DG Tibia/Fibula Right  Result Date: 08/12/2019 CLINICAL DATA:  RIGHT lower leg laceration by a metal bracket in the garden, on blood thinners EXAM: RIGHT TIBIA AND FIBULA - 2 VIEW COMPARISON:  None FINDINGS: Osseous mineralization low normal. Knee and ankle joint alignments normal. No acute fracture, dislocation, or bone destruction. No radiopaque foreign bodies identified. Scattered atherosclerotic calcifications popliteal artery and runoff vessels. IMPRESSION: No acute osseous abnormalities or radiopaque foreign body. Electronically Signed   By: Lavonia Dana M.D.   On: 08/12/2019 12:26    Procedures .Marland KitchenLaceration Repair  Date/Time: 08/12/2019 1:17 PM Performed by: Domenic Moras, PA-C Authorized by: Domenic Moras, PA-C   Consent:    Consent obtained:   Verbal   Consent given by:  Patient   Risks discussed:  Infection, need for additional repair, pain, poor cosmetic result and poor wound healing   Alternatives discussed:  No treatment and delayed treatment Universal protocol:    Procedure explained and questions answered to patient or proxy's satisfaction: yes     Relevant documents present and verified: yes     Test results available and properly labeled: yes     Imaging studies available: yes     Required blood products, implants, devices, and special equipment available: yes     Site/side marked: yes     Immediately prior to procedure, a time out was called: yes     Patient identity confirmed:  Verbally with patient Anesthesia (see MAR for exact dosages):    Anesthesia method:  Local infiltration   Local anesthetic:  Lidocaine 2% WITH epi Laceration details:    Location:  Leg   Leg location:  R lower leg   Length (cm):  10   Depth (mm):  13 Repair type:    Repair type:  Complex Pre-procedure details:    Preparation:  Patient was prepped and draped in usual sterile fashion and imaging obtained to evaluate for foreign bodies Exploration:    Limited defect created (wound extended): yes     Hemostasis achieved with:  Direct pressure and epinephrine   Wound exploration: wound explored through full range of motion and entire depth of wound probed and visualized     Wound extent: muscle damage and vascular damage     Wound extent: no nerve damage noted, no tendon damage noted and no underlying fracture noted     Contaminated: no   Treatment:    Area cleansed with:  Betadine and saline   Amount of cleaning:  Standard   Irrigation solution:  Sterile saline   Irrigation method:  Pressure wash   Visualized foreign bodies/material removed: no     Debridement:  Moderate   Undermining:  Minimal   Scar revision: no   Skin repair:    Repair method:  Sutures and staples   Suture size:  4-0   Suture material:  Prolene   Suture  technique:  Horizontal mattress   Number of sutures:  3   Number of staples:  15 Approximation:    Approximation:  Close Post-procedure details:    Dressing:  Sterile dressing and adhesive bandage   Patient tolerance of procedure:  Tolerated well, no immediate complications   (including critical care time)  Medications Ordered in ED Medications  Tdap (BOOSTRIX) injection 0.5 mL (0.5 mLs Intramuscular Given 08/12/19 1101)  lidocaine-EPINEPHrine (XYLOCAINE W/EPI) 2 %-1:100000 (with pres) injection 20 mL (20 mLs Intradermal Given 08/12/19 1322)    ED Course  I have reviewed the triage vital signs and the nursing notes.  Pertinent labs & imaging results that were available during my care of the patient were reviewed by me and considered in my medical decision making (see chart for details).    MDM Rules/Calculators/A&P                      BP (!) 171/75   Pulse (!) 55   Temp 98.7 F (37.1 C) (Oral)   Resp 16   Ht 6\' 3"  (1.905 m)   Wt 100.7 kg   SpO2 97%   BMI 27.75 kg/m   Final Clinical Impression(s) / ED Diagnoses Final diagnoses:  Leg laceration, right, initial encounter    Rx / DC Orders ED Discharge Orders    None     11:02 AM Pt suffered laceration to RLE when his leg caught against a sharp metal edge on his raised garden bed.  Will update tdap, and will obtain xray to r/o fb or fx.  Will perform wound repair.   1:19 PM Successful lac repair using sutures and staples.  Appropriate wound care instruction given. Xray neg.  tdap updated.  Recommend removal of staples and sutures in 7-10 days.  Care discussed with DR. Curatolo.    Domenic Moras, PA-C 08/12/19 [place], Adam, DO 08/12/19 1400

## 2019-08-12 NOTE — ED Notes (Signed)
Pts wound dressed with telfa pads and gauze wrap.

## 2019-08-12 NOTE — ED Provider Notes (Signed)
Medical screening examination/treatment/procedure(s) were conducted as a shared visit with non-physician practitioner(s) and myself.  I personally evaluated the patient during the encounter. Briefly, the patient is a 79 y.o. male with history of A. fib on Coumadin who presents to the ED with right lower leg laceration.  Patient with unremarkable vitals.  Accidentally cut his right lower leg on metal garden bed.  Laceration has been repaired by my physician associate.  No underlying fracture.  Neurovascularly neuromuscularly intact.  Did not fall or hit his head or injure his neck.  Given wound care instructions.  Discharged in ED in good condition.   EKG Interpretation None           Lennice Sites, DO 08/12/19 1324

## 2019-10-02 ENCOUNTER — Other Ambulatory Visit: Payer: Self-pay | Admitting: Cardiovascular Disease

## 2019-10-02 DIAGNOSIS — I4821 Permanent atrial fibrillation: Secondary | ICD-10-CM

## 2019-10-02 NOTE — Telephone Encounter (Signed)
Rx(s) sent to pharmacy electronically.  

## 2019-12-29 ENCOUNTER — Ambulatory Visit (HOSPITAL_COMMUNITY)
Admission: RE | Admit: 2019-12-29 | Discharge: 2019-12-29 | Disposition: A | Discharge status: Home / Self Care | Payer: Medicare Other | Source: Ambulatory Visit | Attending: Cardiovascular Disease | Admitting: Cardiovascular Disease

## 2019-12-29 ENCOUNTER — Other Ambulatory Visit: Payer: Self-pay

## 2019-12-29 ENCOUNTER — Other Ambulatory Visit (HOSPITAL_COMMUNITY): Payer: Self-pay | Admitting: Cardiovascular Disease

## 2019-12-29 DIAGNOSIS — I6521 Occlusion and stenosis of right carotid artery: Secondary | ICD-10-CM | POA: Insufficient documentation

## 2019-12-29 DIAGNOSIS — I6523 Occlusion and stenosis of bilateral carotid arteries: Secondary | ICD-10-CM

## 2020-01-01 ENCOUNTER — Other Ambulatory Visit: Payer: Self-pay

## 2020-01-01 DIAGNOSIS — I6521 Occlusion and stenosis of right carotid artery: Secondary | ICD-10-CM

## 2020-01-24 ENCOUNTER — Encounter (HOSPITAL_COMMUNITY): Payer: Self-pay

## 2020-01-24 ENCOUNTER — Other Ambulatory Visit: Payer: Self-pay

## 2020-01-24 ENCOUNTER — Emergency Department (HOSPITAL_COMMUNITY)
Admission: EM | Admit: 2020-01-24 | Discharge: 2020-01-25 | Disposition: A | Discharge status: Home / Self Care | Payer: Medicare Other | Attending: Emergency Medicine | Admitting: Emergency Medicine

## 2020-01-24 DIAGNOSIS — Z85038 Personal history of other malignant neoplasm of large intestine: Secondary | ICD-10-CM | POA: Diagnosis not present

## 2020-01-24 DIAGNOSIS — Z79899 Other long term (current) drug therapy: Secondary | ICD-10-CM | POA: Diagnosis not present

## 2020-01-24 DIAGNOSIS — Z7901 Long term (current) use of anticoagulants: Secondary | ICD-10-CM | POA: Diagnosis not present

## 2020-01-24 DIAGNOSIS — R7401 Elevation of levels of liver transaminase levels: Secondary | ICD-10-CM

## 2020-01-24 DIAGNOSIS — R519 Headache, unspecified: Secondary | ICD-10-CM | POA: Diagnosis not present

## 2020-01-24 DIAGNOSIS — Z7982 Long term (current) use of aspirin: Secondary | ICD-10-CM | POA: Diagnosis not present

## 2020-01-24 DIAGNOSIS — Z955 Presence of coronary angioplasty implant and graft: Secondary | ICD-10-CM | POA: Insufficient documentation

## 2020-01-24 DIAGNOSIS — I10 Essential (primary) hypertension: Secondary | ICD-10-CM | POA: Insufficient documentation

## 2020-01-24 DIAGNOSIS — R778 Other specified abnormalities of plasma proteins: Secondary | ICD-10-CM | POA: Insufficient documentation

## 2020-01-24 DIAGNOSIS — R030 Elevated blood-pressure reading, without diagnosis of hypertension: Secondary | ICD-10-CM | POA: Diagnosis present

## 2020-01-24 DIAGNOSIS — I2510 Atherosclerotic heart disease of native coronary artery without angina pectoris: Secondary | ICD-10-CM | POA: Insufficient documentation

## 2020-01-24 DIAGNOSIS — J449 Chronic obstructive pulmonary disease, unspecified: Secondary | ICD-10-CM | POA: Insufficient documentation

## 2020-01-24 DIAGNOSIS — Z87891 Personal history of nicotine dependence: Secondary | ICD-10-CM | POA: Insufficient documentation

## 2020-01-24 LAB — COMPREHENSIVE METABOLIC PANEL
ALT: 84 U/L — ABNORMAL HIGH (ref 0–44)
AST: 86 U/L — ABNORMAL HIGH (ref 15–41)
Albumin: 5 g/dL (ref 3.5–5.0)
Alkaline Phosphatase: 85 U/L (ref 38–126)
Anion gap: 12 (ref 5–15)
BUN: 20 mg/dL (ref 8–23)
CO2: 24 mmol/L (ref 22–32)
Calcium: 9.4 mg/dL (ref 8.9–10.3)
Chloride: 102 mmol/L (ref 98–111)
Creatinine, Ser: 0.67 mg/dL (ref 0.61–1.24)
GFR, Estimated: >60 mL/min (ref >60)
Glucose, Bld: 94 mg/dL (ref 70–99)
Potassium: 4.2 mmol/L (ref 3.5–5.1)
Sodium: 138 mmol/L (ref 135–145)
Total Bilirubin: 2.3 mg/dL — ABNORMAL HIGH (ref 0.3–1.2)
Total Protein: 7.6 g/dL (ref 6.5–8.1)

## 2020-01-24 LAB — CBC
HCT: 47.1 % (ref 39.0–52.0)
Hemoglobin: 15.6 g/dL (ref 13.0–17.0)
MCH: 32 pg (ref 26.0–34.0)
MCHC: 33.1 g/dL (ref 30.0–36.0)
MCV: 96.7 fL (ref 80.0–100.0)
Platelets: 155 10*3/uL (ref 150–400)
RBC: 4.87 MIL/uL (ref 4.22–5.81)
RDW: 14.4 % (ref 11.5–15.5)
WBC: 8.7 10*3/uL (ref 4.0–10.5)
nRBC: 0 % (ref 0.0–0.2)

## 2020-01-24 LAB — TROPONIN I (HIGH SENSITIVITY): Troponin I (High Sensitivity): 18 ng/L — ABNORMAL HIGH (ref <18)

## 2020-01-24 NOTE — ED Provider Notes (Signed)
[facility] DEPT Provider Note   CSN: [id number] Arrival date & time: 01/24/20  1950     History Chief Complaint  Patient presents with  . Hypertension    [name] is a 79 y.o. male with a history of A. fib on Eliquis, CAD, NSTEMI s/p post RCA stenting and CABG x4 on 2019, carotid stenosis, dyslipidemia, HTN, and COPD, colon CA s/p colectomy and chemotherapy in 2008 who presents to the emergency department today with a chief complaint of elevated blood pressure.  The patient reports that he was instructed by his PCP, Dr. [name], to regularly check his blood pressure 3 times a week.  Earlier today, he reports that he was routinely checking his blood pressure multiple times over the course of 30 minutes and noted his blood pressure to be 200-204/89-94.  No history of similar.  Reports that his blood pressure typically runs in the 130s over 80s.  His primary concern is that in 2019 he was diagnosed with an NSTEMI and his only presenting symptom was "feeling funny".  He has concerned that with his blood pressure being high that something else could be going on or he could be having another heart attack and not realizing it due to having minimal symptoms previously.  He reports that earlier today he briefly had a "twinge" of a headache located at the top of his head that resolved within a minute and tingling in his bilateral hands that lasted for about a minute.  However, he states that he never has headaches.  He has not had a recurrence since the episode since earlier today.  He denies dizziness, lightheadedness, difficulty walking, numbness, weakness, visual changes, amaurosis fugax, chest pain, shortness of breath, cough, slurred speech, facial droop, back pain, abdominal pain, nausea, vomiting, diarrhea, dysuria, or difficulty urinating.  He is also on Eliquis for chronic atrial fibrillation.  He does note that he missed 1 dose in the last week, but is  otherwise been compliant with all of his home medications.  States that he is not on any antihypertensive medications.  He does take multiple dietary supplements including Coutu enzyme 10, flaxseed, fish oil, melatonin, B12, B6, D3, zinc, and magnesium.   He did have his COVID-19 booster immunization last week.  No associated symptoms that he has been feeling at his baseline.  He has not been taking over-the-counter cough or cold medicine.  No recent antibiotics and he has not had any recent changes to his home medications.  He had his physical with Dr. [name] last week, which was unremarkable.  He is followed by Dr. Gwenlyn Found with cardiology.   He does report increased stress earlier today after his daughter came to his home and informed him that she was going to be getting a divorce.  He reports that he was completely surprised by the news and was very upset after the announcement.  No other recent stressors or changes.  The history is provided by the patient and medical records. No language interpreter was used.       Past Medical History:  Diagnosis Date  . A-fib ([place])   . Allergy    Seasonal--pollen  . Anticoagulation goal of INR 2 to 3    on coumadin  . Arrhythmia   . CAD (coronary artery disease) 1999   RCA stent  . Carotid stenosis, asymptomatic    moderate RT and mild LT with carotid dopplers 10/26/11  . Colon cancer ([place]) 04/2006   Stage III--T3  N1  . COPD (chronic obstructive pulmonary disease) ([place])   . H/O cardiovascular stress test 01/13/2010   low risk scan, similar to 2008 study  . H/O echocardiogram 05/23/2009   EF >55%, mild MR, Mild TR,   . Hand foot syndrome   . Hemorrhoids   . Hyperlipidemia   . Hypertension   . Neuropathy   . Permanent atrial fibrillation ([place])   . Psoriasis   . Tubular adenoma of colon     Patient Active Problem List   Diagnosis Date Noted  . Edema of leg 02/26/2018  . 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 NSTEMI (non-ST elevated myocardial  infarction) ([place]) 01/21/2018  . History of colon cancer 08/19/2013  . Essential hypertension 11/20/2012  . Dyslipidemia, goal LDL below 70 11/20/2012  . Chronic anticoagulation   . Carotid stenosis, asymptomatic   . CAD S/P percutaneous coronary angioplasty   . Atrial fibrillation ([place]) 06/02/2012    Past Surgical History:  Procedure Laterality Date  . CARDIAC CATHETERIZATION    . CLIPPING OF ATRIAL APPENDAGE  01/28/2018   Procedure: CLIPPING OF ATRIAL APPENDAGE;  Surgeon: Gaye Pollack, MD;  Location: [facility] OR;  Service: Open Heart Surgery;;  . COLONOSCOPY    . CORONARY ARTERY BYPASS GRAFT N/A 01/28/2018   Procedure: CORONARY ARTERY BYPASS GRAFTING (CABG) X 4 USING LEFT INTERNAL MAMMARY ARTERY AND LEFT GREATER SAPHENOUS VEIN HARVESTED ENDOSCOPICALLY;  Surgeon: Gaye Pollack, MD;  Location: [facility];  Service: Open Heart Surgery;  Laterality: N/A;  . CORONARY STENT PLACEMENT  1999   RCA tandem NIR stents  . LEFT HEART CATH AND CORONARY ANGIOGRAPHY N/A 01/23/2018   Procedure: LEFT HEART CATH AND CORONARY ANGIOGRAPHY;  Surgeon: Nelva Bush, MD;  Location: [facility];  Service: Cardiovascular;  Laterality: N/A;  . RIGHT COLECTOMY  05/29/2006   Laparoscopic assisted  . TEE WITHOUT CARDIOVERSION N/A 01/28/2018   Procedure: TRANSESOPHAGEAL ECHOCARDIOGRAM (TEE);  Surgeon: Gaye Pollack, MD;  Location: [facility];  Service: Open Heart Surgery;  Laterality: N/A;  . TONSILLECTOMY         Family History  Problem Relation Age of Onset  . Heart disease Father 56  . Heart disease Paternal Grandmother   . Heart disease Mother   . Hypertension Mother   . Healthy Sister   . Heart disease Paternal Grandfather   . Hypertension Paternal Grandfather   . Colon cancer Neg Hx     Social History   Tobacco Use  . Smoking status: Former Smoker    Years: 30.00    Quit date: 11/25/1993    Years since quitting: 26.1  . Smokeless tobacco: Never Used  Vaping Use  . Vaping Use: Never used    Substance Use Topics  . Alcohol use: Yes    Alcohol/week: 1.0 standard drink    Types: 1 Cans of beer per week  . Drug use: No    Home Medications Prior to Admission medications   Medication Sig Start Date End Date Taking? Authorizing Provider  Alpha Lipoic Acid 200 MG CAPS Take 600 mg by mouth daily.    Yes [provider]  aspirin 81 MG tablet Take 81 mg by mouth daily.   Yes [provider]  atorvastatin (LIPITOR) 80 MG tablet Take 1 tablet (80 mg total) by mouth daily. 10/02/19  Yes Lorretta Harp, MD  cholecalciferol (VITAMIN D) 1000 UNITS tablet Take 1,000 Units by mouth daily.   Yes [provider]  Coenzyme Q10 200  MG capsule Take 200 mg by mouth daily.    Yes [provider]  Cyanocobalamin (VITAMIN B 12 PO) Take 100 mg by mouth daily.    Yes [provider]  ELIQUIS 5 MG TABS tablet TAKE 1 TABLET BY MOUTH TWICE DAILY. 10/02/19  Yes Lorretta Harp, MD  Flaxseed, Linseed, (FLAX SEED OIL PO) Take 1,400 mg by mouth daily.   Yes [provider]  magnesium oxide (MAG-OX) 400 MG tablet Take 400 mg by mouth daily.   Yes [provider]  Omega-3 Fatty Acids (FISH OIL) 1200 MG CAPS Take 1,000 mg by mouth daily.    Yes [provider]  pyridoxine (B-6) 200 MG tablet Take 200 mg by mouth daily.   Yes [provider]  vitamin C (ASCORBIC ACID) 500 MG tablet Take 500 mg by mouth 3 (three) times daily.    Yes [provider]  amLODipine (NORVASC) 5 MG tablet Take 1 tablet (5 mg total) by mouth daily for 14 days. 01/25/20 02/08/20  Hether Anselmo A, PA-C  potassium chloride 20 MEQ TBCR Take 20 mEq by mouth daily. 03/18/18 05/15/19  Kroeger, Lorelee Cover., PA-C  furosemide (LASIX) 40 MG tablet Take 1 tablet (40 mg total) by mouth daily. 03/18/18 01/25/20  Kroeger, Lorelee Cover., PA-C  lisinopril (PRINIVIL,ZESTRIL) 20 MG tablet Take 1 tablet (20 mg total) by mouth daily. HOLD UNTIL YOUR APPT WITH DR Glynda Jaeger Patient  not taking: Reported on 01/25/2020 02/11/18 01/25/20  Erlene Quan, PA-C    Allergies    Patient has no known allergies.  Review of Systems   Review of Systems  Constitutional: Negative for appetite change, chills, fatigue and fever.  HENT: Negative for congestion and sore throat.   Eyes: Negative for visual disturbance.  Respiratory: Negative for cough, shortness of breath and wheezing.   Cardiovascular: Negative for chest pain and palpitations.  Gastrointestinal: Negative for abdominal pain, blood in stool, constipation, diarrhea, nausea and vomiting.  Genitourinary: Negative for difficulty urinating, dysuria and urgency.  Musculoskeletal: Negative for arthralgias, back pain, gait problem, myalgias, neck pain and neck stiffness.  Skin: Negative for rash and wound.  Allergic/Immunologic: Negative for immunocompromised state.  Neurological: Positive for headaches. Negative for dizziness, seizures, syncope, facial asymmetry, speech difficulty, weakness, light-headedness and numbness.       Paresthesias  Psychiatric/Behavioral: Negative for agitation and confusion. The patient is nervous/anxious.     Physical Exam Updated Vital Signs BP (!) 141/61   Pulse (!) 48   Temp 98.4 F (36.9 C)   Resp 20   Ht 6\' 3"  (1.905 m)   Wt 99.8 kg   SpO2 93%   BMI 27.50 kg/m   Physical Exam Vitals and nursing note reviewed.  Constitutional:      General: He is not in acute distress.    Appearance: He is well-developed. He is not ill-appearing, toxic-appearing or diaphoretic.     Comments: Well-appearing, elderly male.  No acute distress.  HENT:     Head: Normocephalic.  Eyes:     Extraocular Movements: Extraocular movements intact.     Conjunctiva/sclera: Conjunctivae normal.     Pupils: Pupils are equal, round, and reactive to light.  Neck:     Vascular: No carotid bruit.  Cardiovascular:     Rate and Rhythm: Normal rate. Rhythm irregularly irregular.     Pulses: Normal pulses.      Heart sounds: Normal heart sounds. No murmur heard.  No friction rub. No gallop.   Pulmonary:  Effort: Pulmonary effort is normal. No respiratory distress.     Breath sounds: No stridor. No wheezing, rhonchi or rales.  Chest:     Chest wall: No tenderness.  Abdominal:     General: There is no distension.     Palpations: Abdomen is soft. There is no mass.     Tenderness: There is no abdominal tenderness. There is no right CVA tenderness, left CVA tenderness, guarding or rebound.     Hernia: No hernia is present.     Comments: Abdomen is soft, nontender, nondistended.  Normoactive bowel sounds.  Negative Murphy sign.  No CVA tenderness bilaterally.  No peritoneal signs.  Musculoskeletal:     Cervical back: Neck supple. No rigidity or tenderness.     Right lower leg: No edema.     Left lower leg: No edema.     Comments: Spine is nontender.  Lymphadenopathy:     Cervical: No cervical adenopathy.  Skin:    General: Skin is warm and dry.     Capillary Refill: Capillary refill takes less than 2 seconds.     Coloration: Skin is not jaundiced or pale.     Findings: No erythema or rash.  Neurological:     Mental Status: He is alert.     Comments: Alert and oriented x3.  GCS 15.  Cranial nerves II through XII are grossly intact.  Sensation is intact and equal throughout.  5-5 strength against resistance of the bilateral upper and lower extremities.  Finger-to-nose is intact bilaterally.  Negative Romberg.  No pronator drift.  Gait is steady and without ataxia.  Psychiatric:        Mood and Affect: Mood is anxious.        Behavior: Behavior normal.     ED Results / Procedures / Treatments   Labs (all labs ordered are listed, but only abnormal results are displayed) Labs Reviewed  COMPREHENSIVE METABOLIC PANEL - Abnormal; Notable for the following components:      Result Value   AST 86 (*)    ALT 84 (*)    Total Bilirubin 2.3 (*)    All other components within normal limits    TROPONIN I (HIGH SENSITIVITY) - Abnormal; Notable for the following components:   Troponin I (High Sensitivity) 18 (*)    All other components within normal limits  TROPONIN I (HIGH SENSITIVITY) - Abnormal; Notable for the following components:   Troponin I (High Sensitivity) 21 (*)    All other components within normal limits  TROPONIN I (HIGH SENSITIVITY) - Abnormal; Notable for the following components:   Troponin I (High Sensitivity) 19 (*)    All other components within normal limits  CBC    EKG EKG Interpretation  Date/Time:  Sunday January 24 2020 20:15:33 EST Ventricular Rate:  69 PR Interval:    QRS Duration: 133 QT Interval:  427 QTC Calculation: 458 R Axis:   -59 Text Interpretation: Atrial fibrillation RBBB and LAFB Abnormal T, consider ischemia, lateral leads 12 Lead; Mason-Likar since last tracing no significant change Confirmed by Malvin Johns [phone]) on 01/24/2020 10:51:27 PM   Radiology CT Head Wo Contrast  Result Date: 01/25/2020 CLINICAL DATA:  Dizziness, elevated blood pressure EXAM: CT HEAD WITHOUT CONTRAST TECHNIQUE: Contiguous axial images were obtained from the base of the skull through the vertex without intravenous contrast. COMPARISON:  CT 05/07/2010 FINDINGS: Brain: No evidence of acute infarction, hemorrhage, hydrocephalus, extra-axial collection, visible mass lesion or mass effect. Symmetric prominence of the ventricles,  cisterns and sulci compatible with parenchymal volume loss. Patchy areas of white matter hypoattenuation are most compatible with chronic microvascular angiopathy. Vascular: Atherosclerotic calcification of the carotid siphons. No hyperdense vessel. Skull: Focal skin thickening of the left frontal scalp, could correlate for site of contusion or scarring. No other significant scalp swelling or hematoma. No calvarial fracture or other acute or worrisome osseous lesion. Sinuses/Orbits: Paranasal sinuses and mastoid air cells are  predominantly clear. Orbital structures are unremarkable aside from prior lens extractions. Other: None IMPRESSION: 1. No acute intracranial abnormality. 2. Chronic microvascular angiopathy and parenchymal volume loss. 3. Focal skin thickening of the left frontal scalp, could correlate for site of contusion or scarring. Electronically Signed   By: Lovena Le M.D.   On: 01/25/2020 01:12    Procedures .Critical Care Performed by: Joanne Gavel, PA-C Authorized by: Joanne Gavel, PA-C   Critical care provider statement:    Critical care time was exclusive of:  Separately billable procedures and treating other patients and teaching time   Critical care was necessary to treat or prevent imminent or life-threatening deterioration of the following conditions:  Cardiac failure   Critical care was time spent personally by me on the following activities:  Ordering and performing treatments and interventions, ordering and review of laboratory studies, ordering and review of radiographic studies, pulse oximetry, re-evaluation of patient's condition, review of old charts, obtaining history from patient or surrogate, examination of patient, evaluation of patient's response to treatment, discussions with consultants and development of treatment plan with patient or surrogate   I assumed direction of critical care for this patient from another provider in my specialty: no     (including critical care time)  Medications Ordered in ED Medications  amLODipine (NORVASC) tablet 5 mg (5 mg Oral Given 01/25/20 0232)    ED Course  I have reviewed the triage vital signs and the nursing notes.  Pertinent labs & imaging results that were available during my care of the patient were reviewed by me and considered in my medical decision making (see chart for details).  Clinical Course as of Jan 24 354  Mon Jan 25, 2020  0206 Spoke with Dr. Jonne Ply, cardiology, regarding the patient's symptoms.  Since patient  underwent revascularization in 2019, he may have minimally elevated troponin levels from this procedure.  Patient has notably been compliant with regimen since CABG.  Troponin could also be minimally elevated secondary to his elevated blood pressure today.  He recommends treating his blood pressure and then rechecking troponin for trend.  Will order low-dose of amlodipine in order third troponin.   [MM]    Clinical Course User Index [MM] Perrin Smack   MDM Rules/Calculators/A&P                          79 year old male with a history of A. fib on Eliquis, CAD, NSTEMI s/p post RCA stenting and CABG x4 on 2019, carotid stenosis, dyslipidemia, HTN, and COPD, colon CA s/p colectomy and chemotherapy in 2008 presenting with hypertension, onset this afternoon.  He reported that he had a headache and paresthesias in his bilateral hands that came on suddenly and resolved and under a minute earlier today.  No other associated symptoms.  The patient was discussed with Dr. Florina Ou, attending physician.   Blood pressure is 192/91.  He is afebrile.  No tachypnea or hypoxia.  EKG unchanged from previous.   Labs have been reviewed  and independently interpreted by me.  -AST 86, ALT 84, total bilirubin 2.3.  Patient does report a history of previously elevated transaminases several years ago that improved.  He has no abdominal tenderness on exam.  Doubt cholecystitis.  He has no jaundice.  He can follow-up with primary care. - Troponin 18->21->19.  Trend is stable in the ER.   Imaging has been reviewed and independently interpreted by me.  Head CT is unremarkable.  Regarding the patient's elevated troponin, cardiology was consulted who recommended trending with a third troponin and managing the patient's blood pressure.  Patient's blood pressure after amlodipine was 140 over 70s.  Troponin trend is flat.  I discussed labs at length with the patient.  I have advised him to follow-up with his PCP for  recheck of his blood pressure this week.  We will discharge the patient with a 2-week course of amlodipine to take daily.  He is agreeable with this plan.  Today, I have a low suspicion for ACS, hypertensive urgency or emergency, CVA, intracranial hemorrhage, aortic dissection, PE.  I suspect is minimally elevated blood pressure is due to revascularization in 2019 after discussing with cardiology.  Patient has been observed for more than 8 hours and is remained asymptomatic in the emergency department.  At this time, the patient is hemodynamically stable to no acute distress.  Safe for discharge to home with outpatient follow-up as indicated.  Final Clinical Impression(s) / ED Diagnoses Final diagnoses:  Elevated blood pressure reading  Elevated troponin  Elevated transaminase level    Rx / DC Orders ED Discharge Orders         Ordered    amLODipine (NORVASC) 5 MG tablet  Daily        01/25/20 0346           Joline Maxcy A, PA-C 01/25/20 0355    Molpus, Jenny Reichmann, MD 01/25/20 1308

## 2020-01-24 NOTE — ED Triage Notes (Signed)
Pt reports stressful events in the family. Pt denies hx of htn.

## 2020-01-25 ENCOUNTER — Emergency Department (HOSPITAL_COMMUNITY): Payer: Medicare Other

## 2020-01-25 LAB — TROPONIN I (HIGH SENSITIVITY)
Troponin I (High Sensitivity): 21 ng/L — ABNORMAL HIGH (ref <18)
Troponin I (High Sensitivity): 19 ng/L — ABNORMAL HIGH (ref <18)

## 2020-01-25 MED ORDER — AMLODIPINE BESYLATE 5 MG PO TABS
5.0000 mg | ORAL_TABLET | Freq: Once | ORAL | Status: AC
  Administered 2020-01-25: 5 mg via ORAL
  Filled 2020-01-25: qty 1

## 2020-01-25 MED ORDER — AMLODIPINE BESYLATE 5 MG PO TABS: 5.0000 mg | ORAL_TABLET | Freq: Every day | ORAL | 0 refills | Status: DC

## 2020-01-25 NOTE — Discharge Instructions (Signed)
Thank you for allowing me to care for you today in the Emergency Department.  Your blood pressure was elevated in the ER today.  It significantly improved during your visit.  You were given a low dose of a medication called amlodipine.  I have called a 2-week prescription of this medication into your pharmacy.  You were given a dose early this morning, so do not take the next dose for approximately 24 hours.  Then, take 1 tablet by mouth daily.  Please call Dr. Delene Ruffini office to schedule a follow-up appointment regarding your ER visit today.  Please make sure they recheck your blood pressure.  Continue to take your other medications as prescribed.  Return to the emergency department if you develop dizziness, lightheadedness, if you pass out, if you develop chest pain that is worse with activity or exertion, chest pain with sweating, new numbness or weakness, or other new, concerning symptoms.

## 2020-02-05 ENCOUNTER — Other Ambulatory Visit: Payer: Self-pay | Admitting: Cardiovascular Disease

## 2020-02-05 DIAGNOSIS — I4821 Permanent atrial fibrillation: Secondary | ICD-10-CM

## 2020-04-29 ENCOUNTER — Encounter: Payer: Self-pay | Admitting: Gastroenterology

## 2020-05-29 ENCOUNTER — Other Ambulatory Visit: Payer: Self-pay | Admitting: Cardiovascular Disease

## 2020-05-30 ENCOUNTER — Telehealth: Payer: Self-pay | Admitting: Cardiovascular Disease

## 2020-05-30 NOTE — Telephone Encounter (Signed)
STAT if HR is under 50 or over 120 (normal HR is 60-100 beats per minute)  1) What is your heart rate? 78-60  2) Do you have a log of your heart rate readings (document readings)? Resting heart rate 58-43; walking around 74-78  3) Do you have any other symptoms? No other symptoms  Patient is concern of his resting heart rate he said never been that low before. He would like to be seen but first available is 06/28/20 with app. Patient states he needs to be seen sooner.

## 2020-05-30 NOTE — Telephone Encounter (Signed)
Spoke with pt who state since yesterday he's noticed his resting HR averages between 43-55. Pt state he is concerned because this is new for him. Pt is asymptomatic but wanted to be evaluated.  Appointment scheduled for 3/30 with Dr. Gwenlyn Found.

## 2020-06-02 DIAGNOSIS — I872 Venous insufficiency (chronic) (peripheral): Secondary | ICD-10-CM | POA: Diagnosis not present

## 2020-06-02 DIAGNOSIS — I482 Chronic atrial fibrillation, unspecified: Secondary | ICD-10-CM | POA: Diagnosis not present

## 2020-06-02 DIAGNOSIS — I10 Essential (primary) hypertension: Secondary | ICD-10-CM | POA: Diagnosis not present

## 2020-06-02 DIAGNOSIS — Z85038 Personal history of other malignant neoplasm of large intestine: Secondary | ICD-10-CM | POA: Diagnosis not present

## 2020-06-06 ENCOUNTER — Other Ambulatory Visit: Payer: Self-pay

## 2020-06-06 DIAGNOSIS — I4821 Permanent atrial fibrillation: Secondary | ICD-10-CM

## 2020-06-06 MED ORDER — ATORVASTATIN CALCIUM 80 MG PO TABS: 80.0000 mg | ORAL_TABLET | Freq: Every day | ORAL | 3 refills | Status: DC

## 2020-06-06 MED ORDER — APIXABAN 5 MG PO TABS: 5.0000 mg | ORAL_TABLET | Freq: Two times a day (BID) | ORAL | 1 refills | Status: DC

## 2020-06-06 NOTE — Telephone Encounter (Signed)
Received a fax refill request for eliquis, temisartan, nd atorvastatin to be sent to [facility]. The eliquis has been refilled by [id number], 108.6kg, scr 0.67 01/24/20, lovw/berry 05/15/19.

## 2020-06-08 NOTE — Telephone Encounter (Signed)
This encounter was created in error - please disregard.

## 2020-06-15 ENCOUNTER — Other Ambulatory Visit: Payer: Self-pay

## 2020-06-15 ENCOUNTER — Encounter: Payer: Self-pay | Admitting: Cardiovascular Disease

## 2020-06-15 ENCOUNTER — Ambulatory Visit: Payer: Medicare Other | Admitting: Cardiovascular Disease

## 2020-06-15 DIAGNOSIS — I4811 Longstanding persistent atrial fibrillation: Secondary | ICD-10-CM | POA: Diagnosis not present

## 2020-06-15 DIAGNOSIS — I10 Essential (primary) hypertension: Secondary | ICD-10-CM

## 2020-06-15 DIAGNOSIS — Z951 Presence of aortocoronary bypass graft: Secondary | ICD-10-CM | POA: Diagnosis not present

## 2020-06-15 DIAGNOSIS — I6521 Occlusion and stenosis of right carotid artery: Secondary | ICD-10-CM

## 2020-06-15 DIAGNOSIS — E785 Hyperlipidemia, unspecified: Secondary | ICD-10-CM

## 2020-06-15 NOTE — Patient Instructions (Signed)
Medication Instructions:  Your physician recommends that you continue on your current medications as directed. Please refer to the Current Medication list given to you today.  *If you need a refill on your cardiac medications before your next appointment, please call your pharmacy*   Testing/Procedures: Your physician has requested that you have a carotid duplex. This test is an ultrasound of the carotid arteries in your neck. It looks at blood flow through these arteries that supply the brain with blood. Allow one hour for this exam. There are no restrictions or special instructions. This procedure is done at [facility]. 2nd Floor. To be done in Oct. 2022   Follow-Up: At [facility], you and your health needs are our priority.  As part of our continuing mission to provide you with exceptional heart care, we have created designated Provider Care Teams.  These Care Teams include your primary Cardiologist (physician) and Advanced Practice Providers (APPs -  Physician Assistants and Nurse Practitioners) who all work together to provide you with the care you need, when you need it.  We recommend signing up for the patient portal called "MyChart".  Sign up information is provided on this After Visit Summary.  MyChart is used to connect with patients for Virtual Visits (Telemedicine).  Patients are able to view lab/test results, encounter notes, upcoming appointments, etc.  Non-urgent messages can be sent to your provider as well.   To learn more about what you can do with MyChart, go to [email or web address].    Your next appointment:   12 month(s)  The format for your next appointment:   In Person  Provider:   [name], MD

## 2020-06-15 NOTE — Assessment & Plan Note (Signed)
History of essential hypertension with blood pressure measured today at 140/54.  He is on amlodipine and telmisartan.

## 2020-06-15 NOTE — Progress Notes (Signed)
06/15/2020 [name]   04/28/1940  [id number]  Primary Physician Lavone Orn, MD Primary Cardiologist: Lorretta Harp MD FACP, [place], [place], [place]  HPI:  [name] is a 80 y.o.  mildly overweight, married Caucasian male father of 2, grandfather of 3 grandchildren who I saw  saw in the office 05/15/2019.[name] He has a history of CAD status post RCA stenting by myself January 11, 1998. He had normal circumflex, LAD and ejection fraction. His other problems include hypertension and chronic atrial fibrillation on Coumadin anticoagulation, rate controlled, as well as erectile dysfunction. He is totally asymptomatic. He did have colon cancer picked up on screening colonoscopy and underwent right colectomy May 29, 2006 followed by 11 cycles of adjuvant Folfox chemotherapy. [name] He is very active exercises 6 days a week doing weight training 3 days a week, and lower extremity training 3 times a week.[name]  He was admitted to the hospital on 01/21/2018 with non-STEMI. He underwent cardiac catheterization 2 days later by Dr. Lendon Collar left main disease but disease in his LAD and RCA as well. He underwent CABG by Dr. Cyndia Bent on 01/28/2018 with a LIMA to his LAD, vein to an obtuse marginal branch and to the PDA and PLA sequentially. He also had left atrial clipping at that time.   He ended up completing cardiac rehab as an outpatient.  Since I saw him a year ago he has done well.  He does go to the gym 5 days a week and does treadmill and resistance training.  He denies chest pain or shortness of breath.   Current Meds  Medication Sig  . Alpha Lipoic Acid 200 MG CAPS Take 600 mg by mouth daily.   [name] amLODipine (NORVASC) 5 MG tablet Take 1 tablet (5 mg total) by mouth daily for 14 days.  [name] apixaban (ELIQUIS) 5 MG TABS tablet Take 1 tablet (5 mg total) by mouth 2 (two) times daily.  [name] aspirin 81 MG tablet Take 81 mg by mouth daily.  [name] atorvastatin (LIPITOR) 80 MG tablet Take 1 tablet (80  mg total) by mouth daily. Patient needs appointment for any future refills. Please call [phone] to schedule appointment.  2nd attempt.  . cholecalciferol (VITAMIN D) 1000 UNITS tablet Take 1,000 Units by mouth daily.  . Coenzyme Q10 200 MG capsule Take 200 mg by mouth daily.   . Cyanocobalamin (VITAMIN B 12 PO) Take 100 mg by mouth daily.   . Flaxseed, Linseed, (FLAX SEED OIL PO) Take 1,400 mg by mouth daily.  . magnesium oxide (MAG-OX) 400 MG tablet Take 400 mg by mouth daily.  . Omega-3 Fatty Acids (FISH OIL) 1200 MG CAPS Take 1,000 mg by mouth daily.  [name] pyridoxine (B-6) 200 MG tablet Take 200 mg by mouth daily.  . vitamin C (ASCORBIC ACID) 500 MG tablet Take 500 mg by mouth 3 (three) times daily.      No Known Allergies  Social History   Socioeconomic History  . Marital status: Married    Spouse name: [name]  . Number of children: 2  . Years of education: Not on file  . Highest education level: Bachelor's degree (e.g., BA, AB, BS)  Occupational History  . Occupation: retired    Fish farm manager: RETIRED  Tobacco Use  . Smoking status: Former Smoker    Years: 30.00    Quit date: 11/25/1993    Years since quitting: 26.5  . Smokeless tobacco: Never Used  Vaping Use  . Vaping Use:  Never used  Substance and Sexual Activity  . Alcohol use: Yes    Alcohol/week: 1.0 standard drink    Types: 1 Cans of beer per week  . Drug use: No  . Sexual activity: Not on file  Other Topics Concern  . Not on file  Social History Narrative  . Not on file   Social Determinants of Health   Financial Resource Strain: Not on file  Food Insecurity: Not on file  Transportation Needs: Not on file  Physical Activity: Not on file  Stress: Not on file  Social Connections: Not on file  Intimate Partner Violence: Not on file     Review of Systems: General: negative for chills, fever, night sweats or weight changes.  Cardiovascular: negative for chest pain, dyspnea on exertion, edema, orthopnea,  palpitations, paroxysmal nocturnal dyspnea or shortness of breath Dermatological: negative for rash Respiratory: negative for cough or wheezing Urologic: negative for hematuria Abdominal: negative for nausea, vomiting, diarrhea, bright red blood per rectum, melena, or hematemesis Neurologic: negative for visual changes, syncope, or dizziness All other systems reviewed and are otherwise negative except as noted above.    Blood pressure (!) 140/54, pulse (!) 45, height 6\' 3"  (1.905 m), weight 230 lb (104.3 kg).  General appearance: alert and no distress Neck: no adenopathy, no carotid bruit, no JVD, supple, symmetrical, trachea midline and thyroid not enlarged, symmetric, no tenderness/mass/nodules Lungs: clear to auscultation bilaterally Heart: irregularly irregular rhythm Extremities: extremities normal, atraumatic, no cyanosis or edema Pulses: 2+ and symmetric Skin: Skin color, texture, turgor normal. No rashes or lesions Neurologic: Alert and oriented X 3, normal strength and tone. Normal symmetric reflexes. 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  I personally reviewed this EKG.  ASSESSMENT AND PLAN:   Essential hypertension History of essential hypertension with blood pressure measured today at 140/54.  He is on amlodipine and telmisartan.  Atrial fibrillation (HCC) History of chronic A. fib with a slow ventricular response on Eliquis oral anticoagulation.  He is not symptomatic with this.  Carotid stenosis, asymptomatic History of carotid artery disease with moderate right ICA stenosis by duplex ultrasound 12/29/2019.  This will be repeated on annual basis.  S/P CABG x 4 History of CAD status post RCA stenting by myself 01/11/1998.  He had no other CAD at that time and had normal LV function.  He was admitted for non-STEMI 01/21/2018 and underwent  cardiac catheterization by Dr. Saunders Revel 2 days later revealing left main disease.  He underwent CABG by Dr. Cyndia Bent 01/28/2018 with a LIMA to the LAD and a vein to an obtuse marginal branch, PDA and PLA sequentially.  He did complete cardiac rehab.  He is totally asymptomatic.  Dyslipidemia, goal LDL below 70 History of dyslipidemia on statin therapy with lipid profile performed 12/21/2019 revealing total cholesterol 127, LDL 57 HDL of 53.      Lorretta Harp MD FACP,FACC,FAHA, [facility] 06/15/2020 4:52 PM

## 2020-06-15 NOTE — Assessment & Plan Note (Signed)
History of dyslipidemia on statin therapy with lipid profile performed 12/21/2019 revealing total cholesterol 127, LDL 57 HDL of 53.

## 2020-06-15 NOTE — Assessment & Plan Note (Signed)
History of chronic A. fib with a slow ventricular response on Eliquis oral anticoagulation.  He is not symptomatic with this.

## 2020-06-15 NOTE — Assessment & Plan Note (Signed)
History of CAD status post RCA stenting by myself 01/11/1998.  He had no other CAD at that time and had normal LV function.  He was admitted for non-STEMI 01/21/2018 and underwent cardiac catheterization by Dr. Saunders Revel 2 days later revealing left main disease.  He underwent CABG by Dr. Cyndia Bent 01/28/2018 with a LIMA to the LAD and a vein to an obtuse marginal branch, PDA and PLA sequentially.  He did complete cardiac rehab.  He is totally asymptomatic.

## 2020-06-15 NOTE — Assessment & Plan Note (Signed)
History of carotid artery disease with moderate right ICA stenosis by duplex ultrasound 12/29/2019.  This will be repeated on annual basis.

## 2020-06-27 DIAGNOSIS — I10 Essential (primary) hypertension: Secondary | ICD-10-CM | POA: Diagnosis not present

## 2020-06-27 DIAGNOSIS — Z5181 Encounter for therapeutic drug level monitoring: Secondary | ICD-10-CM | POA: Diagnosis not present

## 2020-08-03 DIAGNOSIS — R29898 Other symptoms and signs involving the musculoskeletal system: Secondary | ICD-10-CM | POA: Diagnosis not present

## 2020-08-03 DIAGNOSIS — I10 Essential (primary) hypertension: Secondary | ICD-10-CM | POA: Diagnosis not present

## 2020-09-12 DIAGNOSIS — H35373 Puckering of macula, bilateral: Secondary | ICD-10-CM | POA: Diagnosis not present

## 2020-09-12 DIAGNOSIS — H04123 Dry eye syndrome of bilateral lacrimal glands: Secondary | ICD-10-CM | POA: Diagnosis not present

## 2020-09-12 DIAGNOSIS — H26492 Other secondary cataract, left eye: Secondary | ICD-10-CM | POA: Diagnosis not present

## 2020-10-23 ENCOUNTER — Other Ambulatory Visit: Payer: Self-pay | Admitting: Cardiovascular Disease

## 2020-10-23 DIAGNOSIS — I4821 Permanent atrial fibrillation: Secondary | ICD-10-CM

## 2020-10-24 NOTE — Telephone Encounter (Signed)
Prescription refill request for Eliquis received. Indication:atrial fib Last office visit:3/22 Scr:0.6 Age: 80 Weight:104.3 kg  Prescription refilled

## 2020-12-09 ENCOUNTER — Encounter: Payer: Self-pay | Admitting: Gastroenterology

## 2020-12-09 ENCOUNTER — Telehealth: Payer: Self-pay

## 2020-12-09 ENCOUNTER — Ambulatory Visit: Payer: Medicare Other | Admitting: Gastroenterology

## 2020-12-09 VITALS — BP 148/60 | HR 50 | Ht 75.0 in | Wt 231.0 lb

## 2020-12-09 DIAGNOSIS — Z85038 Personal history of other malignant neoplasm of large intestine: Secondary | ICD-10-CM | POA: Diagnosis not present

## 2020-12-09 DIAGNOSIS — Z7901 Long term (current) use of anticoagulants: Secondary | ICD-10-CM

## 2020-12-09 MED ORDER — PLENVU 140 G PO SOLR
1.0000 | Freq: Once | ORAL | 0 refills | Status: AC
Start: 2020-12-09 — End: 2020-12-09

## 2020-12-09 NOTE — Telephone Encounter (Signed)
[facility] Pre-operative Risk Assessment     Request for surgical clearance:     Endoscopy Procedure  What type of surgery is being performed?     Colonoscopy  When is this surgery scheduled?     12/15/20  What type of clearance is required ?   Pharmacy  Are there any medications that need to be held prior to surgery and how long? Eliquis x 2 days  Practice name and name of physician performing surgery?      [facility]  What is your office phone and fax number?      Phone- [phone]  [phone]  Anesthesia type (None, local, MAC, general) ?       MAC

## 2020-12-09 NOTE — Telephone Encounter (Signed)
Patient with diagnosis of A Fib on Eliquis for anticoagulation.    Procedure: colonoscopy Date of procedure: 12/15/20   CHA2DS2-VASc Score = 4  This indicates a 4.8% annual risk of stroke. The patient's score is based upon: CHF History: 0 HTN History: 1 Diabetes History: 0 Stroke History: 0 Vascular Disease History: 1 Age Score: 2 Gender Score: 0    CrCl 108 ml/min Platelet count 155K  Per office protocol, patient can hold Eliquis for 2 days prior to procedure.

## 2020-12-09 NOTE — Patient Instructions (Signed)
You have been scheduled for a colonoscopy. Please follow written instructions given to you at your visit today.  °Please pick up your prep supplies at the pharmacy within the next 1-3 days. °If you use inhalers (even only as needed), please bring them with you on the day of your procedure. ° °Due to recent changes in healthcare laws, you may see the results of your imaging and laboratory studies on MyChart before your provider has had a chance to review them.  We understand that in some cases there may be results that are confusing or concerning to you. Not all laboratory results come back in the same time frame and the provider may be waiting for multiple results in order to interpret others.  Please give us 48 hours in order for your provider to thoroughly review all the results before contacting the office for clarification of your results.  ° °The [facility] providers would like to encourage you to use MYCHART to communicate with providers for non-urgent requests or questions.  Due to long hold times on the telephone, sending your provider a message by MYCHART may be a faster and more efficient way to get a response.  Please allow 48 business hours for a response.  Please remember that this is for non-urgent requests.  ° °Thank you for choosing me and [facility]. ° °Malcolm T. Stark, Jr., MD., FACG ° °

## 2020-12-09 NOTE — Progress Notes (Signed)
History of Present Illness: This is a 80 year old referred by Lavone Orn, MD for the evaluation of personal history of colon cancer.  He has no ongoing gastrointestinal complaints and feels well. Denies weight loss, abdominal pain, constipation, diarrhea, change in stool caliber, melena, hematochezia, nausea, vomiting, dysphagia, reflux symptoms, chest pain.   Colonoscopy 12/2016 - One 5 mm polyp in the rectum, removed with a cold snare. Resected and retrieved. Clip (MR conditional) was placed for hemostasis. (hyperplastic) - Right hemicolectomy. Patent end-to-side ileo-colonic anastomosis, characterized by healthy appearing mucosa. - Small internal hemorrhoids.   No Known Allergies Outpatient Medications Prior to Visit  Medication Sig Dispense Refill   Alpha Lipoic Acid 200 MG CAPS Take 600 mg by mouth daily.      aspirin 81 MG tablet Take 81 mg by mouth daily.     atorvastatin (LIPITOR) 80 MG tablet Take 1 tablet (80 mg total) by mouth daily. Patient needs appointment for any future refills. Please call [phone] to schedule appointment.  2nd attempt. 90 tablet 3   cholecalciferol (VITAMIN D) 1000 UNITS tablet Take 1,000 Units by mouth daily.     Coenzyme Q10 200 MG capsule Take 200 mg by mouth daily.      Cyanocobalamin (VITAMIN B 12 PO) Take 100 mg by mouth daily.      ELIQUIS 5 MG TABS tablet TAKE 1 TABLET BY MOUTH  TWICE DAILY 180 tablet 3   Flaxseed, Linseed, (FLAX SEED OIL PO) Take 1,400 mg by mouth daily.     magnesium oxide (MAG-OX) 400 MG tablet Take 400 mg by mouth daily.     Omega-3 Fatty Acids (FISH OIL) 1200 MG CAPS Take 1,000 mg by mouth daily.     pyridoxine (B-6) 200 MG tablet Take 200 mg by mouth daily.     telmisartan (MICARDIS) 40 MG tablet Take 40 mg by mouth daily.     vitamin C (ASCORBIC ACID) 500 MG tablet Take 500 mg by mouth 3 (three) times daily.      amLODipine (NORVASC) 5 MG tablet Take 1 tablet (5 mg total) by mouth daily for 14 days. 14 tablet 0    potassium chloride 20 MEQ TBCR Take 20 mEq by mouth daily. 90 tablet 3   No facility-administered medications prior to visit.   Past Medical History:  Diagnosis Date   A-fib ([place])    Allergy    Seasonal--pollen   Anticoagulation goal of INR 2 to 3    on coumadin   Arrhythmia    CAD (coronary artery disease) 1999   RCA stent   Carotid stenosis, asymptomatic    moderate RT and mild LT with carotid dopplers 10/26/11   Colon cancer ([place]) 04/2006   Stage III--T3 N1   COPD (chronic obstructive pulmonary disease) ([place])    H/O cardiovascular stress test 01/13/2010   low risk scan, similar to 2008 study   H/O echocardiogram 05/23/2009   EF >55%, mild MR, Mild TR,    Hand foot syndrome    Hemorrhoids    Hyperlipidemia    Hypertension    Neuropathy    Permanent atrial fibrillation (HCC)    Psoriasis    Tubular adenoma of colon    Past Surgical History:  Procedure Laterality Date   CARDIAC CATHETERIZATION     CLIPPING OF ATRIAL APPENDAGE  01/28/2018   Procedure: CLIPPING OF ATRIAL APPENDAGE;  Surgeon: Gaye Pollack, MD;  Location: [facility];  Service: Open Heart Surgery;;   COLONOSCOPY  CORONARY ARTERY BYPASS GRAFT N/A 01/28/2018   Procedure: CORONARY ARTERY BYPASS GRAFTING (CABG) X 4 USING LEFT INTERNAL MAMMARY ARTERY AND LEFT GREATER SAPHENOUS VEIN HARVESTED ENDOSCOPICALLY;  Surgeon: Gaye Pollack, MD;  Location: [facility];  Service: Open Heart Surgery;  Laterality: N/A;   CORONARY STENT PLACEMENT  1999   RCA tandem NIR stents   LEFT HEART CATH AND CORONARY ANGIOGRAPHY N/A 01/23/2018   Procedure: LEFT HEART CATH AND CORONARY ANGIOGRAPHY;  Surgeon: Nelva Bush, MD;  Location: [facility];  Service: Cardiovascular;  Laterality: N/A;   RIGHT COLECTOMY  05/29/2006   Laparoscopic assisted   TEE WITHOUT CARDIOVERSION N/A 01/28/2018   Procedure: TRANSESOPHAGEAL ECHOCARDIOGRAM (TEE);  Surgeon: Gaye Pollack, MD;  Location: [facility];  Service: Open Heart Surgery;  Laterality: N/A;    TONSILLECTOMY     Social History   Socioeconomic History   Marital status: Married    Spouse name: [name]   Number of children: 2   Years of education: Not on file   Highest education level: Bachelor's degree (e.g., BA, AB, BS)  Occupational History   Occupation: retired    Fish farm manager: RETIRED  Tobacco Use   Smoking status: Former    Years: 30.00    Types: Cigarettes    Quit date: 11/25/1993    Years since quitting: 27.0   Smokeless tobacco: Never  Vaping Use   Vaping Use: Never used  Substance and Sexual Activity   Alcohol use: Yes    Alcohol/week: 1.0 standard drink    Types: 1 Cans of beer per week   Drug use: No   Sexual activity: Not on file  Other Topics Concern   Not on file  Social History Narrative   Not on file   Social Determinants of Health   Financial Resource Strain: Not on file  Food Insecurity: Not on file  Transportation Needs: Not on file  Physical Activity: Not on file  Stress: Not on file  Social Connections: Not on file   Family History  Problem Relation Age of Onset   Heart disease Father 59   Heart disease Paternal Grandmother    Heart disease Mother    Hypertension Mother    Healthy Sister    Heart disease Paternal Grandfather    Hypertension Paternal Grandfather    Colon cancer Neg Hx        Review of Systems: Pertinent positive and negative review of systems were noted in the above HPI section. All other review of systems were otherwise negative.   Physical Exam: General: Well developed, well nourished, no acute distress Head: Normocephalic and atraumatic Eyes: Sclerae anicteric, EOMI Ears: Normal auditory acuity Mouth: Not examined, mask on during Covid-19 pandemic Neck: Supple, no masses or thyromegaly Lungs: Clear throughout to auscultation Heart: Regular rate and rhythm; no murmurs, rubs or bruits Abdomen: Soft, non tender and non distended. No masses, hepatosplenomegaly or hernias noted. Normal Bowel sounds Rectal:  Deferred to colonoscopy Musculoskeletal: Symmetrical with no gross deformities  Skin: No lesions on visible extremities Pulses:  Normal pulses noted Extremities: No clubbing, cyanosis, edema or deformities noted Neurological: Alert oriented x 4, grossly nonfocal Cervical Nodes:  No significant cervical adenopathy Inguinal Nodes: No significant inguinal adenopathy Psychological:  Alert and cooperative. Normal mood and affect   Assessment and Recommendations:  Personal history of stage III colon cancer in 2008.  Status post right hemicolectomy.  We discussed proceeding with surveillance colonoscopy versus no colonoscopy due to his age and lack of precancerous  polyps on his last colonoscopy.  He would like to proceed with colonoscopy which is reasonable. The risks (including bleeding, perforation, infection, missed lesions, medication reactions and possible hospitalization or surgery if complications occur), benefits, and alternatives to colonoscopy with possible biopsy and possible polypectomy were discussed with the patient and they consent to proceed.   A fib on Eliquis. Hold Eliquis 2 days before procedure - will instruct when and how to resume after procedure. Low but real risk of cardiovascular event such as heart attack, stroke, embolism, thrombosis or ischemia/infarct of other organs off Eliquis explained and need to seek urgent help if this occurs. The patient consents to proceed. Will communicate by phone or EMR with patient's prescribing provider to confirm that holding Eliquis is reasonable in this case.     cc: Lavone Orn, MD [address] [place],  [place] [id number]

## 2020-12-09 NOTE — Telephone Encounter (Signed)
Clinical pharmacist to review Eliquis 

## 2020-12-12 NOTE — Telephone Encounter (Signed)
Left message for patient to return my call.

## 2020-12-13 ENCOUNTER — Telehealth: Payer: Self-pay | Admitting: Cardiovascular Disease

## 2020-12-13 NOTE — Telephone Encounter (Signed)
Spoke to pt. He report for the past few months he's been experiencing off and on SOB with exertions. He denies swelling or significant weight increase but state the SOB is more pronounced today. He also report today while taking out the trash, his legs felt very fatigued but denies feeling lightheaded or dizzy.   Appointment scheduled for tomorrow 9/28 with [name], NP for further evaluations.

## 2020-12-13 NOTE — Telephone Encounter (Signed)
Pt c/o Shortness Of Breath: STAT if SOB developed within the last 24 hours or pt is noticeably SOB on the phone  1. Are you currently SOB (can you hear that pt is SOB on the phone)? no  2. How long have you been experiencing SOB? Couple months, but more today  3. Are you SOB when sitting or when up moving around? Moving around  4. Are you currently experiencing any other symptoms? no   Patent states he was taking his trash out and his legs got fatigued just going to drive way. He states he has been SOB for a couple months, but it was more pronounced today. He says he is not having any other symptoms.

## 2020-12-13 NOTE — Telephone Encounter (Signed)
Informed patient he can hold Eliquis 2 days prior to his procedure. Patient verbalized understanding. °

## 2020-12-14 ENCOUNTER — Encounter (HOSPITAL_BASED_OUTPATIENT_CLINIC_OR_DEPARTMENT_OTHER): Payer: Self-pay | Admitting: Family

## 2020-12-14 ENCOUNTER — Ambulatory Visit (HOSPITAL_BASED_OUTPATIENT_CLINIC_OR_DEPARTMENT_OTHER): Payer: Medicare Other | Admitting: Family

## 2020-12-14 ENCOUNTER — Other Ambulatory Visit: Payer: Self-pay

## 2020-12-14 VITALS — BP 140/60 | HR 62 | Ht 75.0 in | Wt 232.0 lb

## 2020-12-14 DIAGNOSIS — I4821 Permanent atrial fibrillation: Secondary | ICD-10-CM | POA: Diagnosis not present

## 2020-12-14 DIAGNOSIS — E785 Hyperlipidemia, unspecified: Secondary | ICD-10-CM | POA: Diagnosis not present

## 2020-12-14 DIAGNOSIS — R0602 Shortness of breath: Secondary | ICD-10-CM | POA: Diagnosis not present

## 2020-12-14 DIAGNOSIS — I25118 Atherosclerotic heart disease of native coronary artery with other forms of angina pectoris: Secondary | ICD-10-CM

## 2020-12-14 DIAGNOSIS — Z951 Presence of aortocoronary bypass graft: Secondary | ICD-10-CM | POA: Diagnosis not present

## 2020-12-14 DIAGNOSIS — D6869 Other thrombophilia: Secondary | ICD-10-CM | POA: Diagnosis not present

## 2020-12-14 DIAGNOSIS — I482 Chronic atrial fibrillation, unspecified: Secondary | ICD-10-CM

## 2020-12-14 DIAGNOSIS — I10 Essential (primary) hypertension: Secondary | ICD-10-CM | POA: Diagnosis not present

## 2020-12-14 DIAGNOSIS — I6521 Occlusion and stenosis of right carotid artery: Secondary | ICD-10-CM

## 2020-12-14 MED ORDER — FUROSEMIDE 20 MG PO TABS: ORAL_TABLET | ORAL | 1 refills | Status: DC

## 2020-12-14 NOTE — Progress Notes (Addendum)
Office Visit    Patient Name: [name] Date of Encounter: 12/20/2020  PCP:  Lavone Orn, [facility]  Cardiologist:  [name], MD  Advanced Practice Provider:  No care team member to display Electrophysiologist:  None      Chief Complaint    [name] is a 80 y.o. male with a hx of CAD s/p RCA stenting 1999 and subsequent CABG 01/2018, HTN, HLD, chronic atrial fibrillation, carotid stenosis presents today for shortness of breath  Past Medical History    Past Medical History:  Diagnosis Date   A-fib ([place])    Allergy    Seasonal--pollen   Anticoagulation goal of INR 2 to 3    on coumadin   Arrhythmia    CAD (coronary artery disease) 1999   RCA stent   Carotid stenosis, asymptomatic    moderate RT and mild LT with carotid dopplers 10/26/11   Colon cancer ([place]) 04/2006   Stage III--T3 N1   COPD (chronic obstructive pulmonary disease) ([place])    H/O cardiovascular stress test 01/13/2010   low risk scan, similar to 2008 study   H/O echocardiogram 05/23/2009   EF >55%, mild MR, Mild TR,    Hand foot syndrome    Hemorrhoids    Hyperlipidemia    Hypertension    Neuropathy    Permanent atrial fibrillation ([place])    Psoriasis    Tubular adenoma of colon    Past Surgical History:  Procedure Laterality Date   CARDIAC CATHETERIZATION     CLIPPING OF ATRIAL APPENDAGE  01/28/2018   Procedure: CLIPPING OF ATRIAL APPENDAGE;  Surgeon: Gaye Pollack, MD;  Location: [facility];  Service: Open Heart Surgery;;   COLONOSCOPY     CORONARY ARTERY BYPASS GRAFT N/A 01/28/2018   Procedure: CORONARY ARTERY BYPASS GRAFTING (CABG) X 4 USING LEFT INTERNAL MAMMARY ARTERY AND LEFT GREATER SAPHENOUS VEIN HARVESTED ENDOSCOPICALLY;  Surgeon: Gaye Pollack, MD;  Location: [place];  Service: Open Heart Surgery;  Laterality: N/A;   CORONARY STENT PLACEMENT  1999   RCA tandem NIR stents   LEFT HEART CATH AND CORONARY ANGIOGRAPHY N/A 01/23/2018   Procedure:  LEFT HEART CATH AND CORONARY ANGIOGRAPHY;  Surgeon: Nelva Bush, MD;  Location: [facility];  Service: Cardiovascular;  Laterality: N/A;   RIGHT COLECTOMY  05/29/2006   Laparoscopic assisted   TEE WITHOUT CARDIOVERSION N/A 01/28/2018   Procedure: TRANSESOPHAGEAL ECHOCARDIOGRAM (TEE);  Surgeon: Gaye Pollack, MD;  Location: [facility];  Service: Open Heart Surgery;  Laterality: N/A;   TONSILLECTOMY      Allergies  No Known Allergies  History of Present Illness    [name] is a 80 y.o. male with a hx of CAD s/p RCA stenting 1999 and subsequent CABG 01/2018, HTN, HLD, chronic atrial fibrillation, carotid stenosis last seen 06/15/2020 by Dr. Gwenlyn Found.  He underwent RCA stenting by Dr. Alvester Chou January 11, 1989 99.  He had normal circumflex, LAD, EF.  He had colon cancer noted by screening colonoscopy and underwent right colectomy 05/29/2006 followed by 11 cycles of adjuvant FOLFOX chemotherapy.  He was admitted to the hospital 01/21/2018 with NSTEMI and underwent cardiac catheterization revealing left main disease as well as LAD, RCA disease.  He underwent CABG by Dr. Cyndia Bent 01/28/2018 with LIMA-LAD, SVG-obtuse marginal branch, PDA, PLA sequentially.  He also left atrial clipping at that time.  He was last seen 06/15/2020 doing overall well from a cardiac perspective  and continuing to stay active in the interim.  He was recommended for annual follow-up.  He contacted the office yesterday noticing shortness of breath and was scheduled for follow-up.  Very pleasant gentleman who presents today for follow up. He is a retired Chief Financial Officer.  He has 2 grandsons who are 46 and 15 years old.  Tells me he gets winded easily but this is overall intermittent. Yesterday morning he had an episode while rolling his trashcan out to the curb. Tells me the fronts of his legs suddenly felt weak as if he has walked miles. He was having trouble catching his breath so he sat down in his recliner. It resolved with  rest. Today felt a little bit of breathlessness which was brief. No lightheadedness, dizziness, chest pain. Occasional sensation of fatigue in his legs. He walks on treadmill 2 days per week and resistance training 2 days per week. No exertional dyspnea nor claudication with these activities.    EKGs/Labs/Other Studies Reviewed:   The following studies were reviewed today:   EKG:  EKG is  ordered today.  The ekg ordered today demonstrates rate controlled atrial fibrillation 62 bpm with stable bifascicular block (RBBB, LAFB) with stable T wave inversion noted in V2.  No acute ST/T changes.  Recent Labs: 12/14/2020: ALT 31; BUN 18; Creatinine, Ser 0.83; Hemoglobin 14.3; Platelets 152; Potassium 5.2; Sodium 135  Recent Lipid Panel    Component Value Date/Time   CHOL 135 01/22/2018 0347   TRIG 71 01/22/2018 0347   HDL 50 01/22/2018 0347   CHOLHDL 2.7 01/22/2018 0347   VLDL 14 01/22/2018 0347   LDLCALC 71 01/22/2018 0347    Risk Assessment/Calculations:   CHA2DS2-VASc Score = 4   This indicates a 4.8% annual risk of stroke. The patient's score is based upon: CHF History: 0 HTN History: 1 Diabetes History: 0 Stroke History: 0 Vascular Disease History: 1 Age Score: 2 Gender Score: 0   Home Medications   Current Meds  Medication Sig   Alpha Lipoic Acid 200 MG CAPS Take 600 mg by mouth daily.    aspirin 81 MG tablet Take 81 mg by mouth daily.   atorvastatin (LIPITOR) 80 MG tablet Take 1 tablet (80 mg total) by mouth daily. Patient needs appointment for any future refills. Please call [phone] to schedule appointment.  2nd attempt.   cholecalciferol (VITAMIN D) 1000 UNITS tablet Take 1,000 Units by mouth daily.   Coenzyme Q10 200 MG capsule Take 200 mg by mouth daily.    Cyanocobalamin (VITAMIN B 12 PO) Take 100 mg by mouth daily.    ELIQUIS 5 MG TABS tablet TAKE 1 TABLET BY MOUTH  TWICE DAILY   Flaxseed, Linseed, (FLAX SEED OIL PO) Take 1,400 mg by mouth daily.   furosemide  (LASIX) 20 MG tablet Take one tablet daily for five days. Then take as needed for fluid retention, shortness of breath.   magnesium oxide (MAG-OX) 400 MG tablet Take 400 mg by mouth daily.   Omega-3 Fatty Acids (FISH OIL) 1200 MG CAPS Take 1,000 mg by mouth daily.   pyridoxine (B-6) 200 MG tablet Take 200 mg by mouth daily.   telmisartan (MICARDIS) 40 MG tablet Take 40 mg by mouth daily.   vitamin C (ASCORBIC ACID) 500 MG tablet Take 500 mg by mouth 3 (three) times daily.      Review of Systems      All other systems reviewed and are otherwise negative except as noted above.  Physical Exam  VS:  BP 140/60 (BP Location: Left Arm, Patient Position: Sitting, Cuff Size: Normal)   Pulse 62   Ht 6\' 3"  (1.905 m)   Wt 232 lb (105.2 kg)   SpO2 95%   BMI 29.00 kg/m  , BMI Body mass index is 29 kg/m.  Wt Readings from Last 3 Encounters:  12/14/20 232 lb (105.2 kg)  12/09/20 231 lb (104.8 kg)  06/15/20 230 lb (104.3 kg)     GEN: Well nourished, well developed, in no acute distress. HEENT: normal. Neck: Supple, no JVD, carotid bruits, or masses. Cardiac: RRR, no murmurs, rubs, or gallops. No clubbing, cyanosis, edema.  Radials/PT 2+ and equal bilaterally.  Respiratory:  Respirations regular and unlabored, clear to auscultation bilaterally. GI: Soft, nontender, nondistended. MS: No deformity or atrophy. Skin: Warm and dry, no rash. Neuro:  Strength and sensation are intact. Psych: Normal affect.  Assessment & Plan    Shortness of breath-reports periodic episodes of dyspnea often associated with exertion.  They are infrequent and following no clear pattern.  Relieved by rest.  Concern for volume overload.  Will Rx Lasix 20 mg daily for 5 days then as needed.  Update echocardiogram to reassess LVEF and valvular function.  EKG with no acute ST/T wave changes, will defer ischemic evaluation though could be considered if echocardiogram unremarkable.  Plan for CBC to rule out anemia as  contributory.  Permanent atrial fibrillation /chronic anticoagulation-rate controlled today without AV nodal blocking therapy.  Continue Eliquis 5 mg twice daily.  Does not meet dose reduction criteria.  Denies bleeding complications.  CBC for monitoring.  Elevated liver enzymes - elevated liver enzymes by most recent labs with PCP.  Repeat CMP for monitoring.  HTN - BP mildly elevated today. Anticipate volume overload, as above. Readdress at follow up after diuresis. Home monitoring encouraged.   Carotid stenosis-moderate right ICA stenosis 12/2019.  Upcoming repeat carotid duplex scheduled for 12/29/2020.  CAD s/p CABG X4-GDMT includes atorvastatin, aspirin.  No beta-blocker due to baseline bradycardia. No chest pain, pressure, tightness. GDMT include atorvastatin. No aspirin due to chronic anticoagulation.   HLD, LDL goal is an 70-12/2019 LDL 57.  Continue atorvastatin 80 mg daily.  Disposition: Follow up in 2 month(s) with Dr. Gwenlyn Found or APP.  Signed, Loel Dubonnet, NP 12/20/2020, 7:37 PM [facility]

## 2020-12-14 NOTE — Patient Instructions (Addendum)
Medication Instructions:  Your physician has recommended you make the following change in your medication:   START Furosemide 20mg  daily for 5 days. Then take as needed for shortness of breath or swelling.   Recommend taking Zytrec or Claritin (store brand is fine) daily for post-nasal drip.    *If you need a refill on your cardiac medications before your next appointment, please call your pharmacy*  Lab Work: Your physician recommends that you return for lab work today: CMP, CBC  If you have labs (blood work) drawn today and your tests are completely normal, you will receive your results only by: MyChart Message (if you have MyChart) OR A paper copy in the mail If you have any lab test that is abnormal or we need to change your treatment, we will call you to review the results.  Testing/Procedures: Your EKG today shows rated controlled atrial fibrillation and was stable compared to previous  Your physician has requested that you have an echocardiogram. Echocardiography is a painless test that uses sound waves to create images of your heart. It provides your doctor with information about the size and shape of your heart and how well your heart's chambers and valves are working. This procedure takes approximately one hour. There are no restrictions for this procedure.   Follow-Up: At [facility], you and your health needs are our priority.  As part of our continuing mission to provide you with exceptional heart care, we have created designated Provider Care Teams.  These Care Teams include your primary Cardiologist (physician) and Advanced Practice Providers (APPs -  Physician Assistants and Nurse Practitioners) who all work together to provide you with the care you need, when you need it.  We recommend signing up for the patient portal called "MyChart".  Sign up information is provided on this After Visit Summary.  MyChart is used to connect with patients for Virtual Visits  (Telemedicine).  Patients are able to view lab/test results, encounter notes, upcoming appointments, etc.  Non-urgent messages can be sent to your provider as well.   To learn more about what you can do with MyChart, go to [email or web address].    Your next appointment:   2 month(s)  The format for your next appointment:   In Person  Provider:   You may see [name], MD or one of the following Advanced Practice Providers on your designated Care Team:   Almyra Deforest, PA-C Fabian Sharp, PA-C or  Roby Lofts, [place]  Other Instructions  Heart Healthy Diet Recommendations: A low-salt diet is recommended. Meats should be grilled, baked, or boiled. Avoid fried foods. Focus on lean protein sources like fish or chicken with vegetables and fruits. The American Heart Association is a Microbiologist!    Exercise recommendations: The American Heart Association recommends 150 minutes of moderate intensity exercise weekly. Try 30 minutes of moderate intensity exercise 4-5 times per week. This could include walking, jogging, or swimming.

## 2020-12-15 ENCOUNTER — Encounter: Payer: Medicare Other | Admitting: Gastroenterology

## 2020-12-15 LAB — COMPREHENSIVE METABOLIC PANEL
ALT: 31 IU/L (ref 0–44)
AST: 29 IU/L (ref 0–40)
Albumin/Globulin Ratio: 2.6 — ABNORMAL HIGH (ref 1.2–2.2)
Albumin: 5.2 g/dL — ABNORMAL HIGH (ref 3.7–4.7)
Alkaline Phosphatase: 98 IU/L (ref 44–121)
BUN/Creatinine Ratio: 22 (ref 10–24)
BUN: 18 mg/dL (ref 8–27)
Bilirubin Total: 1.7 mg/dL — ABNORMAL HIGH (ref 0.0–1.2)
CO2: 22 mmol/L (ref 20–29)
Calcium: 9.7 mg/dL (ref 8.6–10.2)
Chloride: 98 mmol/L (ref 96–106)
Creatinine, Ser: 0.83 mg/dL (ref 0.76–1.27)
Globulin, Total: 2 g/dL (ref 1.5–4.5)
Glucose: 97 mg/dL (ref 70–99)
Potassium: 5.2 mmol/L (ref 3.5–5.2)
Sodium: 135 mmol/L (ref 134–144)
Total Protein: 7.2 g/dL (ref 6.0–8.5)
eGFR: 90 mL/min/{1.73_m2} (ref >59)

## 2020-12-15 LAB — CBC
Hematocrit: 42.2 % (ref 37.5–51.0)
Hemoglobin: 14.3 g/dL (ref 13.0–17.7)
MCH: 31.8 pg (ref 26.6–33.0)
MCHC: 33.9 g/dL (ref 31.5–35.7)
MCV: 94 fL (ref 79–97)
Platelets: 152 10*3/uL (ref 150–450)
RBC: 4.49 x10E6/uL (ref 4.14–5.80)
RDW: 12.3 % (ref 11.6–15.4)
WBC: 8.3 10*3/uL (ref 3.4–10.8)

## 2020-12-20 ENCOUNTER — Ambulatory Visit (INDEPENDENT_AMBULATORY_CARE_PROVIDER_SITE_OTHER): Payer: Medicare Other

## 2020-12-20 ENCOUNTER — Other Ambulatory Visit: Payer: Self-pay

## 2020-12-20 DIAGNOSIS — R0602 Shortness of breath: Secondary | ICD-10-CM | POA: Diagnosis not present

## 2020-12-20 LAB — ECHOCARDIOGRAM COMPLETE
Area-P 1/2: 2.92 cm2
MV M vel: 4.08 m/s
MV Peak grad: 66.6 mmHg
S' Lateral: 2.87 cm

## 2020-12-21 ENCOUNTER — Telehealth: Payer: Self-pay | Admitting: Nurse Practitioner

## 2020-12-21 DIAGNOSIS — R0602 Shortness of breath: Secondary | ICD-10-CM | POA: Diagnosis not present

## 2020-12-21 DIAGNOSIS — I10 Essential (primary) hypertension: Secondary | ICD-10-CM

## 2020-12-21 DIAGNOSIS — I4811 Longstanding persistent atrial fibrillation: Secondary | ICD-10-CM

## 2020-12-21 DIAGNOSIS — Z951 Presence of aortocoronary bypass graft: Secondary | ICD-10-CM

## 2020-12-21 MED ORDER — FUROSEMIDE 20 MG PO TABS
20.0000 mg | ORAL_TABLET | Freq: Every day | ORAL | 3 refills | Status: DC
Start: 2020-12-21 — End: 2021-01-02

## 2020-12-21 NOTE — Telephone Encounter (Signed)
Reviewed results and plan of care. Spent > 15 minutes answering patient's questions. He agrees to start furosemide 20 mg daily. He will go to [facility] office for lab work today and again on 10/19. He requested additional lifestyle interventions that might help his TR and elevated pulmonary pressure and I advised low sodium diet, good hydration, good BP control and 30 minutes moderate pace walking daily, broken down into comfortable increments due to his SOB. The patient was very appreciative of the call and the time I took answering his questions.

## 2020-12-21 NOTE — Telephone Encounter (Signed)
-----   Message from Loel Dubonnet, NP sent at 12/21/2020  8:11 AM EDT ----- Echocardiogram shows hyperdynamic function. There is severely elevated pressure in the lungs which is likely the cause of new severe tricuspid regurgitation. Mild dilation of ascending aorta.   Recommend BMP, BNP this week to evaluate kidney function, volume status. Recommend Lasix 20mg  QD. Repeat BMP, BNP in 2 weeks. Follow up as scheduled with Dr. Gwenlyn Found 03/22/20.  CC'd Dr. Gwenlyn Found as Juluis Rainier.

## 2020-12-22 ENCOUNTER — Telehealth: Payer: Self-pay

## 2020-12-22 DIAGNOSIS — E875 Hyperkalemia: Secondary | ICD-10-CM

## 2020-12-22 LAB — BASIC METABOLIC PANEL
BUN/Creatinine Ratio: 25 — ABNORMAL HIGH (ref 10–24)
BUN: 25 mg/dL (ref 8–27)
CO2: 21 mmol/L (ref 20–29)
Calcium: 10.1 mg/dL (ref 8.6–10.2)
Chloride: 98 mmol/L (ref 96–106)
Creatinine, Ser: 0.99 mg/dL (ref 0.76–1.27)
Glucose: 82 mg/dL (ref 70–99)
Potassium: 5.3 mmol/L — ABNORMAL HIGH (ref 3.5–5.2)
Sodium: 137 mmol/L (ref 134–144)
eGFR: 78 mL/min/{1.73_m2} (ref >59)

## 2020-12-22 LAB — PRO B NATRIURETIC PEPTIDE: NT-Pro BNP: 347 pg/mL (ref 0–486)

## 2020-12-22 NOTE — Telephone Encounter (Signed)
   Pt is returning call, he said if unable to answer phone to leave him a detailed message

## 2020-12-22 NOTE — Telephone Encounter (Signed)
Results released to my Chart. Left message for patient to call.

## 2020-12-22 NOTE — Telephone Encounter (Signed)
Spoke with patient regarding potassium. Advised patient to avoid his morning banana and to avoid the [place] that he uses. Will mail lab orders to his home address

## 2020-12-22 NOTE — Telephone Encounter (Signed)
-----   Message from Loel Dubonnet, NP sent at 12/22/2020  7:51 AM EDT ----- Lab work shows normal kidney function. Potassium mildly elevated. Furosemide (Lasix) was added yesterday and will actually help to lower the potassium level. Repeat labs already ordered for next week. Please ensure not taking potassium supplement.

## 2020-12-27 DIAGNOSIS — E875 Hyperkalemia: Secondary | ICD-10-CM | POA: Diagnosis not present

## 2020-12-27 DIAGNOSIS — R0602 Shortness of breath: Secondary | ICD-10-CM | POA: Diagnosis not present

## 2020-12-28 LAB — BASIC METABOLIC PANEL
BUN/Creatinine Ratio: 20 (ref 10–24)
BUN: 20 mg/dL (ref 8–27)
CO2: 23 mmol/L (ref 20–29)
Calcium: 9 mg/dL (ref 8.6–10.2)
Chloride: 99 mmol/L (ref 96–106)
Creatinine, Ser: 0.98 mg/dL (ref 0.76–1.27)
Glucose: 97 mg/dL (ref 70–99)
Potassium: 4.9 mmol/L (ref 3.5–5.2)
Sodium: 138 mmol/L (ref 134–144)
eGFR: 79 mL/min/{1.73_m2} (ref >59)

## 2020-12-28 LAB — PRO B NATRIURETIC PEPTIDE: NT-Pro BNP: 312 pg/mL (ref 0–486)

## 2020-12-29 ENCOUNTER — Other Ambulatory Visit: Payer: Self-pay

## 2020-12-29 ENCOUNTER — Ambulatory Visit (HOSPITAL_COMMUNITY)
Admission: RE | Admit: 2020-12-29 | Discharge: 2020-12-29 | Disposition: A | Discharge status: Home / Self Care | Payer: Medicare Other | Source: Ambulatory Visit | Attending: Cardiology | Admitting: Cardiology

## 2020-12-29 DIAGNOSIS — I6521 Occlusion and stenosis of right carotid artery: Secondary | ICD-10-CM

## 2020-12-30 ENCOUNTER — Encounter (HOSPITAL_BASED_OUTPATIENT_CLINIC_OR_DEPARTMENT_OTHER): Payer: Self-pay

## 2020-12-30 ENCOUNTER — Telehealth (HOSPITAL_BASED_OUTPATIENT_CLINIC_OR_DEPARTMENT_OTHER): Payer: Self-pay | Admitting: Family

## 2020-12-30 DIAGNOSIS — R0602 Shortness of breath: Secondary | ICD-10-CM

## 2020-12-30 DIAGNOSIS — C6932 Malignant neoplasm of left choroid: Secondary | ICD-10-CM | POA: Diagnosis not present

## 2020-12-30 DIAGNOSIS — I10 Essential (primary) hypertension: Secondary | ICD-10-CM

## 2020-12-30 DIAGNOSIS — H04123 Dry eye syndrome of bilateral lacrimal glands: Secondary | ICD-10-CM | POA: Diagnosis not present

## 2020-12-30 DIAGNOSIS — H43391 Other vitreous opacities, right eye: Secondary | ICD-10-CM | POA: Diagnosis not present

## 2020-12-30 DIAGNOSIS — I25118 Atherosclerotic heart disease of native coronary artery with other forms of angina pectoris: Secondary | ICD-10-CM

## 2020-12-30 DIAGNOSIS — H43812 Vitreous degeneration, left eye: Secondary | ICD-10-CM | POA: Diagnosis not present

## 2020-12-30 DIAGNOSIS — Z951 Presence of aortocoronary bypass graft: Secondary | ICD-10-CM

## 2020-12-30 NOTE — Telephone Encounter (Signed)
Pt c/o medication issue:  1. Name of Medication: furosemide (LASIX) 20 MG tablet  2. How are you currently taking this medication (dosage and times per day)?   3. Are you having a reaction (difficulty breathing--STAT)?   4. What is your medication issue? Patient wanted to talk to [place] about this medication

## 2020-12-30 NOTE — Telephone Encounter (Signed)
Echo results: Echocardiogram shows hyperdynamic function. There is severely elevated pressure in the lungs which is likely the cause of new severe tricuspid regurgitation. Mild dilation of ascending aorta.    Recommend BMP, BNP this week to evaluate kidney function, volume status. Recommend Lasix 20mg  QD. Repeat BMP, BNP in 2 weeks. Follow up as scheduled with Dr. Gwenlyn Found 03/22/20.  Lab results: Normal kidney function and electrolytes. BNP with no evidence of fluid overload. Continue current plan of care.   Routing to [place], NP to advise pt. Pt calling about Furosemide.   He stated he is still having SOB--not as pronounced as before but still there. He stated he was short of breath when he was walking out of a restaurant last night. He states he is trying to walk more, not sitting for more than 45 minutes and elevating his legs more.   He is asking if Furosemide dose is maybe not enough or if there is something else he should be trying.   He stated he has an appointment today at 2:30 but will be back home by 3:30--just an FYI for call back.

## 2020-12-30 NOTE — Telephone Encounter (Signed)
Left detailed message with recommendations--ok per DPR. Told pt I will also send MyChart message. Informed him I will call him again on Monday or he could send MyChart message to let us know if he decided to increase Lasix to 40 mg over the weekend. Informed pt if he does increase the lasix, will need labs in 2 weeks, so could send him some lab slips.  Routing to [place], NP as Juluis Rainier

## 2020-12-30 NOTE — Telephone Encounter (Signed)
If still with shortness of breath could trial Lasix 40mg  daily with repeat BMP in 2 weeks and assess response.   Loel Dubonnet, NP

## 2021-01-02 MED ORDER — FUROSEMIDE 40 MG PO TABS: 40.0000 mg | ORAL_TABLET | Freq: Every day | ORAL | 1 refills | Status: DC

## 2021-01-02 NOTE — Telephone Encounter (Signed)
Pt responded:  [name] "[name]"  You 3 days ago   Thank you so much for your response.  I will begin the 40 mg tomorrow as directed. With the current amount of the original prescription , I have enough left for  seven days.  There is one refill remaining on the current prescription. Thanks agin for your timely and appreciated response. [name]  --Lab slips mailed to patient to check BMET in 2 weeks. New Rx sent to pt pharmacy with updated dosage. MyChart message sent to inform pt.

## 2021-01-03 DIAGNOSIS — H35423 Microcystoid degeneration of retina, bilateral: Secondary | ICD-10-CM | POA: Diagnosis not present

## 2021-01-03 DIAGNOSIS — H3562 Retinal hemorrhage, left eye: Secondary | ICD-10-CM | POA: Diagnosis not present

## 2021-01-03 DIAGNOSIS — H43813 Vitreous degeneration, bilateral: Secondary | ICD-10-CM | POA: Diagnosis not present

## 2021-01-03 DIAGNOSIS — H3322 Serous retinal detachment, left eye: Secondary | ICD-10-CM | POA: Diagnosis not present

## 2021-01-03 DIAGNOSIS — H35052 Retinal neovascularization, unspecified, left eye: Secondary | ICD-10-CM | POA: Diagnosis not present

## 2021-01-06 DIAGNOSIS — I482 Chronic atrial fibrillation, unspecified: Secondary | ICD-10-CM | POA: Diagnosis not present

## 2021-01-06 DIAGNOSIS — I10 Essential (primary) hypertension: Secondary | ICD-10-CM | POA: Diagnosis not present

## 2021-01-06 DIAGNOSIS — I2510 Atherosclerotic heart disease of native coronary artery without angina pectoris: Secondary | ICD-10-CM | POA: Diagnosis not present

## 2021-01-06 DIAGNOSIS — Z23 Encounter for immunization: Secondary | ICD-10-CM | POA: Diagnosis not present

## 2021-01-06 DIAGNOSIS — Z85038 Personal history of other malignant neoplasm of large intestine: Secondary | ICD-10-CM | POA: Diagnosis not present

## 2021-01-06 DIAGNOSIS — I509 Heart failure, unspecified: Secondary | ICD-10-CM | POA: Diagnosis not present

## 2021-01-06 DIAGNOSIS — Z0000 Encounter for general adult medical examination without abnormal findings: Secondary | ICD-10-CM | POA: Diagnosis not present

## 2021-01-06 DIAGNOSIS — E782 Mixed hyperlipidemia: Secondary | ICD-10-CM | POA: Diagnosis not present

## 2021-01-06 DIAGNOSIS — D6869 Other thrombophilia: Secondary | ICD-10-CM | POA: Diagnosis not present

## 2021-01-06 DIAGNOSIS — Z1389 Encounter for screening for other disorder: Secondary | ICD-10-CM | POA: Diagnosis not present

## 2021-01-10 DIAGNOSIS — H3322 Serous retinal detachment, left eye: Secondary | ICD-10-CM | POA: Diagnosis not present

## 2021-01-10 DIAGNOSIS — H35052 Retinal neovascularization, unspecified, left eye: Secondary | ICD-10-CM | POA: Diagnosis not present

## 2021-01-10 DIAGNOSIS — H35423 Microcystoid degeneration of retina, bilateral: Secondary | ICD-10-CM | POA: Diagnosis not present

## 2021-01-10 DIAGNOSIS — H3562 Retinal hemorrhage, left eye: Secondary | ICD-10-CM | POA: Diagnosis not present

## 2021-01-12 DIAGNOSIS — I10 Essential (primary) hypertension: Secondary | ICD-10-CM | POA: Diagnosis not present

## 2021-01-12 DIAGNOSIS — Z951 Presence of aortocoronary bypass graft: Secondary | ICD-10-CM | POA: Diagnosis not present

## 2021-01-12 DIAGNOSIS — R0602 Shortness of breath: Secondary | ICD-10-CM | POA: Diagnosis not present

## 2021-01-12 DIAGNOSIS — I25118 Atherosclerotic heart disease of native coronary artery with other forms of angina pectoris: Secondary | ICD-10-CM | POA: Diagnosis not present

## 2021-01-13 ENCOUNTER — Other Ambulatory Visit: Payer: Self-pay

## 2021-01-13 DIAGNOSIS — E875 Hyperkalemia: Secondary | ICD-10-CM

## 2021-01-13 DIAGNOSIS — I10 Essential (primary) hypertension: Secondary | ICD-10-CM

## 2021-01-13 LAB — BASIC METABOLIC PANEL
BUN/Creatinine Ratio: 18 (ref 10–24)
BUN: 17 mg/dL (ref 8–27)
CO2: 24 mmol/L (ref 20–29)
Calcium: 10.2 mg/dL (ref 8.6–10.2)
Chloride: 98 mmol/L (ref 96–106)
Creatinine, Ser: 0.93 mg/dL (ref 0.76–1.27)
Glucose: 93 mg/dL (ref 70–99)
Potassium: 5.8 mmol/L — ABNORMAL HIGH (ref 3.5–5.2)
Sodium: 139 mmol/L (ref 134–144)
eGFR: 85 mL/min/{1.73_m2} (ref >59)

## 2021-01-13 MED ORDER — TELMISARTAN 20 MG PO TABS: 20.0000 mg | ORAL_TABLET | Freq: Every day | ORAL | 3 refills | Status: DC

## 2021-01-16 DIAGNOSIS — E875 Hyperkalemia: Secondary | ICD-10-CM | POA: Diagnosis not present

## 2021-01-16 DIAGNOSIS — I10 Essential (primary) hypertension: Secondary | ICD-10-CM | POA: Diagnosis not present

## 2021-01-17 LAB — BASIC METABOLIC PANEL
BUN/Creatinine Ratio: 16 (ref 10–24)
BUN: 16 mg/dL (ref 8–27)
CO2: 20 mmol/L (ref 20–29)
Calcium: 10.2 mg/dL (ref 8.6–10.2)
Chloride: 93 mmol/L — ABNORMAL LOW (ref 96–106)
Creatinine, Ser: 0.99 mg/dL (ref 0.76–1.27)
Glucose: 96 mg/dL (ref 70–99)
Potassium: 5.6 mmol/L — ABNORMAL HIGH (ref 3.5–5.2)
Sodium: 135 mmol/L (ref 134–144)
eGFR: 78 mL/min/{1.73_m2} (ref >59)

## 2021-01-23 ENCOUNTER — Other Ambulatory Visit: Payer: Self-pay

## 2021-01-23 DIAGNOSIS — E875 Hyperkalemia: Secondary | ICD-10-CM

## 2021-01-23 MED ORDER — AMLODIPINE BESYLATE 2.5 MG PO TABS: 2.5000 mg | ORAL_TABLET | Freq: Every day | ORAL | 3 refills | Status: DC

## 2021-01-23 MED ORDER — FUROSEMIDE 40 MG PO TABS: 40.0000 mg | ORAL_TABLET | Freq: Every day | ORAL | 1 refills | Status: DC

## 2021-02-13 DIAGNOSIS — E875 Hyperkalemia: Secondary | ICD-10-CM | POA: Diagnosis not present

## 2021-02-14 LAB — BASIC METABOLIC PANEL
BUN/Creatinine Ratio: 22 (ref 10–24)
BUN: 18 mg/dL (ref 8–27)
CO2: 21 mmol/L (ref 20–29)
Calcium: 9.7 mg/dL (ref 8.6–10.2)
Chloride: 98 mmol/L (ref 96–106)
Creatinine, Ser: 0.83 mg/dL (ref 0.76–1.27)
Glucose: 97 mg/dL (ref 70–99)
Potassium: 4.3 mmol/L (ref 3.5–5.2)
Sodium: 138 mmol/L (ref 134–144)
eGFR: 90 mL/min/{1.73_m2} (ref >59)

## 2021-02-15 ENCOUNTER — Other Ambulatory Visit (HOSPITAL_COMMUNITY): Payer: Self-pay | Admitting: Cardiovascular Disease

## 2021-02-15 DIAGNOSIS — H3322 Serous retinal detachment, left eye: Secondary | ICD-10-CM | POA: Diagnosis not present

## 2021-02-15 DIAGNOSIS — I6523 Occlusion and stenosis of bilateral carotid arteries: Secondary | ICD-10-CM

## 2021-02-15 DIAGNOSIS — H35052 Retinal neovascularization, unspecified, left eye: Secondary | ICD-10-CM | POA: Diagnosis not present

## 2021-02-15 DIAGNOSIS — H43813 Vitreous degeneration, bilateral: Secondary | ICD-10-CM | POA: Diagnosis not present

## 2021-02-15 DIAGNOSIS — H35423 Microcystoid degeneration of retina, bilateral: Secondary | ICD-10-CM | POA: Diagnosis not present

## 2021-03-22 ENCOUNTER — Encounter: Payer: Self-pay | Admitting: Cardiovascular Disease

## 2021-03-22 ENCOUNTER — Ambulatory Visit: Payer: Medicare Other | Admitting: Cardiovascular Disease

## 2021-03-22 ENCOUNTER — Other Ambulatory Visit: Payer: Self-pay

## 2021-03-22 VITALS — BP 162/54 | HR 50 | Ht 75.0 in | Wt 222.6 lb

## 2021-03-22 DIAGNOSIS — I6521 Occlusion and stenosis of right carotid artery: Secondary | ICD-10-CM | POA: Diagnosis not present

## 2021-03-22 DIAGNOSIS — E785 Hyperlipidemia, unspecified: Secondary | ICD-10-CM | POA: Diagnosis not present

## 2021-03-22 DIAGNOSIS — I4811 Longstanding persistent atrial fibrillation: Secondary | ICD-10-CM

## 2021-03-22 DIAGNOSIS — I10 Essential (primary) hypertension: Secondary | ICD-10-CM | POA: Diagnosis not present

## 2021-03-22 DIAGNOSIS — I2510 Atherosclerotic heart disease of native coronary artery without angina pectoris: Secondary | ICD-10-CM

## 2021-03-22 DIAGNOSIS — Z9861 Coronary angioplasty status: Secondary | ICD-10-CM | POA: Diagnosis not present

## 2021-03-22 DIAGNOSIS — R072 Precordial pain: Secondary | ICD-10-CM | POA: Diagnosis not present

## 2021-03-22 DIAGNOSIS — I272 Pulmonary hypertension, unspecified: Secondary | ICD-10-CM | POA: Insufficient documentation

## 2021-03-22 NOTE — Progress Notes (Signed)
03/22/2021 [name]   Dec 15, 1940  [id number]  Primary Physician [name] Orn, MD Primary Cardiologist: [name] Harp MD FACP, [place], [place], [place]  HPI:  [name] is a 81 y.o.  mildly overweight, married Caucasian male father of 2, grandfather of 3 grandchildren who I saw    saw in the office 05/19/2020.[name] He has a history of CAD status post RCA stenting by myself January 11, 1998. He had normal circumflex, LAD and ejection fraction. His other problems include hypertension and chronic atrial fibrillation on Coumadin anticoagulation, rate controlled, as well as erectile dysfunction. He is totally asymptomatic. He did have colon cancer picked up on screening colonoscopy and underwent right colectomy May 29, 2006 followed by 11 cycles of adjuvant Folfox chemotherapy. [name] He is very active exercises 6 days a week doing weight training 3 days a week, and lower extremity training 3 times a week.[name]   He was admitted to the hospital on 01/21/2018 with non-STEMI.  He underwent cardiac catheterization 2 days later by Dr. Saunders Keller revealing left main disease but disease in his LAD and RCA as well.  He underwent CABG by Dr. Cyndia Keller on 01/28/2018 with a LIMA to his LAD, vein to an obtuse marginal branch and to the PDA and PLA sequentially.  He also had left atrial clipping at that time.    He ended up completing cardiac rehab as an outpatient.   Since I saw him in the office 9 months ago he did see [name] [place], NP in the office 12/14/2020 who placed him on a low-dose diuretic and obtain a 2D echocardiogram which was performed 12/20/2020 revealing severe TR with pulmonary hypertension which is new for him.  He is fairly active but does complain of dyspnea on exertion which is a new symptom.  He denies chest pain.     Current Meds  Medication Sig   Alpha Lipoic Acid 200 MG CAPS Take 600 mg by mouth daily.    amLODipine (NORVASC) 2.5 MG tablet Take 1 tablet (2.5 mg total) by mouth daily.   aspirin 81  MG tablet Take 81 mg by mouth daily.   atorvastatin (LIPITOR) 80 MG tablet Take 1 tablet (80 mg total) by mouth daily. Patient needs appointment for any future refills. Please call [phone] to schedule appointment.  2nd attempt.   cholecalciferol (VITAMIN D) 1000 UNITS tablet Take 1,000 Units by mouth daily.   Coenzyme Q10 200 MG capsule Take 200 mg by mouth daily.    Cyanocobalamin (VITAMIN B 12 PO) Take 100 mg by mouth daily.    ELIQUIS 5 MG TABS tablet TAKE 1 TABLET BY MOUTH  TWICE DAILY   Flaxseed, Linseed, (FLAX SEED OIL PO) Take 1,400 mg by mouth daily.   furosemide (LASIX) 40 MG tablet Take 1 tablet (40 mg total) by mouth daily.   magnesium oxide (MAG-OX) 400 MG tablet Take 400 mg by mouth daily.   Omega-3 Fatty Acids (FISH OIL) 1200 MG CAPS Take 1,000 mg by mouth daily.   pyridoxine (B-6) 200 MG tablet Take 200 mg by mouth daily.   vitamin C (ASCORBIC ACID) 500 MG tablet Take 500 mg by mouth 3 (three) times daily.      No Known Allergies  Social History   Socioeconomic History   Marital status: Married    Spouse name: [name]   Number of children: 2   Years of education: Not on file   Highest education level: Bachelor's degree (e.g., BA, AB, BS)  Occupational History   Occupation: retired    Fish farm manager: RETIRED  Tobacco Use   Smoking status: Former    Years: 30.00    Types: Cigarettes    Quit date: 11/25/1993    Years since quitting: 27.3   Smokeless tobacco: Never  Vaping Use   Vaping Use: Never used  Substance and Sexual Activity   Alcohol use: Yes    Alcohol/week: 1.0 standard drink    Types: 1 Cans of beer per week   Drug use: No   Sexual activity: Not on file  Other Topics Concern   Not on file  Social History Narrative   Not on file   Social Determinants of Health   Financial Resource Strain: Not on file  Food Insecurity: Not on file  Transportation Needs: Not on file  Physical Activity: Not on file  Stress: Not on file  Social Connections: Not on  file  Intimate Partner Violence: Not on file     Review of Systems: General: negative for chills, fever, night sweats or weight changes.  Cardiovascular: negative for chest pain, dyspnea on exertion, edema, orthopnea, palpitations, paroxysmal nocturnal dyspnea or shortness of breath Dermatological: negative for rash Respiratory: negative for cough or wheezing Urologic: negative for hematuria Abdominal: negative for nausea, vomiting, diarrhea, bright red blood per rectum, melena, or hematemesis Neurologic: negative for visual changes, syncope, or dizziness All other systems reviewed and are otherwise negative except as noted above.    Blood pressure (!) 162/54, pulse (!) 50, height 6\' 3"  (1.905 m), weight 222 lb 9.6 oz (101 kg), SpO2 97 %.  General appearance: alert and no distress Neck: no adenopathy, no carotid bruit, no JVD, supple, symmetrical, trachea midline, and thyroid not enlarged, symmetric, no tenderness/mass/nodules Lungs: clear to auscultation bilaterally Heart: irregularly irregular rhythm Extremities: extremities normal, atraumatic, no cyanosis or edema Pulses: 2+ and symmetric Skin: Skin color, texture, turgor normal. No rashes or lesions Neurologic: Grossly normal  EKG atrial fibrillation, right bundle branch block/left intrafascicular block (bifascicular block) with a slow ventricular response of 50.  I personally reviewed this EKG.  ASSESSMENT AND PLAN:   Essential hypertension History of essential hypertension blood pressure measured today at 162/54.[name]  He is on amlodipine.  Atrial fibrillation ([place]) History of persistent A. fib on Eliquis oral anticoagulation rate controlled.  Carotid stenosis, asymptomatic History of moderate right ICA stenosis by duplex ultrasound last checked 12/29/2020.  We will recheck in 1 year.  CAD S/P percutaneous coronary angioplasty By my self 01/11/1998.  That time he had normal circumflex and LAD.  He underwent cardiac  catheterization by Dr. Saunders Keller because of a non-STEMI 01/21/2018 revealing left main disease.  He underwent CABG by Dr. Cyndia Keller 01/28/2018 with a LIMA to his LAD, vein to the obtuse marginal branch and to the PDA and PLA sequentially.  He did well after that.  He denies chest pain.  History of CAD status post RCA PCI stenting  Dyslipidemia, goal LDL below 70 History of dyslipidemia on statin therapy with lipid profile performed 01/06/2021 revealing total cholesterol 110, LDL 45 and HDL 49.  Pulmonary hypertension, unspecified ([place]) Recent 2D echo performed because of shortness of breath 12/20/2020 revealed severe TR with a right ventricular systolic pressure of 68 mmHg.  He was begun on low-dose furosemide which resulted in significant improvement in his lower extremity edema.  His last 2D echo performed 01/22/2018 revealed a PA pressure of 38.  It is not clear to me why he now has TR and pulmonary  hypertension.  He has been on oral anticoagulation for his persistent A. fib.  I am going to get a coronary CTA just to rule out chronic pulmonary thromboembolic disease and then plan to refer him to heart failure clinic for work-up.     [name] Harp MD FACP,FACC,FAHA, [facility] 03/22/2021 3:57 PM

## 2021-03-22 NOTE — Assessment & Plan Note (Signed)
History of moderate right ICA stenosis by duplex ultrasound last checked 12/29/2020.  We will recheck in 1 year.

## 2021-03-22 NOTE — Assessment & Plan Note (Signed)
By my self 01/11/1998.  That time he had normal circumflex and LAD.  He underwent cardiac catheterization by Dr. Saunders Revel because of a non-STEMI 01/21/2018 revealing left main disease.  He underwent CABG by Dr. Cyndia Bent 01/28/2018 with a LIMA to his LAD, vein to the obtuse marginal branch and to the PDA and PLA sequentially.  He did well after that.  He denies chest pain.  History of CAD status post RCA PCI stenting

## 2021-03-22 NOTE — Assessment & Plan Note (Signed)
History of essential hypertension blood pressure measured today at 162/54.[name]  He is on amlodipine.

## 2021-03-22 NOTE — Assessment & Plan Note (Signed)
History of persistent A. fib on Eliquis oral anticoagulation rate controlled.

## 2021-03-22 NOTE — Patient Instructions (Signed)
Medication Instructions:  Your physician recommends that you continue on your current medications as directed. Please refer to the Current Medication list given to you today.  *If you need a refill on your cardiac medications before your next appointment, please call your pharmacy*   Lab Work: Your physician recommends that you have labs drawn today: BMET  If you have labs (blood work) drawn today and your tests are completely normal, you will receive your results only by: [place] (if you have MyChart) OR A paper copy in the mail If you have any lab test that is abnormal or we need to change your treatment, we will call you to review the results.   Testing/Procedures: Your physician has requested that you have cardiac CT. Cardiac computed tomography (CT) is a painless test that uses an x-ray machine to take clear, detailed pictures of your heart. For further information please visit [email or web address]. Please follow instruction sheet as given. This procedure will be done at [address] physician has requested that you have a lower extremity arterial duplex. This test is an ultrasound of the arteries in the legs. It looks at arterial blood flow in the legs. Allow one hour for Lower Arterial scans. There are no restrictions or special instructions  Your physician has requested that you have an ankle brachial index (ABI). During this test an ultrasound and blood pressure cuff are used to evaluate the arteries that supply the arms and legs with blood. Allow thirty minutes for this exam. There are no restrictions or special instructions. These procedures will be done at [place].    Follow-Up: At [facility], you and your health needs are our priority.  As part of our continuing mission to provide you with exceptional heart care, we have created designated Provider Care Teams.  These Care Teams include your primary Cardiologist (physician) and Advanced Practice  Providers (APPs -  Physician Assistants and Nurse Practitioners) who all work together to provide you with the care you need, when you need it.  We recommend signing up for the patient portal called "MyChart".  Sign up information is provided on this After Visit Summary.  MyChart is used to connect with patients for Virtual Visits (Telemedicine).  Patients are able to view lab/test results, encounter notes, upcoming appointments, etc.  Non-urgent messages can be sent to your provider as well.   To learn more about what you can do with MyChart, go to [email or web address].    Your next appointment:   3 month(s)  The format for your next appointment:   In Person  Provider:   [name], MD

## 2021-03-22 NOTE — Assessment & Plan Note (Signed)
History of dyslipidemia on statin therapy with lipid profile performed 01/06/2021 revealing total cholesterol 110, LDL 45 and HDL 49. 

## 2021-03-22 NOTE — Assessment & Plan Note (Signed)
Recent 2D echo performed because of shortness of breath 12/20/2020 revealed severe TR with a right ventricular systolic pressure of 68 mmHg.  He was begun on low-dose furosemide which resulted in significant improvement in his lower extremity edema.  His last 2D echo performed 01/22/2018 revealed a PA pressure of 38.  It is not clear to me why he now has TR and pulmonary hypertension.  He has been on oral anticoagulation for his persistent A. fib.  I am going to get a coronary CTA just to rule out chronic pulmonary thromboembolic disease and then plan to refer him to heart failure clinic for work-up.

## 2021-03-23 LAB — BASIC METABOLIC PANEL
BUN/Creatinine Ratio: 21 (ref 10–24)
BUN: 16 mg/dL (ref 8–27)
CO2: 24 mmol/L (ref 20–29)
Calcium: 9.6 mg/dL (ref 8.6–10.2)
Chloride: 98 mmol/L (ref 96–106)
Creatinine, Ser: 0.75 mg/dL — ABNORMAL LOW (ref 0.76–1.27)
Glucose: 91 mg/dL (ref 70–99)
Potassium: 4.6 mmol/L (ref 3.5–5.2)
Sodium: 140 mmol/L (ref 134–144)
eGFR: 92 mL/min/{1.73_m2} (ref >59)

## 2021-03-23 NOTE — Addendum Note (Signed)
Addended by: Orma Render on: 03/23/2021 04:58 PM   Modules accepted: Orders

## 2021-03-25 ENCOUNTER — Other Ambulatory Visit: Payer: Self-pay | Admitting: Cardiovascular Disease

## 2021-03-25 DIAGNOSIS — I4821 Permanent atrial fibrillation: Secondary | ICD-10-CM

## 2021-03-27 NOTE — Telephone Encounter (Signed)
Prescription refill request for Eliquis received. Indication:Afib Last office visit:1/23 Scr:0.7 Age: 81 Weight:101 kg    Prescription refilled

## 2021-04-03 ENCOUNTER — Ambulatory Visit (HOSPITAL_COMMUNITY)
Admission: RE | Admit: 2021-04-03 | Discharge: 2021-04-03 | Disposition: A | Discharge status: Home / Self Care | Payer: Medicare Other | Source: Ambulatory Visit | Attending: Cardiology | Admitting: Cardiology

## 2021-04-03 ENCOUNTER — Other Ambulatory Visit: Payer: Self-pay | Admitting: Cardiovascular Disease

## 2021-04-03 ENCOUNTER — Other Ambulatory Visit: Payer: Self-pay

## 2021-04-03 DIAGNOSIS — I739 Peripheral vascular disease, unspecified: Secondary | ICD-10-CM | POA: Insufficient documentation

## 2021-04-03 DIAGNOSIS — I2510 Atherosclerotic heart disease of native coronary artery without angina pectoris: Secondary | ICD-10-CM | POA: Diagnosis not present

## 2021-04-03 DIAGNOSIS — Z9861 Coronary angioplasty status: Secondary | ICD-10-CM | POA: Diagnosis not present

## 2021-04-03 DIAGNOSIS — E785 Hyperlipidemia, unspecified: Secondary | ICD-10-CM | POA: Insufficient documentation

## 2021-04-05 ENCOUNTER — Ambulatory Visit (INDEPENDENT_AMBULATORY_CARE_PROVIDER_SITE_OTHER)
Admission: RE | Admit: 2021-04-05 | Discharge: 2021-04-05 | Disposition: A | Discharge status: Home / Self Care | Payer: Medicare Other | Source: Ambulatory Visit | Attending: Cardiovascular Disease | Admitting: Cardiovascular Disease

## 2021-04-05 ENCOUNTER — Other Ambulatory Visit: Payer: Self-pay

## 2021-04-05 DIAGNOSIS — R072 Precordial pain: Secondary | ICD-10-CM | POA: Diagnosis not present

## 2021-04-05 DIAGNOSIS — J439 Emphysema, unspecified: Secondary | ICD-10-CM | POA: Diagnosis not present

## 2021-04-05 DIAGNOSIS — R079 Chest pain, unspecified: Secondary | ICD-10-CM | POA: Diagnosis not present

## 2021-04-05 DIAGNOSIS — R0602 Shortness of breath: Secondary | ICD-10-CM | POA: Diagnosis not present

## 2021-04-05 MED ORDER — IOHEXOL 350 MG/ML SOLN
100.0000 mL | Freq: Once | INTRAVENOUS | Status: AC | PRN
  Administered 2021-04-05: 100 mL via INTRAVENOUS

## 2021-04-19 DIAGNOSIS — H35363 Drusen (degenerative) of macula, bilateral: Secondary | ICD-10-CM | POA: Diagnosis not present

## 2021-04-19 DIAGNOSIS — H3322 Serous retinal detachment, left eye: Secondary | ICD-10-CM | POA: Diagnosis not present

## 2021-04-19 DIAGNOSIS — H43813 Vitreous degeneration, bilateral: Secondary | ICD-10-CM | POA: Diagnosis not present

## 2021-04-19 DIAGNOSIS — H35052 Retinal neovascularization, unspecified, left eye: Secondary | ICD-10-CM | POA: Diagnosis not present

## 2021-04-25 ENCOUNTER — Other Ambulatory Visit: Payer: Self-pay | Admitting: Cardiovascular Disease

## 2021-04-25 ENCOUNTER — Other Ambulatory Visit: Payer: Self-pay

## 2021-04-25 ENCOUNTER — Encounter: Payer: Self-pay | Admitting: Cardiovascular Disease

## 2021-04-25 ENCOUNTER — Ambulatory Visit: Payer: Medicare Other | Admitting: Cardiovascular Disease

## 2021-04-25 DIAGNOSIS — I739 Peripheral vascular disease, unspecified: Secondary | ICD-10-CM

## 2021-04-25 DIAGNOSIS — I272 Pulmonary hypertension, unspecified: Secondary | ICD-10-CM | POA: Diagnosis not present

## 2021-04-25 MED ORDER — FUROSEMIDE 40 MG PO TABS: ORAL_TABLET | ORAL | 3 refills | Status: DC

## 2021-04-25 NOTE — Progress Notes (Signed)
04/25/2021 TERRACE FONTANILLA   [birth year]  [id number]  Primary Physician Lavone Orn, MD Primary Cardiologist: Lorretta Harp MD FACP, [place], [place], [place]  HPI:  [name] is a 81 y.o.  mildly overweight, married Caucasian male father of 2, grandfather of 3 grandchildren who I saw  in the office 03/22/2021.[name] He has a history of CAD status post RCA stenting by myself January 11, 1998. He had normal circumflex, LAD and ejection fraction. His other problems include hypertension and chronic atrial fibrillation on Coumadin anticoagulation, rate controlled, as well as erectile dysfunction. He is totally asymptomatic. He did have colon cancer picked up on screening colonoscopy and underwent right colectomy May 29, 2006 followed by 11 cycles of adjuvant Folfox chemotherapy. [name] He is very active exercises 6 days a week doing weight training 3 days a week, and lower extremity training 3 times a week.[name]   He was admitted to the hospital on 01/21/2018 with non-STEMI.  He underwent cardiac catheterization 2 days later by Dr. Saunders Revel revealing left main disease but disease in his LAD and RCA as well.  He underwent CABG by Dr. Cyndia Bent on 01/28/2018 with a LIMA to his LAD, vein to an obtuse marginal branch and to the PDA and PLA sequentially.  He also had left atrial clipping at that time.    He ended up completing cardiac rehab as an outpatient.   He saw [name] Montana, NP in the office 12/14/2020 who placed him on a low-dose diuretic and obtain a 2D echocardiogram which was performed 12/20/2020 revealing severe TR with pulmonary hypertension which is new for him.  He is fairly active but does complain of dyspnea on exertion which is a new symptom.    His symptoms of dyspnea significantly improved with the addition of a diuretic.  I did go chest CTA that showed no evidence of thromboembolic disease.  In addition, I did lower extremity arterial Doppler studies that show significant iliac disease bilaterally.  He  does complain of lifestyle limiting claudication.   Current Meds  Medication Sig   Alpha Lipoic Acid 200 MG CAPS Take 600 mg by mouth daily.    amLODipine (NORVASC) 2.5 MG tablet Take 1 tablet (2.5 mg total) by mouth daily.   aspirin 81 MG tablet Take 81 mg by mouth daily.   atorvastatin (LIPITOR) 80 MG tablet TAKE 1 TABLET AT 6PM.   cholecalciferol (VITAMIN D) 1000 UNITS tablet Take 1,000 Units by mouth daily.   Coenzyme Q10 200 MG capsule Take 200 mg by mouth daily.    Cyanocobalamin (VITAMIN B 12 PO) Take 100 mg by mouth daily.    ELIQUIS 5 MG TABS tablet TAKE 1 TABLET BY MOUTH TWICE DAILY.   Flaxseed, Linseed, (FLAX SEED OIL PO) Take 1,400 mg by mouth daily.   furosemide (LASIX) 40 MG tablet Take 1 tablet (40 mg total) by mouth daily.   magnesium oxide (MAG-OX) 400 MG tablet Take 400 mg by mouth daily.   Omega-3 Fatty Acids (FISH OIL) 1200 MG CAPS Take 1,000 mg by mouth daily.   pyridoxine (B-6) 200 MG tablet Take 200 mg by mouth daily.   vitamin C (ASCORBIC ACID) 500 MG tablet Take 500 mg by mouth 3 (three) times daily.      No Known Allergies  Social History   Socioeconomic History   Marital status: Married    Spouse name: [name]   Number of children: 2   Years of education: Not on file  Highest education level: Bachelor's degree (e.g., BA, AB, BS)  Occupational History   Occupation: retired    Fish farm manager: RETIRED  Tobacco Use   Smoking status: Former    Years: 30.00    Types: Cigarettes    Quit date: 11/25/1993    Years since quitting: 27.4   Smokeless tobacco: Never  Vaping Use   Vaping Use: Never used  Substance and Sexual Activity   Alcohol use: Yes    Alcohol/week: 1.0 standard drink    Types: 1 Cans of beer per week   Drug use: No   Sexual activity: Not on file  Other Topics Concern   Not on file  Social History Narrative   Not on file   Social Determinants of Health   Financial Resource Strain: Not on file  Food Insecurity: Not on file   Transportation Needs: Not on file  Physical Activity: Not on file  Stress: Not on file  Social Connections: Not on file  Intimate Partner Violence: Not on file     Review of Systems: General: negative for chills, fever, night sweats or weight changes.  Cardiovascular: negative for chest pain, dyspnea on exertion, edema, orthopnea, palpitations, paroxysmal nocturnal dyspnea or shortness of breath Dermatological: negative for rash Respiratory: negative for cough or wheezing Urologic: negative for hematuria Abdominal: negative for nausea, vomiting, diarrhea, bright red blood per rectum, melena, or hematemesis Neurologic: negative for visual changes, syncope, or dizziness All other systems reviewed and are otherwise negative except as noted above.    Blood pressure (!) 145/76, pulse (!) 45, height 6\' 3"  (1.905 m), weight 220 lb (99.8 kg), SpO2 97 %.  General appearance: alert and no distress Neck: no adenopathy, no carotid bruit, no JVD, supple, symmetrical, trachea midline, and thyroid not enlarged, symmetric, no tenderness/mass/nodules Lungs: clear to auscultation bilaterally Heart: regular rate and rhythm, S1, S2 normal, no murmur, click, rub or gallop Extremities: extremities normal, atraumatic, no cyanosis or edema Pulses: 2+ and symmetric Skin: Skin color, texture, turgor normal. 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  I personally reviewed this EKG.  ASSESSMENT AND PLAN:   Peripheral arterial disease [facility]) [name] returns today for follow-up of his Doppler studies which were performed 04/03/2021.  His ABIs were noncompressible.  He had high-grade right iliac disease and proximal SFA disease and moderate left iliac disease.  He does complain of lifestyle limiting claudication.  We talked about peripheral angiography and endovascular  therapy which she wishes to pursue however because of personal reasons he wishes to delay this for a short period time.  Pulmonary hypertension, unspecified ([place]) Recent echo that showed severe TR with pulmonary hypertension.  He is on Eliquis for A-fib.  His LV function is brisk.  I did get a CTA that showed no evidence of thromboembolic disease but did show a large right heart.  He feels somewhat better on diuretics which I am going to titrate.  I am going to refer him to Dr. Haroldine Laws for further evaluation of his pulm hypertension and tricuspid regurgitation.     Lorretta Harp MD FACP,FACC,FAHA, [facility] 04/25/2021 2:36 PM

## 2021-04-25 NOTE — Patient Instructions (Signed)
Medication Instructions:   -Start taking furosemide (lasix) 40mg  in the morning and 20mg  in the evening.  *If you need a refill on your cardiac medications before your next appointment, please call your pharmacy*   Lab Work: Your physician recommends that you return for lab work in: 10-14 days for BMET  If you have labs (blood work) drawn today and your tests are completely normal, you will receive your results only by: [place] (if you have MyChart) OR A paper copy in the mail If you have any lab test that is abnormal or we need to change your treatment, we will call you to review the results.    Follow-Up: At [facility], you and your health needs are our priority.  As part of our continuing mission to provide you with exceptional heart care, we have created designated Provider Care Teams.  These Care Teams include your primary Cardiologist (physician) and Advanced Practice Providers (APPs -  Physician Assistants and Nurse Practitioners) who all work together to provide you with the care you need, when you need it.  We recommend signing up for the patient portal called "MyChart".  Sign up information is provided on this After Visit Summary.  MyChart is used to connect with patients for Virtual Visits (Telemedicine).  Patients are able to view lab/test results, encounter notes, upcoming appointments, etc.  Non-urgent messages can be sent to your provider as well.   To learn more about what you can do with MyChart, go to [email or web address].    Your next appointment:   3 month(s)  The format for your next appointment:   In Person  Provider:   [name], MD

## 2021-04-25 NOTE — Assessment & Plan Note (Signed)
Recent echo that showed severe TR with pulmonary hypertension.  He is on Eliquis for A-fib.  His LV function is brisk.  I did get a CTA that showed no evidence of thromboembolic disease but did show a large right heart.  He feels somewhat better on diuretics which I am going to titrate.  I am going to refer him to Dr. Haroldine Laws for further evaluation of his pulm hypertension and tricuspid regurgitation.

## 2021-04-25 NOTE — Assessment & Plan Note (Signed)
Mr. Engelhard returns today for follow-up of his Doppler studies which were performed 04/03/2021.  His ABIs were noncompressible.  He had high-grade right iliac disease and proximal SFA disease and moderate left iliac disease.  He does complain of lifestyle limiting claudication.  We talked about peripheral angiography and endovascular therapy which she wishes to pursue however because of personal reasons he wishes to delay this for a short period time.

## 2021-04-27 DIAGNOSIS — F5109 Other insomnia not due to a substance or known physiological condition: Secondary | ICD-10-CM | POA: Diagnosis not present

## 2021-05-09 DIAGNOSIS — I739 Peripheral vascular disease, unspecified: Secondary | ICD-10-CM | POA: Diagnosis not present

## 2021-05-09 DIAGNOSIS — I272 Pulmonary hypertension, unspecified: Secondary | ICD-10-CM | POA: Diagnosis not present

## 2021-05-10 LAB — BASIC METABOLIC PANEL
BUN/Creatinine Ratio: 22 (ref 10–24)
BUN: 17 mg/dL (ref 8–27)
CO2: 23 mmol/L (ref 20–29)
Calcium: 9.4 mg/dL (ref 8.6–10.2)
Chloride: 101 mmol/L (ref 96–106)
Creatinine, Ser: 0.78 mg/dL (ref 0.76–1.27)
Glucose: 100 mg/dL — ABNORMAL HIGH (ref 70–99)
Potassium: 4.2 mmol/L (ref 3.5–5.2)
Sodium: 140 mmol/L (ref 134–144)
eGFR: 91 mL/min/{1.73_m2} (ref >59)

## 2021-05-31 ENCOUNTER — Other Ambulatory Visit (HOSPITAL_COMMUNITY): Payer: Self-pay | Admitting: *Deleted

## 2021-05-31 ENCOUNTER — Encounter (HOSPITAL_COMMUNITY): Payer: Self-pay | Admitting: Internal Medicine

## 2021-05-31 ENCOUNTER — Ambulatory Visit (HOSPITAL_COMMUNITY)
Admission: RE | Admit: 2021-05-31 | Discharge: 2021-05-31 | Disposition: A | Discharge status: Home / Self Care | Payer: Medicare Other | Source: Ambulatory Visit | Attending: Internal Medicine | Admitting: Internal Medicine

## 2021-05-31 ENCOUNTER — Other Ambulatory Visit: Payer: Self-pay

## 2021-05-31 VITALS — BP 150/80 | HR 50 | Wt 233.2 lb

## 2021-05-31 DIAGNOSIS — G4719 Other hypersomnia: Secondary | ICD-10-CM | POA: Diagnosis not present

## 2021-05-31 DIAGNOSIS — Z955 Presence of coronary angioplasty implant and graft: Secondary | ICD-10-CM | POA: Diagnosis not present

## 2021-05-31 DIAGNOSIS — Z951 Presence of aortocoronary bypass graft: Secondary | ICD-10-CM | POA: Insufficient documentation

## 2021-05-31 DIAGNOSIS — I10 Essential (primary) hypertension: Secondary | ICD-10-CM | POA: Diagnosis not present

## 2021-05-31 DIAGNOSIS — E669 Obesity, unspecified: Secondary | ICD-10-CM | POA: Diagnosis not present

## 2021-05-31 DIAGNOSIS — Z79899 Other long term (current) drug therapy: Secondary | ICD-10-CM | POA: Insufficient documentation

## 2021-05-31 DIAGNOSIS — I2510 Atherosclerotic heart disease of native coronary artery without angina pectoris: Secondary | ICD-10-CM | POA: Diagnosis not present

## 2021-05-31 DIAGNOSIS — R0602 Shortness of breath: Secondary | ICD-10-CM | POA: Diagnosis not present

## 2021-05-31 DIAGNOSIS — Z7982 Long term (current) use of aspirin: Secondary | ICD-10-CM | POA: Insufficient documentation

## 2021-05-31 DIAGNOSIS — Z7901 Long term (current) use of anticoagulants: Secondary | ICD-10-CM | POA: Diagnosis not present

## 2021-05-31 DIAGNOSIS — R0600 Dyspnea, unspecified: Secondary | ICD-10-CM | POA: Diagnosis not present

## 2021-05-31 DIAGNOSIS — I272 Pulmonary hypertension, unspecified: Secondary | ICD-10-CM | POA: Insufficient documentation

## 2021-05-31 DIAGNOSIS — I482 Chronic atrial fibrillation, unspecified: Secondary | ICD-10-CM | POA: Insufficient documentation

## 2021-05-31 DIAGNOSIS — Z87891 Personal history of nicotine dependence: Secondary | ICD-10-CM | POA: Insufficient documentation

## 2021-05-31 DIAGNOSIS — R600 Localized edema: Secondary | ICD-10-CM | POA: Diagnosis not present

## 2021-05-31 LAB — COMPREHENSIVE METABOLIC PANEL
ALT: 38 U/L (ref 0–44)
AST: 43 U/L — ABNORMAL HIGH (ref 15–41)
Albumin: 4.3 g/dL (ref 3.5–5.0)
Alkaline Phosphatase: 98 U/L (ref 38–126)
Anion gap: 14 (ref 5–15)
BUN: 19 mg/dL (ref 8–23)
CO2: 25 mmol/L (ref 22–32)
Calcium: 9.6 mg/dL (ref 8.9–10.3)
Chloride: 99 mmol/L (ref 98–111)
Creatinine, Ser: 0.73 mg/dL (ref 0.61–1.24)
GFR, Estimated: >60 mL/min (ref >60)
Glucose, Bld: 108 mg/dL — ABNORMAL HIGH (ref 70–99)
Potassium: 5.1 mmol/L (ref 3.5–5.1)
Sodium: 138 mmol/L (ref 135–145)
Total Bilirubin: 2.5 mg/dL — ABNORMAL HIGH (ref 0.3–1.2)
Total Protein: 7.1 g/dL (ref 6.5–8.1)

## 2021-05-31 LAB — TSH: TSH: 4.317 u[IU]/mL (ref 0.350–4.500)

## 2021-05-31 LAB — CBC
HCT: 39.9 % (ref 39.0–52.0)
Hemoglobin: 13.1 g/dL (ref 13.0–17.0)
MCH: 31.8 pg (ref 26.0–34.0)
MCHC: 32.8 g/dL (ref 30.0–36.0)
MCV: 96.8 fL (ref 80.0–100.0)
Platelets: 150 10*3/uL (ref 150–400)
RBC: 4.12 MIL/uL — ABNORMAL LOW (ref 4.22–5.81)
RDW: 13.6 % (ref 11.5–15.5)
WBC: 8 10*3/uL (ref 4.0–10.5)
nRBC: 0 % (ref 0.0–0.2)

## 2021-05-31 NOTE — Progress Notes (Signed)
Pt ambulated in hall as ordered on room air.Oxygen saturation started @ 96%and decreased to 94%. Pt ambulated 91.44 meters. ?

## 2021-05-31 NOTE — H&P (View-Only) (Signed)
? ?ADVANCED HF CLINIC CONSULT NOTE ? ?Referring Physician:Jonathan Gwenlyn Found, MD ? ?Primary Care: Lavone Orn, MD ?Primary Cardiologist: [name], MD ? ? ?HPI: ? ?[name] is a 81 y.o. [place] male with obesity, HTN, chronic AF, CAD s/p RCA stenting in 1999 and CABG 11/19, PAD, colonCA s/p right colectomy 3/08 followed by 11 cycles of adjuvant Folfox chemotherapy. Referred by Dr. 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 ? ?Quit smoking 25 years ago  ? ?Echo 10/22 LVEF >65% RV dilated with normal function. Severe central TR RVSP 28mHG (previously 38)  ? ?TEE at time of CABG 11/19 Normal RV. No TR ? ?CTA chest no PE. + evidence of RV strain   ? ?Says he has always been active but over past 3 months has slowed down. Previously went to gym 5x/week. Now gets SOB taking trash can to the curb. Used to snore but stopped when he quit smoking. No orthopnea or PND. + LE edema. Takes lasix '20mg'$  am and '10mg'$  in evening. No ho DVT or PE. No h/o CTD. No angina. Takes eliquis '5mg'$  bid. No bleeding. Drinks 2-4 beers or wine per day.  ? ? ? ?Review of Systems: [y] = yes, '[ ]'$  = no  ? ?General: Weight gain '[ ]'$ ; Weight loss '[ ]'$ ; Anorexia '[ ]'$ ; Fatigue [ y]; Fever '[ ]'$ ; Chills '[ ]'$ ; Weakness '[ ]'$   ?Cardiac: Chest pain/pressure '[ ]'$ ; Resting SOB '[ ]'$ ; Exertional SOB [Blue.Reese]; Orthopnea '[ ]'$ ; Pedal Edema [ y]; Palpitations '[ ]'$ ; Syncope '[ ]'$ ; Presyncope '[ ]'$ ; Paroxysmal nocturnal dyspnea'[ ]'$   ?Pulmonary: Cough '[ ]'$ ; Wheezing'[ ]'$ ; Hemoptysis'[ ]'$ ; Sputum '[ ]'$ ; Snoring '[ ]'$   ?GI: Vomiting'[ ]'$ ; Dysphagia'[ ]'$ ; Melena'[ ]'$ ; Hematochezia '[ ]'$ ; Heartburn'[ ]'$ ; Abdominal pain '[ ]'$ ; Constipation '[ ]'$ ; Diarrhea '[ ]'$ ; BRBPR '[ ]'$   ?GU: Hematuria'[ ]'$ ; Dysuria '[ ]'$ ; Nocturia'[ ]'$   ?Vascular: Pain in legs with walking '[ ]'$ ; Pain in feet with lying flat '[ ]'$ ; Non-healing sores '[ ]'$ ; Stroke '[ ]'$ ; TIA '[ ]'$ ; Slurred speech '[ ]'$ ;  ?Neuro: Headaches'[ ]'$ ; Vertigo'[ ]'$ ; Seizures'[ ]'$ ; Paresthesias'[ ]'$ ;Blurred vision '[ ]'$ ; Diplopia '[ ]'$ ; Vision changes [  ]  ?Ortho/Skin: Arthritis [Blue.Reese]; Joint pain [ y]; Muscle pain '[ ]'$ ; Joint swelling '[ ]'$ ; Back Pain '[ ]'$ ; Rash '[ ]'$   ?Psych: Depression'[ ]'$ ; Anxiety'[ ]'$   ?Heme: Bleeding problems '[ ]'$ ; Clotting disorders '[ ]'$ ; Anemia '[ ]'$   ?Endocrine: Diabetes '[ ]'$ ; Thyroid dysfunction'[ ]'$  ? ? ?Past Medical History:  ?Diagnosis Date  ? [place]-fib ([place]   ? Allergy   ? Seasonal--pollen  ? Anticoagulation goal of INR 2 to 3   ? on coumadin  ? Arrhythmia   ? CAD (coronary artery disease) 1999  ? RCA stent  ? Carotid stenosis, asymptomatic   ? moderate RT and mild LT with carotid dopplers 10/26/11  ? [place] 04[place]  ? Stage III--T3 N1  ? COPD (chronic obstructive pulmonary disease) ([place]   ? H/O cardiovascular stress test 01/13/2010  ? low risk scan, similar to 2008 study  ? H/O echocardiogram 05/23/2009  ? EF >55%, mild MR, Mild TR,   ? Hand foot syndrome   ? Hemorrhoids   ? Hyperlipidemia   ? Hypertension   ? Neuropathy   ? Permanent atrial fibrillation ([place]   ? Psoriasis   ? Tubular adenoma of colon   ? ? ?Current Outpatient Medications  ?Medication Sig Dispense Refill  ?  Alpha Lipoic Acid 200 MG CAPS Take 600 mg by mouth daily.     ? amLODipine (NORVASC) 2.5 MG tablet Take 1 tablet (2.5 mg total) by mouth daily. 90 tablet 3  ? aspirin 81 MG tablet Take 81 mg by mouth daily.    ? atorvastatin (LIPITOR) 80 MG tablet TAKE 1 TABLET AT 6PM. 90 tablet 2  ? cholecalciferol (VITAMIN D) 1000 UNITS tablet Take 1,000 Units by mouth daily.    ? Coenzyme Q10 200 MG capsule Take 200 mg by mouth daily.     ? Cyanocobalamin (VITAMIN B 12 PO) Take 100 mg by mouth daily.     ? ELIQUIS 5 MG TABS tablet TAKE 1 TABLET BY MOUTH TWICE DAILY. 180 tablet 1  ? Flaxseed, Linseed, (FLAX SEED OIL PO) Take 1,400 mg by mouth daily.    ? furosemide (LASIX) 40 MG tablet Take 1 tablet (40 mg total) by mouth in the morning AND 0.5 tablets (20 mg total) every evening. 135 tablet 3  ? magnesium oxide (MAG-OX) 400 MG tablet Take 800 mg by mouth daily.    ? Omega-3 Fatty Acids  (FISH OIL) 1200 MG CAPS Take 1,000 mg by mouth daily.    ? pyridoxine (B-6) 200 MG tablet Take 200 mg by mouth daily.    ? vitamin C (ASCORBIC ACID) 500 MG tablet Take 500 mg by mouth 3 (three) times daily.     ? ?No current facility-administered medications for this encounter.  ? ? ?No Known Allergies ? ?  ?Social History  ? ?Socioeconomic History  ? Marital status: Married  ?  Spouse name: [name]  ? Number of children: 2  ? Years of education: Not on file  ? Highest education level: Bachelor's degree (e.g., BA, AB, BS)  ?Occupational History  ? Occupation: retired  ?  Employer: RETIRED  ?Tobacco Use  ? Smoking status: Former  ?  Years: 30.00  ?  Types: Cigarettes  ?  Quit date: 11/25/1993  ?  Years since quitting: 27.5  ? Smokeless tobacco: Never  ?Vaping Use  ? Vaping Use: Never used  ?Substance and Sexual Activity  ? Alcohol use: Yes  ?  Alcohol/week: 1.0 standard drink  ?  Types: 1 Cans of beer per week  ? Drug use: No  ? Sexual activity: Not on file  ?Other Topics Concern  ? Not on file  ?Social History Narrative  ? Not on file  ? ?Social Determinants of Health  ? ?Financial Resource Strain: Not on file  ?Food Insecurity: Not on file  ?Transportation Needs: Not on file  ?Physical Activity: Not on file  ?Stress: Not on file  ?Social Connections: Not on file  ?Intimate Partner Violence: Not on file  ? ? ?  ?Family History  ?Problem Relation Age of Onset  ? Heart disease Father 81  ? Heart disease Paternal Grandmother   ? Heart disease Mother   ? Hypertension Mother   ? Healthy Sister   ? Heart disease Paternal Grandfather   ? Hypertension Paternal Grandfather   ? Colon cancer Neg Hx   ? ? ?Vitals:  ? 05/31/21 1153  ?BP: (!) 150/80  ?Pulse: (!) 50  ?SpO2: 93%  ?Weight: 105.8 kg (233 lb 3.2 oz)  ? ? ?PHYSICAL EXAM: ?General:  Well appearing. No respiratory difficulty ?HEENT: normal ?Neck: supple. JVP 7-8 with prominent v-waves to jaw  Carotids 2+ bilat; no bruits. No lymphadenopathy or thryomegaly  appreciated. ?Cor: PMI nondisplaced. Irregular brad  No rubs,  gallops or murmurs. ?Lungs: clear ?Abdomen: soft, nontender, nondistended. No hepatosplenomegaly. No bruits or masses. Good bowel sounds. ?Extremities: no cyanosis, clubbing, rash, tr edema + varicose veins and chronic venous stasis changes + psoriatic patches ?Neuro: alert & oriented x 3, cranial nerves grossly intact. moves all 4 extremities w/o difficulty. Affect pleasant. ? ?ECG: AF with slow VR 46 narrow QRS Personally reviewed ? ? ? ?ASSESSMENT & PLAN: ? ?1. Pulmonary HTN on echo  ?- Echo 10/22 LVEF >65% RV dilated with normal function. Severe central TR RVSP 67mHG (previously 38)  ?- TEE at time of CABG 11/19 Normal RV. No TR ?- CTA chest 1/23 no PE. + evidence of RV strain   ?- Etiology unclear - suspect mixed picture ?- Proceed with RHC ?- Check PFTs with DLCO and hall walk ?- Check home sleep study ?- VQ scan ?- Serologies  ?- May need cMRI down the road to consider w/u for amyloid  ? ?2. CAD s/p CABG 2019  ?- no s/s angina ?- continue ASA/statin ?- managed by Dr. BGwenlyn Found? ?3. Chronic AF ?- rate slow here but 50-60s at home ?- followed by Dr. BGwenlyn Found ?- Continue Eliquis ? ? ?DGlori Bickers MD  ?12:21 PM ? ?

## 2021-05-31 NOTE — Patient Instructions (Signed)
Labs done today, your results will be available in MyChart, we will contact you for abnormal readings. ? ?Your physician has recommended that you have a pulmonary function test. Pulmonary Function Tests are a group of tests that measure how well air moves in and out of your lungs. ? ?Your physician has recommended that you have a chest x-ray and VQ Scan ? ?Your provider has recommended that you have a home sleep study.  We have provided you with the equipment in our office today. Please download the app and follow the instructions. YOUR PIN NUMBER IS: [id number]. Once you have completed the test you just dispose of the equipment, the information is automatically uploaded to [place] via blue-tooth technology. If your test is positive for sleep apnea and you need a home CPAP machine you will be contacted by Dr Theodosia Blender office [facility]) to set this up. ? ?Heart Catheterization on Wed 06/14/21, see instructions below ? ?If you have any questions or concerns before your next appointment please send [place] a message through [place] or call our office at [phone].   ? ?TO LEAVE A MESSAGE FOR THE NURSE SELECT OPTION 2, PLEASE LEAVE A MESSAGE INCLUDING: ?YOUR NAME ?DATE OF BIRTH ?CALL BACK NUMBER ?REASON FOR CALL**this is important as we prioritize the call backs ? ?YOU WILL RECEIVE A CALL BACK THE SAME DAY AS LONG AS YOU CALL BEFORE 4:00 PM ? ?At the [facility], you and your health needs are our priority. As part of our continuing mission to provide you with exceptional heart care, we have created designated Provider Care Teams. These Care Teams include your primary Cardiologist (physician) and Advanced Practice Providers (APPs- Physician Assistants and Nurse Practitioners) who all work together to provide you with the care you need, when you need it.  ? ?You may see any of the following providers on your designated Care Team at your next follow up: ?Dr Glori Bickers ?Dr Loralie Champagne ?Darrick Grinder,  NP ?Lyda Jester, PA ?Jessica Milford,NP ?Marlyce Huge, PA ?Audry Riles, PharmD ? ? ?Please be sure to bring in all your medications bottles to every appointment.  ? ? ? ?HEART CATHETERIZATION INSTRUCTIONS: ? ?You are scheduled for a Cardiac Catheterization on Wednesday, March 29 with Dr. Glori Bickers. ? ?1. Please arrive at the Main Entrance A at [facility]: [place], [place] [address] at 9:30 AM (This time is two hours before your procedure to ensure your preparation). Free valet parking service is available.  ? ?Special note: Every effort is made to have your procedure done on time. Please understand that emergencies sometimes delay scheduled procedures. ? ?2. Diet: Do not eat solid foods after midnight.  You may have clear liquids until 5 AM upon the day of the procedure. ? ?3. Labs: DONE TODAY ? ?4. Medication instructions in preparation for your procedure: ? ?  Tuesday 3/28 PM DO NOT TAKE ELIQUIS ? Wednesday 3/29 AM DO NOT TAKE ELIQUIS OR FUROSEMIDE ? ?On the morning of your procedure, take any morning medicines NOT listed above.  You may use sips of water. ? ?5. Plan to go home the same day, you will only stay overnight if medically necessary. ?6. You MUST have a responsible adult to drive you home. ?7. An adult MUST be with you the first 24 hours after you arrive home. ?8. Bring a current list of your medications, and the last time and date medication taken. ?9. Bring ID and current insurance cards. ?10.Please wear  clothes that are easy to get on and off and wear slip-on shoes. ? ?Thank you for allowing [place] to care for you! ?  -- [facility] ? ? ?

## 2021-05-31 NOTE — Progress Notes (Signed)
? ?ADVANCED HF CLINIC CONSULT NOTE ? ?Referring Physician:Jonathan Gwenlyn Found, MD ? ?Primary Care: Lavone Orn, MD ?Primary Cardiologist: [name], MD ? ? ?HPI: ? ?[name] is a 81 y.o. [place] male with obesity, HTN, chronic AF, CAD s/p RCA stenting in 1999 and CABG 11/19, PAD, colonCA s/p right colectomy 3/08 followed by 11 cycles of adjuvant Folfox chemotherapy. Referred by Dr. 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 ? ?Quit smoking 25 years ago  ? ?Echo 10/22 LVEF >65% RV dilated with normal function. Severe central TR RVSP 11mHG (previously 38)  ? ?TEE at time of CABG 11/19 Normal RV. No TR ? ?CTA chest no PE. + evidence of RV strain   ? ?Says he has always been active but over past 3 months has slowed down. Previously went to gym 5x/week. Now gets SOB taking trash can to the curb. Used to snore but stopped when he quit smoking. No orthopnea or PND. + LE edema. Takes lasix '20mg'$  am and '10mg'$  in evening. No ho DVT or PE. No h/o CTD. No angina. Takes eliquis '5mg'$  bid. No bleeding. Drinks 2-4 beers or wine per day.  ? ? ? ?Review of Systems: [y] = yes, '[ ]'$  = no  ? ?General: Weight gain '[ ]'$ ; Weight loss '[ ]'$ ; Anorexia '[ ]'$ ; Fatigue [ y]; Fever '[ ]'$ ; Chills '[ ]'$ ; Weakness '[ ]'$   ?Cardiac: Chest pain/pressure '[ ]'$ ; Resting SOB '[ ]'$ ; Exertional SOB [Blue.Reese]; Orthopnea '[ ]'$ ; Pedal Edema [ y]; Palpitations '[ ]'$ ; Syncope '[ ]'$ ; Presyncope '[ ]'$ ; Paroxysmal nocturnal dyspnea'[ ]'$   ?Pulmonary: Cough '[ ]'$ ; Wheezing'[ ]'$ ; Hemoptysis'[ ]'$ ; Sputum '[ ]'$ ; Snoring '[ ]'$   ?GI: Vomiting'[ ]'$ ; Dysphagia'[ ]'$ ; Melena'[ ]'$ ; Hematochezia '[ ]'$ ; Heartburn'[ ]'$ ; Abdominal pain '[ ]'$ ; Constipation '[ ]'$ ; Diarrhea '[ ]'$ ; BRBPR '[ ]'$   ?GU: Hematuria'[ ]'$ ; Dysuria '[ ]'$ ; Nocturia'[ ]'$   ?Vascular: Pain in legs with walking '[ ]'$ ; Pain in feet with lying flat '[ ]'$ ; Non-healing sores '[ ]'$ ; Stroke '[ ]'$ ; TIA '[ ]'$ ; Slurred speech '[ ]'$ ;  ?Neuro: Headaches'[ ]'$ ; Vertigo'[ ]'$ ; Seizures'[ ]'$ ; Paresthesias'[ ]'$ ;Blurred vision '[ ]'$ ; Diplopia '[ ]'$ ; Vision changes [  ]  ?Ortho/Skin: Arthritis [Blue.Reese]; Joint pain [ y]; Muscle pain '[ ]'$ ; Joint swelling '[ ]'$ ; Back Pain '[ ]'$ ; Rash '[ ]'$   ?Psych: Depression'[ ]'$ ; Anxiety'[ ]'$   ?Heme: Bleeding problems '[ ]'$ ; Clotting disorders '[ ]'$ ; Anemia '[ ]'$   ?Endocrine: Diabetes '[ ]'$ ; Thyroid dysfunction'[ ]'$  ? ? ?Past Medical History:  ?Diagnosis Date  ? [place]fib ([place]   ? Allergy   ? Seasonal--pollen  ? Anticoagulation goal of INR 2 to 3   ? on coumadin  ? Arrhythmia   ? CAD (coronary artery disease) 1999  ? RCA stent  ? Carotid stenosis, asymptomatic   ? moderate RT and mild LT with carotid dopplers 10/26/11  ? Colon cancer ([place] 04/2006  ? Stage III--T3 N1  ? COPD (chronic obstructive pulmonary disease) ([place]   ? H/O cardiovascular stress test 01/13/2010  ? low risk scan, similar to 2008 study  ? H/O echocardiogram 05/23/2009  ? EF >55%, mild MR, Mild TR,   ? Hand foot syndrome   ? Hemorrhoids   ? Hyperlipidemia   ? Hypertension   ? Neuropathy   ? Permanent atrial fibrillation ([place]   ? Psoriasis   ? Tubular adenoma of colon   ? ? ?Current Outpatient Medications  ?Medication Sig Dispense Refill  ?  Alpha Lipoic Acid 200 MG CAPS Take 600 mg by mouth daily.     ? amLODipine (NORVASC) 2.5 MG tablet Take 1 tablet (2.5 mg total) by mouth daily. 90 tablet 3  ? aspirin 81 MG tablet Take 81 mg by mouth daily.    ? atorvastatin (LIPITOR) 80 MG tablet TAKE 1 TABLET AT 6PM. 90 tablet 2  ? cholecalciferol (VITAMIN D) 1000 UNITS tablet Take 1,000 Units by mouth daily.    ? Coenzyme Q10 200 MG capsule Take 200 mg by mouth daily.     ? Cyanocobalamin (VITAMIN B 12 PO) Take 100 mg by mouth daily.     ? ELIQUIS 5 MG TABS tablet TAKE 1 TABLET BY MOUTH TWICE DAILY. 180 tablet 1  ? Flaxseed, Linseed, (FLAX SEED OIL PO) Take 1,400 mg by mouth daily.    ? furosemide (LASIX) 40 MG tablet Take 1 tablet (40 mg total) by mouth in the morning AND 0.5 tablets (20 mg total) every evening. 135 tablet 3  ? magnesium oxide (MAG-OX) 400 MG tablet Take 800 mg by mouth daily.    ? Omega-3 Fatty Acids  (FISH OIL) 1200 MG CAPS Take 1,000 mg by mouth daily.    ? pyridoxine (B-6) 200 MG tablet Take 200 mg by mouth daily.    ? vitamin C (ASCORBIC ACID) 500 MG tablet Take 500 mg by mouth 3 (three) times daily.     ? ?No current facility-administered medications for this encounter.  ? ? ?No Known Allergies ? ?  ?Social History  ? ?Socioeconomic History  ? Marital status: Married  ?  Spouse name: [name]  ? Number of children: 2  ? Years of education: Not on file  ? Highest education level: Bachelor's degree (e.g., BA, AB, BS)  ?Occupational History  ? Occupation: retired  ?  Employer: RETIRED  ?Tobacco Use  ? Smoking status: Former  ?  Years: 30.00  ?  Types: Cigarettes  ?  Quit date: 11/25/1993  ?  Years since quitting: 27.5  ? Smokeless tobacco: Never  ?Vaping Use  ? Vaping Use: Never used  ?Substance and Sexual Activity  ? Alcohol use: Yes  ?  Alcohol/week: 1.0 standard drink  ?  Types: 1 Cans of beer per week  ? Drug use: No  ? Sexual activity: Not on file  ?Other Topics Concern  ? Not on file  ?Social History Narrative  ? Not on file  ? ?Social Determinants of Health  ? ?Financial Resource Strain: Not on file  ?Food Insecurity: Not on file  ?Transportation Needs: Not on file  ?Physical Activity: Not on file  ?Stress: Not on file  ?Social Connections: Not on file  ?Intimate Partner Violence: Not on file  ? ? ?  ?Family History  ?Problem Relation Age of Onset  ? Heart disease Father 47  ? Heart disease Paternal Grandmother   ? Heart disease Mother   ? Hypertension Mother   ? Healthy Sister   ? Heart disease Paternal Grandfather   ? Hypertension Paternal Grandfather   ? Colon cancer Neg Hx   ? ? ?Vitals:  ? 05/31/21 1153  ?BP: (!) 150/80  ?Pulse: (!) 50  ?SpO2: 93%  ?Weight: 105.8 kg (233 lb 3.2 oz)  ? ? ?PHYSICAL EXAM: ?General:  Well appearing. No respiratory difficulty ?HEENT: normal ?Neck: supple. JVP 7-8 with prominent v-waves to jaw  Carotids 2+ bilat; no bruits. No lymphadenopathy or thryomegaly  appreciated. ?Cor: PMI nondisplaced. Irregular brad  No rubs,  gallops or murmurs. ?Lungs: clear ?Abdomen: soft, nontender, nondistended. No hepatosplenomegaly. No bruits or masses. Good bowel sounds. ?Extremities: no cyanosis, clubbing, rash, tr edema + varicose veins and chronic venous stasis changes + psoriatic patches ?Neuro: alert & oriented x 3, cranial nerves grossly intact. moves all 4 extremities w/o difficulty. Affect pleasant. ? ?ECG: AF with slow VR 46 narrow QRS Personally reviewed ? ? ? ?ASSESSMENT & PLAN: ? ?1. Pulmonary HTN on echo  ?- Echo 10/22 LVEF >65% RV dilated with normal function. Severe central TR RVSP 20mHG (previously 38)  ?- TEE at time of CABG 11/19 Normal RV. No TR ?- CTA chest 1/23 no PE. + evidence of RV strain   ?- Etiology unclear - suspect mixed picture ?- Proceed with RHC ?- Check PFTs with DLCO and hall walk ?- Check home sleep study ?- VQ scan ?- Serologies  ?- May need cMRI down the road to consider w/u for amyloid  ? ?2. CAD s/p CABG 2019  ?- no s/s angina ?- continue ASA/statin ?- managed by Dr. BGwenlyn Found? ?3. Chronic AF ?- rate slow here but 50-60s at home ?- followed by Dr. BGwenlyn Found ?- Continue Eliquis ? ? ?DGlori Bickers MD  ?12:21 PM ? ?

## 2021-06-01 LAB — ANCA TITERS
Atypical P-ANCA titer: <1:20 {titer}
C-ANCA: <1:20 {titer}
P-ANCA: <1:20 {titer}

## 2021-06-01 LAB — RHEUMATOID FACTOR: Rheumatoid fact SerPl-aCnc: <10 IU/mL (ref <14.0)

## 2021-06-01 LAB — ANA W/REFLEX: Anti Nuclear Antibody (ANA): NEGATIVE

## 2021-06-01 LAB — ANTI-SCLERODERMA ANTIBODY: Scleroderma (Scl-70) (ENA) Antibody, IgG: <0.2 AI (ref 0.0–0.9)

## 2021-06-04 ENCOUNTER — Encounter (INDEPENDENT_AMBULATORY_CARE_PROVIDER_SITE_OTHER): Payer: Medicare Other | Admitting: Cardiology

## 2021-06-04 DIAGNOSIS — G4731 Primary central sleep apnea: Secondary | ICD-10-CM | POA: Diagnosis not present

## 2021-06-04 DIAGNOSIS — G4733 Obstructive sleep apnea (adult) (pediatric): Secondary | ICD-10-CM | POA: Diagnosis not present

## 2021-06-04 DIAGNOSIS — G4734 Idiopathic sleep related nonobstructive alveolar hypoventilation: Secondary | ICD-10-CM | POA: Diagnosis not present

## 2021-06-06 ENCOUNTER — Ambulatory Visit: Payer: Medicare Other

## 2021-06-06 DIAGNOSIS — I272 Pulmonary hypertension, unspecified: Secondary | ICD-10-CM

## 2021-06-06 NOTE — Procedures (Signed)
? ?  Sleep Study Report ?Patient Information ?Study Date: 06/04/21 ?Patient Name: [name] ?Patient ID: [id number] ?Birth Date: 2041/01/03 ?Age: 81 ?Gender: Male ?Referring Physician:Daniel Bensimhon, MD ? ?TEST DESCRIPTION: Home sleep apnea testing was completed using the WatchPat, a Type 1 device, utilizing ?peripheral arterial tonometry (PAT), chest movement, actigraphy, pulse oximetry, pulse rate, body position and snore. ?AHI was calculated with apnea and hypopnea using valid sleep time as the denominator. RDI includes apneas, ?hypopneas, and RERAs. The data acquired and the scoring of sleep and all associated events were performed in ?accordance with the recommended standards and specifications as outlined in the AASM Manual for the Scoring of ?Sleep and Associated Events 2.2.0 (2015). ? ?FINDINGS: ?1. Severe Obstructive Sleep Apnea with AHI 45 /hr. ?2. Mild Central Sleep Apnea with 15/hr. ?3. Oxygen desaturations as low as 85%. ?4. Minimal snoring was present. O2 sats were < 88% for 3.1 min. ?5. Total sleep time was 5 hrs and 28 min. ?6. 20% of total sleep time was spent in REM sleep. ?7. Normal sleep onset latency at 15 min. ?8. Shortened REM sleep onset latency at 54 min. ?9. Total awakenings were 4. ? ?DIAGNOSIS: ?Severe Obstructive Sleep Apnea (G47.33) ?Central Sleep Apnea ? ?RECOMMENDATIONS: ?1. Clinical correlation of these findings is necessary. The decision to treat obstructive sleep apnea (OSA) is usually ?based on the presence of apnea symptoms or the presence of associated medical conditions such as Hypertension, ?Congestive Heart Failure, Atrial Fibrillation or Obesity. The most common symptoms of OSA are snoring, gasping for ?breath while sleeping, daytime sleepiness and fatigue. ? ?2. Initiating apnea therapy is recommended given the presence of symptoms and/or associated conditions. ?Recommend proceeding with one of the following: ? ? a. Auto-CPAP therapy with a pressure range of 5-20cm  H2O. ? ? b. An oral appliance (OA) that can be obtained from certain dentists with expertise in sleep medicine. These are ?primarily of use in non-obese patients with mild and moderate disease. ? ? c. An ENT consultation which may be useful to look for specific causes of obstruction and possible treatment ?options. ? ? d. If patient is intolerant to PAP therapy, consider referral to ENT for evaluation for hypoglossal nerve stimulator. ? ?3. Close follow-up is necessary to ensure success with CPAP or oral appliance therapy for maximum benefit . ? ?4. A follow-up oximetry study on CPAP is recommended to assess the adequacy of therapy and determine the need ?for supplemental oxygen or the potential need for Bi-level therapy. An arterial blood gas to determine the adequacy of ?baseline ventilation and oxygenation should also be considered. ? ?5. Healthy sleep recommendations include: adequate nightly sleep (normal 7-9 hrs/night), avoidance of caffeine after ?noon and alcohol near bedtime, and maintaining a sleep environment that is cool, dark and quiet. ? ?6. Weight loss for overweight patients is recommended. Even modest amounts of weight loss can significantly ?improve the severity of sleep apnea. ? ?7. Snoring recommendations include: weight loss where appropriate, side sleeping, and avoidance of alcohol before ?bed. ? ?8. Operation of motor vehicle should be avoided when sleepy. ? ?Signature: ?Electronically Signed: 06/06/21 ?Fransico Him, MD; [facility]; [place], [place] Board of Sleep Medicine ? ?

## 2021-06-07 ENCOUNTER — Other Ambulatory Visit: Payer: Self-pay

## 2021-06-07 ENCOUNTER — Ambulatory Visit (HOSPITAL_COMMUNITY)
Admission: RE | Admit: 2021-06-07 | Discharge: 2021-06-07 | Disposition: A | Discharge status: Home / Self Care | Payer: Medicare Other | Source: Ambulatory Visit | Attending: Internal Medicine | Admitting: Internal Medicine

## 2021-06-07 DIAGNOSIS — R0600 Dyspnea, unspecified: Secondary | ICD-10-CM | POA: Diagnosis not present

## 2021-06-07 DIAGNOSIS — I272 Pulmonary hypertension, unspecified: Secondary | ICD-10-CM | POA: Insufficient documentation

## 2021-06-07 LAB — PULMONARY FUNCTION TEST
DL/VA % pred: 60 %
DL/VA: 2.32 ml/min/mmHg/L
DLCO unc % pred: 46 %
DLCO unc: 13.26 ml/min/mmHg
FEF 25-75 Post: 0.73 L/sec
FEF 25-75 Pre: 0.47 L/sec
FEF2575-%Change-Post: 53 %
FEF2575-%Pred-Post: 28 %
FEF2575-%Pred-Pre: 18 %
FEV1-%Change-Post: 13 %
FEV1-%Pred-Post: 48 %
FEV1-%Pred-Pre: 42 %
FEV1-Post: 1.75 L
FEV1-Pre: 1.54 L
FEV1FVC-%Change-Post: 0 %
FEV1FVC-%Pred-Pre: 66 %
FEV6-%Change-Post: 15 %
FEV6-%Pred-Post: 67 %
FEV6-%Pred-Pre: 58 %
FEV6-Post: 3.17 L
FEV6-Pre: 2.76 L
FEV6FVC-%Change-Post: 1 %
FEV6FVC-%Pred-Post: 91 %
FEV6FVC-%Pred-Pre: 90 %
FVC-%Change-Post: 13 %
FVC-%Pred-Post: 73 %
FVC-%Pred-Pre: 64 %
FVC-Post: 3.67 L
FVC-Pre: 3.23 L
Post FEV1/FVC ratio: 48 %
Post FEV6/FVC ratio: 86 %
Pre FEV1/FVC ratio: 48 %
Pre FEV6/FVC Ratio: 85 %
RV % pred: 156 %
RV: 4.51 L
TLC % pred: 99 %
TLC: 7.98 L

## 2021-06-07 MED ORDER — ALBUTEROL SULFATE (2.5 MG/3ML) 0.083% IN NEBU
2.5000 mg | INHALATION_SOLUTION | Freq: Once | RESPIRATORY_TRACT | Status: AC
  Administered 2021-06-07: 2.5 mg via RESPIRATORY_TRACT

## 2021-06-08 ENCOUNTER — Telehealth (HOSPITAL_COMMUNITY): Payer: Self-pay | Admitting: *Deleted

## 2021-06-14 ENCOUNTER — Ambulatory Visit (HOSPITAL_COMMUNITY)
Admission: RE | Admit: 2021-06-14 | Discharge: 2021-06-14 | Disposition: A | Discharge status: Home / Self Care | Payer: Medicare Other | Attending: Internal Medicine | Admitting: Internal Medicine

## 2021-06-14 ENCOUNTER — Encounter (HOSPITAL_COMMUNITY)
Admission: RE | Disposition: A | Discharge status: Home / Self Care | Payer: Self-pay | Source: Home / Self Care | Attending: Internal Medicine

## 2021-06-14 ENCOUNTER — Other Ambulatory Visit: Payer: Self-pay

## 2021-06-14 DIAGNOSIS — Z87891 Personal history of nicotine dependence: Secondary | ICD-10-CM | POA: Insufficient documentation

## 2021-06-14 DIAGNOSIS — Z7982 Long term (current) use of aspirin: Secondary | ICD-10-CM | POA: Insufficient documentation

## 2021-06-14 DIAGNOSIS — I2510 Atherosclerotic heart disease of native coronary artery without angina pectoris: Secondary | ICD-10-CM | POA: Insufficient documentation

## 2021-06-14 DIAGNOSIS — Z7901 Long term (current) use of anticoagulants: Secondary | ICD-10-CM | POA: Insufficient documentation

## 2021-06-14 DIAGNOSIS — I10 Essential (primary) hypertension: Secondary | ICD-10-CM | POA: Insufficient documentation

## 2021-06-14 DIAGNOSIS — Z955 Presence of coronary angioplasty implant and graft: Secondary | ICD-10-CM | POA: Diagnosis not present

## 2021-06-14 DIAGNOSIS — I2721 Secondary pulmonary arterial hypertension: Secondary | ICD-10-CM | POA: Diagnosis not present

## 2021-06-14 DIAGNOSIS — J449 Chronic obstructive pulmonary disease, unspecified: Secondary | ICD-10-CM | POA: Insufficient documentation

## 2021-06-14 DIAGNOSIS — Z951 Presence of aortocoronary bypass graft: Secondary | ICD-10-CM | POA: Diagnosis not present

## 2021-06-14 DIAGNOSIS — Z79899 Other long term (current) drug therapy: Secondary | ICD-10-CM | POA: Insufficient documentation

## 2021-06-14 DIAGNOSIS — I272 Pulmonary hypertension, unspecified: Secondary | ICD-10-CM

## 2021-06-14 DIAGNOSIS — I482 Chronic atrial fibrillation, unspecified: Secondary | ICD-10-CM | POA: Diagnosis not present

## 2021-06-14 HISTORY — PX: RIGHT HEART CATH: CATH118263

## 2021-06-14 LAB — POCT I-STAT EG7
Acid-Base Excess: 2 mmol/L (ref 0.0–2.0)
Acid-Base Excess: 2 mmol/L (ref 0.0–2.0)
Bicarbonate: 27.1 mmol/L (ref 20.0–28.0)
Bicarbonate: 27.3 mmol/L (ref 20.0–28.0)
Calcium, Ion: 1.18 mmol/L (ref 1.15–1.40)
Calcium, Ion: 1.22 mmol/L (ref 1.15–1.40)
HCT: 39 % (ref 39.0–52.0)
HCT: 39 % (ref 39.0–52.0)
Hemoglobin: 13.3 g/dL (ref 13.0–17.0)
Hemoglobin: 13.3 g/dL (ref 13.0–17.0)
O2 Saturation: 64 %
O2 Saturation: 69 %
Potassium: 4 mmol/L (ref 3.5–5.1)
Potassium: 4.1 mmol/L (ref 3.5–5.1)
Sodium: 139 mmol/L (ref 135–145)
Sodium: 140 mmol/L (ref 135–145)
TCO2: 28 mmol/L (ref 22–32)
TCO2: 29 mmol/L (ref 22–32)
pCO2, Ven: 43.2 mmHg — ABNORMAL LOW (ref 44–60)
pCO2, Ven: 44.4 mmHg (ref 44–60)
pH, Ven: 7.398 (ref 7.25–7.43)
pH, Ven: 7.406 (ref 7.25–7.43)
pO2, Ven: 33 mmHg (ref 32–45)
pO2, Ven: 36 mmHg (ref 32–45)

## 2021-06-14 SURGERY — RIGHT HEART CATH
Anesthesia: LOCAL

## 2021-06-14 MED ORDER — SODIUM CHLORIDE 0.9 % IV SOLN: INTRAVENOUS | Status: DC

## 2021-06-14 MED ORDER — SODIUM CHLORIDE 0.9 % IV SOLN: 250.0000 mL | INTRAVENOUS | Status: DC | PRN

## 2021-06-14 MED ORDER — SODIUM CHLORIDE 0.9% FLUSH: 3.0000 mL | INTRAVENOUS | Status: DC | PRN

## 2021-06-14 MED ORDER — ONDANSETRON HCL 4 MG/2ML IJ SOLN: 4.0000 mg | Freq: Four times a day (QID) | INTRAMUSCULAR | Status: DC | PRN

## 2021-06-14 MED ORDER — HEPARIN (PORCINE) IN NACL 1000-0.9 UT/500ML-% IV SOLN
INTRAVENOUS | Status: DC | PRN
Start: 2021-06-14 — End: 2021-06-14
  Administered 2021-06-14: 500 mL

## 2021-06-14 MED ORDER — MIDAZOLAM HCL 2 MG/2ML IJ SOLN
INTRAMUSCULAR | Status: DC | PRN
  Administered 2021-06-14: 1 mg via INTRAVENOUS

## 2021-06-14 MED ORDER — HEPARIN (PORCINE) IN NACL 1000-0.9 UT/500ML-% IV SOLN
INTRAVENOUS | Status: AC
  Filled 2021-06-14: qty 500

## 2021-06-14 MED ORDER — HYDRALAZINE HCL 20 MG/ML IJ SOLN: 10.0000 mg | INTRAMUSCULAR | Status: DC | PRN

## 2021-06-14 MED ORDER — ACETAMINOPHEN 325 MG PO TABS: 650.0000 mg | ORAL_TABLET | ORAL | Status: DC | PRN

## 2021-06-14 MED ORDER — SODIUM CHLORIDE 0.9% FLUSH: 3.0000 mL | Freq: Two times a day (BID) | INTRAVENOUS | Status: DC

## 2021-06-14 MED ORDER — LIDOCAINE HCL (PF) 1 % IJ SOLN
INTRAMUSCULAR | Status: AC
  Filled 2021-06-14: qty 30

## 2021-06-14 MED ORDER — MIDAZOLAM HCL 2 MG/2ML IJ SOLN
INTRAMUSCULAR | Status: AC
  Filled 2021-06-14: qty 2

## 2021-06-14 MED ORDER — ASPIRIN 81 MG PO CHEW: 81.0000 mg | CHEWABLE_TABLET | ORAL | Status: DC

## 2021-06-14 MED ORDER — LABETALOL HCL 5 MG/ML IV SOLN: 10.0000 mg | INTRAVENOUS | Status: DC | PRN

## 2021-06-14 MED ORDER — LIDOCAINE HCL (PF) 1 % IJ SOLN
INTRAMUSCULAR | Status: DC | PRN
  Administered 2021-06-14: 1 mL

## 2021-06-14 SURGICAL SUPPLY — 8 items
CATH BALLN WEDGE 5F 110CM (CATHETERS) ×1 IMPLANT
PACK CARDIAC CATHETERIZATION (CUSTOM PROCEDURE TRAY) ×2 IMPLANT
PROTECTION STATION PRESSURIZED (MISCELLANEOUS) ×2
SHEATH GLIDE SLENDER 4/5FR (SHEATH) ×1 IMPLANT
STATION PROTECTION PRESSURIZED (MISCELLANEOUS) IMPLANT
TRANSDUCER W/STOPCOCK (MISCELLANEOUS) ×2 IMPLANT
TUBING ART PRESS 72  MALE/FEM (TUBING) ×2
TUBING ART PRESS 72 MALE/FEM (TUBING) IMPLANT

## 2021-06-14 NOTE — Interval H&P Note (Signed)
History and Physical Interval Note: ? ?06/14/2021 ?11:19 AM ? ?[name]  has presented today for surgery, with the diagnosis of pulmonary HTN.  The various methods of treatment have been discussed with the patient and family. After consideration of risks, benefits and other options for treatment, the patient has consented to  Procedure(s): ?RIGHT HEART CATH (N/A) as a surgical intervention.  The patient's history has been reviewed, patient examined, no change in status, stable for surgery.  I have reviewed the patient's chart and labs.  Questions were answered to the patient's satisfaction.   ? ? ?[name] ? ? ?

## 2021-06-15 ENCOUNTER — Encounter (HOSPITAL_COMMUNITY): Payer: Self-pay | Admitting: Internal Medicine

## 2021-06-19 ENCOUNTER — Telehealth: Payer: Self-pay | Admitting: *Deleted

## 2021-06-19 ENCOUNTER — Other Ambulatory Visit: Payer: Self-pay | Admitting: Cardiology

## 2021-06-19 DIAGNOSIS — G4733 Obstructive sleep apnea (adult) (pediatric): Secondary | ICD-10-CM

## 2021-06-19 NOTE — Telephone Encounter (Signed)
Patient notified of HST results and recommendations. He has no questions and agrees to proceed with CPAP titration. He was also given the number to the sleep lab for scheduling if he does not hear from the sleep lab. ?

## 2021-06-19 NOTE — Telephone Encounter (Signed)
-----   Message from Sueanne Margarita, MD sent at 06/06/2021  4:28 PM EDT ----- ?Please let patient know that they have sleep apnea.  Recommend therapeutic CPAP titration for treatment of patient's sleep disordered breathing.   ?

## 2021-06-28 ENCOUNTER — Ambulatory Visit (HOSPITAL_COMMUNITY)
Admission: RE | Admit: 2021-06-28 | Discharge: 2021-06-28 | Disposition: A | Discharge status: Home / Self Care | Payer: Medicare Other | Source: Ambulatory Visit | Attending: Internal Medicine | Admitting: Internal Medicine

## 2021-06-28 DIAGNOSIS — I272 Pulmonary hypertension, unspecified: Secondary | ICD-10-CM | POA: Insufficient documentation

## 2021-06-28 DIAGNOSIS — R0602 Shortness of breath: Secondary | ICD-10-CM | POA: Diagnosis not present

## 2021-06-28 DIAGNOSIS — R0600 Dyspnea, unspecified: Secondary | ICD-10-CM | POA: Insufficient documentation

## 2021-06-28 DIAGNOSIS — J439 Emphysema, unspecified: Secondary | ICD-10-CM | POA: Diagnosis not present

## 2021-06-28 DIAGNOSIS — I10 Essential (primary) hypertension: Secondary | ICD-10-CM | POA: Diagnosis not present

## 2021-06-28 DIAGNOSIS — J449 Chronic obstructive pulmonary disease, unspecified: Secondary | ICD-10-CM | POA: Diagnosis not present

## 2021-06-28 MED ORDER — TECHNETIUM TO 99M ALBUMIN AGGREGATED
3.8000 | Freq: Once | INTRAVENOUS | Status: AC | PRN
  Administered 2021-06-28: 3.8 via INTRAVENOUS

## 2021-06-29 ENCOUNTER — Encounter (HOSPITAL_COMMUNITY): Payer: Self-pay | Admitting: Cardiology

## 2021-07-04 ENCOUNTER — Ambulatory Visit (INDEPENDENT_AMBULATORY_CARE_PROVIDER_SITE_OTHER): Payer: Medicare Other | Admitting: Cardiovascular Disease

## 2021-07-04 ENCOUNTER — Encounter: Payer: Self-pay | Admitting: Cardiovascular Disease

## 2021-07-04 VITALS — BP 138/68 | HR 53 | Ht 75.0 in | Wt 234.0 lb

## 2021-07-04 DIAGNOSIS — I2510 Atherosclerotic heart disease of native coronary artery without angina pectoris: Secondary | ICD-10-CM | POA: Diagnosis not present

## 2021-07-04 DIAGNOSIS — I739 Peripheral vascular disease, unspecified: Secondary | ICD-10-CM

## 2021-07-04 DIAGNOSIS — E785 Hyperlipidemia, unspecified: Secondary | ICD-10-CM | POA: Diagnosis not present

## 2021-07-04 DIAGNOSIS — I6521 Occlusion and stenosis of right carotid artery: Secondary | ICD-10-CM | POA: Diagnosis not present

## 2021-07-04 DIAGNOSIS — I272 Pulmonary hypertension, unspecified: Secondary | ICD-10-CM

## 2021-07-04 DIAGNOSIS — G4733 Obstructive sleep apnea (adult) (pediatric): Secondary | ICD-10-CM | POA: Diagnosis not present

## 2021-07-04 DIAGNOSIS — I10 Essential (primary) hypertension: Secondary | ICD-10-CM | POA: Diagnosis not present

## 2021-07-04 DIAGNOSIS — Z9861 Coronary angioplasty status: Secondary | ICD-10-CM

## 2021-07-04 MED ORDER — FUROSEMIDE 40 MG PO TABS: ORAL_TABLET | ORAL | 3 refills | Status: DC

## 2021-07-04 NOTE — Assessment & Plan Note (Signed)
History of CAD status post RCA stenting by myself 01/11/1998.  He had normal circumflex and LAD at that time.  He had a non-STEMI 01/21/2018 and underwent cardiac catheterization by Dr. Saunders Revel 2 days later revealing left main/three-vessel disease.  He underwent CABG by Dr. Cyndia Bent 01/28/2018 with a LIMA to his LAD, vein to an obtuse marginal branch, PDA and PLA sequentially.  He had left atrial clipping as well.  He completed cardiac rehab.  He denies chest pain but does have significant progressive dyspnea on exertion. ?

## 2021-07-04 NOTE — Assessment & Plan Note (Signed)
History of persistent atrial fibrillation rate controlled on Eliquis oral anticoagulation. 

## 2021-07-04 NOTE — Patient Instructions (Signed)
Medication Instructions:  ? ?-Increase furosemide (lasix) to '40mg'$  twice daily.  ? ?*If you need a refill on your cardiac medications before your next appointment, please call your pharmacy* ? ? ?Lab Work: ?Your physician recommends that you return for lab work in: 7-10 days for BMET ? ? ?If you have labs (blood work) drawn today and your tests are completely normal, you will receive your results only by: ?MyChart Message (if you have MyChart) OR ?A paper copy in the mail ?If you have any lab test that is abnormal or we need to change your treatment, we will call you to review the results. ? ? ? ?Follow-Up: ?At [facility], you and your health needs are our priority.  As part of our continuing mission to provide you with exceptional heart care, we have created designated Provider Care Teams.  These Care Teams include your primary Cardiologist (physician) and Advanced Practice Providers (APPs -  Physician Assistants and Nurse Practitioners) who all work together to provide you with the care you need, when you need it. ? ?We recommend signing up for the patient portal called "MyChart".  Sign up information is provided on this After Visit Summary.  MyChart is used to connect with patients for Virtual Visits (Telemedicine).  Patients are able to view lab/test results, encounter notes, upcoming appointments, etc.  Non-urgent messages can be sent to your provider as well.   ?To learn more about what you can do with MyChart, go to [email or web address].   ? ?Your next appointment:   ?3 month(s) ? ?The format for your next appointment:   ?In Person ? ?Provider:   ?[name], MD  ?

## 2021-07-04 NOTE — Assessment & Plan Note (Signed)
History of symptomatic peripheral arterial disease with Dopplers performed 04/02/2021 revealing noncompressible ABIs with high-grade bilateral iliac disease right greater than left as well as proximal right SFA disease.  At this point, despite his claudication, he wishes to prioritize his dyspnea work-up initially and we can circle back around and address his peripheral vascular issues at a later date. ?

## 2021-07-04 NOTE — Assessment & Plan Note (Signed)
History of pulmonary hypertension with echo performed 12/20/2020 revealing severe TR with severe pulmonary hypertension.  He did undergo right heart cath by Dr. Haroldine Laws 06/14/2021 revealing a pulmonary artery pressure of 43/11.  He had prominent V waves in his RA and wedge tracing suggesting MR and TR.  He did have a chest CTA that ruled out pulmonary thromboembolic disease as well as a negative VQ scan.  He is scheduled to see Dr. Britta Mccreedy on back next month.  He does have 2+ pitting edema on exam and severe dyspnea as well as CAD and V waves.  I am going to increase his furosemide to 40 mg p.o. twice daily. ?

## 2021-07-04 NOTE — Assessment & Plan Note (Signed)
History of dyslipidemia on statin therapy with lipid profile performed 01/06/2021 revealing total cholesterol of 110, LDL of 45 and HDL 49. ?

## 2021-07-04 NOTE — Progress Notes (Signed)
? ? ? ?07/04/2021 ?Toma Copier   ?[birth year]  ?[id number] ? ?Primary Physician Lavone Orn, MD ?Primary Cardiologist: Lorretta Harp MD Lupe Carney, [place] ? ?HPI:  [name] is a 81 y.o.  mildly overweight, married Caucasian male father of 2, grandfather of 3 grandchildren who I saw  in the office 05/15/2021.[name] He has a history of CAD status post RCA stenting by myself January 11, 1998. He had normal circumflex, LAD and ejection fraction. His other problems include hypertension and chronic atrial fibrillation on Coumadin anticoagulation, rate controlled, as well as erectile dysfunction. He is totally asymptomatic. He did have colon cancer picked up on screening colonoscopy and underwent right colectomy May 29, 2006 followed by 11 cycles of adjuvant Folfox chemotherapy. [name] He is very active exercises 6 days a week doing weight training 3 days a week, and lower extremity training 3 times a week.. ?  ?He was admitted to the hospital on 01/21/2018 with non-STEMI.  He underwent cardiac catheterization 2 days later by Dr. Saunders Revel revealing left main disease but disease in his LAD and RCA as well.  He underwent CABG by Dr. Cyndia Bent on 01/28/2018 with a LIMA to his LAD, vein to an obtuse marginal branch and to the PDA and PLA sequentially.  He also had left atrial clipping at that time.    He ended up completing cardiac rehab as an outpatient. ?  ?He saw [name], NP in the office 12/14/2020 who placed him on a low-dose diuretic and obtain a 2D echocardiogram which was performed 12/20/2020 revealing severe TR with pulmonary hypertension which is new for him.  He is fairly active but does complain of dyspnea on exertion which is a new symptom.   ?  ?His symptoms of dyspnea significantly improved with the addition of a diuretic.  I did go chest CTA that showed no evidence of thromboembolic disease.  In addition, I did lower extremity arterial Doppler studies that show significant iliac disease bilaterally.  He  does complain of lifestyle limiting claudication. ? ?Since I saw him 2 months ago he did have a right heart cath performed by Dr. Haroldine Laws 06/14/2021 that showed mild pulmonary hypertension although he had V waves in both the right atrium and the wedge positions suggesting MR and TR.  He had a sleep study that showed severe obstructive sleep apnea.  He does have an appointment with Dr. Haroldine Laws at the end of May to discuss the right heart findings.  He may need a cardiac MRI and a TEE to further evaluate.  A chest CTA was negative for pulmonary thromboembolic disease as well as a VQ scan.  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 ? ? ?Current Meds  ?Medication Sig  ? Alpha Lipoic Acid 200 MG CAPS Take 600 mg by mouth daily.   ? amLODipine (NORVASC) 2.5 MG tablet Take 1 tablet (2.5 mg total) by mouth daily. (Patient taking differently: Take 2.5 mg by mouth at bedtime.)  ? aspirin 81 MG tablet Take 81 mg by mouth daily.  ? atorvastatin (LIPITOR) 80 MG tablet TAKE 1 TABLET AT 6PM. (Patient taking differently: Take 80 mg by mouth daily with supper.)  ? cholecalciferol (VITAMIN D) 1000 UNITS tablet Take 1,000 Units by mouth daily.  ? Coenzyme Q10 200 MG capsule Take 200 mg by mouth daily.   ? Cyanocobalamin (VITAMIN B 12  PO) Take 1 tablet by mouth daily.  ? ELIQUIS 5 MG TABS tablet TAKE 1 TABLET BY MOUTH TWICE DAILY.  ? Flaxseed, Linseed, (FLAX SEED OIL PO) Take 1 tablet by mouth daily.  ? magnesium oxide (MAG-OX) 400 MG tablet Take 800 mg by mouth daily.  ? Omega-3 Fatty Acids (FISH OIL) 1200 MG CAPS Take 1,200 mg by mouth daily.  ? Polyethyl Glycol-Propyl Glycol (SYSTANE) 0.4-0.3 % GEL ophthalmic gel Place 1 application. into both eyes every 8 (eight) hours as needed (dry eyes).  ? pyridoxine (B-6) 200 MG tablet Take 200 mg by mouth daily.  ? vitamin C (ASCORBIC ACID) 500 MG  tablet Take 500 mg by mouth 3 (three) times daily.   ? [DISCONTINUED] furosemide (LASIX) 40 MG tablet Take 1 tablet (40 mg total) by mouth in the morning AND 0.5 tablets (20 mg total) every evening.  ?  ? ?No Known Allergies ? ?Social History  ? ?Socioeconomic History  ? Marital status: Married  ?  Spouse name: [name]  ? Number of children: 2  ? Years of education: Not on file  ? Highest education level: Bachelor's degree (e.g., BA, AB, BS)  ?Occupational History  ? Occupation: retired  ?  Employer: RETIRED  ?Tobacco Use  ? Smoking status: Former  ?  Years: 30.00  ?  Types: Cigarettes  ?  Quit date: 11/25/1993  ?  Years since quitting: 27.6  ? Smokeless tobacco: Never  ?Vaping Use  ? Vaping Use: Never used  ?Substance and Sexual Activity  ? Alcohol use: Yes  ?  Alcohol/week: 1.0 standard drink  ?  Types: 1 Cans of beer per week  ? Drug use: No  ? Sexual activity: Not on file  ?Other Topics Concern  ? Not on file  ?Social History Narrative  ? Not on file  ? ?Social Determinants of Health  ? ?Financial Resource Strain: Not on file  ?Food Insecurity: Not on file  ?Transportation Needs: Not on file  ?Physical Activity: Not on file  ?Stress: Not on file  ?Social Connections: Not on file  ?Intimate Partner Violence: Not on file  ?  ? ?Review of Systems: ?General: negative for chills, fever, night sweats or weight changes.  ?Cardiovascular: negative for chest pain, dyspnea on exertion, edema, orthopnea, palpitations, paroxysmal nocturnal dyspnea or shortness of breath ?Dermatological: negative for rash ?Respiratory: negative for cough or wheezing ?Urologic: negative for hematuria ?Abdominal: negative for nausea, vomiting, diarrhea, bright red blood per rectum, melena, or hematemesis ?Neurologic: negative for visual changes, syncope, or dizziness ?All other systems reviewed and are otherwise negative except as noted above. ? ? ? ?Blood pressure 138/68, pulse (!) 53, height '6\' 3"'$  (1.905 m), weight 234 lb (106.1 kg), SpO2 94  %.  ?General appearance: alert and no distress ?Neck: no adenopathy, no carotid bruit, no JVD, supple, symmetrical, trachea midline, and thyroid not enlarged, symmetric, no tenderness/mass/nodules ?Lungs: clear to auscultation bilaterally ?Heart: irregularly irregular rhythm ?Extremities: 2+ edema bilaterally ?Pulses: 2+ and symmetric ?Skin: Skin color, texture, turgor normal. No rashes or lesions ?Neurologic: Grossly normal ? ?EKG atrial fibrillation with right bundle branch block, left intrafascicular block and a ventricular response of 53.  I personally reviewed this EKG. ? ?ASSESSMENT AND PLAN:  ? ?Essential hypertension ?History of essential hypertension with blood pressure measured today at 138/68.  He is on amlodipine. ? ?Atrial fibrillation ([place]) ?History of persistent atrial fibrillation rate controlled on Eliquis oral anticoagulation. ? ?Carotid stenosis, asymptomatic ?History of moderate right ICA stenosis  by duplex ultrasound 12/29/2020.  This will be repeated on an annual basis. ? ?CAD S/P percutaneous coronary angioplasty ?History of CAD status post RCA stenting by myself 01/11/1998.  He had normal circumflex and LAD at that time.  He had a non-STEMI 01/21/2018 and underwent cardiac catheterization by Dr. Saunders Revel 2 days later revealing left main/three-vessel disease.  He underwent CABG by Dr. Cyndia Bent 01/28/2018 with a LIMA to his LAD, vein to an obtuse marginal branch, PDA and PLA sequentially.  He had left atrial clipping as well.  He completed cardiac rehab.  He denies chest pain but does have significant progressive dyspnea on exertion. ? ?Dyslipidemia, goal LDL below 70 ?History of dyslipidemia on statin therapy with lipid profile performed 01/06/2021 revealing total cholesterol of 110, LDL of 45 and HDL 49. ? ?Pulmonary hypertension, unspecified ([place]) ?History of pulmonary hypertension with echo performed 12/20/2020 revealing severe TR with severe pulmonary hypertension.  He did undergo right heart cath  by Dr. Haroldine Laws 06/14/2021 revealing a pulmonary artery pressure of 43/11.  He had prominent V waves in his RA and wedge tracing suggesting MR and TR.  He did have a chest CTA that ruled out pulmonary t

## 2021-07-04 NOTE — Assessment & Plan Note (Signed)
Recent home sleep study performed Dr. Radford Pax 06/04/2021 revealed severe obstructive sleep apnea.  He is scheduled for outpatient sleep study in the near future.  This certainly could be contributing to his pulmonary hypertension. ?

## 2021-07-04 NOTE — Assessment & Plan Note (Signed)
History of essential hypertension with blood pressure measured today at 138/68.  He is on amlodipine. ?

## 2021-07-04 NOTE — Assessment & Plan Note (Signed)
History of moderate right ICA stenosis by duplex ultrasound 12/29/2020.  This will be repeated on an annual basis. ?

## 2021-07-13 DIAGNOSIS — I6521 Occlusion and stenosis of right carotid artery: Secondary | ICD-10-CM | POA: Diagnosis not present

## 2021-07-13 DIAGNOSIS — I10 Essential (primary) hypertension: Secondary | ICD-10-CM | POA: Diagnosis not present

## 2021-07-13 DIAGNOSIS — I272 Pulmonary hypertension, unspecified: Secondary | ICD-10-CM | POA: Diagnosis not present

## 2021-07-14 LAB — BASIC METABOLIC PANEL
BUN/Creatinine Ratio: 21 (ref 10–24)
BUN: 15 mg/dL (ref 8–27)
CO2: 23 mmol/L (ref 20–29)
Calcium: 9.4 mg/dL (ref 8.6–10.2)
Chloride: 98 mmol/L (ref 96–106)
Creatinine, Ser: 0.7 mg/dL — ABNORMAL LOW (ref 0.76–1.27)
Glucose: 88 mg/dL (ref 70–99)
Potassium: 3.8 mmol/L (ref 3.5–5.2)
Sodium: 138 mmol/L (ref 134–144)
eGFR: 94 mL/min/{1.73_m2} (ref >59)

## 2021-07-18 ENCOUNTER — Ambulatory Visit (HOSPITAL_BASED_OUTPATIENT_CLINIC_OR_DEPARTMENT_OTHER): Payer: Medicare Other | Attending: Cardiology | Admitting: Cardiology

## 2021-07-18 VITALS — Ht 74.0 in | Wt 230.0 lb

## 2021-07-18 DIAGNOSIS — G4733 Obstructive sleep apnea (adult) (pediatric): Secondary | ICD-10-CM | POA: Diagnosis not present

## 2021-07-21 NOTE — Procedures (Signed)
? ?  Patient Name: [name], [place] ?Study Date: 07/18/2021 ?Gender: Male ?D.O.B: [birth year] ?Age (years): 5 ?Referring Provider: Fransico Him MD, ABSM ?Height (inches): 74 ?Interpreting Physician: Fransico Him MD, ABSM ?Weight (lbs): 230 ?RPSGT: Steffey, Lennette Bihari ?BMI: 30 ?MRN: [id number] ?Neck Size: 17.50 ? ?CLINICAL INFORMATION ?The patient is referred for a BiPAP titration to treat sleep apnea. ? ?SLEEP STUDY TECHNIQUE ?As per the AASM Manual for the Scoring of Sleep and Associated Events v2.3 (April 2016) with a hypopnea requiring 4% desaturations. ? ?The channels recorded and monitored were frontal, central and occipital EEG, electrooculogram (EOG), submentalis EMG (chin), nasal and oral airflow, thoracic and abdominal wall motion, anterior tibialis EMG, snore microphone, electrocardiogram, and pulse oximetry. Bilevel positive airway pressure (BPAP) was initiated at the beginning of the study and titrated to treat sleep-disordered breathing. ? ?MEDICATIONS ?Medications self-administered by patient taken the night of the study : N/A ? ?RESPIRATORY PARAMETERS ?Optimal IPAP Pressure (cm): 22  ?AHI at Optimal Pressure (/hr) 0 ?Optimal EPAP Pressure (cm):18  ?Overall Minimal O2 (%):87.0  ?Minimal O2 at Optimal Pressure (%): 89.0 ? ?SLEEP ARCHITECTURE ?Start Time:11:00:33 PM  ?Stop AXEN:4:07:68 AM  ?Total Time (min):366.4  ?Total Sleep Time (min):249 ?Sleep Latency (min):29.3  ?Sleep Efficiency (%): 68.0%  ?REM Latency (min):129.0  ?WASO (min):88.1 ?Stage N1 (%): 17.5% Stage N2 (%): 64.7%  ?Stage N3 (%): 0.0%  ?Stage R (%):17.9 ?Supine (%):46.98  ?Arousal Index (/hr):13.7  ? ?CARDIAC DATA ?The 2 lead EKG demonstrated sinus rhythm. The mean heart rate was 55.4 beats per minute. Other EKG findings include: None. ? ?LEG MOVEMENT DATA ?The total Periodic Limb Movements of Sleep (PLMS) were 0. The PLMS index was 0.0. A PLMS index of <15 is considered normal in adults. ? ?IMPRESSIONS ?- An optimal PAP pressure was selected  for this patient ( 22/18 cm of water) ?- Central sleep apnea was not noted during this titration (CAI = 4.6/h). ?- Mild oxygen desaturations were observed during this titration (min O2 = 87.0%). ?- The patient snored with soft snoring volume. ?- No cardiac abnormalities were observed during this study. ?- Clinically significant periodic limb movements were not noted during this study. Arousals associated with PLMs were rare. ? ?DIAGNOSIS ?- Obstructive Sleep Apnea (G47.33) ? ?RECOMMENDATIONS ?- Trial of auto BiPAP therapy with IPAP max 20cm H2O, EPAP min 5cm H2O and PS 4cm H2O with a Medium size Fisher[employer]Paykel Full Face Simplus mask and heated humidification. ?- Avoid alcohol, sedatives and other CNS depressants that may worsen sleep apnea and disrupt normal sleep architecture. ?- Sleep hygiene should be reviewed to assess factors that may improve sleep quality. ?- Weight management and regular exercise should be initiated or continued. ?- Return to [facility] Center for re-evaluation after 6 weeks of therapy ? ?[Electronically signed] 07/21/2021 03:50 PM ? ?Fransico Him MD, ABSM ?Diplomate, Tax adviser of Sleep Medicine ?

## 2021-07-27 ENCOUNTER — Telehealth: Payer: Self-pay | Admitting: *Deleted

## 2021-07-27 NOTE — Telephone Encounter (Signed)
-----   Message from Lauralee Evener, [place] sent at 07/21/2021  4:05 PM EDT ----- ? ?----- Message ----- ?From: Sueanne Margarita, MD ?Sent: 07/21/2021   3:52 PM EDT ?To: [facility] Sleep Studies ? ?Please let patient know that they had a successful PAP titration and let DME know that orders are in EPIC.  Please set up 6 week OV with me.  ?  ? ?

## 2021-07-27 NOTE — Telephone Encounter (Signed)
The patient has been notified of the result and verbalized understanding.  All questions (if any) were answered. ?Marolyn Hammock, [place] 07/27/2021 10:57 AM   ? ?Upon patient request DME selection is [facility] ?Patient understands he will be contacted by [facility] to set up his cpap. ?Patient understands to call if [facility] does not contact him with new setup in a timely manner. ?Patient understands they will be called once confirmation has been received from [facility]/ that they have received their new machine to schedule 10 week follow up appointment. ?  ?[facility] notified of new cpap order  ?Please add to airview ?Patient was grateful for the call and thanked me.  ?

## 2021-08-10 ENCOUNTER — Encounter (HOSPITAL_COMMUNITY): Payer: Self-pay | Admitting: Internal Medicine

## 2021-08-10 ENCOUNTER — Ambulatory Visit (HOSPITAL_COMMUNITY)
Admission: RE | Admit: 2021-08-10 | Discharge: 2021-08-10 | Disposition: A | Discharge status: Home / Self Care | Payer: Medicare Other | Source: Ambulatory Visit | Attending: Internal Medicine | Admitting: Internal Medicine

## 2021-08-10 VITALS — BP 138/60 | HR 61 | Wt 220.8 lb

## 2021-08-10 DIAGNOSIS — I739 Peripheral vascular disease, unspecified: Secondary | ICD-10-CM | POA: Diagnosis not present

## 2021-08-10 DIAGNOSIS — Z9049 Acquired absence of other specified parts of digestive tract: Secondary | ICD-10-CM | POA: Insufficient documentation

## 2021-08-10 DIAGNOSIS — Z6828 Body mass index (BMI) 28.0-28.9, adult: Secondary | ICD-10-CM | POA: Diagnosis not present

## 2021-08-10 DIAGNOSIS — I2510 Atherosclerotic heart disease of native coronary artery without angina pectoris: Secondary | ICD-10-CM | POA: Diagnosis not present

## 2021-08-10 DIAGNOSIS — Z7982 Long term (current) use of aspirin: Secondary | ICD-10-CM | POA: Diagnosis not present

## 2021-08-10 DIAGNOSIS — I10 Essential (primary) hypertension: Secondary | ICD-10-CM | POA: Insufficient documentation

## 2021-08-10 DIAGNOSIS — E669 Obesity, unspecified: Secondary | ICD-10-CM | POA: Insufficient documentation

## 2021-08-10 DIAGNOSIS — I482 Chronic atrial fibrillation, unspecified: Secondary | ICD-10-CM | POA: Diagnosis not present

## 2021-08-10 DIAGNOSIS — Z79899 Other long term (current) drug therapy: Secondary | ICD-10-CM | POA: Insufficient documentation

## 2021-08-10 DIAGNOSIS — Z951 Presence of aortocoronary bypass graft: Secondary | ICD-10-CM | POA: Insufficient documentation

## 2021-08-10 DIAGNOSIS — Z7901 Long term (current) use of anticoagulants: Secondary | ICD-10-CM | POA: Insufficient documentation

## 2021-08-10 DIAGNOSIS — R600 Localized edema: Secondary | ICD-10-CM | POA: Insufficient documentation

## 2021-08-10 DIAGNOSIS — G4733 Obstructive sleep apnea (adult) (pediatric): Secondary | ICD-10-CM | POA: Insufficient documentation

## 2021-08-10 DIAGNOSIS — R0600 Dyspnea, unspecified: Secondary | ICD-10-CM | POA: Diagnosis not present

## 2021-08-10 DIAGNOSIS — Z955 Presence of coronary angioplasty implant and graft: Secondary | ICD-10-CM | POA: Diagnosis not present

## 2021-08-10 DIAGNOSIS — I272 Pulmonary hypertension, unspecified: Secondary | ICD-10-CM | POA: Diagnosis not present

## 2021-08-10 DIAGNOSIS — I071 Rheumatic tricuspid insufficiency: Secondary | ICD-10-CM | POA: Insufficient documentation

## 2021-08-10 DIAGNOSIS — Z87891 Personal history of nicotine dependence: Secondary | ICD-10-CM | POA: Diagnosis not present

## 2021-08-10 DIAGNOSIS — J449 Chronic obstructive pulmonary disease, unspecified: Secondary | ICD-10-CM | POA: Diagnosis not present

## 2021-08-10 LAB — BASIC METABOLIC PANEL
Anion gap: 9 (ref 5–15)
BUN: 19 mg/dL (ref 8–23)
CO2: 28 mmol/L (ref 22–32)
Calcium: 9.5 mg/dL (ref 8.9–10.3)
Chloride: 102 mmol/L (ref 98–111)
Creatinine, Ser: 0.82 mg/dL (ref 0.61–1.24)
GFR, Estimated: >60 mL/min (ref >60)
Glucose, Bld: 98 mg/dL (ref 70–99)
Potassium: 4.6 mmol/L (ref 3.5–5.1)
Sodium: 139 mmol/L (ref 135–145)

## 2021-08-10 LAB — BRAIN NATRIURETIC PEPTIDE: B Natriuretic Peptide: 124.9 pg/mL — ABNORMAL HIGH (ref 0.0–100.0)

## 2021-08-10 LAB — CBC
HCT: 40.2 % (ref 39.0–52.0)
Hemoglobin: 13 g/dL (ref 13.0–17.0)
MCH: 30.3 pg (ref 26.0–34.0)
MCHC: 32.3 g/dL (ref 30.0–36.0)
MCV: 93.7 fL (ref 80.0–100.0)
Platelets: 156 10*3/uL (ref 150–400)
RBC: 4.29 MIL/uL (ref 4.22–5.81)
RDW: 14.6 % (ref 11.5–15.5)
WBC: 9.2 10*3/uL (ref 4.0–10.5)
nRBC: 0 % (ref 0.0–0.2)

## 2021-08-10 NOTE — Progress Notes (Signed)
[facility]ED [facility] CLINIC CONSULT NOTE  Referring Physician:Jonathan Gwenlyn Found, MD  Primary Care: Lavone Orn, MD Primary Cardiologist: [name], MD   HPI:  [name] is a 81 y.o. [place] male with obesity, HTN, chronic AF, CAD s/p RCA stenting in 1999 and CABG 11/19, PAD, colonCA s/p right colectomy 3/08 followed by 11 cycles of adjuvant Folfox chemotherapy. Referred by Dr. 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  Quit smoking 25 years ago   Echo 10/22 LVEF >65% RV dilated with normal function. Severe central TR RVSP 38mHG (previously 38)   TEE at time of CABG 11/19 Normal RV. No TR  CTA chest no PE. + evidence of RV strain    VQ 4/23 No clots  PFTs 3/23: FEV1 1.54 (42%) FVC 3.23 (64) FEV1/FVC 48% DLCO 46%  RA = 12 (v- waves to 23) RV = 40/14 PA = 43/11 (26) PCW = 14 (v- waves up to 35) Fick cardiac output/index = 6.6/2.9 PVR = 1.8 WU Ao sat = 93% PA sat = 64%, 69% PAPi = 2.7    Assessment: 1. Mild pulmonary HTN with normal PVR and normal cardiac output 2. Prominent v-waves in both RA and PCWP tracings suggestive of significant TR/MR   Plan/Discussion:    Suspect PH related to valvular disease. Will need TEE to further evaluate. There is near equalization of pressures but suspicion for post-op constriction, not overly high. cMRI pending.  Sleep study 2/23: Severe sleep apnea: OSA 45 AHI CSA 15. Pending CPAP   Here for post cath f/u. Feels much better. Fluid better. Has cut his alcohol in half. Much more active with minimal DOW. LE swelling much improved. No orthopnea or PND. No presyncope.     Past Medical History:  Diagnosis Date   A-fib ([facility]    Allergy    Seasonal--pollen   Anticoagulation goal of INR 2 to 3    on coumadin   Arrhythmia    CAD (coronary artery disease) 1999   RCA stent   Carotid stenosis, asymptomatic    moderate RT and mild LT with carotid dopplers 10/26/11   Colon cancer ([facility]  04/2006   Stage III--T3 N1   COPD (chronic obstructive pulmonary disease) ([facility]    H/O cardiovascular stress test 01/13/2010   low risk scan, similar to 2008 study   H/O echocardiogram 05/23/2009   EF >55%, mild MR, Mild TR,    Hand foot syndrome    Hemorrhoids    Hyperlipidemia    Hypertension    Neuropathy    Permanent atrial fibrillation ([facility])    Psoriasis    Tubular adenoma of colon     Current Outpatient Medications  Medication Sig Dispense Refill   Alpha Lipoic Acid 200 MG CAPS Take 600 mg by mouth daily.      amLODipine (NORVASC) 2.5 MG tablet Take 2.5 mg by mouth at bedtime.     aspirin 81 MG tablet Take 81 mg by mouth daily.     atorvastatin (LIPITOR) 80 MG tablet Take 80 mg by mouth daily before supper.     cholecalciferol (VITAMIN D) 1000 UNITS tablet Take 1,000 Units by mouth daily.     Coenzyme Q10 200 MG capsule Take 200 mg by mouth daily.      Cyanocobalamin (VITAMIN B 12 PO) Take 1 tablet by mouth daily.     ELIQUIS 5 MG TABS tablet TAKE 1 TABLET BY MOUTH TWICE DAILY. 180 tablet 1  Flaxseed, Linseed, (FLAX SEED OIL PO) Take 1 tablet by mouth daily.     furosemide (LASIX) 40 MG tablet Take 1 tablet (40 mg total) by mouth in the morning AND 1 tablet (40 mg total) every evening. 180 tablet 3   magnesium oxide (MAG-OX) 400 MG tablet Take 800 mg by mouth daily.     Omega-3 Fatty Acids (FISH OIL) 1200 MG CAPS Take 1,200 mg by mouth daily.     Polyethyl Glycol-Propyl Glycol (SYSTANE) 0.4-0.3 % GEL ophthalmic gel Place 1 application. into both eyes every 8 (eight) hours as needed (dry eyes).     pyridoxine (B-6) 200 MG tablet Take 200 mg by mouth daily.     vitamin C (ASCORBIC ACID) 500 MG tablet Take 500 mg by mouth 3 (three) times daily.      No current facility-administered medications for this encounter.    No Known Allergies    Social History   Socioeconomic History   Marital status: Married    Spouse name: [name]   Number of children: 2   Years of  education: Not on file   Highest education level: Bachelor's degree (e.g., BA, AB, BS)  Occupational History   Occupation: retired    Fish farm manager: RETIRED  Tobacco Use   Smoking status: Former    Years: 30.00    Types: Cigarettes    Quit date: 11/25/1993    Years since quitting: 27.7   Smokeless tobacco: Never  Vaping Use   Vaping Use: Never used  Substance and Sexual Activity   Alcohol use: Yes    Alcohol/week: 1.0 standard drink    Types: 1 Cans of beer per week   Drug use: No   Sexual activity: Not on file  Other Topics Concern   Not on file  Social History Narrative   Not on file   Social Determinants of Health   Financial Resource Strain: Not on file  Food Insecurity: Not on file  Transportation Needs: Not on file  Physical Activity: Not on file  Stress: Not on file  Social Connections: Not on file  Intimate Partner Violence: Not on file      Family History  Problem Relation Age of Onset   Heart disease Father 13   Heart disease Paternal Grandmother    Heart disease Mother    Hypertension Mother    Healthy Sister    Heart disease Paternal Grandfather    Hypertension Paternal Grandfather    Colon cancer Neg Hx     Vitals:   08/10/21 1148  BP: 138/60  Pulse: 61  SpO2: 94%  Weight: 100.2 kg (220 lb 12.8 oz)     PHYSICAL EXAM: General:  Well appearing. No respiratory difficulty HEENT: normal Neck: supple. JVP 7 with prominent v waves. Carotids 2+ bilat; no bruits. No lymphadenopathy or thryomegaly appreciated. Cor: PMI nondisplaced. Irregular rate & rhythm. No rubs, gallops or murmurs. Lungs: clear Abdomen: soft, nontender, nondistended. No hepatosplenomegaly. No bruits or masses. Good bowel sounds. Extremities: no cyanosis, clubbing, rash, tr-1+ edema Neuro: alert & orientedx3, cranial nerves grossly intact. moves all 4 extremities w/o difficulty. Affect pleasant  ASSESSMENT & PLAN:  1. Pulmonary HTN on echo  - Echo 10/22 LVEF >65% RV dilated with  normal function. Severe central TR RVSP 32mHG (previously 38)  - TEE at time of CABG 11/19 Normal RV. No TR - CTA chest 1/23 no PE. + evidence of RV strain   - Etiology unclear - suspect mixed picture - RHC 4/23: PA =  43/11 (26) PCW = 14 (v- waves up to 35)Fick = 6.6/2.9 PVR = 1.8 WU - Sleeps study with severe OSA/CSA - pending CPAP - VQ scan negative - Serologies negative - PFTs 3/23: FEV1 1.54 (42%) FVC 3.23 (64) FEV1/FVC 48% DLCO 46% - RHC suggests valvular disease playing significant role - Overall much improved. NYHA II. Volume status ok - Plan cMRI to assess for possible amyloid and also to look for constriction and valvular disease - Consider TEE as needed - Not candidate for pulmonary artery vasodilators with normal PVR  2. CAD s/p CABG 2019  - no s/s angina - continue ASA/statin - managed by Dr. Gwenlyn Found  3. Chronic AF - stable. Rate controlled - followed by Dr. Gwenlyn Found  - Continue Eliquis  4. COPD - consider pulmonary f/u  Glori Bickers, MD  3:20 PM

## 2021-08-10 NOTE — Patient Instructions (Signed)
Labs done today, your results will be available in MyChart, we will contact you for abnormal readings.  Your physician has requested that you have a cardiac MRI. Cardiac MRI uses a computer to create images of your heart as its beating, producing both still and moving pictures of your heart and major blood vessels. For further information please visit [email or web address] Please follow the instruction sheet given to you today for more information. ONCE APPROVED BY INSURANCE YOU WILL BE CALLED TO SCHEDULE  Your physician recommends that you schedule a follow-up appointment in: 3 months  If you have any questions or concerns before your next appointment please send [place] a message through [place] or call our office at [phone].    TO LEAVE A MESSAGE FOR THE NURSE SELECT OPTION 2, PLEASE LEAVE A MESSAGE INCLUDING: YOUR NAME DATE OF BIRTH CALL BACK NUMBER REASON FOR CALL**this is important as we prioritize the call backs  YOU WILL RECEIVE A CALL BACK THE SAME DAY AS LONG AS YOU CALL BEFORE 4:00 PM  At the [facility], you and your health needs are our priority. As part of our continuing mission to provide you with exceptional heart care, we have created designated Provider Care Teams. These Care Teams include your primary Cardiologist (physician) and Advanced Practice Providers (APPs- Physician Assistants and Nurse Practitioners) who all work together to provide you with the care you need, when you need it.   You may see any of the following providers on your designated Care Team at your next follow up: Dr Glori Bickers Dr Haynes Kerns, NP Lyda Jester, [place] [facility] [place], [place] Audry Riles, PharmD   Please be sure to bring in all your medications bottles to every appointment.

## 2021-08-11 DIAGNOSIS — I10 Essential (primary) hypertension: Secondary | ICD-10-CM | POA: Diagnosis not present

## 2021-08-11 DIAGNOSIS — G4733 Obstructive sleep apnea (adult) (pediatric): Secondary | ICD-10-CM | POA: Diagnosis not present

## 2021-08-11 DIAGNOSIS — D6869 Other thrombophilia: Secondary | ICD-10-CM | POA: Diagnosis not present

## 2021-08-11 DIAGNOSIS — I779 Disorder of arteries and arterioles, unspecified: Secondary | ICD-10-CM | POA: Diagnosis not present

## 2021-08-11 DIAGNOSIS — I272 Pulmonary hypertension, unspecified: Secondary | ICD-10-CM | POA: Diagnosis not present

## 2021-08-11 DIAGNOSIS — I482 Chronic atrial fibrillation, unspecified: Secondary | ICD-10-CM | POA: Diagnosis not present

## 2021-08-11 DIAGNOSIS — I739 Peripheral vascular disease, unspecified: Secondary | ICD-10-CM | POA: Diagnosis not present

## 2021-08-15 DIAGNOSIS — G4733 Obstructive sleep apnea (adult) (pediatric): Secondary | ICD-10-CM | POA: Diagnosis not present

## 2021-08-15 DIAGNOSIS — I10 Essential (primary) hypertension: Secondary | ICD-10-CM | POA: Diagnosis not present

## 2021-08-15 DIAGNOSIS — R0683 Snoring: Secondary | ICD-10-CM | POA: Diagnosis not present

## 2021-09-15 DIAGNOSIS — R0683 Snoring: Secondary | ICD-10-CM | POA: Diagnosis not present

## 2021-09-15 DIAGNOSIS — I10 Essential (primary) hypertension: Secondary | ICD-10-CM | POA: Diagnosis not present

## 2021-09-15 DIAGNOSIS — G4733 Obstructive sleep apnea (adult) (pediatric): Secondary | ICD-10-CM | POA: Diagnosis not present

## 2021-09-26 DIAGNOSIS — H35363 Drusen (degenerative) of macula, bilateral: Secondary | ICD-10-CM | POA: Diagnosis not present

## 2021-09-26 DIAGNOSIS — H43813 Vitreous degeneration, bilateral: Secondary | ICD-10-CM | POA: Diagnosis not present

## 2021-09-26 DIAGNOSIS — H35421 Microcystoid degeneration of retina, right eye: Secondary | ICD-10-CM | POA: Diagnosis not present

## 2021-09-26 DIAGNOSIS — H35052 Retinal neovascularization, unspecified, left eye: Secondary | ICD-10-CM | POA: Diagnosis not present

## 2021-10-15 DIAGNOSIS — R0683 Snoring: Secondary | ICD-10-CM | POA: Diagnosis not present

## 2021-10-15 DIAGNOSIS — G4733 Obstructive sleep apnea (adult) (pediatric): Secondary | ICD-10-CM | POA: Diagnosis not present

## 2021-10-15 DIAGNOSIS — I10 Essential (primary) hypertension: Secondary | ICD-10-CM | POA: Diagnosis not present

## 2021-10-17 ENCOUNTER — Ambulatory Visit: Payer: Medicare Other | Admitting: Cardiovascular Disease

## 2021-10-25 ENCOUNTER — Other Ambulatory Visit: Payer: Self-pay | Admitting: Cardiovascular Disease

## 2021-10-25 DIAGNOSIS — I4821 Permanent atrial fibrillation: Secondary | ICD-10-CM

## 2021-10-25 NOTE — Telephone Encounter (Signed)
Prescription refill request for Eliquis received. Indication: Atrial Fib Last office visit: 08/10/21  D Bensimhon MD Scr: 0.82 on 08/10/21 Age: 81 Weight: 100.2kg  Based on above findings Eliquis '5mg'$  twice daily is the appropriate dose.  Refill approved.

## 2021-10-29 NOTE — Progress Notes (Signed)
Virtual Sleep Medicine Consult Visit via Video Note   Because of [name] co-morbid illnesses, he is at least at moderate risk for complications without adequate follow up.  This format is felt to be most appropriate for this patient at this time.  All issues noted in this document were discussed and addressed.  A limited physical exam was performed with this format.  Please refer to the patient's chart for his consent to telehealth for [name].      Date:  10/30/2021   ID:  [name], DOB Apr 03, 1940, MRN [id number] The patient was identified using 2 identifiers.  Patient Location: Home Provider Location: Home Office   PCP:  Lavone Orn, [place] Providers Cardiologist:  [name], MD     Evaluation Performed:  New Patient Evaluation  Chief Complaint:  OSA  History of Present Illness:    [name] is a 81 y.o. male who is being seen today for the evaluation of OSA at the request of Glori Bickers, MD.  [name] is a 81 y.o. male with a hx of HTN, chronic afib, ASCAD, PHTN and colon CA s/p right colectomy who was referred for sleep study due to a hx of pulmonary HTN.  He tells me that he was having a lot of problems with excessive daytime sleepiness and would get very sleepy during the day and would have to nap.  He did not refreshed in the am as well.  He does not snore.  He underwent home sleep study with a WatchPat 1 device which demonstrated severe OSA with an AHI fo 45/hr and O2 sats as low as 85%. He underwent CPAP titration which was unsuccessful due to ongoing respiratory events.  He was started on auto BiPAP with IPAP max of 20cm H2O, EPAP min 5cm H2O and PS 4cm H2O.    He is doing well with his CPAP device and thinks that he has gotten used to it.  He tolerates the mask and feels the pressure is adequate.  Since going on CPAP he has not really felt any difference in how rested he feels in the am likely  related to his insomnia.   He has some problems with  mouth dryness but no nasal dryness or nasal congestion.  He does not think that he snores.      Past Medical History:  Diagnosis Date   A-fib ([place])    Allergy    Seasonal--pollen   Anticoagulation goal of INR 2 to 3    on coumadin   Arrhythmia    CAD (coronary artery disease) 1999   RCA stent   Carotid stenosis, asymptomatic    moderate RT and mild LT with carotid dopplers 10/26/11   Colon cancer ([place]) 04/2006   Stage III--T3 N1   COPD (chronic obstructive pulmonary disease) ([place])    H/O cardiovascular stress test 01/13/2010   low risk scan, similar to 2008 study   H/O echocardiogram 05/23/2009   EF >55%, mild MR, Mild TR,    Hand foot syndrome    Hemorrhoids    Hyperlipidemia    Hypertension    Neuropathy    Permanent atrial fibrillation ([place])    Psoriasis    Tubular adenoma of colon    Past Surgical History:  Procedure Laterality Date   CARDIAC CATHETERIZATION     CLIPPING OF ATRIAL APPENDAGE  01/28/2018   Procedure: CLIPPING OF ATRIAL APPENDAGE;  Surgeon: Cyndia Bent,  Fernande Boyden, MD;  Location: [facility];  Service: Open Heart Surgery;;   COLONOSCOPY     CORONARY ARTERY BYPASS GRAFT N/A 01/28/2018   Procedure: CORONARY ARTERY BYPASS GRAFTING (CABG) X 4 USING LEFT INTERNAL MAMMARY ARTERY AND LEFT GREATER SAPHENOUS VEIN HARVESTED ENDOSCOPICALLY;  Surgeon: Gaye Pollack, MD;  Location: [facility];  Service: Open Heart Surgery;  Laterality: N/A;   CORONARY STENT PLACEMENT  1999   RCA tandem NIR stents   LEFT HEART CATH AND CORONARY ANGIOGRAPHY N/A 01/23/2018   Procedure: LEFT HEART CATH AND CORONARY ANGIOGRAPHY;  Surgeon: Nelva Bush, MD;  Location: [facility];  Service: Cardiovascular;  Laterality: N/A;   RIGHT COLECTOMY  05/29/2006   Laparoscopic assisted   RIGHT HEART CATH N/A 06/14/2021   Procedure: RIGHT HEART CATH;  Surgeon: Jolaine Artist, MD;  Location: [place] [facility];  Service: Cardiovascular;  Laterality: N/A;    TEE WITHOUT CARDIOVERSION N/A 01/28/2018   Procedure: TRANSESOPHAGEAL ECHOCARDIOGRAM (TEE);  Surgeon: Gaye Pollack, MD;  Location: [place];  Service: Open Heart Surgery;  Laterality: N/A;   TONSILLECTOMY       Current Meds  Medication Sig   Alpha Lipoic Acid 200 MG CAPS Take 600 mg by mouth daily.    amLODipine (NORVASC) 2.5 MG tablet Take 2.5 mg by mouth at bedtime.   apixaban (ELIQUIS) 5 MG TABS tablet TAKE 1 TABLET BY MOUTH  TWICE DAILY   aspirin 81 MG tablet Take 81 mg by mouth daily.   atorvastatin (LIPITOR) 80 MG tablet Take 80 mg by mouth daily before supper.   cholecalciferol (VITAMIN D) 1000 UNITS tablet Take 1,000 Units by mouth daily.   Coenzyme Q10 200 MG capsule Take 200 mg by mouth daily.    Cyanocobalamin (VITAMIN B 12 PO) Take 1 tablet by mouth daily.   Flaxseed, Linseed, (FLAX SEED OIL PO) Take 1 tablet by mouth daily.   furosemide (LASIX) 40 MG tablet Take 1 tablet (40 mg total) by mouth in the morning AND 1 tablet (40 mg total) every evening.   magnesium oxide (MAG-OX) 400 MG tablet Take 800 mg by mouth daily.   Omega-3 Fatty Acids (FISH OIL) 1200 MG CAPS Take 1,200 mg by mouth daily.   Polyethyl Glycol-Propyl Glycol (SYSTANE) 0.4-0.3 % GEL ophthalmic gel Place 1 application. into both eyes every 8 (eight) hours as needed (dry eyes).   pyridoxine (B-6) 200 MG tablet Take 200 mg by mouth daily.   vitamin C (ASCORBIC ACID) 500 MG tablet Take 500 mg by mouth 3 (three) times daily.      Allergies:   Patient has no known allergies.   Social History   Tobacco Use   Smoking status: Former    Years: 30.00    Types: Cigarettes    Quit date: 11/25/1993    Years since quitting: 27.9   Smokeless tobacco: Never  Vaping Use   Vaping Use: Never used  Substance Use Topics   Alcohol use: Yes    Alcohol/week: 1.0 standard drink of alcohol    Types: 1 Cans of beer per week   Drug use: No     Family Hx: The patient's family history includes Healthy in his sister; Heart  disease in his mother, paternal grandfather, and paternal grandmother; Heart disease (age of onset: 33) in his father; Hypertension in his mother and paternal grandfather. There is no history of Colon cancer.  ROS:   Please see the history of present illness.  All other systems reviewed and are negative.   Prior Sleep studies:   The following studies were reviewed today:  PAP titration 07/18/2021 IMPRESSIONS - An optimal PAP pressure was selected for this patient ( 22/18 cm of water) - Central sleep apnea was not noted during this titration (CAI = 4.6/h). - Mild oxygen desaturations were observed during this titration (min O2 = 87.0%). - The patient snored with soft snoring volume. - No cardiac abnormalities were observed during this study. - Clinically significant periodic limb movements were not noted during this study. Arousals associated with PLMs were rare.   DIAGNOSIS - Obstructive Sleep Apnea (G47.33)   RECOMMENDATIONS - Trial of auto BiPAP therapy with IPAP max 20cm H2O, EPAP min 5cm H2O and PS 4cm H2O with a Medium size Fisher[employer]Paykel Full Face Simplus mask and heated humidification. - Avoid alcohol, sedatives and other CNS depressants that may worsen sleep apnea and disrupt normal sleep architecture. - Sleep hygiene should be reviewed to assess factors that may improve sleep quality. - Weight management and regular exercise should be initiated or continued. - Return to Sleep Center for re-evaluation after 6 weeks of therapy  Home Sleep Study  06/04/2021 FINDINGS: 1. Severe Obstructive Sleep Apnea with AHI 45 /hr. 2. Mild Central Sleep Apnea with 15/hr. 3. Oxygen desaturations as low as 85%. 4. Minimal snoring was present. O2 sats were < 88% for 3.1 min. 5. Total sleep time was 5 hrs and 28 min. 6. 20% of total sleep time was spent in REM sleep. 7. Normal sleep onset latency at 15 min. 8. Shortened REM sleep onset latency at 54 min. 9. Total awakenings were 4.    DIAGNOSIS: Severe Obstructive Sleep Apnea (G47.33) Central Sleep Apnea  Labs/Other Tests and Data Reviewed:     Recent Labs: 12/27/2020: NT-Pro BNP 312 05/31/2021: ALT 38; TSH 4.317 08/10/2021: B Natriuretic Peptide 124.9; BUN 19; Creatinine, Ser 0.82; Hemoglobin 13.0; Platelets 156; Potassium 4.6; Sodium 139    Wt Readings from Last 3 Encounters:  10/30/21 220 lb (99.8 kg)  08/10/21 220 lb 12.8 oz (100.2 kg)  07/18/21 230 lb (104.3 kg)     Risk Assessment/Calculations:          Objective:    Vital Signs:  BP 139/67   Pulse (!) 57   Ht '6\' 2"'$  (1.88 m)   Wt 220 lb (99.8 kg)   BMI 28.25 kg/m    VITAL SIGNS:  reviewed GEN:  no acute distress EYES:  sclerae anicteric, EOMI - Extraocular Movements Intact RESPIRATORY:  normal respiratory effort, symmetric expansion CARDIOVASCULAR:  no peripheral edema SKIN:  no rash, lesions or ulcers. MUSCULOSKELETAL:  no obvious deformities. NEURO:  alert and oriented x 3, no obvious focal deficit PSYCH:  normal affect  ASSESSMENT & PLAN:    OSA - The patient is tolerating PAP therapy well without any problems. The PAP download performed by his DME was personally reviewed and interpreted by me today and showed an AHI of 4.6/hr on auto CPAP cm H2O with 77% compliance in using more than 4 hours nightly.  The patient has been using and benefiting from PAP use and will continue to benefit from therapy.  -check ONO pulse ox on PAP therapy  INSOMNIA -he has had problems for most of his life with getting to sleep and staying asleep -he averages about 5 hours of sleep nightly -he has tried melatonin but no other sleep aides -Given his advanced age, I have asked him to talk with his  PCP about safe alternatives for sleep  HTN -BP controlled on exam today -continue prescription drug therapy with Amlodipine 2.'5mg'$  daily with PRN refills   Time:   Today, I have spent 15 minutes with the patient with telehealth technology discussing the above  problems.     Medication Adjustments/Labs and Tests Ordered: Current medicines are reviewed at length with the patient today.  Concerns regarding medicines are outlined above.   Tests Ordered: No orders of the defined types were placed in this encounter.   Medication Changes: No orders of the defined types were placed in this encounter.   Follow Up:   in 1 year(s)  Signed, Fransico Him, MD  10/30/2021 8:42 AM    [place]

## 2021-10-30 ENCOUNTER — Telehealth (INDEPENDENT_AMBULATORY_CARE_PROVIDER_SITE_OTHER): Payer: Medicare Other | Admitting: Cardiology

## 2021-10-30 ENCOUNTER — Encounter: Payer: Self-pay | Admitting: Cardiology

## 2021-10-30 VITALS — BP 139/67 | HR 57 | Ht 74.0 in | Wt 220.0 lb

## 2021-10-30 DIAGNOSIS — I10 Essential (primary) hypertension: Secondary | ICD-10-CM | POA: Diagnosis not present

## 2021-10-30 DIAGNOSIS — G4709 Other insomnia: Secondary | ICD-10-CM

## 2021-10-30 DIAGNOSIS — G4733 Obstructive sleep apnea (adult) (pediatric): Secondary | ICD-10-CM | POA: Diagnosis not present

## 2021-10-30 NOTE — Addendum Note (Signed)
Addended by: Antonieta Iba on: 10/30/2021 08:49 AM   Modules accepted: Orders

## 2021-10-30 NOTE — Patient Instructions (Signed)
Medication Instructions:  Your physician recommends that you continue on your current medications as directed. Please refer to the Current Medication list given to you today.  *If you need a refill on your cardiac medications before your next appointment, please call your pharmacy*  Testing/Procedures: Overnight pulse oximeter on CPAP  Follow-Up: At [facility], you and your health needs are our priority.  As part of our continuing mission to provide you with exceptional heart care, we have created designated Provider Care Teams.  These Care Teams include your primary Cardiologist (physician) and Advanced Practice Providers (APPs -  Physician Assistants and Nurse Practitioners) who all work together to provide you with the care you need, when you need it.  Your next appointment:   1 year(s)  The format for your next appointment:   In Person  Provider:   Fransico Him, MD   Important Information About Sugar

## 2021-10-31 ENCOUNTER — Encounter: Payer: Self-pay | Admitting: Cardiovascular Disease

## 2021-10-31 ENCOUNTER — Ambulatory Visit: Payer: Medicare Other | Admitting: Cardiovascular Disease

## 2021-10-31 DIAGNOSIS — I4811 Longstanding persistent atrial fibrillation: Secondary | ICD-10-CM | POA: Diagnosis not present

## 2021-10-31 DIAGNOSIS — E785 Hyperlipidemia, unspecified: Secondary | ICD-10-CM

## 2021-10-31 DIAGNOSIS — I2510 Atherosclerotic heart disease of native coronary artery without angina pectoris: Secondary | ICD-10-CM

## 2021-10-31 DIAGNOSIS — Z9861 Coronary angioplasty status: Secondary | ICD-10-CM | POA: Diagnosis not present

## 2021-10-31 DIAGNOSIS — G4733 Obstructive sleep apnea (adult) (pediatric): Secondary | ICD-10-CM

## 2021-10-31 DIAGNOSIS — I272 Pulmonary hypertension, unspecified: Secondary | ICD-10-CM

## 2021-10-31 DIAGNOSIS — R600 Localized edema: Secondary | ICD-10-CM | POA: Diagnosis not present

## 2021-10-31 DIAGNOSIS — I6523 Occlusion and stenosis of bilateral carotid arteries: Secondary | ICD-10-CM

## 2021-10-31 DIAGNOSIS — I10 Essential (primary) hypertension: Secondary | ICD-10-CM

## 2021-10-31 MED ORDER — METOLAZONE 2.5 MG PO TABS: ORAL_TABLET | ORAL | 3 refills | Status: DC

## 2021-10-31 NOTE — Assessment & Plan Note (Signed)
History of pulmonary hypertension which is mild by right heart cath performed Dr. Britta Mccreedy on 06/14/2021 although he did have high TR waves and V waves on wedge tracings with MR and TR.

## 2021-10-31 NOTE — Patient Instructions (Signed)
Medication Instructions:   -Start taking metolazone (zaroxolyn) 2.'5mg'$  once daily, 30 minutes prior to morning dose of lasix, then 2.'5mg'$  every other day, 30 minutes prior to lasix dose.  *If you need a refill on your cardiac medications before your next appointment, please call your pharmacy*   Lab Work: Your physician recommends that you return for lab work in: 10 days for BMET  If you have labs (blood work) drawn today and your tests are completely normal, you will receive your results only by: [place] (if you have MyChart) OR A paper copy in the mail If you have any lab test that is abnormal or we need to change your treatment, we will call you to review the results.   Follow-Up: At [facility], you and your health needs are our priority.  As part of our continuing mission to provide you with exceptional heart care, we have created designated Provider Care Teams.  These Care Teams include your primary Cardiologist (physician) and Advanced Practice Providers (APPs -  Physician Assistants and Nurse Practitioners) who all work together to provide you with the care you need, when you need it.  We recommend signing up for the patient portal called "MyChart".  Sign up information is provided on this After Visit Summary.  MyChart is used to connect with patients for Virtual Visits (Telemedicine).  Patients are able to view lab/test results, encounter notes, upcoming appointments, etc.  Non-urgent messages can be sent to your provider as well.   To learn more about what you can do with MyChart, go to [email or web address].    Your next appointment:   6 week(s)  The format for your next appointment:   In Person  Provider:   [name], MD

## 2021-10-31 NOTE — Assessment & Plan Note (Signed)
Complains of significant lifestyle-limiting claudication with lower extremity Doppler studies performed 04/03/2021 revealing 90 compressible ABIs with high-frequency signals in bilateral iliac arteries right greater than left.  He wishes to pursue peripheral angiography and endovascular therapy but we will wait until he is more hemodynamically stable and euvolemic.

## 2021-10-31 NOTE — Assessment & Plan Note (Signed)
History of essential hypertension blood pressure measured today at 122/58.  He is on amlodipine.

## 2021-10-31 NOTE — Assessment & Plan Note (Signed)
On BiPAP per Dr. Radford Pax

## 2021-10-31 NOTE — Assessment & Plan Note (Signed)
History of dyslipidemia on statin therapy with lipid profile performed 01/06/2021 revealing total cholesterol 110, LDL 45 and HDL of 49.

## 2021-10-31 NOTE — Assessment & Plan Note (Signed)
History of persistent A-fib with a slow ventricular response on Eliquis oral anticoagulation.

## 2021-10-31 NOTE — Assessment & Plan Note (Signed)
2-3+ lower extremity edema on furosemide 40 mg p.o. twice daily.  His weight has gone up 15 pounds in the last 6 months.  His renal function is stable.  I am going to put him on Zaroxolyn 2.5 mg every morning prior to his furosemide dose for 1 week after that every other day.  We will check a basic metabolic panel in 7 to 10 days.

## 2021-10-31 NOTE — Assessment & Plan Note (Signed)
History of moderate bilateral ICA stenosis by duplex ultrasound 12/29/2020.  This will be repeated on an annual basis.

## 2021-10-31 NOTE — Assessment & Plan Note (Signed)
History of CAD status post RCA stenting by myself 01/11/1998.  He had non-STEMI 01/21/2018 underwent cardiac catheterization and ultimately CABG by Dr. Cyndia Bent 01/28/2018 with a LIMA to his LAD, vein to an obtuse marginal branch, PDA and PLA sequentially.  He denies chest pain.

## 2021-10-31 NOTE — Progress Notes (Signed)
10/31/2021 [name]   09-12-1940  [id number]  Primary Physician Lavone Orn, MD Primary Cardiologist: Lorretta Harp MD FACP, [place], [place], [place]  HPI:  [name] is a 81 y.o.  mildly overweight, married Caucasian male father of 2, grandfather of 3 grandchildren who I saw  in the office 07/04/2021.[name] He has a history of CAD status post RCA stenting by myself January 11, 1998. He had normal circumflex, LAD and ejection fraction. His other problems include hypertension and chronic atrial fibrillation on Coumadin anticoagulation, rate controlled, as well as erectile dysfunction. He is totally asymptomatic. He did have colon cancer picked up on screening colonoscopy and underwent right colectomy May 29, 2006 followed by 11 cycles of adjuvant Folfox chemotherapy. [name] He is very active exercises 6 days a week doing weight training 3 days a week, and lower extremity training 3 times a week.[name]   He was admitted to the hospital on 01/21/2018 with non-STEMI.  He underwent cardiac catheterization 2 days later by Dr. Saunders Revel revealing left main disease but disease in his LAD and RCA as well.  He underwent CABG by Dr. Cyndia Bent on 01/28/2018 with a LIMA to his LAD, vein to an obtuse marginal branch and to the PDA and PLA sequentially.  He also had left atrial clipping at that time.    He ended up completing cardiac rehab as an outpatient.   He saw [name], NP in the office 12/14/2020 who placed him on a low-dose diuretic and obtain a 2D echocardiogram which was performed 12/20/2020 revealing severe TR with pulmonary hypertension which is new for him.  He is fairly active but does complain of dyspnea on exertion which is a new symptom.     His symptoms of dyspnea significantly improved with the addition of a diuretic.  I did go chest CTA that showed no evidence of thromboembolic disease.  In addition, I did lower extremity arterial Doppler studies that show significant iliac disease bilaterally.  He  does complain of lifestyle limiting claudication.  He had a right heart cath performed by Dr. Haroldine Laws 06/14/2021 that showed mild pulmonary hypertension although he had V waves in both the right atrium and the wedge positions suggesting MR and TR.  He had a sleep study that showed severe obstructive sleep apnea.  He does have an appointment with Dr. Haroldine Laws at the end of May to discuss the right heart findings.  He may need a cardiac MRI and a TEE to further evaluate.  A chest CTA was negative for pulmonary thromboembolic disease as well as a VQ scan.  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  Since I saw him 4 months ago he has gained 15 pounds or so and has 2-3+ pitting edema.  He has increased dyspnea on exertion.  He cannot attribute this to anything in particular.  He denies chest pain.  He is limited by claudication.     Current Meds  Medication Sig   Alpha Lipoic Acid 200 MG CAPS Take 600 mg by mouth daily.    amLODipine (NORVASC) 2.5 MG tablet Take 2.5 mg by mouth at bedtime.   apixaban (ELIQUIS) 5 MG TABS tablet TAKE 1 TABLET BY MOUTH  TWICE DAILY   aspirin 81 MG tablet Take 81 mg by mouth daily.   atorvastatin (LIPITOR) 80 MG tablet Take 80 mg by mouth  daily before supper.   cholecalciferol (VITAMIN D) 1000 UNITS tablet Take 1,000 Units by mouth daily.   Coenzyme Q10 200 MG capsule Take 200 mg by mouth daily.    Cyanocobalamin (VITAMIN B 12 PO) Take 1 tablet by mouth daily.   Flaxseed, Linseed, (FLAX SEED OIL PO) Take 1 tablet by mouth daily.   furosemide (LASIX) 40 MG tablet Take 1 tablet (40 mg total) by mouth in the morning AND 1 tablet (40 mg total) every evening.   magnesium oxide (MAG-OX) 400 MG tablet Take 800 mg by mouth daily.   Omega-3 Fatty Acids (FISH OIL) 1200 MG CAPS Take 1,200 mg by mouth daily.   Polyethyl Glycol-Propyl Glycol  (SYSTANE) 0.4-0.3 % GEL ophthalmic gel Place 1 application. into both eyes every 8 (eight) hours as needed (dry eyes).   pyridoxine (B-6) 200 MG tablet Take 200 mg by mouth daily.   vitamin C (ASCORBIC ACID) 500 MG tablet Take 500 mg by mouth 3 (three) times daily.      No Known Allergies  Social History   Socioeconomic History   Marital status: Married    Spouse name: [name]   Number of children: 2   Years of education: Not on file   Highest education level: Bachelor's degree (e.g., BA, AB, BS)  Occupational History   Occupation: retired    Fish farm manager: RETIRED  Tobacco Use   Smoking status: Former    Years: 30.00    Types: Cigarettes    Quit date: 11/25/1993    Years since quitting: 27.9   Smokeless tobacco: Never  Vaping Use   Vaping Use: Never used  Substance and Sexual Activity   Alcohol use: Yes    Alcohol/week: 1.0 standard drink of alcohol    Types: 1 Cans of beer per week   Drug use: No   Sexual activity: Not on file  Other Topics Concern   Not on file  Social History Narrative   Not on file   Social Determinants of Health   Financial Resource Strain: Low Risk  (04/08/2018)   Overall Financial Resource Strain (CARDIA)    Difficulty of Paying Living Expenses: Not hard at all  Food Insecurity: No Food Insecurity (04/08/2018)   Hunger Vital Sign    Worried About Running Out of Food in the Last Year: Never true    Ran Out of Food in the Last Year: Never true  Transportation Needs: No Transportation Needs (04/08/2018)   PRAPARE - Hydrologist (Medical): No    Lack of Transportation (Non-Medical): No  Physical Activity: Unknown (04/08/2018)   Exercise Vital Sign    Days of Exercise per Week: 3 days    Minutes of Exercise per Session: Not asked  Stress: Stress Concern Present (04/08/2018)   [place]    Feeling of Stress : To some extent  Social Connections: Not on file   Intimate Partner Violence: Not on file     Review of Systems: General: negative for chills, fever, night sweats or weight changes.  Cardiovascular: negative for chest pain, dyspnea on exertion, edema, orthopnea, palpitations, paroxysmal nocturnal dyspnea or shortness of breath Dermatological: negative for rash Respiratory: negative for cough or wheezing Urologic: negative for hematuria Abdominal: negative for nausea, vomiting, diarrhea, bright red blood per rectum, melena, or hematemesis Neurologic: negative for visual changes, syncope, or dizziness All other systems reviewed and are otherwise negative except as noted above.    Blood pressure [name])  122/58, pulse (!) 53, height '6\' 2"'$  (1.88 m), weight 235 lb 3.2 oz (106.7 kg), SpO2 94 %.  General appearance: alert and no distress Neck: no adenopathy, no carotid bruit, no JVD, supple, symmetrical, trachea midline, and thyroid not enlarged, symmetric, no tenderness/mass/nodules Lungs: clear to auscultation bilaterally Heart: regular rate and rhythm, S1, S2 normal, no murmur, click, rub or gallop Extremities: 2-3+ pitting edema bilaterally Pulses: Diminished pedal pulses Skin: Skin color, texture, turgor normal. No rashes or lesions Neurologic: Grossly normal  EKG atrial fibrillation with a slow ventricular sponsor 53, right bundle branch block and left anterior fascicular block (bifascicular block).  I personally reviewed this EKG.  ASSESSMENT AND PLAN:   Essential hypertension History of essential hypertension blood pressure measured today at 122/58.  He is on amlodipine.  Atrial fibrillation ([place]) History of persistent A-fib with a slow ventricular response on Eliquis oral anticoagulation.  Carotid stenosis, asymptomatic History of moderate bilateral ICA stenosis by duplex ultrasound 12/29/2020.  This will be repeated on an annual basis.  CAD S/P percutaneous coronary angioplasty History of CAD status post RCA stenting by myself  01/11/1998.  He had non-STEMI 01/21/2018 underwent cardiac catheterization and ultimately CABG by Dr. Cyndia Bent 01/28/2018 with a LIMA to his LAD, vein to an obtuse marginal branch, PDA and PLA sequentially.  He denies chest pain.  Dyslipidemia, goal LDL below 70 History of dyslipidemia on statin therapy with lipid profile performed 01/06/2021 revealing total cholesterol 110, LDL 45 and HDL of 49.  Edema of leg 2-3+ lower extremity edema on furosemide 40 mg p.o. twice daily.  His weight has gone up 15 pounds in the last 6 months.  His renal function is stable.  I am going to put him on Zaroxolyn 2.5 mg every morning prior to his furosemide dose for 1 week after that every other day.  We will check a basic metabolic panel in 7 to 10 days.  Pulmonary hypertension, unspecified ([place]) History of pulmonary hypertension which is mild by right heart cath performed Dr. Britta Mccreedy on 06/14/2021 although he did have high TR waves and V waves on wedge tracings with MR and TR.  Obstructive sleep apnea On BiPAP per Dr. Radford Pax  Peripheral arterial disease ([place]) Complains of significant lifestyle-limiting claudication with lower extremity Doppler studies performed 04/03/2021 revealing 90 compressible ABIs with high-frequency signals in bilateral iliac arteries right greater than left.  He wishes to pursue peripheral angiography and endovascular therapy but we will wait until he is more hemodynamically stable and euvolemic.     Lorretta Harp MD FACP,FACC,FAHA, [facility] 10/31/2021 1:59 PM

## 2021-11-09 DIAGNOSIS — I10 Essential (primary) hypertension: Secondary | ICD-10-CM | POA: Diagnosis not present

## 2021-11-09 DIAGNOSIS — I6523 Occlusion and stenosis of bilateral carotid arteries: Secondary | ICD-10-CM | POA: Diagnosis not present

## 2021-11-09 DIAGNOSIS — I4811 Longstanding persistent atrial fibrillation: Secondary | ICD-10-CM | POA: Diagnosis not present

## 2021-11-09 DIAGNOSIS — I2510 Atherosclerotic heart disease of native coronary artery without angina pectoris: Secondary | ICD-10-CM | POA: Diagnosis not present

## 2021-11-09 DIAGNOSIS — Z9861 Coronary angioplasty status: Secondary | ICD-10-CM | POA: Diagnosis not present

## 2021-11-10 LAB — BASIC METABOLIC PANEL
BUN/Creatinine Ratio: 31 — ABNORMAL HIGH (ref 10–24)
BUN: 23 mg/dL (ref 8–27)
CO2: 24 mmol/L (ref 20–29)
Calcium: 9.4 mg/dL (ref 8.6–10.2)
Chloride: 93 mmol/L — ABNORMAL LOW (ref 96–106)
Creatinine, Ser: 0.74 mg/dL — ABNORMAL LOW (ref 0.76–1.27)
Glucose: 115 mg/dL — ABNORMAL HIGH (ref 70–99)
Potassium: 3.6 mmol/L (ref 3.5–5.2)
Sodium: 135 mmol/L (ref 134–144)
eGFR: 93 mL/min/{1.73_m2} (ref >59)

## 2021-11-13 DIAGNOSIS — H35373 Puckering of macula, bilateral: Secondary | ICD-10-CM | POA: Diagnosis not present

## 2021-11-13 DIAGNOSIS — H3322 Serous retinal detachment, left eye: Secondary | ICD-10-CM | POA: Diagnosis not present

## 2021-11-13 DIAGNOSIS — H26492 Other secondary cataract, left eye: Secondary | ICD-10-CM | POA: Diagnosis not present

## 2021-11-13 DIAGNOSIS — H04123 Dry eye syndrome of bilateral lacrimal glands: Secondary | ICD-10-CM | POA: Diagnosis not present

## 2021-11-15 DIAGNOSIS — I10 Essential (primary) hypertension: Secondary | ICD-10-CM | POA: Diagnosis not present

## 2021-11-15 DIAGNOSIS — R0683 Snoring: Secondary | ICD-10-CM | POA: Diagnosis not present

## 2021-11-15 DIAGNOSIS — G4733 Obstructive sleep apnea (adult) (pediatric): Secondary | ICD-10-CM | POA: Diagnosis not present

## 2021-11-16 ENCOUNTER — Ambulatory Visit (HOSPITAL_COMMUNITY)
Admission: RE | Admit: 2021-11-16 | Discharge: 2021-11-16 | Disposition: A | Discharge status: Home / Self Care | Payer: Medicare Other | Source: Ambulatory Visit | Attending: Internal Medicine | Admitting: Internal Medicine

## 2021-11-16 ENCOUNTER — Encounter (HOSPITAL_COMMUNITY): Payer: Self-pay | Admitting: Internal Medicine

## 2021-11-16 VITALS — BP 108/50 | HR 42 | Wt 218.6 lb

## 2021-11-16 DIAGNOSIS — Z87891 Personal history of nicotine dependence: Secondary | ICD-10-CM | POA: Insufficient documentation

## 2021-11-16 DIAGNOSIS — Z951 Presence of aortocoronary bypass graft: Secondary | ICD-10-CM | POA: Diagnosis not present

## 2021-11-16 DIAGNOSIS — E669 Obesity, unspecified: Secondary | ICD-10-CM | POA: Diagnosis not present

## 2021-11-16 DIAGNOSIS — I5032 Chronic diastolic (congestive) heart failure: Secondary | ICD-10-CM | POA: Diagnosis not present

## 2021-11-16 DIAGNOSIS — I272 Pulmonary hypertension, unspecified: Secondary | ICD-10-CM | POA: Insufficient documentation

## 2021-11-16 DIAGNOSIS — J449 Chronic obstructive pulmonary disease, unspecified: Secondary | ICD-10-CM | POA: Insufficient documentation

## 2021-11-16 DIAGNOSIS — I2510 Atherosclerotic heart disease of native coronary artery without angina pectoris: Secondary | ICD-10-CM | POA: Insufficient documentation

## 2021-11-16 DIAGNOSIS — I482 Chronic atrial fibrillation, unspecified: Secondary | ICD-10-CM | POA: Insufficient documentation

## 2021-11-16 DIAGNOSIS — R0600 Dyspnea, unspecified: Secondary | ICD-10-CM | POA: Insufficient documentation

## 2021-11-16 DIAGNOSIS — I10 Essential (primary) hypertension: Secondary | ICD-10-CM | POA: Insufficient documentation

## 2021-11-16 DIAGNOSIS — Z9861 Coronary angioplasty status: Secondary | ICD-10-CM | POA: Diagnosis not present

## 2021-11-16 DIAGNOSIS — Z7901 Long term (current) use of anticoagulants: Secondary | ICD-10-CM | POA: Insufficient documentation

## 2021-11-16 DIAGNOSIS — G4733 Obstructive sleep apnea (adult) (pediatric): Secondary | ICD-10-CM | POA: Insufficient documentation

## 2021-11-16 DIAGNOSIS — Z955 Presence of coronary angioplasty implant and graft: Secondary | ICD-10-CM | POA: Diagnosis not present

## 2021-11-16 DIAGNOSIS — Z79899 Other long term (current) drug therapy: Secondary | ICD-10-CM | POA: Insufficient documentation

## 2021-11-16 DIAGNOSIS — I4891 Unspecified atrial fibrillation: Secondary | ICD-10-CM

## 2021-11-16 DIAGNOSIS — Z7982 Long term (current) use of aspirin: Secondary | ICD-10-CM | POA: Insufficient documentation

## 2021-11-16 LAB — CBC
HCT: 36.9 % — ABNORMAL LOW (ref 39.0–52.0)
Hemoglobin: 12.8 g/dL — ABNORMAL LOW (ref 13.0–17.0)
MCH: 30.6 pg (ref 26.0–34.0)
MCHC: 34.7 g/dL (ref 30.0–36.0)
MCV: 88.3 fL (ref 80.0–100.0)
Platelets: 130 10*3/uL — ABNORMAL LOW (ref 150–400)
RBC: 4.18 MIL/uL — ABNORMAL LOW (ref 4.22–5.81)
RDW: 14.5 % (ref 11.5–15.5)
WBC: 6.9 10*3/uL (ref 4.0–10.5)
nRBC: 0 % (ref 0.0–0.2)

## 2021-11-16 LAB — BRAIN NATRIURETIC PEPTIDE: B Natriuretic Peptide: 89.6 pg/mL (ref 0.0–100.0)

## 2021-11-16 MED ORDER — METOLAZONE 2.5 MG PO TABS: 2.5000 mg | ORAL_TABLET | ORAL | Status: DC

## 2021-11-16 MED ORDER — SPIRONOLACTONE 25 MG PO TABS: 12.5000 mg | ORAL_TABLET | Freq: Every day | ORAL | 6 refills | Status: DC

## 2021-11-16 NOTE — Patient Instructions (Signed)
Medication Changes:  STOP Amlodipine  DECREASE Metolazone to only Mondays and Fridays  START Spironolactone 12.5 mg (1/2 tab) Daily  Lab Work:  Labs done today, your results will be available in MyChart, we will contact you for abnormal readings.  Your physician recommends that you return for lab work in: 2 weeks  Testing/Procedures:  Your physician has requested that you have a cardiac MRI. Cardiac MRI uses a computer to create images of your heart as its beating, producing both still and moving pictures of your heart and major blood vessels. For further information please visit [email or web address] Please follow the instruction sheet given to you today for more information. Radiology will call you to schedule this test.  Referrals:  none  Special Instructions // Education:  Do the following things EVERYDAY: Weigh yourself in the morning before breakfast. Write it down and keep it in a log. Take your medicines as prescribed Eat low salt foods--Limit salt (sodium) to 2000 mg per day.  Stay as active as you can everyday Limit all fluids for the day to less than 2 liters   Follow-Up in: 4 months  At the [facility], you and your health needs are our priority. We have a designated team specialized in the treatment of Heart Failure. This Care Team includes your primary Heart Failure Specialized Cardiologist (physician), Advanced Practice Providers (APPs- Physician Assistants and Nurse Practitioners), and Pharmacist who all work together to provide you with the care you need, when you need it.   You may see any of the following providers on your designated Care Team at your next follow up:  Dr Glori Bickers Dr Haynes Kerns, NP Lyda Jester, [place] [facility] [place], [place] Audry Riles, PharmD   Please be sure to bring in all your medications bottles to every appointment.   Need to Contact us:  If you have any questions or  concerns before your next appointment please send [place] a message through [place] or call our office at [phone].    TO LEAVE A MESSAGE FOR THE NURSE SELECT OPTION 2, PLEASE LEAVE A MESSAGE INCLUDING: YOUR NAME DATE OF BIRTH CALL BACK NUMBER REASON FOR CALL**this is important as we prioritize the call backs  YOU WILL RECEIVE A CALL BACK THE SAME DAY AS LONG AS YOU CALL BEFORE 4:00 PM

## 2021-11-16 NOTE — Progress Notes (Signed)
ADVANCED HF CLINIC NOTE  Referring Physician:Jonathan Gwenlyn Found, MD  Primary Care: Lavone Orn, MD Primary Cardiologist: [name], MD   HPI:  [name] is a 81 y.o. [place] male with obesity, HTN, chronic AF, CAD s/p RCA stenting in 1999 and CABG 11/19, PAD, colonCA s/p right colectomy 3/08 followed by 11 cycles of adjuvant Folfox chemotherapy. Referred by Dr. 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  Quit smoking 25 years ago   Echo 10/22 LVEF >65% RV dilated with normal function. Severe central TR RVSP 88mHG (previously 38)   TEE at time of CABG 11/19 Normal RV. No TR  CTA chest no PE. + evidence of RV strain    VQ 4/23 No clots  PFTs 3/23: FEV1 1.54 (42%) FVC 3.23 (64) FEV1/FVC 48% DLCO 46%  RA = 12 (v- waves to 23) RV = 40/14 PA = 43/11 (26) PCW = 14 (v- waves up to 35) Fick cardiac output/index = 6.6/2.9 PVR = 1.8 WU Ao sat = 93% PA sat = 64%, 69% PAPi = 2.7    Assessment: 1. Mild pulmonary HTN with normal PVR and normal cardiac output 2. Prominent v-waves in both RA and PCWP tracings suggestive of significant TR/MR   Plan/Discussion:    Suspect PH related to valvular disease. Will need TEE to further evaluate. There is near equalization of pressures but suspicion for post-op constriction, not overly high. cMRI pending.  Sleep study 2/23: Severe sleep apnea: OSA 45 AHI CSA 15. Saw Dr. TRadford Pax-> now CPAP.   Here for f/u. At last visit cMRI ordered to look for amyloid. but not completed yet. Feeling much, much better. Walking 5k steps per day. No edema, orthopnea or PND. Remains in slow AF. HR 39-105 (mostly 60s during the day). No syncope, presyncope. Started on metolazone qoD by Dr. BGwenlyn Foundabout 2 weeks ago. Edema much better. Lost 17 pounds of fluid     Past Medical History:  Diagnosis Date   [place]-fib ([place]    Allergy    Seasonal--pollen   Anticoagulation goal of INR 2 to 3    on coumadin    Arrhythmia    CAD (coronary artery disease) 1999   RCA stent   Carotid stenosis, asymptomatic    moderate RT and mild LT with carotid dopplers 10/26/11   Colon cancer ([place] 04/2006   Stage III--T3 N1   COPD (chronic obstructive pulmonary disease) ([place]    H/O cardiovascular stress test 01/13/2010   low risk scan, similar to 2008 study   H/O echocardiogram 05/23/2009   EF >55%, mild MR, Mild TR,    Hand foot syndrome    Hemorrhoids    Hyperlipidemia    Hypertension    Neuropathy    Permanent atrial fibrillation (H[facility])    Psoriasis    Tubular adenoma of colon     Current Outpatient Medications  Medication Sig Dispense Refill   Alpha-Lipoic Acid 600 MG TABS Take 600 mg by mouth 2 (two) times daily.     amLODipine (NORVASC) 2.5 MG tablet Take 2.5 mg by mouth at bedtime.     apixaban (ELIQUIS) 5 MG TABS tablet TAKE 1 TABLET BY MOUTH  TWICE DAILY 180 tablet 1   aspirin 81 MG tablet Take 81 mg by mouth daily.     atorvastatin (LIPITOR) 80 MG tablet Take 80 mg by mouth daily before supper.     cholecalciferol (VITAMIN D) 1000 UNITS tablet Take 1,000 Units by  mouth daily.     Coenzyme Q10 200 MG capsule Take 200 mg by mouth daily.      Cyanocobalamin (VITAMIN B 12 PO) Take 1 tablet by mouth daily.     Flaxseed, Linseed, (FLAX SEED OIL PO) Take 1 tablet by mouth daily.     furosemide (LASIX) 40 MG tablet Take 1 tablet (40 mg total) by mouth in the morning AND 1 tablet (40 mg total) every evening. 180 tablet 3   Magnesium Citrate 200 MG TABS Take 2 tablets by mouth daily in the afternoon.     metolazone (ZAROXOLYN) 2.5 MG tablet Take 2.5 mg by mouth every other day.     Polyethyl Glycol-Propyl Glycol (SYSTANE) 0.4-0.3 % GEL ophthalmic gel Place 1 application. into both eyes every 8 (eight) hours as needed (dry eyes).     pyridoxine (B-6) 200 MG tablet Take 200 mg by mouth daily.     vitamin C (ASCORBIC ACID) 500 MG tablet Take 500 mg by mouth 3 (three) times daily.      No current  facility-administered medications for this encounter.    No Known Allergies    Social History   Socioeconomic History   Marital status: Married    Spouse name: [name]   Number of children: 2   Years of education: Not on file   Highest education level: Bachelor's degree (e.g., BA, AB, BS)  Occupational History   Occupation: retired    Fish farm manager: RETIRED  Tobacco Use   Smoking status: Former    Years: 30.00    Types: Cigarettes    Quit date: 11/25/1993    Years since quitting: 27.9   Smokeless tobacco: Never  Vaping Use   Vaping Use: Never used  Substance and Sexual Activity   Alcohol use: Yes    Alcohol/week: 1.0 standard drink of alcohol    Types: 1 Cans of beer per week   Drug use: No   Sexual activity: Not on file  Other Topics Concern   Not on file  Social History Narrative   Not on file   Social Determinants of Health   Financial Resource Strain: Low Risk  (04/08/2018)   Overall Financial Resource Strain (CARDIA)    Difficulty of Paying Living Expenses: Not hard at all  Food Insecurity: No Food Insecurity (04/08/2018)   Hunger Vital Sign    Worried About Running Out of Food in the Last Year: Never true    Ran Out of Food in the Last Year: Never true  Transportation Needs: No Transportation Needs (04/08/2018)   PRAPARE - Hydrologist (Medical): No    Lack of Transportation (Non-Medical): No  Physical Activity: Unknown (04/08/2018)   Exercise Vital Sign    Days of Exercise per Week: 3 days    Minutes of Exercise per Session: Not asked  Stress: Stress Concern Present (04/08/2018)   [place]    Feeling of Stress : To some extent  Social Connections: Not on file  Intimate Partner Violence: Not on file      Family History  Problem Relation Age of Onset   Heart disease Father 51   Heart disease Paternal Grandmother    Heart disease Mother    Hypertension Mother     Healthy Sister    Heart disease Paternal Grandfather    Hypertension Paternal Grandfather    Colon cancer Neg Hx     Vitals:   11/16/21 1007  BP: [name])  108/50  Pulse: (!) 42  SpO2: 97%  Weight: 99.2 kg (218 lb 9.6 oz)     PHYSICAL EXAM: General:  Well appearing. No resp difficulty HEENT: normal Neck: supple. no JVD. Carotids 2+ bilat; no bruits. No lymphadenopathy or thryomegaly appreciated. Cor: PMI nondisplaced. Irregular brady  No rubs, gallops or murmurs. Lungs: clear Abdomen: soft, nontender, nondistended. No hepatosplenomegaly. No bruits or masses. Good bowel sounds. Extremities: no cyanosis, clubbing, rash, tr edema + support stockings Neuro: alert & orientedx3, cranial nerves grossly intact. moves all 4 extremities w/o difficulty. Affect pleasant   ASSESSMENT & PLAN:  1. Pulmonary HTN on echo  - Echo 10/22 LVEF >65% RV dilated with normal function. Severe central TR RVSP 3mHG (previously 38)  - TEE at time of CABG 11/19 Normal RV. No TR - CTA chest 1/23 no PE. + evidence of RV strain   - Etiology unclear - suspect mixed picture - RHC 4/23: PA = 43/11 (26) PCW = 14 (v- waves up to 35)Fick = 6.6/2.9 PVR = 1.8 WU - Sleeps study with severe OSA/CSA - on CPAP - VQ scan negative - Serologies negative - PFTs 3/23: FEV1 1.54 (42%) FVC 3.23 (64) FEV1/FVC 48% DLCO 46% - RHC suggests valvular disease playing significant role - Overall much improved. NYHA II. Volume status much improved with metolazone but I worry he may be getting too dry. Cut metolazone down to M/F and add spiro to help spare K. Stop amlodipine  - Labs today and 2 weeks - Plan cMRI to assess for possible amyloid and also to look for constriction and valvular disease - Consider TEE as needed to further assess valvualr disease - Not candidate for pulmonary artery vasodilators with normal PVR  2. CAD s/p CABG 2019  - no s/s angina - continue ASA/statin - managed by Dr. BGwenlyn Found 3. Chronic AF - stable.  Rate slow but responds to activity  - followed by Dr. BGwenlyn Found - Continue Eliquis - no current role for PPM   4. COPD - consider pulmonary f/u  DGlori Bickers MD  10:39 AM

## 2021-11-21 DIAGNOSIS — G4730 Sleep apnea, unspecified: Secondary | ICD-10-CM | POA: Diagnosis not present

## 2021-11-21 DIAGNOSIS — R0683 Snoring: Secondary | ICD-10-CM | POA: Diagnosis not present

## 2021-11-27 DIAGNOSIS — I10 Essential (primary) hypertension: Secondary | ICD-10-CM | POA: Diagnosis not present

## 2021-11-27 DIAGNOSIS — G4733 Obstructive sleep apnea (adult) (pediatric): Secondary | ICD-10-CM | POA: Diagnosis not present

## 2021-11-30 ENCOUNTER — Ambulatory Visit (HOSPITAL_COMMUNITY)
Admission: RE | Admit: 2021-11-30 | Discharge: 2021-11-30 | Disposition: A | Discharge status: Home / Self Care | Payer: Medicare Other | Source: Ambulatory Visit | Attending: Internal Medicine | Admitting: Internal Medicine

## 2021-11-30 DIAGNOSIS — I272 Pulmonary hypertension, unspecified: Secondary | ICD-10-CM | POA: Insufficient documentation

## 2021-11-30 LAB — BASIC METABOLIC PANEL
Anion gap: 11 (ref 5–15)
BUN: 26 mg/dL — ABNORMAL HIGH (ref 8–23)
CO2: 25 mmol/L (ref 22–32)
Calcium: 9.7 mg/dL (ref 8.9–10.3)
Chloride: 102 mmol/L (ref 98–111)
Creatinine, Ser: 0.96 mg/dL (ref 0.61–1.24)
GFR, Estimated: >60 mL/min (ref >60)
Glucose, Bld: 92 mg/dL (ref 70–99)
Potassium: 4.5 mmol/L (ref 3.5–5.1)
Sodium: 138 mmol/L (ref 135–145)

## 2021-12-12 ENCOUNTER — Ambulatory Visit: Payer: Medicare Other | Attending: Cardiovascular Disease | Admitting: Cardiovascular Disease

## 2021-12-12 ENCOUNTER — Encounter: Payer: Self-pay | Admitting: Cardiovascular Disease

## 2021-12-12 ENCOUNTER — Other Ambulatory Visit: Payer: Self-pay

## 2021-12-12 DIAGNOSIS — I739 Peripheral vascular disease, unspecified: Secondary | ICD-10-CM | POA: Diagnosis not present

## 2021-12-12 DIAGNOSIS — I482 Chronic atrial fibrillation, unspecified: Secondary | ICD-10-CM | POA: Diagnosis not present

## 2021-12-12 DIAGNOSIS — I10 Essential (primary) hypertension: Secondary | ICD-10-CM

## 2021-12-12 DIAGNOSIS — I2510 Atherosclerotic heart disease of native coronary artery without angina pectoris: Secondary | ICD-10-CM

## 2021-12-12 DIAGNOSIS — E785 Hyperlipidemia, unspecified: Secondary | ICD-10-CM

## 2021-12-12 DIAGNOSIS — Z9861 Coronary angioplasty status: Secondary | ICD-10-CM | POA: Diagnosis not present

## 2021-12-12 DIAGNOSIS — I272 Pulmonary hypertension, unspecified: Secondary | ICD-10-CM | POA: Diagnosis not present

## 2021-12-12 DIAGNOSIS — G4733 Obstructive sleep apnea (adult) (pediatric): Secondary | ICD-10-CM

## 2021-12-12 MED ORDER — SODIUM CHLORIDE 0.9% FLUSH: 3.0000 mL | Freq: Two times a day (BID) | INTRAVENOUS | Status: DC

## 2021-12-12 NOTE — Assessment & Plan Note (Signed)
History of peripheral arterial disease with Doppler that suggest bilateral iliac obstruction.  He is symptom limited and is unable to walk long distances.  This is affecting his quality of life.  He wishes to proceed with outpatient peripheral angiography and endovascular therapy.  We will need to stop his Eliquis 2 days prior.

## 2021-12-12 NOTE — Assessment & Plan Note (Signed)
History of chronic A. fib rate controlled on Eliquis oral anticoagulation 

## 2021-12-12 NOTE — H&P (View-Only) (Signed)
12/12/2021 [name]   [birth year]  [id number]  Primary Physician Lavone Orn, MD Primary Cardiologist: Lorretta Harp MD FACP, [place], [place], [place]  HPI:  [name] is a 81 y.o.  mildly overweight, married Caucasian male father of 2, grandfather of 3 grandchildren who I saw  in the office 10/31/2021.[name] He has a history of CAD status post RCA stenting by myself January 11, 1998. He had normal circumflex, LAD and ejection fraction. His other problems include hypertension and chronic atrial fibrillation on Coumadin anticoagulation, rate controlled, as well as erectile dysfunction. He is totally asymptomatic. He did have colon cancer picked up on screening colonoscopy and underwent right colectomy May 29, 2006 followed by 11 cycles of adjuvant Folfox chemotherapy. [name] He is very active exercises 6 days a week doing weight training 3 days a week, and lower extremity training 3 times a week.[name]   He was admitted to the hospital on 01/21/2018 with non-STEMI.  He underwent cardiac catheterization 2 days later by Dr. Saunders Revel revealing left main disease but disease in his LAD and RCA as well.  He underwent CABG by Dr. Cyndia Bent on 01/28/2018 with a LIMA to his LAD, vein to an obtuse marginal branch and to the PDA and PLA sequentially.  He also had left atrial clipping at that time.    He ended up completing cardiac rehab as an outpatient.   He saw [name], NP in the office 12/14/2020 who placed him on a low-dose diuretic and obtain a 2D echocardiogram which was performed 12/20/2020 revealing severe TR with pulmonary hypertension which is new for him.  He is fairly active but does complain of dyspnea on exertion which is a new symptom.     His symptoms of dyspnea significantly improved with the addition of a diuretic.  I did go chest CTA that showed no evidence of thromboembolic disease.  In addition, I did lower extremity arterial Doppler studies that show significant iliac disease bilaterally.   He does complain of lifestyle limiting claudication.  He had a right heart cath performed by Dr. Haroldine Laws 06/14/2021 that showed mild pulmonary hypertension although he had V waves in both the right atrium and the wedge positions suggesting MR and TR.  He had a sleep study that showed severe obstructive sleep apnea.  He does have an appointment with Dr. Haroldine Laws at the end of May to discuss the right heart findings.  He may need a cardiac MRI and a TEE to further evaluate.  A chest CTA was negative for pulmonary thromboembolic disease as well as a VQ scan.  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   Since I saw him a month ago he has lost significant mount of weight as a result of the needed metolazone which I placed him on.  His peripheral edema has completely resolved.  His dyspnea has resolved as well and he feels clinically improved.  He currently denies chest pain or shortness of breath.  His major complaint is lifestyle-limiting claudication with Doppler studies performed 04/02/2021 that revealed bilateral iliac disease which he wishes to have addressed endovascularly.   No outpatient medications have been marked as taking for the 12/12/21 encounter (Office Visit) with Lorretta Harp, MD.     No Known Allergies  Social History   Socioeconomic History   Marital status: Married    Spouse  name: [name]   Number of children: 2   Years of education: Not on file   Highest education level: Bachelor's degree (e.g., BA, AB, BS)  Occupational History   Occupation: retired    Fish farm manager: RETIRED  Tobacco Use   Smoking status: Former    Years: 30.00    Types: Cigarettes    Quit date: 11/25/1993    Years since quitting: 28.0   Smokeless tobacco: Never  Vaping Use   Vaping Use: Never used  Substance and Sexual Activity   Alcohol use: Yes     Alcohol/week: 1.0 standard drink of alcohol    Types: 1 Cans of beer per week   Drug use: No   Sexual activity: Not on file  Other Topics Concern   Not on file  Social History Narrative   Not on file   Social Determinants of Health   Financial Resource Strain: Low Risk  (04/08/2018)   Overall Financial Resource Strain (CARDIA)    Difficulty of Paying Living Expenses: Not hard at all  Food Insecurity: No Food Insecurity (04/08/2018)   Hunger Vital Sign    Worried About Running Out of Food in the Last Year: Never true    Ran Out of Food in the Last Year: Never true  Transportation Needs: No Transportation Needs (04/08/2018)   PRAPARE - Hydrologist (Medical): No    Lack of Transportation (Non-Medical): No  Physical Activity: Unknown (04/08/2018)   Exercise Vital Sign    Days of Exercise per Week: 3 days    Minutes of Exercise per Session: Not asked  Stress: Stress Concern Present (04/08/2018)   [place]    Feeling of Stress : To some extent  Social Connections: Not on file  Intimate Partner Violence: Not on file     Review of Systems: General: negative for chills, fever, night sweats or weight changes.  Cardiovascular: negative for chest pain, dyspnea on exertion, edema, orthopnea, palpitations, paroxysmal nocturnal dyspnea or shortness of breath Dermatological: negative for rash Respiratory: negative for cough or wheezing Urologic: negative for hematuria Abdominal: negative for nausea, vomiting, diarrhea, bright red blood per rectum, melena, or hematemesis Neurologic: negative for visual changes, syncope, or dizziness All other systems reviewed and are otherwise negative except as noted above.    Blood pressure (!) 136/50, pulse 61, height '6\' 3"'$  (1.905 m), weight 210 lb (95.3 kg).  General appearance: alert and no distress Neck: no adenopathy, no carotid bruit, no JVD, supple,  symmetrical, trachea midline, and thyroid not enlarged, symmetric, no tenderness/mass/nodules Lungs: clear to auscultation bilaterally Heart: irregularly irregular rhythm Extremities: extremities normal, atraumatic, no cyanosis or edema Pulses: Diminished pedal pulses Skin: Skin color, texture, turgor normal. No rashes or lesions Neurologic: Grossly normal  EKG not performed today  ASSESSMENT AND PLAN:   Essential hypertension History of essential hypertension with blood pressure measured today at 136/50.  He currently is not on antihypertensive medications.  Atrial fibrillation (HCC) History of chronic A-fib rate controlled on Eliquis oral anticoagulation.  CAD S/P percutaneous coronary angioplasty History of CAD status post RCA stenting by myself 01/11/1998.  He had normal circumflex and LAD at that time.  He had a non-STEMI 01/21/2018 and underwent cardiac catheterization 2 days later by Dr. And revealing left main disease.  He underwent CABG by Dr. Cyndia Bent 01/28/2018 with a LIMA to his LAD, vein to an obtuse marginal branch, and to the PDA/PLA sequentially.  He also  had left atrial clipping at that time.  He denies chest pain.  Dyslipidemia, goal LDL below 70 History of dyslipidemia on statin therapy with lipid profile performed 01/06/2021 revealing total cholesterol 110, LDL 45 and HDL 49.  Pulmonary hypertension, unspecified ([place]) History of pulmonary hypertension with right heart cath recently performed by Dr. Haroldine Laws 06/14/2021 that only showed mild pulmonary hypertension.  He did have high V waves in both the right atrium and in the wedge position suggesting significant MR and TR.  Peripheral arterial disease (HCC) History of peripheral arterial disease with Doppler that suggest bilateral iliac obstruction.  He is symptom limited and is unable to walk long distances.  This is affecting his quality of life.  He wishes to proceed with outpatient peripheral angiography and  endovascular therapy.  We will need to stop his Eliquis 2 days prior.  Obstructive sleep apnea History of obstructive sleep apnea on BiPAP followed by Dr. Radford Pax which he benefits from.     Lorretta Harp MD FACP,FACC,FAHA, [facility] 12/12/2021 11:50 AM

## 2021-12-12 NOTE — Progress Notes (Signed)
12/12/2021 [name]   [birth year]  [id number]  Primary Physician Lavone Orn, MD Primary Cardiologist: Lorretta Harp MD FACP, [place], [place], [place]  HPI:  [name] is a 81 y.o.  mildly overweight, married Caucasian male father of 2, grandfather of 3 grandchildren who I saw  in the office 10/31/2021.[name] He has a history of CAD status post RCA stenting by myself January 11, 1998. He had normal circumflex, LAD and ejection fraction. His other problems include hypertension and chronic atrial fibrillation on Coumadin anticoagulation, rate controlled, as well as erectile dysfunction. He is totally asymptomatic. He did have colon cancer picked up on screening colonoscopy and underwent right colectomy May 29, 2006 followed by 11 cycles of adjuvant Folfox chemotherapy. [name] He is very active exercises 6 days a week doing weight training 3 days a week, and lower extremity training 3 times a week.[name]   He was admitted to the hospital on 01/21/2018 with non-STEMI.  He underwent cardiac catheterization 2 days later by Dr. Saunders Revel revealing left main disease but disease in his LAD and RCA as well.  He underwent CABG by Dr. Cyndia Bent on 01/28/2018 with a LIMA to his LAD, vein to an obtuse marginal branch and to the PDA and PLA sequentially.  He also had left atrial clipping at that time.    He ended up completing cardiac rehab as an outpatient.   He saw [name], NP in the office 12/14/2020 who placed him on a low-dose diuretic and obtain a 2D echocardiogram which was performed 12/20/2020 revealing severe TR with pulmonary hypertension which is new for him.  He is fairly active but does complain of dyspnea on exertion which is a new symptom.     His symptoms of dyspnea significantly improved with the addition of a diuretic.  I did go chest CTA that showed no evidence of thromboembolic disease.  In addition, I did lower extremity arterial Doppler studies that show significant iliac disease bilaterally.   He does complain of lifestyle limiting claudication.  He had a right heart cath performed by Dr. Haroldine Laws 06/14/2021 that showed mild pulmonary hypertension although he had V waves in both the right atrium and the wedge positions suggesting MR and TR.  He had a sleep study that showed severe obstructive sleep apnea.  He does have an appointment with Dr. Haroldine Laws at the end of May to discuss the right heart findings.  He may need a cardiac MRI and a TEE to further evaluate.  A chest CTA was negative for pulmonary thromboembolic disease as well as a VQ scan.  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   Since I saw him a month ago he has lost significant mount of weight as a result of the needed metolazone which I placed him on.  His peripheral edema has completely resolved.  His dyspnea has resolved as well and he feels clinically improved.  He currently denies chest pain or shortness of breath.  His major complaint is lifestyle-limiting claudication with Doppler studies performed 04/02/2021 that revealed bilateral iliac disease which he wishes to have addressed endovascularly.   No outpatient medications have been marked as taking for the 12/12/21 encounter (Office Visit) with Lorretta Harp, MD.     No Known Allergies  Social History   Socioeconomic History   Marital status: Married    Spouse  name: [name]   Number of children: 2   Years of education: Not on file   Highest education level: Bachelor's degree (e.g., BA, AB, BS)  Occupational History   Occupation: retired    Fish farm manager: RETIRED  Tobacco Use   Smoking status: Former    Years: 30.00    Types: Cigarettes    Quit date: 11/25/1993    Years since quitting: 28.0   Smokeless tobacco: Never  Vaping Use   Vaping Use: Never used  Substance and Sexual Activity   Alcohol use: Yes     Alcohol/week: 1.0 standard drink of alcohol    Types: 1 Cans of beer per week   Drug use: No   Sexual activity: Not on file  Other Topics Concern   Not on file  Social History Narrative   Not on file   Social Determinants of Health   Financial Resource Strain: Low Risk  (04/08/2018)   Overall Financial Resource Strain (CARDIA)    Difficulty of Paying Living Expenses: Not hard at all  Food Insecurity: No Food Insecurity (04/08/2018)   Hunger Vital Sign    Worried About Running Out of Food in the Last Year: Never true    Ran Out of Food in the Last Year: Never true  Transportation Needs: No Transportation Needs (04/08/2018)   PRAPARE - Hydrologist (Medical): No    Lack of Transportation (Non-Medical): No  Physical Activity: Unknown (04/08/2018)   Exercise Vital Sign    Days of Exercise per Week: 3 days    Minutes of Exercise per Session: Not asked  Stress: Stress Concern Present (04/08/2018)   [place]    Feeling of Stress : To some extent  Social Connections: Not on file  Intimate Partner Violence: Not on file     Review of Systems: General: negative for chills, fever, night sweats or weight changes.  Cardiovascular: negative for chest pain, dyspnea on exertion, edema, orthopnea, palpitations, paroxysmal nocturnal dyspnea or shortness of breath Dermatological: negative for rash Respiratory: negative for cough or wheezing Urologic: negative for hematuria Abdominal: negative for nausea, vomiting, diarrhea, bright red blood per rectum, melena, or hematemesis Neurologic: negative for visual changes, syncope, or dizziness All other systems reviewed and are otherwise negative except as noted above.    Blood pressure (!) 136/50, pulse 61, height '6\' 3"'$  (1.905 m), weight 210 lb (95.3 kg).  General appearance: alert and no distress Neck: no adenopathy, no carotid bruit, no JVD, supple,  symmetrical, trachea midline, and thyroid not enlarged, symmetric, no tenderness/mass/nodules Lungs: clear to auscultation bilaterally Heart: irregularly irregular rhythm Extremities: extremities normal, atraumatic, no cyanosis or edema Pulses: Diminished pedal pulses Skin: Skin color, texture, turgor normal. No rashes or lesions Neurologic: Grossly normal  EKG not performed today  ASSESSMENT AND PLAN:   Essential hypertension History of essential hypertension with blood pressure measured today at 136/50.  He currently is not on antihypertensive medications.  Atrial fibrillation (HCC) History of chronic A-fib rate controlled on Eliquis oral anticoagulation.  CAD S/P percutaneous coronary angioplasty History of CAD status post RCA stenting by myself 01/11/1998.  He had normal circumflex and LAD at that time.  He had a non-STEMI 01/21/2018 and underwent cardiac catheterization 2 days later by Dr. And revealing left main disease.  He underwent CABG by Dr. Cyndia Bent 01/28/2018 with a LIMA to his LAD, vein to an obtuse marginal branch, and to the PDA/PLA sequentially.  He also  had left atrial clipping at that time.  He denies chest pain.  Dyslipidemia, goal LDL below 70 History of dyslipidemia on statin therapy with lipid profile performed 01/06/2021 revealing total cholesterol 110, LDL 45 and HDL 49.  Pulmonary hypertension, unspecified ([place]) History of pulmonary hypertension with right heart cath recently performed by Dr. Haroldine Laws 06/14/2021 that only showed mild pulmonary hypertension.  He did have high V waves in both the right atrium and in the wedge position suggesting significant MR and TR.  Peripheral arterial disease (HCC) History of peripheral arterial disease with Doppler that suggest bilateral iliac obstruction.  He is symptom limited and is unable to walk long distances.  This is affecting his quality of life.  He wishes to proceed with outpatient peripheral angiography and  endovascular therapy.  We will need to stop his Eliquis 2 days prior.  Obstructive sleep apnea History of obstructive sleep apnea on BiPAP followed by Dr. Radford Pax which he benefits from.     Lorretta Harp MD FACP,FACC,FAHA, [facility] 12/12/2021 11:50 AM

## 2021-12-12 NOTE — Assessment & Plan Note (Signed)
History of pulmonary hypertension with right heart cath recently performed by Dr. Haroldine Laws 06/14/2021 that only showed mild pulmonary hypertension.  He did have high V waves in both the right atrium and in the wedge position suggesting significant MR and TR.

## 2021-12-12 NOTE — Assessment & Plan Note (Signed)
History of obstructive sleep apnea on BiPAP followed by Dr. Radford Pax which he benefits from.

## 2021-12-12 NOTE — Assessment & Plan Note (Signed)
History of essential hypertension with blood pressure measured today at 136/50.  He currently is not on antihypertensive medications.

## 2021-12-12 NOTE — Patient Instructions (Addendum)
Medication Instructions:  Your physician recommends that you continue on your current medications as directed. Please refer to the Current Medication list given to you today.  *If you need a refill on your cardiac medications before your next appointment, please call your pharmacy*   Lab Work: Your physician recommends that you have labs drawn today: CBC  If you have labs (blood work) drawn today and your tests are completely normal, you will receive your results only by: [place] (if you have MyChart) OR A paper copy in the mail If you have any lab test that is abnormal or we need to change your treatment, we will call you to review the results.   Testing/Procedures: Your physician has requested that you have a lower extremity arterial duplex. This test is an ultrasound of the arteries in the legs. It looks at arterial blood flow in the legs. Allow one hour for Lower Arterial scans. There are no restrictions or special instructions To be done 1-2 weeks after procedure (10/5). This procedure will be done at [place]  Your physician has requested that you have an ankle brachial index (ABI). During this test an ultrasound and blood pressure cuff are used to evaluate the arteries that supply the arms and legs with blood. Allow thirty minutes for this exam. There are no restrictions or special instructions. To be done 1-2 weeks after procedure (10/5). This procedure will be done at [place]. [address]    Follow-Up: At [facility], you and your health needs are our priority.  As part of our continuing mission to provide you with exceptional heart care, we have created designated Provider Care Teams.  These Care Teams include your primary Cardiologist (physician) and Advanced Practice Providers (APPs -  Physician Assistants and Nurse Practitioners) who all work together to provide you with the care you need, when you need it.  We recommend signing up for  the patient portal called "MyChart".  Sign up information is provided on this After Visit Summary.  MyChart is used to connect with patients for Virtual Visits (Telemedicine).  Patients are able to view lab/test results, encounter notes, upcoming appointments, etc.  Non-urgent messages can be sent to your provider as well.   To learn more about what you can do with MyChart, go to [email or web address].    Your next appointment:   2-3 week(s)  The format for your next appointment:   In Person  Provider:   [name], MD    Other Instructions    Cardiac/Peripheral Catheterization   You are scheduled for a Peripheral Angiogram on Thursday, October 5 with Dr. [name].  1. Please arrive at the Main Entrance A at [facility] [place], [place] [address] on October 5 at 5:30 AM (This time is two hours before your procedure to ensure your preparation). Free valet parking service is available. You will check in at ADMITTING. The support person will be asked to wait in the waiting room.  It is OK to have someone drop you off and come back when you are ready to be discharged.        Special note: Every effort is made to have your procedure done on time. Please understand that emergencies sometimes delay scheduled procedures.   . 2. Diet: Do not eat solid foods after midnight.  You may have clear liquids until 5 AM the day of the procedure.  3. Labs: You will need to have  blood drawn today.  4. Medication instructions in preparation for your procedure:    Stop taking Eliquis (Apixiban) on Monday, October 2.  Stop taking, Lasix (Furosemide) , Metolazone (Zaroxolyn), and Spironolactone Thursday, October 5,   On the morning of your procedure, take Aspirin 81 mg and any morning medicines NOT listed above.  You may use sips of water.  5. Pack an overnight bag, every effort will be made to send you home, you will only stay overnight if medically necessary. 6.  You MUST have a responsible adult to drive you home. 7. An adult MUST be with you the first 24 hours after you arrive home. 8. Bring a current list of your medications, and the last time and date medication taken. 9. Bring ID and current insurance cards. 10.Please wear clothes that are easy to get on and off and wear slip-on shoes.  Thank you for allowing [place] to care for you!   -- [facility]

## 2021-12-12 NOTE — Assessment & Plan Note (Signed)
History of dyslipidemia on statin therapy with lipid profile performed 01/06/2021 revealing total cholesterol 110, LDL 45 and HDL 49.

## 2021-12-12 NOTE — Assessment & Plan Note (Signed)
History of CAD status post RCA stenting by myself 01/11/1998.  He had normal circumflex and LAD at that time.  He had a non-STEMI 01/21/2018 and underwent cardiac catheterization 2 days later by Dr. And revealing left main disease.  He underwent CABG by Dr. Cyndia Bent 01/28/2018 with a LIMA to his LAD, vein to an obtuse marginal branch, and to the PDA/PLA sequentially.  He also had left atrial clipping at that time.  He denies chest pain.

## 2021-12-13 LAB — CBC
Hematocrit: 43.1 % (ref 37.5–51.0)
Hemoglobin: 14.7 g/dL (ref 13.0–17.7)
MCH: 30.4 pg (ref 26.6–33.0)
MCHC: 34.1 g/dL (ref 31.5–35.7)
MCV: 89 fL (ref 79–97)
Platelets: 176 10*3/uL (ref 150–450)
RBC: 4.83 x10E6/uL (ref 4.14–5.80)
RDW: 14.4 % (ref 11.6–15.4)
WBC: 8.3 10*3/uL (ref 3.4–10.8)

## 2021-12-14 ENCOUNTER — Telehealth: Payer: Self-pay | Admitting: *Deleted

## 2021-12-14 NOTE — Telephone Encounter (Signed)
[name] Margarita, MD       No evidence of nocturnal hypoxemia on CPAP.  No supplemental oxygen needed

## 2021-12-14 NOTE — Telephone Encounter (Signed)
The patient has been notified of the result. Left detailed message on voicemail and informed patient to call back with questions.Marolyn Hammock, CMA .     Patient was informed he has No evidence of nocturnal hypoxemia on Cpap and No supplemental oxygen needed. Pt is agreeable to normal results.

## 2021-12-16 DIAGNOSIS — G4733 Obstructive sleep apnea (adult) (pediatric): Secondary | ICD-10-CM | POA: Diagnosis not present

## 2021-12-16 DIAGNOSIS — R0683 Snoring: Secondary | ICD-10-CM | POA: Diagnosis not present

## 2021-12-16 DIAGNOSIS — I10 Essential (primary) hypertension: Secondary | ICD-10-CM | POA: Diagnosis not present

## 2021-12-19 ENCOUNTER — Telehealth: Payer: Self-pay | Admitting: *Deleted

## 2021-12-19 NOTE — Telephone Encounter (Signed)
Abdominal aortogram scheduled at [facility] for: Thursday December 21, 2021 7:30  AM Arrival time and place: [place] Entrance A at: 5:30 AM  Nothing to eat after midnight prior to procedure, clear liquids until 5 AM day of procedure.   Medication instructions: -Hold:  Eliquis-none 12/18/21 until post procedure  Spironolactone/Lasix/Zaroxolyn-AM of procedure -Except hold medications usual morning medications can be taken with sips of water including aspirin 81 mg.  Confirmed patient has responsible adult to drive home post procedure and be with patient first 24 hours after arriving home.  Patient reports no new symptoms concerning for COVID-19 in the past 10 days.  Reviewed procedure instructions with patient.

## 2021-12-21 ENCOUNTER — Encounter (HOSPITAL_COMMUNITY)
Admission: RE | Disposition: A | Discharge status: Home / Self Care | Payer: Self-pay | Source: Home / Self Care | Attending: Cardiovascular Disease

## 2021-12-21 ENCOUNTER — Other Ambulatory Visit: Payer: Self-pay

## 2021-12-21 ENCOUNTER — Encounter (HOSPITAL_COMMUNITY): Payer: Self-pay | Admitting: Cardiovascular Disease

## 2021-12-21 ENCOUNTER — Ambulatory Visit (HOSPITAL_COMMUNITY)
Admission: RE | Admit: 2021-12-21 | Discharge: 2021-12-22 | Disposition: A | Discharge status: Home / Self Care | Payer: Medicare Other | Attending: Cardiovascular Disease | Admitting: Cardiovascular Disease

## 2021-12-21 DIAGNOSIS — G4733 Obstructive sleep apnea (adult) (pediatric): Secondary | ICD-10-CM | POA: Diagnosis present

## 2021-12-21 DIAGNOSIS — I272 Pulmonary hypertension, unspecified: Secondary | ICD-10-CM | POA: Diagnosis not present

## 2021-12-21 DIAGNOSIS — I482 Chronic atrial fibrillation, unspecified: Secondary | ICD-10-CM | POA: Diagnosis not present

## 2021-12-21 DIAGNOSIS — Z85038 Personal history of other malignant neoplasm of large intestine: Secondary | ICD-10-CM | POA: Insufficient documentation

## 2021-12-21 DIAGNOSIS — I252 Old myocardial infarction: Secondary | ICD-10-CM | POA: Diagnosis not present

## 2021-12-21 DIAGNOSIS — Z7901 Long term (current) use of anticoagulants: Secondary | ICD-10-CM | POA: Diagnosis not present

## 2021-12-21 DIAGNOSIS — Z955 Presence of coronary angioplasty implant and graft: Secondary | ICD-10-CM | POA: Insufficient documentation

## 2021-12-21 DIAGNOSIS — I4891 Unspecified atrial fibrillation: Secondary | ICD-10-CM | POA: Diagnosis present

## 2021-12-21 DIAGNOSIS — E785 Hyperlipidemia, unspecified: Secondary | ICD-10-CM | POA: Diagnosis present

## 2021-12-21 DIAGNOSIS — I70212 Atherosclerosis of native arteries of extremities with intermittent claudication, left leg: Secondary | ICD-10-CM | POA: Diagnosis not present

## 2021-12-21 DIAGNOSIS — R600 Localized edema: Secondary | ICD-10-CM | POA: Diagnosis not present

## 2021-12-21 DIAGNOSIS — Z9221 Personal history of antineoplastic chemotherapy: Secondary | ICD-10-CM | POA: Diagnosis not present

## 2021-12-21 DIAGNOSIS — Z951 Presence of aortocoronary bypass graft: Secondary | ICD-10-CM

## 2021-12-21 DIAGNOSIS — Z87891 Personal history of nicotine dependence: Secondary | ICD-10-CM | POA: Insufficient documentation

## 2021-12-21 DIAGNOSIS — Z9049 Acquired absence of other specified parts of digestive tract: Secondary | ICD-10-CM | POA: Diagnosis not present

## 2021-12-21 DIAGNOSIS — I70213 Atherosclerosis of native arteries of extremities with intermittent claudication, bilateral legs: Secondary | ICD-10-CM | POA: Insufficient documentation

## 2021-12-21 DIAGNOSIS — I70211 Atherosclerosis of native arteries of extremities with intermittent claudication, right leg: Secondary | ICD-10-CM | POA: Diagnosis not present

## 2021-12-21 DIAGNOSIS — I6529 Occlusion and stenosis of unspecified carotid artery: Secondary | ICD-10-CM | POA: Diagnosis present

## 2021-12-21 DIAGNOSIS — N528 Other male erectile dysfunction: Secondary | ICD-10-CM | POA: Insufficient documentation

## 2021-12-21 DIAGNOSIS — I2510 Atherosclerotic heart disease of native coronary artery without angina pectoris: Secondary | ICD-10-CM

## 2021-12-21 DIAGNOSIS — I10 Essential (primary) hypertension: Secondary | ICD-10-CM | POA: Diagnosis not present

## 2021-12-21 DIAGNOSIS — I739 Peripheral vascular disease, unspecified: Secondary | ICD-10-CM

## 2021-12-21 HISTORY — PX: PERIPHERAL VASCULAR INTERVENTION: CATH118257

## 2021-12-21 HISTORY — PX: PERIPHERAL VASCULAR ATHERECTOMY: CATH118256

## 2021-12-21 HISTORY — PX: ABDOMINAL AORTOGRAM W/LOWER EXTREMITY: CATH118223

## 2021-12-21 LAB — POCT ACTIVATED CLOTTING TIME
Activated Clotting Time: 275 seconds
Activated Clotting Time: 281 seconds
Activated Clotting Time: 251 seconds
Activated Clotting Time: 191 seconds
Activated Clotting Time: 179 seconds

## 2021-12-21 SURGERY — ABDOMINAL AORTOGRAM W/LOWER EXTREMITY
Anesthesia: LOCAL

## 2021-12-21 MED ORDER — SODIUM CHLORIDE 0.9 % IV SOLN: INTRAVENOUS | Status: AC

## 2021-12-21 MED ORDER — METOLAZONE 5 MG PO TABS
2.5000 mg | ORAL_TABLET | ORAL | Status: DC
  Administered 2021-12-21: 2.5 mg via ORAL
  Filled 2021-12-21: qty 1

## 2021-12-21 MED ORDER — HEPARIN (PORCINE) IN NACL 1000-0.9 UT/500ML-% IV SOLN
INTRAVENOUS | Status: AC
  Filled 2021-12-21: qty 500

## 2021-12-21 MED ORDER — CLOPIDOGREL BISULFATE 75 MG PO TABS
75.0000 mg | ORAL_TABLET | Freq: Every day | ORAL | Status: DC
  Filled 2021-12-21: qty 1

## 2021-12-21 MED ORDER — FENTANYL CITRATE (PF) 100 MCG/2ML IJ SOLN
INTRAMUSCULAR | Status: DC | PRN
  Administered 2021-12-21 (×3): 25 ug via INTRAVENOUS

## 2021-12-21 MED ORDER — FUROSEMIDE 40 MG PO TABS
40.0000 mg | ORAL_TABLET | Freq: Every day | ORAL | Status: DC
  Filled 2021-12-21: qty 1

## 2021-12-21 MED ORDER — FENTANYL CITRATE (PF) 100 MCG/2ML IJ SOLN
INTRAMUSCULAR | Status: AC
  Filled 2021-12-21: qty 2

## 2021-12-21 MED ORDER — NITROGLYCERIN 1 MG/10 ML FOR IR/CATH LAB
INTRA_ARTERIAL | Status: AC
  Filled 2021-12-21: qty 10

## 2021-12-21 MED ORDER — SODIUM CHLORIDE 0.9 % WEIGHT BASED INFUSION: 1.0000 mL/kg/h | INTRAVENOUS | Status: DC

## 2021-12-21 MED ORDER — SODIUM CHLORIDE 0.9 % WEIGHT BASED INFUSION
3.0000 mL/kg/h | INTRAVENOUS | Status: DC
  Administered 2021-12-21: 3 mL/kg/h via INTRAVENOUS

## 2021-12-21 MED ORDER — SODIUM CHLORIDE 0.9 % IV SOLN: 250.0000 mL | INTRAVENOUS | Status: DC | PRN

## 2021-12-21 MED ORDER — SODIUM CHLORIDE 0.9% FLUSH: 3.0000 mL | INTRAVENOUS | Status: DC | PRN

## 2021-12-21 MED ORDER — NITROGLYCERIN 1 MG/10 ML FOR IR/CATH LAB
INTRA_ARTERIAL | Status: DC | PRN
  Administered 2021-12-21 (×3): 200 ug via INTRA_ARTERIAL

## 2021-12-21 MED ORDER — NITROGLYCERIN IN D5W 200-5 MCG/ML-% IV SOLN
INTRAVENOUS | Status: AC
  Filled 2021-12-21: qty 250

## 2021-12-21 MED ORDER — ONDANSETRON HCL 4 MG/2ML IJ SOLN: 4.0000 mg | Freq: Four times a day (QID) | INTRAMUSCULAR | Status: DC | PRN

## 2021-12-21 MED ORDER — ASPIRIN 81 MG PO CHEW: 81.0000 mg | CHEWABLE_TABLET | ORAL | Status: DC

## 2021-12-21 MED ORDER — HEPARIN (PORCINE) IN NACL 1000-0.9 UT/500ML-% IV SOLN
INTRAVENOUS | Status: DC | PRN
  Administered 2021-12-21 (×3): 500 mL

## 2021-12-21 MED ORDER — VERAPAMIL HCL 2.5 MG/ML IV SOLN
INTRAVENOUS | Status: AC
  Filled 2021-12-21: qty 2

## 2021-12-21 MED ORDER — VIPERSLIDE LUBRICANT OPTIME: TOPICAL | Status: DC | PRN

## 2021-12-21 MED ORDER — HEPARIN SODIUM (PORCINE) 1000 UNIT/ML IJ SOLN
INTRAMUSCULAR | Status: AC
  Filled 2021-12-21: qty 10

## 2021-12-21 MED ORDER — LIDOCAINE HCL (PF) 1 % IJ SOLN
INTRAMUSCULAR | Status: AC
  Filled 2021-12-21: qty 30

## 2021-12-21 MED ORDER — LIDOCAINE HCL (PF) 1 % IJ SOLN
INTRAMUSCULAR | Status: DC | PRN
  Administered 2021-12-21 (×2): 15 mL

## 2021-12-21 MED ORDER — ASPIRIN 81 MG PO TABS: 81.0000 mg | ORAL_TABLET | Freq: Every day | ORAL | Status: DC

## 2021-12-21 MED ORDER — MIDAZOLAM HCL 2 MG/2ML IJ SOLN
INTRAMUSCULAR | Status: AC
  Filled 2021-12-21: qty 2

## 2021-12-21 MED ORDER — LABETALOL HCL 5 MG/ML IV SOLN: 10.0000 mg | INTRAVENOUS | Status: DC | PRN

## 2021-12-21 MED ORDER — HEPARIN SODIUM (PORCINE) 1000 UNIT/ML IJ SOLN
INTRAMUSCULAR | Status: DC | PRN
  Administered 2021-12-21: 10000 [IU] via INTRAVENOUS
  Administered 2021-12-21: 2500 [IU] via INTRAVENOUS

## 2021-12-21 MED ORDER — MORPHINE SULFATE (PF) 2 MG/ML IV SOLN: 2.0000 mg | INTRAVENOUS | Status: DC | PRN

## 2021-12-21 MED ORDER — ASPIRIN 81 MG PO TBEC
81.0000 mg | DELAYED_RELEASE_TABLET | Freq: Every day | ORAL | Status: DC
  Filled 2021-12-21: qty 1

## 2021-12-21 MED ORDER — CLOPIDOGREL BISULFATE 300 MG PO TABS
ORAL_TABLET | ORAL | Status: AC
  Filled 2021-12-21: qty 1

## 2021-12-21 MED ORDER — HYDRALAZINE HCL 20 MG/ML IJ SOLN: 5.0000 mg | INTRAMUSCULAR | Status: DC | PRN

## 2021-12-21 MED ORDER — ACETAMINOPHEN 325 MG PO TABS: 650.0000 mg | ORAL_TABLET | ORAL | Status: DC | PRN

## 2021-12-21 MED ORDER — MIDAZOLAM HCL 2 MG/2ML IJ SOLN
INTRAMUSCULAR | Status: DC | PRN
  Administered 2021-12-21: 1 mg via INTRAVENOUS

## 2021-12-21 MED ORDER — SODIUM CHLORIDE 0.9% FLUSH
3.0000 mL | Freq: Two times a day (BID) | INTRAVENOUS | Status: DC
  Administered 2021-12-21 (×2): 3 mL via INTRAVENOUS

## 2021-12-21 MED ORDER — ATORVASTATIN CALCIUM 80 MG PO TABS
80.0000 mg | ORAL_TABLET | Freq: Every day | ORAL | Status: DC
  Administered 2021-12-21: 80 mg via ORAL
  Filled 2021-12-21: qty 1

## 2021-12-21 MED ORDER — SPIRONOLACTONE 12.5 MG HALF TABLET
12.5000 mg | ORAL_TABLET | Freq: Every day | ORAL | Status: DC
  Filled 2021-12-21: qty 1

## 2021-12-21 MED ORDER — CLOPIDOGREL BISULFATE 300 MG PO TABS
ORAL_TABLET | ORAL | Status: DC | PRN
  Administered 2021-12-21: 300 mg via ORAL

## 2021-12-21 MED ORDER — IODIXANOL 320 MG/ML IV SOLN
INTRAVENOUS | Status: DC | PRN
  Administered 2021-12-21: 230 mL

## 2021-12-21 SURGICAL SUPPLY — 22 items
CATH ANGIO 5F PIGTAIL 65CM (CATHETERS) IMPLANT
CATH CROSS OVER TEMPO 5F (CATHETERS) IMPLANT
CATH STRAIGHT 5FR 65CM (CATHETERS) IMPLANT
DIAMONDBACK SOLID OAS 2.0MM (CATHETERS) ×2
GUIDEWIRE ANGLED .035X260CM (WIRE) IMPLANT
KIT ENCORE 26 ADVANTAGE (KITS) IMPLANT
KIT PV (KITS) ×2 IMPLANT
LUBRICANT VIPERSLIDE CORONARY (MISCELLANEOUS) IMPLANT
SHEATH BRITE TIP 7FR 35CM (SHEATH) IMPLANT
SHEATH PINNACLE 5F 10CM (SHEATH) IMPLANT
SHEATH PINNACLE 7F 10CM (SHEATH) IMPLANT
SHEATH PROBE COVER 6X72 (BAG) IMPLANT
STENT VIABAHN 7X29X80 VBX (Permanent Stent) IMPLANT
STOPCOCK MORSE 400PSI 3WAY (MISCELLANEOUS) IMPLANT
SYR MEDRAD MARK 7 150ML (SYRINGE) ×2 IMPLANT
SYSTEM DIMNDBCK SLD OAS 2.0MM (CATHETERS) IMPLANT
TAPE SHOOT N SEE (TAPE) IMPLANT
TRANSDUCER W/STOPCOCK (MISCELLANEOUS) ×2 IMPLANT
TRAY PV CATH (CUSTOM PROCEDURE TRAY) ×2 IMPLANT
TUBING CIL FLEX 10 FLL-RA (TUBING) IMPLANT
WIRE HITORQ VERSACORE ST 145CM (WIRE) IMPLANT
WIRE VIPER ADVANCE .017X335CM (WIRE) IMPLANT

## 2021-12-21 NOTE — Interval H&P Note (Signed)
History and Physical Interval Note:  12/21/2021 7:37 AM  [name]  has presented today for surgery, with the diagnosis of pad.  The various methods of treatment have been discussed with the patient and family. After consideration of risks, benefits and other options for treatment, the patient has consented to  Procedure(s): ABDOMINAL AORTOGRAM W/LOWER EXTREMITY (N/A) as a surgical intervention.  The patient's history has been reviewed, patient examined, no change in status, stable for surgery.  I have reviewed the patient's chart and labs.  Questions were answered to the patient's satisfaction.     [name]

## 2021-12-21 NOTE — Progress Notes (Signed)
Site area: rt groin sheath pulled by Georgina Snell; left groin sheath pulled by [employer] Site Prior to Removal:  Level 0 Pressure Applied For: 20 minutes rt groin; 25 minutes left groin Manual:   yes Patient Status During Pull:  stable Post Pull Site:  Level 0 Post Pull Instructions Given:  yes Post Pull Pulses Present: DP bilaterally dopplered Dressing Applied:  gauze and tegaderm Bedrest begins @ 9417 Comments:

## 2021-12-22 ENCOUNTER — Other Ambulatory Visit: Payer: Self-pay | Admitting: Cardiovascular Disease

## 2021-12-22 ENCOUNTER — Encounter (HOSPITAL_COMMUNITY): Payer: Self-pay | Admitting: Cardiovascular Disease

## 2021-12-22 ENCOUNTER — Other Ambulatory Visit (HOSPITAL_COMMUNITY): Payer: Self-pay

## 2021-12-22 DIAGNOSIS — I4821 Permanent atrial fibrillation: Secondary | ICD-10-CM

## 2021-12-22 DIAGNOSIS — Z85038 Personal history of other malignant neoplasm of large intestine: Secondary | ICD-10-CM | POA: Diagnosis not present

## 2021-12-22 DIAGNOSIS — I739 Peripheral vascular disease, unspecified: Secondary | ICD-10-CM

## 2021-12-22 DIAGNOSIS — R600 Localized edema: Secondary | ICD-10-CM | POA: Diagnosis not present

## 2021-12-22 DIAGNOSIS — I482 Chronic atrial fibrillation, unspecified: Secondary | ICD-10-CM | POA: Diagnosis not present

## 2021-12-22 DIAGNOSIS — E785 Hyperlipidemia, unspecified: Secondary | ICD-10-CM

## 2021-12-22 DIAGNOSIS — Z7901 Long term (current) use of anticoagulants: Secondary | ICD-10-CM

## 2021-12-22 DIAGNOSIS — Z9221 Personal history of antineoplastic chemotherapy: Secondary | ICD-10-CM | POA: Diagnosis not present

## 2021-12-22 DIAGNOSIS — Z951 Presence of aortocoronary bypass graft: Secondary | ICD-10-CM | POA: Diagnosis not present

## 2021-12-22 DIAGNOSIS — I272 Pulmonary hypertension, unspecified: Secondary | ICD-10-CM | POA: Diagnosis not present

## 2021-12-22 DIAGNOSIS — Z9049 Acquired absence of other specified parts of digestive tract: Secondary | ICD-10-CM | POA: Diagnosis not present

## 2021-12-22 DIAGNOSIS — G4733 Obstructive sleep apnea (adult) (pediatric): Secondary | ICD-10-CM | POA: Diagnosis not present

## 2021-12-22 DIAGNOSIS — I10 Essential (primary) hypertension: Secondary | ICD-10-CM | POA: Diagnosis not present

## 2021-12-22 DIAGNOSIS — I70213 Atherosclerosis of native arteries of extremities with intermittent claudication, bilateral legs: Secondary | ICD-10-CM | POA: Diagnosis not present

## 2021-12-22 DIAGNOSIS — I252 Old myocardial infarction: Secondary | ICD-10-CM | POA: Diagnosis not present

## 2021-12-22 DIAGNOSIS — I2510 Atherosclerotic heart disease of native coronary artery without angina pectoris: Secondary | ICD-10-CM | POA: Diagnosis not present

## 2021-12-22 DIAGNOSIS — E782 Mixed hyperlipidemia: Secondary | ICD-10-CM

## 2021-12-22 DIAGNOSIS — Z87891 Personal history of nicotine dependence: Secondary | ICD-10-CM | POA: Diagnosis not present

## 2021-12-22 DIAGNOSIS — Z955 Presence of coronary angioplasty implant and graft: Secondary | ICD-10-CM | POA: Diagnosis not present

## 2021-12-22 LAB — BASIC METABOLIC PANEL
Anion gap: 8 (ref 5–15)
BUN: 17 mg/dL (ref 8–23)
CO2: 27 mmol/L (ref 22–32)
Calcium: 9.2 mg/dL (ref 8.9–10.3)
Chloride: 102 mmol/L (ref 98–111)
Creatinine, Ser: 0.82 mg/dL (ref 0.61–1.24)
GFR, Estimated: >60 mL/min (ref >60)
Glucose, Bld: 107 mg/dL — ABNORMAL HIGH (ref 70–99)
Potassium: 3.8 mmol/L (ref 3.5–5.1)
Sodium: 137 mmol/L (ref 135–145)

## 2021-12-22 LAB — LIPID PANEL
Cholesterol: 99 mg/dL (ref 0–200)
HDL: 38 mg/dL — ABNORMAL LOW (ref >40)
LDL Cholesterol: 49 mg/dL (ref 0–99)
Total CHOL/HDL Ratio: 2.6 RATIO
Triglycerides: 59 mg/dL (ref <150)
VLDL: 12 mg/dL (ref 0–40)

## 2021-12-22 LAB — CBC
HCT: 37.4 % — ABNORMAL LOW (ref 39.0–52.0)
Hemoglobin: 12.9 g/dL — ABNORMAL LOW (ref 13.0–17.0)
MCH: 31.2 pg (ref 26.0–34.0)
MCHC: 34.5 g/dL (ref 30.0–36.0)
MCV: 90.6 fL (ref 80.0–100.0)
Platelets: 100 10*3/uL — ABNORMAL LOW (ref 150–400)
RBC: 4.13 MIL/uL — ABNORMAL LOW (ref 4.22–5.81)
RDW: 15.7 % — ABNORMAL HIGH (ref 11.5–15.5)
WBC: 7.9 10*3/uL (ref 4.0–10.5)
nRBC: 0 % (ref 0.0–0.2)

## 2021-12-22 MED ORDER — ASPIRIN 81 MG PO TBEC
81.0000 mg | DELAYED_RELEASE_TABLET | Freq: Every day | ORAL | 0 refills | Status: AC
  Filled 2021-12-22: qty 30, 30d supply, fill #0

## 2021-12-22 MED ORDER — CLOPIDOGREL BISULFATE 75 MG PO TABS
75.0000 mg | ORAL_TABLET | Freq: Every day | ORAL | 3 refills | Status: DC
  Filled 2021-12-22: qty 90, 90d supply, fill #0

## 2021-12-22 MED ORDER — APIXABAN 5 MG PO TABS
5.0000 mg | ORAL_TABLET | Freq: Two times a day (BID) | ORAL | Status: DC
  Filled 2021-12-22: qty 1

## 2021-12-22 NOTE — Discharge Instructions (Addendum)

## 2021-12-22 NOTE — Discharge Summary (Addendum)
Discharge Summary    Patient ID: [name] MRN: [id number]; DOB: January 31, 1941  Admit date: 12/21/2021 Discharge date: 12/22/2021  PCP:  Lavone Orn, [facility] Providers Cardiologist:  [name], MD   Discharge Diagnoses    Principal Problem:   Claudication in peripheral vascular disease [facility]) Active Problems:   Atrial fibrillation ([place])   Chronic anticoagulation   Carotid stenosis, asymptomatic   CAD S/P percutaneous coronary angioplasty   Essential hypertension   Dyslipidemia, goal LDL below 70   S/P CABG x 4   Edema of leg   Peripheral arterial disease ([place])   Obstructive sleep apnea    Diagnostic Studies/Procedures    Abdominal aortogram with B common iliac artery orbital atherectomy and stenting 12/21/21 Final Impression: Successful bilateral common iliac artery orbital atherectomy followed by VBX covered stenting for hemodynamically significant calcified iliac stenoses with setting of lifestyle-limiting claudication.  He does have significant infrainguinal disease in his SFAs bilaterally.  The sheath will be removed once ACT falls below 170 and pressure be held.  He will be hydrated overnight.  He can be discharged home tomorrow morning if he remains stable overnight on "triple therapy" including Eliquis, baby aspirin and clopidogrel for 30 days after which the aspirin can be discontinued.  We will get lower extremity arterial Doppler studies in unrefined office next week and I will see him back 1 to 2 weeks thereafter. _____________   History of Present Illness     [name] is a 81 y.o. male with a history of CAD status post RCA stenting January 11, 1998. He had normal circumflex, LAD and ejection fraction. His other problems include hypertension and chronic atrial fibrillation on Coumadin anticoagulation, rate controlled, as well as erectile dysfunction. He is totally asymptomatic. He did have colon cancer picked up on screening  colonoscopy and underwent right colectomy May 29, 2006 followed by 11 cycles of adjuvant Folfox chemotherapy. [name] He is very active exercises 6 days a week doing weight training 3 days a week, and lower extremity training 3 times a week.[name]   He was admitted to the hospital on 01/21/2018 with non-STEMI.  He underwent cardiac catheterization 2 days later by Dr. Saunders Revel revealing left main disease but disease in his LAD and RCA as well.  He underwent CABG by Dr. Cyndia Bent on 01/28/2018 with a LIMA to his LAD, vein to an obtuse marginal branch and to the PDA and PLA sequentially.  He also had left atrial clipping at that time.       He saw [name], NP in the office 12/14/2020 who placed him on a low-dose diuretic and obtain a 2D echocardiogram which was performed 12/20/2020 revealing severe TR with pulmonary hypertension which is new for him.  He is fairly active but does complain of dyspnea on exertion which is a new symptom.     His symptoms of dyspnea significantly improved with the addition of a diuretic.  Chest CTA showed no evidence of thromboembolic disease.  In addition, I did lower extremity arterial Doppler studies that show significant iliac disease bilaterally.  He does complain of lifestyle limiting claudication.  He had a right heart cath performed by Dr. Haroldine Laws 06/14/2021 that showed mild pulmonary hypertension although he had V waves in both the right atrium and the wedge positions suggesting MR and TR.  He had a sleep study that showed severe obstructive sleep apnea.  He does have an appointment with Dr. Haroldine Laws at the end  of May to discuss the right heart findings.  He may need a cardiac MRI and a TEE to further evaluate.  A chest CTA was negative for pulmonary thromboembolic disease as well as a VQ scan.  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   He  was diuresed with the addition of metolazone. His peripheral edema has completely resolved.  His dyspnea has resolved as well and he feels clinically improved.  He currently denies chest pain or shortness of breath.  His major complaint is lifestyle-limiting claudication with Doppler studies performed 04/02/2021 that revealed bilateral iliac disease which he wishes to have addressed endovascularly.    Hospital Course     Consultants: none  PAD He presented for scheduled abdominal aortogram 12/21/21 with Dr. Gwenlyn Found. Bilateral SFAs were occluded. He underwent orbital atherectomy and PTA with covered stenting using VBX covered stent. Pt tolerated the procedure well and was placed on triple therapy with ASA, plavix, and eliquis x 30 days, then stop ASA. He will then remain on plavix and eliquis.  He has ambulated this morning with stable cath site.   Atrial fibrillation Chronic anticoagulation CAD s/p CABG with LAA Stop ASA after 30 days Continue eliquis   Hyperlipidemia with LDL goal < 70 12/22/2021: Cholesterol 99; HDL 38; LDL Cholesterol 49; Triglycerides 59; VLDL 12 Continue home medications.   Hypertension Continue PTA medications   Pulmonary hypertension MR/TR Mild PH on RHC 05/2021 Follow valvular disease on echo Continue diuretic regimen   Pt seen and examined by Dr. Irish Lack and deemed stable for discharge. Follow up has been arranged with Dr. Gwenlyn Found.     Did the patient have an acute coronary syndrome (MI, NSTEMI, STEMI, etc) this admission?:  No                               Did the patient have a percutaneous coronary intervention (stent / angioplasty)?:  No.       _____________  Discharge Vitals Blood pressure (!) 134/91, pulse (!) 55, temperature 98.2 F (36.8 C), temperature source Oral, resp. rate 20, height '6\' 3"'$  (1.905 m), weight 97.7 kg, SpO2 96 %.  Filed Weights   12/21/21 0617 12/21/21 1414  Weight: 95.7 kg 97.7 kg    Labs & Radiologic Studies     CBC Recent Labs    12/22/21 0144  WBC 7.9  HGB 12.9*  HCT 37.4*  MCV 90.6  PLT 009*   Basic Metabolic Panel Recent Labs    12/22/21 0144  NA 137  K 3.8  CL 102  CO2 27  GLUCOSE 107*  BUN 17  CREATININE 0.82  CALCIUM 9.2   Liver Function Tests No results for input(s): "AST", "ALT", "ALKPHOS", "BILITOT", "PROT", "ALBUMIN" in the last 72 hours. No results for input(s): "LIPASE", "AMYLASE" in the last 72 hours. High Sensitivity Troponin:   No results for input(s): "TROPONINIHS" in the last 720 hours.  BNP Invalid input(s): "POCBNP" D-Dimer No results for input(s): "DDIMER" in the last 72 hours. Hemoglobin A1C No results for input(s): "HGBA1C" in the last 72 hours. Fasting Lipid Panel Recent Labs    12/22/21 0144  CHOL 99  HDL 38*  LDLCALC 49  TRIG 59  CHOLHDL 2.6   Thyroid Function Tests No results for input(s): "TSH", "T4TOTAL", "T3FREE", "THYROIDAB" in the last 72 hours.  Invalid input(s): "FREET3" _____________  PERIPHERAL VASCULAR CATHETERIZATION  Result Date: 12/21/2021 Images from the original result were not included.  [id number] LOCATION:  FACILITY: [facility] PHYSICIAN: [name], M.D. 09-23-1940 DATE OF PROCEDURE:  12/21/2021 DATE OF DISCHARGE: PV Angiogram/Intervention History obtained from chart review.[name] is a 81 y.o.  mildly overweight, married Caucasian male father of 2, grandfather of 3 grandchildren who I saw  in the office 10/31/2021.[name] He has a history of CAD status post RCA stenting by myself January 11, 1998. He had normal circumflex, LAD and ejection fraction. His other problems include hypertension and chronic atrial fibrillation on Coumadin anticoagulation, rate controlled, as well as erectile dysfunction. He is totally asymptomatic. He did have colon cancer picked up on screening colonoscopy and underwent right colectomy May 29, 2006 followed by 11 cycles of adjuvant Folfox chemotherapy. [name] He is very active exercises 6 days a week  doing weight training 3 days a week, and lower extremity training 3 times a week.[name]  He was admitted to the hospital on 01/21/2018 with non-STEMI.  He underwent cardiac catheterization 2 days later by Dr. Saunders Revel revealing left main disease but disease in his LAD and RCA as well.  He underwent CABG by Dr. Cyndia Bent on 01/28/2018 with a LIMA to his LAD, vein to an obtuse marginal branch and to the PDA and PLA sequentially.  He also had left atrial clipping at that time.    He ended up completing cardiac rehab as an outpatient.  He saw [name], NP in the office 12/14/2020 who placed him on a low-dose diuretic and obtain a 2D echocardiogram which was performed 12/20/2020 revealing severe TR with pulmonary hypertension which is new for him.  He is fairly active but does complain of dyspnea on exertion which is a new symptom.   His symptoms of dyspnea significantly improved with the addition of a diuretic.  I did go chest CTA that showed no evidence of thromboembolic disease.  In addition, I did lower extremity arterial Doppler studies that show significant iliac disease bilaterally.  He does complain of lifestyle limiting claudication. He had a right heart cath performed by Dr. Haroldine Laws 06/14/2021 that showed mild pulmonary hypertension although he had V waves in both the right atrium and the wedge positions suggesting MR and TR.  He had a sleep study that showed severe obstructive sleep apnea.  He does have an appointment with Dr. Haroldine Laws at the end of May to discuss the right heart findings.  He may need a cardiac MRI and a TEE to further evaluate.  A chest CTA was negative for pulmonary thromboembolic disease as well as a VQ scan.  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  Since I saw him a month ago he has lost significant mount of weight as a result of the needed metolazone  which I placed him on.  His peripheral edema has completely resolved.  His dyspnea has resolved as well and he feels clinically improved.  He currently denies chest pain or shortness of breath.  His major complaint is lifestyle-limiting claudication with Doppler studies performed 04/02/2021 that revealed bilateral iliac disease which he wishes to have addressed endovascularly. Pre Procedure Diagnosis: Peripheral arterial disease Post Procedure Diagnosis: Peripheral arterial disease Operators: Dr. [name] Procedures Performed:  1.  Right and left common femoral artery access  2.  Abdominal aortogram/bilateral iliac angiogram/bifemoral  runoff  3.  Pullback gradient across right left common iliac artery with endhole catheter after administration of intra-arterial nitroglycerin  4.  Orbital atherectomy right and left common iliac artery             5.  VBX covered stenting right left common iliac artery PROCEDURE DESCRIPTION: The patient was brought to the second floor [place] [facility] cath lab in the the postabsorptive state. He was premedicated with IV Versed and fentanyl. His right and left groins were prepped and shaved in usual sterile fashion. Xylocaine 1% was used for local anesthesia. A 5 French sheath was inserted into the right and left common femoral arteries using standard Seldinger technique.  A 5 French pigtail catheter was placed in distal abdominal aorta.  Distal abdominal aortography, bilateral iliac angiography with bifemoral runoff was performed using bolus chase, digital subtraction and step table technique.  Omnipaque dye was used for the entirety of the case (230 cc of contrast totaled the patient.).  Retrograde ordered pressures monitored during the case.  A pullback gradient was performed across both right and left common iliac arteries using a 5 French straight endhole catheter after administration of 200 mcg of intra-arterial nitroglycerin revealing a 30 mm gradient on the right and the  left respectively.  Angiographic Data: 1: Abdominal aorta-renal arteries widely patent.  Infrarenal abdominal aorta was moderately atherosclerotic 2: Left lower extremity-60% hypodense proximal eccentric calcified left common iliac artery with a 30 mm pullback gradient.  There was an 80% calcified proximal left SFA, 90% calcified distal left SFA with two-vessel runoff.  The peroneal was occluded. 3: Right lower extremity-80% eccentric calcified proximal right common iliac artery stenosis with a 30 mill per pullback gradient.  75% calcified eccentric proximal right SFA.  95% calcified exophytic tandem mid right SFA stenosis with three-vessel runoff   [name] has hemodynamically significant calcified bilateral common iliac artery stenoses.  We will proceed with orbital atherectomy followed by PTA and covered stenting using VBX covered stent. Procedure Description: The 5 French sheath were exchanged over 035 versa core wires for a 7 [place] Brite tip sheath.  Patient received a total of 12,500' units of heparin with an ACT at the end of 251.  I performed orbital atherectomy over an 014/017 Viper wire with a 2 mm solid CSI bur up to 120,000 RPMs I then performed PTA and covered stenting using 7 mm x 29 mm long VBX covered stents on both sides.  I performed completion abdominal aortography.  Then exchanged the 7 [place] Brite tip sheath for short 7 [place] sheaths and secured in place.  The patient received 300 mg of p.o. clopidogrel at the end of the case. Final Impression: Successful bilateral common iliac artery orbital atherectomy followed by VBX covered stenting for hemodynamically significant calcified iliac stenoses with setting of lifestyle-limiting claudication.  He does have significant infrainguinal disease in his SFAs bilaterally.  The sheath will be removed once ACT falls below 170 and pressure be held.  He will be hydrated overnight.  He can be discharged home tomorrow morning if he remains stable  overnight on "triple therapy" including Eliquis, baby aspirin and clopidogrel for 30 days after which the aspirin can be discontinued.  We will get lower extremity arterial Doppler studies in unrefined office next week and I will see him back 1 to 2 weeks thereafter. [name]. MD, [facility] 12/21/2021 10:00 AM    Disposition   Pt is being discharged home today in good condition.  Follow-up Plans & Appointments     Follow-up Information     Lorretta Harp, MD Follow up on 01/10/2022.   Specialties: Cardiology, Radiology Why: 1:30 Contact information: [address] [place] [address] [phone]         [facility] [place] Follow up on 01/05/2022.   Specialty: Cardiology Why: 1:30 Contact information: [place] [phone] [place] [phone]               Discharge Instructions     Diet - low sodium heart healthy   Complete by: As directed    Discharge instructions   Complete by: As directed    No driving for 2 days. No lifting over 5 lbs for 1 week. No sexual activity for 1 week. Keep procedure site clean & dry. If you notice increased pain, swelling, bleeding or pus, call/return!  You may shower, but no soaking baths/hot tubs/pools for 1 week.   Increase activity slowly   Complete by: As directed         Discharge Medications   Allergies as of 12/22/2021   No Known Allergies      Medication List     STOP taking these medications    aspirin 81 MG tablet Replaced by: aspirin EC 81 MG tablet       TAKE these medications    Alpha-Lipoic Acid 600 MG Tabs Take 600 mg by mouth 2 (two) times daily.   ascorbic acid 500 MG tablet Commonly known as: VITAMIN C Take 500 mg by mouth 3 (three) times daily.   aspirin EC 81 MG tablet Take 1 tablet (81 mg total) by mouth daily. Replaces: aspirin 81 MG tablet   atorvastatin 80 MG  tablet Commonly known as: LIPITOR Take 80 mg by mouth daily before supper.   cholecalciferol 1000 units tablet Commonly known as: VITAMIN D Take 1,000 Units by mouth daily.   clopidogrel 75 MG tablet Commonly known as: PLAVIX Take 1 tablet (75 mg total) by mouth daily with breakfast.   Coenzyme Q10 200 MG capsule Take 200 mg by mouth daily.   Eliquis 5 MG Tabs tablet Generic drug: apixaban TAKE 1 TABLET BY MOUTH  TWICE DAILY   FLAX SEED OIL PO Take 200 mg by mouth daily.   furosemide 40 MG tablet Commonly known as: LASIX Take 1 tablet (40 mg total) by mouth in the morning AND 1 tablet (40 mg total) every evening.   Magnesium Citrate 200 MG Tabs Take 400 mg by mouth daily in the afternoon.   metolazone 2.5 MG tablet Commonly known as: ZAROXOLYN Take 1 tablet (2.5 mg total) by mouth 2 (two) times a week. Every Mon and Fri   pyridoxine 200 MG tablet Commonly known as: B-6 Take 200 mg by mouth daily.   spironolactone 25 MG tablet Commonly known as: ALDACTONE Take 0.5 tablets (12.5 mg total) by mouth daily.   Systane 0.4-0.3 % Gel ophthalmic gel Generic drug: Polyethyl Glycol-Propyl Glycol Place 1 application. into both eyes every 8 (eight) hours as needed (dry eyes).   VITAMIN B 12 PO Take 200 mcg by mouth daily.           Outstanding Labs/Studies   LEA's scheduled  Duration of Discharge Encounter   Greater than 30 minutes including physician time.  Signed, Tami Lin Duke, PA 12/22/2021, 9:39 AM   I have examined the patient and reviewed  assessment and plan and discussed with patient.  Agree with above as stated.    GEN: Well nourished, well developed, in no acute distress  HEENT: normal  Neck: no JVD, carotid bruits, or masses Cardiac: RRR; no murmurs, rubs, or gallops,; mild ankle edema  Respiratory:  clear to auscultation bilaterally, normal work of breathing GI: soft, nontender, nondistended,  MS: no deformity or atrophy ; no groin  hematoma; dressing clean/dry and intact Skin: warm and dry, psoriasis on both legs Neuro:  Strength and sensation are intact Psych: euthymic mood, full affect  Continue antiplatelet therapy. F/u Dopplers scheduled. Secondary prevention with statin planned. Triple therapy fro one month.  Stop aspirin after 30 days/OK for discharge.   20 minutes spent including discussing meds, f/u and other issues like lower extremity edema and varicose veins with the patient.   Larae Grooms

## 2021-12-22 NOTE — Progress Notes (Signed)
Mobility Specialist - Progress Note   12/22/21 0921  Mobility  Activity Ambulated with assistance in hallway  Activity Response Tolerated well  Distance Ambulated (ft) 230 ft  $Mobility charge 1 Mobility  Level of Assistance Standby assist, set-up cues, supervision of patient - no hands on  Assistive Device None  Mobility Referral Yes    Pre-mobility:86 HR During mobility:109 HR Post-mobility:60 HR  Pt was received in chair and agreeable to mobility. Pt c/o legs feeling "tired" throughout ambulation. Pt was returned to EOB with all needs met.   Larey Seat

## 2021-12-28 ENCOUNTER — Telehealth: Payer: Self-pay | Admitting: Physician Assistant

## 2021-12-28 NOTE — Telephone Encounter (Signed)
Pt saw an area of bruising one groin. He saw it today, cannot guarantee that it is new, but has not seen it before and it is still very purple, no color change at all.  He has no pain, no swelling, no numbness, no difficulty moving his legs, no pain with ambulation.   Advised him to outline the area with a sharpie and keep an eye on it.   If it gets larger, call back. If it starts to turn green/yellow, OK.  If nothing changes, keep scheduled f/u.  Will route this to Dr Gwenlyn Found.  Rosaria Ferries, PA-C 12/28/2021 5:29 PM

## 2022-01-01 DIAGNOSIS — G4733 Obstructive sleep apnea (adult) (pediatric): Secondary | ICD-10-CM | POA: Diagnosis not present

## 2022-01-01 DIAGNOSIS — I10 Essential (primary) hypertension: Secondary | ICD-10-CM | POA: Diagnosis not present

## 2022-01-05 ENCOUNTER — Ambulatory Visit (HOSPITAL_BASED_OUTPATIENT_CLINIC_OR_DEPARTMENT_OTHER)
Admission: RE | Admit: 2022-01-05 | Discharge: 2022-01-05 | Disposition: A | Discharge status: Home / Self Care | Payer: Medicare Other | Source: Ambulatory Visit | Attending: Cardiovascular Disease | Admitting: Cardiovascular Disease

## 2022-01-05 ENCOUNTER — Ambulatory Visit (HOSPITAL_COMMUNITY)
Admission: RE | Admit: 2022-01-05 | Discharge: 2022-01-05 | Disposition: A | Discharge status: Home / Self Care | Payer: Medicare Other | Source: Ambulatory Visit | Attending: Cardiovascular Disease | Admitting: Cardiovascular Disease

## 2022-01-05 DIAGNOSIS — Z95820 Peripheral vascular angioplasty status with implants and grafts: Secondary | ICD-10-CM | POA: Diagnosis not present

## 2022-01-05 DIAGNOSIS — I739 Peripheral vascular disease, unspecified: Secondary | ICD-10-CM | POA: Insufficient documentation

## 2022-01-10 ENCOUNTER — Ambulatory Visit: Payer: Medicare Other | Attending: Cardiovascular Disease | Admitting: Cardiovascular Disease

## 2022-01-10 ENCOUNTER — Encounter: Payer: Self-pay | Admitting: Cardiovascular Disease

## 2022-01-10 DIAGNOSIS — I739 Peripheral vascular disease, unspecified: Secondary | ICD-10-CM | POA: Diagnosis not present

## 2022-01-10 NOTE — Patient Instructions (Signed)
Medication Instructions:  Your physician recommends that you continue on your current medications as directed. Please refer to the Current Medication list given to you today.  *If you need a refill on your cardiac medications before your next appointment, please call your pharmacy*   Testing/Procedures: Your physician has requested that you have an Aorta/Iliac Duplex. This will be take place at [place], [address].  No food after 11PM the night before.  Water is OK. (Don't drink liquids if you have been instructed not to for ANOTHER test) Avoid foods that produce bowel gas, for 24 hours prior to exam (see below). No breakfast, no chewing gum, no smoking or carbonated beverages. Patient may take morning medications with water. Come in for test at least 15 minutes early to register. To be done in April 2024.   Your physician has requested that you have a lower extremity arterial duplex. This test is an ultrasound of the arteries in the legs. It looks at arterial blood flow in the legs. Allow one hour for Lower Arterial scans. There are no restrictions or special instructions.  To be done in April 2024. This procedure will be done at [place]   Your physician has requested that you have an ankle brachial index (ABI). During this test an ultrasound and blood pressure cuff are used to evaluate the arteries that supply the arms and legs with blood. Allow thirty minutes for this exam. There are no restrictions or special instructions. To be done in April 2024. This procedure will be done at [place]. [address]    Follow-Up: At [facility], you and your health needs are our priority.  As part of our continuing mission to provide you with exceptional heart care, we have created designated Provider Care Teams.  These Care Teams include your primary Cardiologist (physician) and Advanced Practice Providers (APPs -  Physician Assistants and Nurse  Practitioners) who all work together to provide you with the care you need, when you need it.  We recommend signing up for the patient portal called "MyChart".  Sign up information is provided on this After Visit Summary.  MyChart is used to connect with patients for Virtual Visits (Telemedicine).  Patients are able to view lab/test results, encounter notes, upcoming appointments, etc.  Non-urgent messages can be sent to your provider as well.   To learn more about what you can do with MyChart, go to [email or web address].    Your next appointment:   6 month(s)  The format for your next appointment:   In Person  Provider:   [name], MD

## 2022-01-10 NOTE — Assessment & Plan Note (Signed)
Mr. Rosana Hoes returns today after his recent peripheral angiogram and endovascular procedure performed 12/21/2021 for claudication.  I put VBX stents in both iliac arteries.  He did have SFA disease as well.  His claudication is markedly improved.  Postprocedure Dopplers performed 01/05/2022 revealed his stents to be widely patent.  At this point, we will treat his SFA disease medically.  We will repeat Doppler studies in 6 months after which I will see him back in follow-up.

## 2022-01-10 NOTE — Progress Notes (Signed)
01/10/2022 [name]   11-May-1940  [id number]  Primary Physician Lavone Orn, MD Primary Cardiologist: Lorretta Harp MD FACP, [place], [place], [place]  HPI:  [name] is a 81 y.o.  mildly overweight, married Caucasian male father of 2, grandfather of 3 grandchildren who I saw  in the office 12/12/2021.[name] He has a history of CAD status post RCA stenting by myself January 11, 1998. He had normal circumflex, LAD and ejection fraction. His other problems include hypertension and chronic atrial fibrillation on Coumadin anticoagulation, rate controlled, as well as erectile dysfunction. He is totally asymptomatic. He did have colon cancer picked up on screening colonoscopy and underwent right colectomy May 29, 2006 followed by 11 cycles of adjuvant Folfox chemotherapy. [name] He is very active exercises 6 days a week doing weight training 3 days a week, and lower extremity training 3 times a week.[name]   He was admitted to the hospital on 01/21/2018 with non-STEMI.  He underwent cardiac catheterization 2 days later by Dr. Saunders Revel revealing left main disease but disease in his LAD and RCA as well.  He underwent CABG by Dr. Cyndia Bent on 01/28/2018 with a LIMA to his LAD, vein to an obtuse marginal branch and to the PDA and PLA sequentially.  He also had left atrial clipping at that time.    He ended up completing cardiac rehab as an outpatient.   He saw [name], NP in the office 12/14/2020 who placed him on a low-dose diuretic and obtain a 2D echocardiogram which was performed 12/20/2020 revealing severe TR with pulmonary hypertension which is new for him.  He is fairly active but does complain of dyspnea on exertion which is a new symptom.     His symptoms of dyspnea significantly improved with the addition of a diuretic.  I did go chest CTA that showed no evidence of thromboembolic disease.  In addition, I did lower extremity arterial Doppler studies that show significant iliac disease bilaterally.   He does complain of lifestyle limiting claudication.  He had a right heart cath performed by Dr. Haroldine Laws 06/14/2021 that showed mild pulmonary hypertension although he had V waves in both the right atrium and the wedge positions suggesting MR and TR.  He had a sleep study that showed severe obstructive sleep apnea.  He does have an appointment with Dr. Haroldine Laws at the end of May to discuss the right heart findings.  He may need a cardiac MRI and a TEE to further evaluate.  A chest CTA was negative for pulmonary thromboembolic disease as well as a VQ scan.  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   He has lost significant mount of weight as a result of the needed metolazone which I placed him on.  His peripheral edema has completely resolved.  His dyspnea has resolved as well and he feels clinically improved.  He currently denies chest pain or shortness of breath.  His major complaint is lifestyle-limiting claudication with Doppler studies performed 04/02/2021 that revealed bilateral iliac disease .  I performed peripheral angiography on him 12/21/2021 revealing significant bilateral iliac disease which I stented with a 7 mm x 29 mm long VBX covered stents.  He did have tandem lesions in both SFAs.  He was discharged home the following day.  His claudication is markedly improved.  Postprocedural Doppler studies performed 01/05/2022 revealed  the stents to be widely patent.  Current Meds  Medication Sig   Alpha-Lipoic Acid 600 MG TABS Take 600 mg by mouth 2 (two) times daily.   apixaban (ELIQUIS) 5 MG TABS tablet TAKE 1 TABLET BY MOUTH  TWICE DAILY   aspirin EC 81 MG tablet Take 1 tablet (81 mg total) by mouth daily. Stop after 30 days.   atorvastatin (LIPITOR) 80 MG tablet Take 80 mg by mouth daily before supper.   cholecalciferol (VITAMIN D) 1000 UNITS tablet Take  1,000 Units by mouth daily.   clopidogrel (PLAVIX) 75 MG tablet Take 1 tablet (75 mg total) by mouth daily with breakfast.   Coenzyme Q10 200 MG capsule Take 200 mg by mouth daily.    Cyanocobalamin (VITAMIN B 12 PO) Take 200 mcg by mouth daily.   Flaxseed, Linseed, (FLAX SEED OIL PO) Take 200 mg by mouth daily.   furosemide (LASIX) 40 MG tablet Take 1 tablet (40 mg total) by mouth in the morning AND 1 tablet (40 mg total) every evening.   Magnesium Citrate 200 MG TABS Take 400 mg by mouth daily in the afternoon.   metolazone (ZAROXOLYN) 2.5 MG tablet Take 1 tablet (2.5 mg total) by mouth 2 (two) times a week. Every Mon and Fri   Polyethyl Glycol-Propyl Glycol (SYSTANE) 0.4-0.3 % GEL ophthalmic gel Place 1 application. into both eyes every 8 (eight) hours as needed (dry eyes).   pyridoxine (B-6) 200 MG tablet Take 200 mg by mouth daily.   spironolactone (ALDACTONE) 25 MG tablet Take 0.5 tablets (12.5 mg total) by mouth daily.   vitamin C (ASCORBIC ACID) 500 MG tablet Take 500 mg by mouth 3 (three) times daily.    Current Facility-Administered Medications for the 01/10/22 encounter (Office Visit) with Lorretta Harp, MD  Medication   sodium chloride flush (NS) 0.9 % injection 3 mL     No Known Allergies  Social History   Socioeconomic History   Marital status: Married    Spouse name: [name]   Number of children: 2   Years of education: Not on file   Highest education level: Bachelor's degree (e.g., BA, AB, BS)  Occupational History   Occupation: retired    Fish farm manager: RETIRED  Tobacco Use   Smoking status: Former    Years: 30.00    Types: Cigarettes    Quit date: 11/25/1993    Years since quitting: 28.1   Smokeless tobacco: Never  Vaping Use   Vaping Use: Never used  Substance and Sexual Activity   Alcohol use: Yes    Alcohol/week: 1.0 standard drink of alcohol    Types: 1 Cans of beer per week   Drug use: No   Sexual activity: Not on file  Other Topics Concern   Not  on file  Social History Narrative   Not on file   Social Determinants of Health   Financial Resource Strain: Low Risk  (04/08/2018)   Overall Financial Resource Strain (CARDIA)    Difficulty of Paying Living Expenses: Not hard at all  Food Insecurity: No Food Insecurity (12/22/2021)   Hunger Vital Sign    Worried About Running Out of Food in the Last Year: Never true    Ran Out of Food in the Last Year: Never true  Transportation Needs: No Transportation Needs (12/22/2021)   PRAPARE - Hydrologist (Medical): No    Lack of Transportation (Non-Medical): No  Physical Activity: Unknown (04/08/2018)   Exercise Vital Sign  Days of Exercise per Week: 3 days    Minutes of Exercise per Session: Not asked  Stress: Stress Concern Present (04/08/2018)   [place]    Feeling of Stress : To some extent  Social Connections: Not on file  Intimate Partner Violence: Not At Risk (12/22/2021)   Humiliation, Afraid, Rape, and Kick questionnaire    Fear of Current or Ex-Partner: No    Emotionally Abused: No    Physically Abused: No    Sexually Abused: No     Review of Systems: General: negative for chills, fever, night sweats or weight changes.  Cardiovascular: negative for chest pain, dyspnea on exertion, edema, orthopnea, palpitations, paroxysmal nocturnal dyspnea or shortness of breath Dermatological: negative for rash Respiratory: negative for cough or wheezing Urologic: negative for hematuria Abdominal: negative for nausea, vomiting, diarrhea, bright red blood per rectum, melena, or hematemesis Neurologic: negative for visual changes, syncope, or dizziness All other systems reviewed and are otherwise negative except as noted above.    Blood pressure (!) 132/48, pulse (!) 53, height '6\' 3"'$  (1.905 m), weight 209 lb 6.4 oz (95 kg), SpO2 95 %.  General appearance: alert and no distress Neck: no  adenopathy, no carotid bruit, no JVD, supple, symmetrical, trachea midline, and thyroid not enlarged, symmetric, no tenderness/mass/nodules Lungs: clear to auscultation bilaterally Heart: regular rate and rhythm, S1, S2 normal, no murmur, click, rub or gallop Extremities: extremities normal, atraumatic, no cyanosis or edema Pulses: 2+ and symmetric Skin: Skin color, texture, turgor normal. No rashes or lesions Neurologic: Grossly normal  EKG not performed today  ASSESSMENT AND PLAN:   Claudication in peripheral vascular disease [facility]) Mr. Rosana Hoes returns today after his recent peripheral angiogram and endovascular procedure performed 12/21/2021 for claudication.  I put VBX stents in both iliac arteries.  He did have SFA disease as well.  His claudication is markedly improved.  Postprocedure Dopplers performed 01/05/2022 revealed his stents to be widely patent.  At this point, we will treat his SFA disease medically.  We will repeat Doppler studies in 6 months after which I will see him back in follow-up.     Lorretta Harp MD FACP,FACC,FAHA, [facility] 01/10/2022 1:34 PM

## 2022-01-11 DIAGNOSIS — I482 Chronic atrial fibrillation, unspecified: Secondary | ICD-10-CM | POA: Diagnosis not present

## 2022-01-11 DIAGNOSIS — Z0000 Encounter for general adult medical examination without abnormal findings: Secondary | ICD-10-CM | POA: Diagnosis not present

## 2022-01-11 DIAGNOSIS — I739 Peripheral vascular disease, unspecified: Secondary | ICD-10-CM | POA: Diagnosis not present

## 2022-01-11 DIAGNOSIS — G4733 Obstructive sleep apnea (adult) (pediatric): Secondary | ICD-10-CM | POA: Diagnosis not present

## 2022-01-11 DIAGNOSIS — E782 Mixed hyperlipidemia: Secondary | ICD-10-CM | POA: Diagnosis not present

## 2022-01-11 DIAGNOSIS — Z85038 Personal history of other malignant neoplasm of large intestine: Secondary | ICD-10-CM | POA: Diagnosis not present

## 2022-01-11 DIAGNOSIS — I779 Disorder of arteries and arterioles, unspecified: Secondary | ICD-10-CM | POA: Diagnosis not present

## 2022-01-11 DIAGNOSIS — I272 Pulmonary hypertension, unspecified: Secondary | ICD-10-CM | POA: Diagnosis not present

## 2022-01-15 DIAGNOSIS — R0683 Snoring: Secondary | ICD-10-CM | POA: Diagnosis not present

## 2022-01-15 DIAGNOSIS — G4733 Obstructive sleep apnea (adult) (pediatric): Secondary | ICD-10-CM | POA: Diagnosis not present

## 2022-01-15 DIAGNOSIS — I10 Essential (primary) hypertension: Secondary | ICD-10-CM | POA: Diagnosis not present

## 2022-01-17 ENCOUNTER — Telehealth (HOSPITAL_COMMUNITY): Payer: Self-pay | Admitting: Vascular Surgery

## 2022-01-17 NOTE — Telephone Encounter (Signed)
Lvm to move appt from 12/27 to 12/28 asked pt to call back confirm appt changes

## 2022-02-05 ENCOUNTER — Encounter (HOSPITAL_COMMUNITY): Payer: Medicare Other

## 2022-02-05 ENCOUNTER — Telehealth (HOSPITAL_COMMUNITY): Payer: Self-pay | Admitting: *Deleted

## 2022-02-05 NOTE — Telephone Encounter (Signed)
Reaching out to patient to offer assistance regarding upcoming cardiac imaging study; pt verbalizes understanding of appt date/time, parking situation and where to check in, and verified current allergies; name and call back number provided for further questions should they arise  Gordy Clement RN Navigator Cardiac Imaging [facility] [phone] office [phone] cell  Patient reports having an MRI in the past without incident.

## 2022-02-06 ENCOUNTER — Other Ambulatory Visit (HOSPITAL_COMMUNITY): Payer: Self-pay | Admitting: Internal Medicine

## 2022-02-06 ENCOUNTER — Ambulatory Visit (HOSPITAL_COMMUNITY)
Admission: RE | Admit: 2022-02-06 | Discharge: 2022-02-06 | Disposition: A | Discharge status: Home / Self Care | Payer: Medicare Other | Source: Ambulatory Visit | Attending: Internal Medicine | Admitting: Internal Medicine

## 2022-02-06 DIAGNOSIS — I272 Pulmonary hypertension, unspecified: Secondary | ICD-10-CM

## 2022-02-06 MED ORDER — GADOBUTROL 1 MMOL/ML IV SOLN
13.0000 mL | Freq: Once | INTRAVENOUS | Status: AC | PRN
  Administered 2022-02-06: 13 mL via INTRAVENOUS

## 2022-02-13 ENCOUNTER — Other Ambulatory Visit: Payer: Self-pay | Admitting: Cardiovascular Disease

## 2022-02-15 DIAGNOSIS — I10 Essential (primary) hypertension: Secondary | ICD-10-CM | POA: Diagnosis not present

## 2022-02-15 DIAGNOSIS — G4733 Obstructive sleep apnea (adult) (pediatric): Secondary | ICD-10-CM | POA: Diagnosis not present

## 2022-02-15 DIAGNOSIS — R0683 Snoring: Secondary | ICD-10-CM | POA: Diagnosis not present

## 2022-03-14 ENCOUNTER — Encounter (HOSPITAL_COMMUNITY): Payer: Medicare Other | Admitting: Internal Medicine

## 2022-03-15 ENCOUNTER — Encounter (HOSPITAL_COMMUNITY): Payer: Medicare Other | Admitting: Internal Medicine

## 2022-03-15 ENCOUNTER — Other Ambulatory Visit (HOSPITAL_COMMUNITY): Payer: Self-pay

## 2022-03-17 DIAGNOSIS — I10 Essential (primary) hypertension: Secondary | ICD-10-CM | POA: Diagnosis not present

## 2022-03-17 DIAGNOSIS — G4733 Obstructive sleep apnea (adult) (pediatric): Secondary | ICD-10-CM | POA: Diagnosis not present

## 2022-03-17 DIAGNOSIS — R0683 Snoring: Secondary | ICD-10-CM | POA: Diagnosis not present

## 2022-03-26 ENCOUNTER — Telehealth (HOSPITAL_COMMUNITY): Payer: Self-pay

## 2022-03-26 NOTE — Telephone Encounter (Signed)
Patient aware of results.

## 2022-03-28 ENCOUNTER — Other Ambulatory Visit: Payer: Self-pay | Admitting: Cardiovascular Disease

## 2022-03-28 DIAGNOSIS — I4821 Permanent atrial fibrillation: Secondary | ICD-10-CM

## 2022-03-28 NOTE — Telephone Encounter (Signed)
Prescription refill request for Eliquis received. Indication: Afib  Last office visit: 01/10/22 Gwenlyn Found)  Scr: 0.82 (12/22/21)  Age: 82 Weight: 95kg  Appropriate dose and refill sent to requested pharmacy.

## 2022-04-02 DIAGNOSIS — I10 Essential (primary) hypertension: Secondary | ICD-10-CM | POA: Diagnosis not present

## 2022-04-02 DIAGNOSIS — G4733 Obstructive sleep apnea (adult) (pediatric): Secondary | ICD-10-CM | POA: Diagnosis not present

## 2022-04-07 ENCOUNTER — Encounter (HOSPITAL_COMMUNITY): Payer: Self-pay

## 2022-04-07 ENCOUNTER — Other Ambulatory Visit: Payer: Self-pay

## 2022-04-07 ENCOUNTER — Emergency Department (HOSPITAL_COMMUNITY)
Admission: EM | Admit: 2022-04-07 | Discharge: 2022-04-07 | Disposition: A | Discharge status: Home / Self Care | Payer: Medicare Other | Attending: Emergency Medicine | Admitting: Emergency Medicine

## 2022-04-07 DIAGNOSIS — I10 Essential (primary) hypertension: Secondary | ICD-10-CM | POA: Insufficient documentation

## 2022-04-07 DIAGNOSIS — I83891 Varicose veins of right lower extremities with other complications: Secondary | ICD-10-CM | POA: Diagnosis not present

## 2022-04-07 DIAGNOSIS — Z7902 Long term (current) use of antithrombotics/antiplatelets: Secondary | ICD-10-CM | POA: Insufficient documentation

## 2022-04-07 DIAGNOSIS — M7989 Other specified soft tissue disorders: Secondary | ICD-10-CM | POA: Diagnosis present

## 2022-04-07 DIAGNOSIS — Z79899 Other long term (current) drug therapy: Secondary | ICD-10-CM | POA: Insufficient documentation

## 2022-04-07 DIAGNOSIS — Z743 Need for continuous supervision: Secondary | ICD-10-CM | POA: Diagnosis not present

## 2022-04-07 DIAGNOSIS — I2510 Atherosclerotic heart disease of native coronary artery without angina pectoris: Secondary | ICD-10-CM | POA: Insufficient documentation

## 2022-04-07 DIAGNOSIS — Z7901 Long term (current) use of anticoagulants: Secondary | ICD-10-CM | POA: Insufficient documentation

## 2022-04-07 DIAGNOSIS — R58 Hemorrhage, not elsewhere classified: Secondary | ICD-10-CM | POA: Diagnosis not present

## 2022-04-07 DIAGNOSIS — J449 Chronic obstructive pulmonary disease, unspecified: Secondary | ICD-10-CM | POA: Insufficient documentation

## 2022-04-07 MED ORDER — SILVER NITRATE-POT NITRATE 75-25 % EX MISC
1.0000 | Freq: Once | CUTANEOUS | Status: AC
  Administered 2022-04-07: 1 via TOPICAL
  Filled 2022-04-07: qty 1

## 2022-04-07 NOTE — ED Provider Notes (Signed)
[facility] Provider Note   CSN: [id number] Arrival date & time: 04/07/22  0522     History  Chief Complaint  Patient presents with   Right Lower Leg Bleeding    [name] is a 82 y.o. male.  82 year old male with past medical history of CAD, psoriasis, hyperlipidemia, hypertension, COPD, permanent A-fib, on Plavix and Eliquis presents for bleeding from his right lower leg.  Patient states ever since having his last stent placed in his right leg he has had problems with his psoriasis in his legs.  Generally under control, currently managing with CeraVe a while he awaits appointment with dermatology.  Having leg swelling, not improving with his Lasix.  Noticed bleeding from his leg earlier this morning.  Patient placed a bandage and wrapped with Ace, came to the ER.  Bleeding controlled on arrival.  Denies injuries to the leg.  No other complaints or concerns today.       Home Medications Prior to Admission medications   Medication Sig Start Date End Date Taking? Authorizing Provider  Alpha-Lipoic Acid 600 MG TABS Take 600 mg by mouth 2 (two) times daily.    [provider]  atorvastatin (LIPITOR) 80 MG tablet TAKE 1 TABLET AT 6PM. 02/14/22   Lorretta Harp, MD  cholecalciferol (VITAMIN D) 1000 UNITS tablet Take 1,000 Units by mouth daily.    [provider]  clopidogrel (PLAVIX) 75 MG tablet Take 1 tablet (75 mg total) by mouth daily with breakfast. 12/22/21   [facility], Tami Lin, PA  Coenzyme Q10 200 MG capsule Take 200 mg by mouth daily.     [provider]  Cyanocobalamin (VITAMIN B 12 PO) Take 200 mcg by mouth daily.    [provider]  ELIQUIS 5 MG TABS tablet TAKE 1 TABLET BY MOUTH TWICE  DAILY 03/28/22   Lorretta Harp, MD  Flaxseed, Linseed, (FLAX SEED OIL PO) Take 200 mg by mouth daily.    [provider]  furosemide (LASIX) 40 MG tablet Take 1 tablet (40 mg total) by mouth  in the morning AND 1 tablet (40 mg total) every evening. 07/04/21   Lorretta Harp, MD  Magnesium Citrate 200 MG TABS Take 400 mg by mouth daily in the afternoon.    [provider]  metolazone (ZAROXOLYN) 2.5 MG tablet Take 1 tablet (2.5 mg total) by mouth 2 (two) times a week. Every Mon and Fri 11/16/21   Bensimhon, Shaune Pascal, MD  Polyethyl Glycol-Propyl Glycol (SYSTANE) 0.4-0.3 % GEL ophthalmic gel Place 1 application. into both eyes every 8 (eight) hours as needed (dry eyes).    [provider]  pyridoxine (B-6) 200 MG tablet Take 200 mg by mouth daily.    [provider]  spironolactone (ALDACTONE) 25 MG tablet Take 0.5 tablets (12.5 mg total) by mouth daily. 11/16/21   Bensimhon, Shaune Pascal, MD  vitamin C (ASCORBIC ACID) 500 MG tablet Take 500 mg by mouth 3 (three) times daily.     [provider]  lisinopril (PRINIVIL,ZESTRIL) 20 MG tablet Take 1 tablet (20 mg total) by mouth daily. HOLD UNTIL YOUR APPT WITH DR Glynda Jaeger Patient not taking: No sig reported 02/11/18 01/25/20  Erlene Quan, PA-C      Allergies    Patient has no known allergies.    Review of Systems   Review of Systems Negative except as per HPI Physical Exam Updated Vital Signs BP (!) 143/57  Pulse (!) 37   Temp 98.2 F (36.8 C)   Resp 13   SpO2 100%  Physical Exam Vitals and nursing note reviewed.  Constitutional:      General: He is not in acute distress.    Appearance: He is well-developed. He is not diaphoretic.  HENT:     Head: Normocephalic and atraumatic.  Cardiovascular:     Pulses: Normal pulses.  Pulmonary:     Effort: Pulmonary effort is normal.  Musculoskeletal:     Right lower leg: Edema present.     Left lower leg: Edema present.  Skin:    General: Skin is warm and dry.     Findings: Rash present.     Comments: Psoriasis to bilateral lower extremities Pin point area noted to proximal/lateral lower leg with trace amt of bleeding.  Neurological:     Mental  Status: He is alert and oriented to person, place, and time.  Psychiatric:        Behavior: Behavior normal.     ED Results / Procedures / Treatments   Labs (all labs ordered are listed, but only abnormal results are displayed) Labs Reviewed - No data to display  EKG None  Radiology No results found.  Procedures Procedures    Medications Ordered in ED Medications  silver nitrate applicators applicator 1 Stick (1 Stick Topical Given 04/07/22 0815)    ED Course/ Medical Decision Making/ A&P Clinical Course as of 04/07/22 1326  Sat Apr 07, 2022  0919 This is a 82 year old male with a history of peripheral artery disease, psoriasis, on Plavix and Eliquis, also known history of bradycardia, presenting to the ED with spontaneous bleeding from the right lower leg, which I suspect is likely a bleeding varicose vein.  Patient's bleeding has stopped in the emergency department and he was observed for period of nearly 4 hours.  I advised that he hold his Eliquis dose this morning, continue his other medicines, and then resume all of his medicines including Eliquis this evening.  Will put on a temporary pressure bandage but I advise also that he take this down after 8 to 10 hours at home, to prevent worsening his lower extremity edema.  He verbalized understanding and agreement the plan.  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  He applies moisturizing lotion to his legs and advised to continue that twice daily. [MT]    Clinical Course User Index [MT] Trifan, Carola Rhine, MD                             Medical Decision Making Risk Prescription drug management.   This patient presents to the ED for concern of right lower extremity bleeding from varicose vein, this involves an extensive number of treatment options, and is a complaint that carries with it a high risk of complications  and morbidity.  The differential diagnosis includes bleeding from varicose vein   Co morbidities that complicate the patient evaluation  Psoriasis, peripheral vascular disease, hypertension, COPD, permanent A-fib on Plavix and Eliquis   Additional history obtained:  Additional history obtained from wife at bedside who contributes to history as above   Consultations Obtained:  I requested consultation with the ER attending, Dr. Festus Barren,  and discussed lab and imaging findings as well as pertinent plan - they recommend: Have seen the patient and agree with  plan of care   Problem List / ED Course / Critical interventions / Medication management  82 year old male presents from home after varicose vein bleeding from right lower leg unable to control at home.  Patient applied a dressing and a tight bandage.  After prolonged wait in the lobby, this is removed with pinpoint area with very minimal bleeding at this time.  Patient's condition is complicated by his psoriasis and lower extremity edema.  Bleeding area treated with silver nitrate stick, no further bleeding.  Recommend patient continue to moisturize his legs, follow-up with his PCP dermatology, vascular Dr. For further recommendations and management.  Return to ER develop any bleeding. I ordered medication including silver nitrate to cauterize spot on right lower leg  Reevaluation of the patient after these medicines showed that the patient resolved I have reviewed the patients home medicines and have made adjustments as needed   Social Determinants of Health:  Lives with spouse.  Has PCP and specialty care for follow-up.   Test / Admission - Considered:  Bleeding controlled, discussed management at home and follow-up plan.         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

## 2022-04-07 NOTE — Discharge Instructions (Signed)
Follow up with your vascular doctor, call to schedule an appointment. If bleeding returns, can try dressing again, return to ER if unable to stop bleeding.

## 2022-04-07 NOTE — ED Provider Triage Note (Signed)
Emergency Medicine Provider Triage Evaluation Note  [name] , a 82 y.o. male  was evaluated in triage.  Pt complains of small foci of pressurized bleeding from area in his right lower extremity below the knee.  Patient is on Eliquis as well as Plavix.  States that several hours ago he noticed that his family was worse and discovered that this is bleeding.  He was unable to get it to stop bleeding initially with direct pressure and ended up wearing a pressure bandage and surrounding this.    Review of Systems  Positive: Right lower extremity bleeding Negative: Fever  Physical Exam  BP (!) 155/48   Pulse (!) 46   Temp 98 F (36.7 C)   Resp 18   SpO2 98%  Gen:   Awake, no distress   Resp:  Normal effort  MSK:   Moves extremities without difficulty  Other:  ?Severe psoriasis BLLE, no bleeding through bandage that is in place.  DP PT pulses are palpable although there is some edema present that has to be pushed through.  Sensation is intact able to wiggle toes.  Medical Decision Making  Medically screening exam initiated at 5:42 AM.  Appropriate orders placed.  [name] was informed that the remainder of the evaluation will be completed by another provider, this initial triage assessment does not replace that evaluation, and the importance of remaining in the ED until their evaluation is complete.   Will need to be in a room with overhead lighting and tools to address hemostasis.   [name], [place] 04/07/22 [phone]

## 2022-04-07 NOTE — ED Notes (Signed)
Dressing applied to wound, patient then wrapped in ace wrap to help improve swelling.

## 2022-04-07 NOTE — ED Triage Notes (Signed)
Patient arrived with EMS from home reports sudden right lower leg bleeding , denies injury , he takes Plavix and Eliquis , EMS placed pressure dressing prior to arrival .

## 2022-04-09 ENCOUNTER — Ambulatory Visit (HOSPITAL_COMMUNITY): Admission: RE | Admit: 2022-04-09 | Payer: Medicare Other | Source: Ambulatory Visit

## 2022-04-09 ENCOUNTER — Other Ambulatory Visit (HOSPITAL_COMMUNITY): Payer: Self-pay | Admitting: Internal Medicine

## 2022-04-09 ENCOUNTER — Encounter (HOSPITAL_COMMUNITY): Payer: Self-pay

## 2022-04-09 DIAGNOSIS — I6523 Occlusion and stenosis of bilateral carotid arteries: Secondary | ICD-10-CM

## 2022-04-10 ENCOUNTER — Encounter (HOSPITAL_COMMUNITY): Payer: Self-pay | Admitting: Internal Medicine

## 2022-04-10 ENCOUNTER — Ambulatory Visit (HOSPITAL_COMMUNITY)
Admission: RE | Admit: 2022-04-10 | Discharge: 2022-04-10 | Disposition: A | Discharge status: Home / Self Care | Payer: Medicare Other | Source: Ambulatory Visit | Attending: Internal Medicine | Admitting: Internal Medicine

## 2022-04-10 VITALS — BP 108/68 | HR 47 | Wt 212.8 lb

## 2022-04-10 DIAGNOSIS — I110 Hypertensive heart disease with heart failure: Secondary | ICD-10-CM | POA: Diagnosis not present

## 2022-04-10 DIAGNOSIS — I5032 Chronic diastolic (congestive) heart failure: Secondary | ICD-10-CM | POA: Insufficient documentation

## 2022-04-10 DIAGNOSIS — I272 Pulmonary hypertension, unspecified: Secondary | ICD-10-CM | POA: Diagnosis not present

## 2022-04-10 DIAGNOSIS — Z79899 Other long term (current) drug therapy: Secondary | ICD-10-CM | POA: Diagnosis not present

## 2022-04-10 DIAGNOSIS — I77810 Thoracic aortic ectasia: Secondary | ICD-10-CM | POA: Diagnosis not present

## 2022-04-10 DIAGNOSIS — J449 Chronic obstructive pulmonary disease, unspecified: Secondary | ICD-10-CM | POA: Diagnosis not present

## 2022-04-10 DIAGNOSIS — E669 Obesity, unspecified: Secondary | ICD-10-CM | POA: Diagnosis not present

## 2022-04-10 DIAGNOSIS — I2510 Atherosclerotic heart disease of native coronary artery without angina pectoris: Secondary | ICD-10-CM | POA: Diagnosis not present

## 2022-04-10 DIAGNOSIS — Z955 Presence of coronary angioplasty implant and graft: Secondary | ICD-10-CM | POA: Insufficient documentation

## 2022-04-10 DIAGNOSIS — Z7901 Long term (current) use of anticoagulants: Secondary | ICD-10-CM | POA: Insufficient documentation

## 2022-04-10 DIAGNOSIS — L409 Psoriasis, unspecified: Secondary | ICD-10-CM | POA: Diagnosis not present

## 2022-04-10 DIAGNOSIS — Z951 Presence of aortocoronary bypass graft: Secondary | ICD-10-CM | POA: Diagnosis not present

## 2022-04-10 DIAGNOSIS — Z87891 Personal history of nicotine dependence: Secondary | ICD-10-CM | POA: Insufficient documentation

## 2022-04-10 DIAGNOSIS — Z7982 Long term (current) use of aspirin: Secondary | ICD-10-CM | POA: Insufficient documentation

## 2022-04-10 DIAGNOSIS — R600 Localized edema: Secondary | ICD-10-CM | POA: Insufficient documentation

## 2022-04-10 DIAGNOSIS — I482 Chronic atrial fibrillation, unspecified: Secondary | ICD-10-CM | POA: Diagnosis not present

## 2022-04-10 DIAGNOSIS — G4733 Obstructive sleep apnea (adult) (pediatric): Secondary | ICD-10-CM | POA: Insufficient documentation

## 2022-04-10 NOTE — Patient Instructions (Addendum)
There has been no changes to your medications.  Your physician recommends that you schedule a follow-up appointment AS NEEDED.  If you have any questions or concerns before your next appointment please send [place] a message through [place] or call our office at [phone].    TO LEAVE A MESSAGE FOR THE NURSE SELECT OPTION 2, PLEASE LEAVE A MESSAGE INCLUDING: YOUR NAME DATE OF BIRTH CALL BACK NUMBER REASON FOR CALL**this is important as we prioritize the call backs  YOU WILL RECEIVE A CALL BACK THE SAME DAY AS LONG AS YOU CALL BEFORE 4:00 PM  At the [facility], you and your health needs are our priority. As part of our continuing mission to provide you with exceptional heart care, we have created designated Provider Care Teams. These Care Teams include your primary Cardiologist (physician) and Advanced Practice Providers (APPs- Physician Assistants and Nurse Practitioners) who all work together to provide you with the care you need, when you need it.   You may see any of the following providers on your designated Care Team at your next follow up: Dr Glori Bickers Dr Loralie Champagne Dr. Roxana Hires, NP Lyda Jester, [place] [facility] [place], [place] Forestine Na, NP Audry Riles, PharmD   Please be sure to bring in all your medications bottles to every appointment.

## 2022-04-10 NOTE — Progress Notes (Addendum)
ADVANCED HF CLINIC NOTE  Referring Physician:Jonathan Gwenlyn Found, MD  Primary Care: Lavone Orn, MD Primary Cardiologist: [name], MD   HPI:  [name] is a 82 y.o. [place] male with obesity, HTN, chronic AF, CAD s/p RCA stenting in 1999 and CABG 11/19, PAD, colonCA s/p right colectomy 3/08 followed by 11 cycles of adjuvant Folfox chemotherapy. Referred by Dr. 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  Quit smoking 25 years ago   Echo 10/22 LVEF >65% RV dilated with normal function. Severe central TR RVSP 70mHG (previously 38)   TEE at time of CABG 11/19 Normal RV. No TR  CTA chest no PE. + evidence of RV strain    VQ 4/23 No clots  PFTs 3/23: FEV1 1.54 (42%) FVC 3.23 (64) FEV1/FVC 48% DLCO 46%  RA = 12 (v- waves to 23) RV = 40/14 PA = 43/11 (26) PCW = 14 (v- waves up to 35) Fick cardiac output/index = 6.6/2.9 PVR = 1.8 WU Ao sat = 93% PA sat = 64%, 69% PAPi = 2.7    Assessment: 1. Mild pulmonary HTN with normal PVR and normal cardiac output 2. Prominent v-waves in both RA and PCWP tracings suggestive of significant TR/MR   Plan/Discussion:    Suspect PH related to valvular disease. Will need TEE to further evaluate. There is near equalization of pressures but suspicion for post-op constriction, not overly high. cMRI pending.  Sleep study 2/23: Severe sleep apnea: OSA 45 AHI CSA 15. Saw Dr. TRadford Pax-> now CPAP.   cMRI 11/23 1.  Severe RAE, moderate LAE 2.  Mild LVH normal LVEF 71% with no RWMAls 3. Normal post gadolinium images no evidence of amyloid or infiltrative disease 4.  Normal RV size and function RVEF 55% 5.  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 Normal appearing MV trivial MR. AV is trileaflet with no significant AR or restriction to motion. Normal PV/TV with normal RVOT  Here for f/u. Overall doing pretty well. But struggling with the psoriasis on his lower legs.Also notes increased  LE edema. Unable to wear compression socks due to psoriasis.  Recently seen in ER and told he had a ruptured varicose vein in his leg as well. From cardiac standpoint feels good. No SOB walking 5-7K steps per day.     Past Medical History:  Diagnosis Date   A-fib ([facility]    Allergy    Seasonal--pollen   Anticoagulation goal of INR 2 to 3    on coumadin   Arrhythmia    CAD (coronary artery disease) 1999   RCA stent   Carotid stenosis, asymptomatic    moderate RT and mild LT with carotid dopplers 10/26/11   [place]   Stage III--T3 N1   COPD (chronic obstructive pulmonary disease) ([facility]    H/O cardiovascular stress test 01/13/2010   low risk scan, similar to 2008 study   H/O echocardiogram 05/23/2009   EF >55%, mild MR, Mild TR,    Hand foot syndrome    Hemorrhoids    Hyperlipidemia    Hypertension    Neuropathy    Permanent atrial fibrillation (HCC)    Psoriasis    Tubular adenoma of colon     Current Outpatient Medications  Medication Sig Dispense Refill   Alpha-Lipoic Acid 600 MG TABS Take 600 mg by mouth 2 (two) times daily.     atorvastatin (LIPITOR) 80 MG tablet TAKE 1 TABLET AT 6PM. 90  tablet 3   cholecalciferol (VITAMIN D) 1000 UNITS tablet Take 1,000 Units by mouth daily.     clopidogrel (PLAVIX) 75 MG tablet Take 1 tablet (75 mg total) by mouth daily with breakfast. 90 tablet 3   Coenzyme Q10 200 MG capsule Take 200 mg by mouth daily.      Cyanocobalamin (VITAMIN B 12 PO) Take 200 mcg by mouth daily.     ELIQUIS 5 MG TABS tablet TAKE 1 TABLET BY MOUTH TWICE  DAILY 180 tablet 3   Flaxseed, Linseed, (FLAX SEED OIL PO) Take 200 mg by mouth daily.     furosemide (LASIX) 40 MG tablet Take 1 tablet (40 mg total) by mouth in the morning AND 1 tablet (40 mg total) every evening. 180 tablet 3   Magnesium Citrate 200 MG TABS Take 400 mg by mouth daily in the afternoon.     metolazone (ZAROXOLYN) 2.5 MG tablet Take 1 tablet (2.5 mg total) by mouth 2 (two) times a  week. Every Mon and Fri     Polyethyl Glycol-Propyl Glycol (SYSTANE) 0.4-0.3 % GEL ophthalmic gel Place 1 application. into both eyes every 8 (eight) hours as needed (dry eyes).     pyridoxine (B-6) 200 MG tablet Take 200 mg by mouth daily.     spironolactone (ALDACTONE) 25 MG tablet Take 0.5 tablets (12.5 mg total) by mouth daily. 15 tablet 6   vitamin C (ASCORBIC ACID) 500 MG tablet Take 500 mg by mouth 3 (three) times daily.      Current Facility-Administered Medications  Medication Dose Route Frequency Provider Last Rate Last Admin   sodium chloride flush (NS) 0.9 % injection 3 mL  3 mL Intravenous Q12H Lorretta Harp, MD        No Known Allergies    Social History   Socioeconomic History   Marital status: Married    Spouse name: [name]   Number of children: 2   Years of education: Not on file   Highest education level: Bachelor's degree (e.g., BA, AB, BS)  Occupational History   Occupation: retired    Fish farm manager: RETIRED  Tobacco Use   Smoking status: Former    Years: 30.00    Types: Cigarettes    Quit date: 11/25/1993    Years since quitting: 28.3   Smokeless tobacco: Never  Vaping Use   Vaping Use: Never used  Substance and Sexual Activity   Alcohol use: Yes    Alcohol/week: 1.0 standard drink of alcohol    Types: 1 Cans of beer per week   Drug use: No   Sexual activity: Not on file  Other Topics Concern   Not on file  Social History Narrative   Not on file   Social Determinants of Health   Financial Resource Strain: Low Risk  (04/08/2018)   Overall Financial Resource Strain (CARDIA)    Difficulty of Paying Living Expenses: Not hard at all  Food Insecurity: No Food Insecurity (12/22/2021)   Hunger Vital Sign    Worried About Running Out of Food in the Last Year: Never true    Ran Out of Food in the Last Year: Never true  Transportation Needs: No Transportation Needs (12/22/2021)   PRAPARE - Hydrologist (Medical): No    Lack  of Transportation (Non-Medical): No  Physical Activity: Unknown (04/08/2018)   Exercise Vital Sign    Days of Exercise per Week: 3 days    Minutes of Exercise per Session: Not asked  Stress: Stress Concern Present (04/08/2018)   [place]    Feeling of Stress : To some extent  Social Connections: Not on file  Intimate Partner Violence: Not At Risk (12/22/2021)   Humiliation, Afraid, Rape, and Kick questionnaire    Fear of Current or Ex-Partner: No    Emotionally Abused: No    Physically Abused: No    Sexually Abused: No      Family History  Problem Relation Age of Onset   Heart disease Father 93   Heart disease Paternal Grandmother    Heart disease Mother    Hypertension Mother    Healthy Sister    Heart disease Paternal Grandfather    Hypertension Paternal Grandfather    Colon cancer Neg Hx     Vitals:   04/10/22 1018  BP: 108/68  Pulse: (!) 47  SpO2: 98%  Weight: 96.5 kg (212 lb 12.8 oz)     PHYSICAL EXAM: General:  Well appearing. No resp difficulty HEENT: normal Neck: supple. no JVD. Carotids 2+ bilat; no bruits. No lymphadenopathy or thryomegaly appreciated. Cor: PMI nondisplaced. Irregular brady No rubs, gallops or murmurs. Lungs: clear Abdomen: soft, nontender, nondistended. No hepatosplenomegaly. No bruits or masses. Good bowel sounds. Extremities: no cyanosis, clubbing. severe varicose veins and erythema on lower legs with psoriatic patches 2 + edema Neuro: alert & orientedx3, cranial nerves grossly intact. moves all 4 extremities w/o difficulty. Affect pleasant   ECG: AF 45 RBBB, LAFB Personally reviewed   ASSESSMENT & PLAN:  1. Pulmonary HTN on echo  - Echo 10/22 LVEF >65% RV dilated with normal function. Severe central TR RVSP 17mHG (previously 38)  - TEE at time of CABG 11/19 Normal RV. No TR - CTA chest 1/23 no PE. + evidence of RV strain   - Etiology unclear - suspect mixed  picture - RHC 4/23: PA = 43/11 (26) PCW = 14 (v- waves up to 35)Fick = 6.6/2.9 PVR = 1.8 WU - Sleeps study with severe OSA/CSA - on CPAP - VQ scan negative - Serologies negative - PFTs 3/23: FEV1 1.54 (42%) FVC 3.23 (64) FEV1/FVC 48% DLCO 46% - RHC suggests possible MR with prominent v-waves but no significant MR on cMRI  - cMRI 12/23 LVEF 71% RVEF 59% biatrial enlargement No LGE - Overall much improved. NYHA II. Volume status much improved. Continue torsemide + metolazone on M/F spiro - Will not add SGLT2i at this time - Not candidate for pulmonary artery vasodilators with normal PVR - No further w/u at this point  2. CAD s/p CABG 2019  - no s/s angina - continue ASA/statin - managed by Dr. BGwenlyn Found 3. Chronic AF - stable. Rate slow but responds to activity  - followed by Dr. BGwenlyn Found - Continue Eliquis - no current role for PPM but I told him he will likely need one in the future  4. COPD - consider pulmonary f/u  5. LE edema/psoriasis - he has evidence of severe LE venous insufficiency with stasis dermatitis as well as psoriatic irritation. I cannot r/o component of cellulitis. He is in process of going to Dermatology and I gave him some additional names. He should f/u asap - needs compression as well (need to be careful with PAD) - may benefit from eventual referral to Vein clinic. I will defer this to Dr. BGwenlyn Found Can f/u with [place] PRN basis.   DGlori Bickers MD  10:59 PM

## 2022-04-17 ENCOUNTER — Encounter: Payer: Self-pay | Admitting: Cardiovascular Disease

## 2022-04-17 ENCOUNTER — Ambulatory Visit: Payer: Medicare Other | Attending: Cardiovascular Disease | Admitting: Cardiovascular Disease

## 2022-04-17 VITALS — BP 130/68 | HR 50 | Ht 75.0 in | Wt 214.8 lb

## 2022-04-17 DIAGNOSIS — I2510 Atherosclerotic heart disease of native coronary artery without angina pectoris: Secondary | ICD-10-CM

## 2022-04-17 DIAGNOSIS — Z9861 Coronary angioplasty status: Secondary | ICD-10-CM | POA: Diagnosis not present

## 2022-04-17 DIAGNOSIS — E785 Hyperlipidemia, unspecified: Secondary | ICD-10-CM | POA: Diagnosis not present

## 2022-04-17 DIAGNOSIS — I4821 Permanent atrial fibrillation: Secondary | ICD-10-CM | POA: Diagnosis not present

## 2022-04-17 DIAGNOSIS — I739 Peripheral vascular disease, unspecified: Secondary | ICD-10-CM

## 2022-04-17 DIAGNOSIS — R0683 Snoring: Secondary | ICD-10-CM | POA: Diagnosis not present

## 2022-04-17 DIAGNOSIS — I10 Essential (primary) hypertension: Secondary | ICD-10-CM

## 2022-04-17 DIAGNOSIS — G4733 Obstructive sleep apnea (adult) (pediatric): Secondary | ICD-10-CM | POA: Diagnosis not present

## 2022-04-17 NOTE — Assessment & Plan Note (Signed)
History of dyslipidemia on statin therapy with lipid profile performed 12/22/2021 revealing total cholesterol 99, LDL 49 and HDL 38.

## 2022-04-17 NOTE — Assessment & Plan Note (Signed)
History of peripheral arterial disease with claudication status post peripheral angiography by myself 12/21/2021 with orbital atherectomy, VBX stenting of his bilateral Iliac Arteries.  Did Have Moderate SFA Disease Bilaterally.  His Claudication Improved As Did His Doppler Studies.  Will Repeat Lower Extremity Arterial Doppler Studies.

## 2022-04-17 NOTE — Assessment & Plan Note (Signed)
History of CAD status post RCA stenting by myself 01/11/1998.  He had normal LAD and circumflex at that time and normal LV function.  He was catheterized by Dr. Saunders Revel in the setting of non-STEMI 01/21/2018 revealing left main disease.  When CABG by Dr. Cyndia Bent 01/28/2018 with a LIMA to his LAD, vein to an obtuse marginal branch, and to the PDA/PLA sequentially.  He also had left atrial clipping.  He did not have pulmonary hypertension at that time.

## 2022-04-17 NOTE — Assessment & Plan Note (Signed)
History of essential hypertension blood pressure measured today at 130/68.  He is on no antihypertensive medications currently.

## 2022-04-17 NOTE — Assessment & Plan Note (Signed)
History of chronic A-fib with a slow ventricular response on Eliquis oral anticoagulation.  He is asymptomatic from this.  He is not on a negative chronotropic drug.

## 2022-04-17 NOTE — Progress Notes (Signed)
04/17/2022 [name]   12-29-1940  [id number]  Primary Physician Lavone Orn, MD Primary Cardiologist: Lorretta Harp MD FACP, [place], [place], [place]  HPI:  [name] is a 82 y.o.  mildly overweight, married Caucasian male father of 2, grandfather of 3 grandchildren who I saw  in the office 01/10/2022.[name] He has a history of CAD status post RCA stenting by myself January 11, 1998. He had normal circumflex, LAD and ejection fraction. His other problems include hypertension and chronic atrial fibrillation on Coumadin anticoagulation, rate controlled, as well as erectile dysfunction. He is totally asymptomatic. He did have colon cancer picked up on screening colonoscopy and underwent right colectomy May 29, 2006 followed by 11 cycles of adjuvant Folfox chemotherapy. [name] He is very active exercises 6 days a week doing weight training 3 days a week, and lower extremity training 3 times a week.[name]   He was admitted to the hospital on 01/21/2018 with non-STEMI.  He underwent cardiac catheterization 2 days later by Dr. Saunders Revel revealing left main disease but disease in his LAD and RCA as well.  He underwent CABG by Dr. Cyndia Bent on 01/28/2018 with a LIMA to his LAD, vein to an obtuse marginal branch and to the PDA and PLA sequentially.  He also had left atrial clipping at that time.    He ended up completing cardiac rehab as an outpatient.   He saw [name], NP in the office 12/14/2020 who placed him on a low-dose diuretic and obtain a 2D echocardiogram which was performed 12/20/2020 revealing severe TR with pulmonary hypertension which is new for him.  He is fairly active but does complain of dyspnea on exertion which is a new symptom.     His symptoms of dyspnea significantly improved with the addition of a diuretic.  I did go chest CTA that showed no evidence of thromboembolic disease.  In addition, I did lower extremity arterial Doppler studies that show significant iliac disease bilaterally.   He does complain of lifestyle limiting claudication.  He had a right heart cath performed by Dr. Haroldine Laws 06/14/2021 that showed mild pulmonary hypertension although he had V waves in both the right atrium and the wedge positions suggesting MR and TR.  He had a sleep study that showed severe obstructive sleep apnea.  He does have an appointment with Dr. Haroldine Laws at the end of May to discuss the right heart findings.  He may need a cardiac MRI and a TEE to further evaluate.  A chest CTA was negative for pulmonary thromboembolic disease as well as a VQ scan.  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   He has lost significant mount of weight as a result of the needed metolazone which I placed him on.  His peripheral edema has completely resolved.  His dyspnea has resolved as well and he feels clinically improved.  He currently denies chest pain or shortness of breath.  His major complaint is lifestyle-limiting claudication with Doppler studies performed 04/02/2021 that revealed bilateral iliac disease .   I performed peripheral angiography on him 12/21/2021 revealing significant bilateral iliac disease which I stented with a 7 mm x 29 mm long VBX covered stents.  He did have tandem lesions in both SFAs.  He was discharged home the following day.  His claudication is markedly improved.  Postprocedural Doppler studies performed 01/05/2022  revealed the stents to be widely patent.  Since I saw him 4 months ago he continues to do well.  Does complain of some right lower extremity swelling.  Does have psoriasis.  His claudication improved as a result of bilateral iliac orbital atherectomy using VBX covered stenting.  He denies chest pain or shortness of breath.  He does have persistent A-fib with a slow ventricular sponsor of 50 but is asymptomatic from this currently.      Current Meds  Medication Sig   Alpha-Lipoic Acid 600 MG TABS Take 600 mg by mouth 2 (two) times daily.   aspirin 81 MG chewable tablet 1 tablet Orally Once a day   atorvastatin (LIPITOR) 80 MG tablet TAKE 1 TABLET AT 6PM.   cholecalciferol (VITAMIN D) 1000 UNITS tablet Take 1,000 Units by mouth daily.   clopidogrel (PLAVIX) 75 MG tablet Take 1 tablet (75 mg total) by mouth daily with breakfast.   Coenzyme Q10 200 MG capsule Take 200 mg by mouth daily.    Cyanocobalamin (VITAMIN B 12 PO) Take 200 mcg by mouth daily.   ELIQUIS 5 MG TABS tablet TAKE 1 TABLET BY MOUTH TWICE  DAILY   Flaxseed, Linseed, (FLAX SEED OIL PO) Take 200 mg by mouth daily.   furosemide (LASIX) 40 MG tablet Take 1 tablet (40 mg total) by mouth in the morning AND 1 tablet (40 mg total) every evening.   Magnesium Citrate 200 MG TABS Take 400 mg by mouth daily in the afternoon.   metolazone (ZAROXOLYN) 2.5 MG tablet Take 1 tablet (2.5 mg total) by mouth 2 (two) times a week. Every Mon and Fri   Polyethyl Glycol-Propyl Glycol (SYSTANE) 0.4-0.3 % GEL ophthalmic gel Place 1 application. into both eyes every 8 (eight) hours as needed (dry eyes).   pyridoxine (B-6) 200 MG tablet Take 200 mg by mouth daily.   spironolactone (ALDACTONE) 25 MG tablet Take 0.5 tablets (12.5 mg total) by mouth daily.   vitamin C (ASCORBIC ACID) 500 MG tablet Take 500 mg by mouth 3 (three) times daily.    Current Facility-Administered Medications for the 04/17/22 encounter (Office Visit) with Lorretta Harp, MD  Medication   sodium chloride flush (NS) 0.9 % injection 3 mL     No Known Allergies  Social History   Socioeconomic History   Marital status: Married    Spouse name: [name]   Number of children: 2   Years of education: Not on file   Highest education level: Bachelor's degree (e.g., BA, AB, BS)  Occupational History   Occupation: retired    Fish farm manager: RETIRED  Tobacco Use   Smoking status: Former    Years: 30.00     Types: Cigarettes    Quit date: 11/25/1993    Years since quitting: 28.4   Smokeless tobacco: Never  Vaping Use   Vaping Use: Never used  Substance and Sexual Activity   Alcohol use: Yes    Alcohol/week: 1.0 standard drink of alcohol    Types: 1 Cans of beer per week   Drug use: No   Sexual activity: Not on file  Other Topics Concern   Not on file  Social History Narrative   Not on file   Social Determinants of Health   Financial Resource Strain: Low Risk  (04/08/2018)   Overall Financial Resource Strain (CARDIA)    Difficulty of Paying Living Expenses: Not hard at all  Food Insecurity: No Food Insecurity (12/22/2021)   Hunger Vital Sign  Worried About Charity fundraiser in the Last Year: Never true    [place] in the Last Year: Never true  Transportation Needs: No Transportation Needs (12/22/2021)   PRAPARE - Hydrologist (Medical): No    Lack of Transportation (Non-Medical): No  Physical Activity: Unknown (04/08/2018)   Exercise Vital Sign    Days of Exercise per Week: 3 days    Minutes of Exercise per Session: Not asked  Stress: Stress Concern Present (04/08/2018)   [place]    Feeling of Stress : To some extent  Social Connections: Not on file  Intimate Partner Violence: Not At Risk (12/22/2021)   Humiliation, Afraid, Rape, and Kick questionnaire    Fear of Current or Ex-Partner: No    Emotionally Abused: No    Physically Abused: No    Sexually Abused: No     Review of Systems: General: negative for chills, fever, night sweats or weight changes.  Cardiovascular: negative for chest pain, dyspnea on exertion, edema, orthopnea, palpitations, paroxysmal nocturnal dyspnea or shortness of breath Dermatological: negative for rash Respiratory: negative for cough or wheezing Urologic: negative for hematuria Abdominal: negative for nausea, vomiting, diarrhea, bright red  blood per rectum, melena, or hematemesis Neurologic: negative for visual changes, syncope, or dizziness All other systems reviewed and are otherwise negative except as noted above.    Blood pressure 130/68, pulse (!) 50, height '6\' 3"'$  (1.905 m), weight 214 lb 12.8 oz (97.4 kg), SpO2 99 %.  General appearance: alert and no distress Neck: no adenopathy, no carotid bruit, no JVD, supple, symmetrical, trachea midline, and thyroid not enlarged, symmetric, no tenderness/mass/nodules Lungs: clear to auscultation bilaterally Heart: irregularly irregular rhythm Extremities: 1-2+ edema with venous stasis changes and psoriasis changes bilaterally Pulses: Diminished pedal pulses Skin: Changes of venous stasis with psoriatic changes. 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  I personally reviewed this EKG.  ASSESSMENT AND PLAN:   Essential hypertension History of essential hypertension blood pressure measured today at 130/68.  He is on no antihypertensive medications currently.  Atrial fibrillation (HCC) History of chronic A-fib with a slow ventricular response on Eliquis oral anticoagulation.  He is asymptomatic from this.  He is not on a negative chronotropic drug.  CAD S/P percutaneous coronary angioplasty History of CAD status post RCA stenting by myself 01/11/1998.  He had normal LAD and circumflex at that time and normal LV function.  He was catheterized by Dr. Saunders Revel in the setting of non-STEMI 01/21/2018 revealing left main disease.  When CABG by Dr. Cyndia Bent 01/28/2018 with a LIMA to his LAD, vein to an obtuse marginal branch, and to the PDA/PLA sequentially.  He also had left atrial clipping.  He did not have pulmonary hypertension at that time.  Dyslipidemia, goal LDL below 70 History of dyslipidemia on statin therapy with lipid profile performed 12/22/2021 revealing total  cholesterol 99, LDL 49 and HDL 38.  Pulmonary hypertension, unspecified ([place]) History of pulm hypertension with severe TR evaluated by Dr. Haroldine Laws.  He did have a right heart cath as well as cardiac MRI and TEE to further evaluate.  His VQ scan was negative.  He has been placed on diuretics with clinical improvement.  The etiology of his TR and pulm hypertension are still undetermined.  He was thought not to be a candidate for pulmonary  vasodilators.  Peripheral arterial disease ([place]) History of peripheral arterial disease with claudication status post peripheral angiography by myself 12/21/2021 with orbital atherectomy, VBX stenting of his bilateral Iliac Arteries.  Did Have Moderate SFA Disease Bilaterally.  His Claudication Improved As Did His Doppler Studies.  Will Repeat Lower Extremity Arterial Doppler Studies.     Lorretta Harp MD FACP,FACC,FAHA, [facility] 04/17/2022 11:17 AM

## 2022-04-17 NOTE — Patient Instructions (Addendum)
Medication Instructions:  Your physician recommends that you continue on your current medications as directed. Please refer to the Current Medication list given to you today.  *If you need a refill on your cardiac medications before your next appointment, please call your pharmacy*   Testing/Procedures: Your physician has requested that you have an Aorta/Iliac Duplex. This will be take place at [place], [address].  No food after 11PM the night before.  Water is OK. (Don't drink liquids if you have been instructed not to for ANOTHER test) Avoid foods that produce bowel gas, for 24 hours prior to exam (see below). No breakfast, no chewing gum, no smoking or carbonated beverages. Patient may take morning medications with water. Come in for test at least 15 minutes early to register. To be done in April  Your physician has requested that you have an ankle brachial index (ABI). During this test an ultrasound and blood pressure cuff are used to evaluate the arteries that supply the arms and legs with blood. Allow thirty minutes for this exam. There are no restrictions or special instructions. This will take place at [place], [address].  To be done in April.     Follow-Up: At [facility], you and your health needs are our priority.  As part of our continuing mission to provide you with exceptional heart care, we have created designated Provider Care Teams.  These Care Teams include your primary Cardiologist (physician) and Advanced Practice Providers (APPs -  Physician Assistants and Nurse Practitioners) who all work together to provide you with the care you need, when you need it.  We recommend signing up for the patient portal called "MyChart".  Sign up information is provided on this After Visit Summary.  MyChart is used to connect with patients for Virtual Visits (Telemedicine).  Patients are able to view lab/test results, encounter notes, upcoming appointments,  etc.  Non-urgent messages can be sent to your provider as well.   To learn more about what you can do with MyChart, go to [email or web address].    Your next appointment:   6 month(s)  Provider:   [name], MD

## 2022-04-17 NOTE — Assessment & Plan Note (Signed)
History of pulm hypertension with severe TR evaluated by Dr. Haroldine Laws.  He did have a right heart cath as well as cardiac MRI and TEE to further evaluate.  His VQ scan was negative.  He has been placed on diuretics with clinical improvement.  The etiology of his TR and pulm hypertension are still undetermined.  He was thought not to be a candidate for pulmonary vasodilators.

## 2022-05-03 ENCOUNTER — Ambulatory Visit: Payer: Medicare Other | Attending: Cardiovascular Disease | Admitting: Cardiovascular Disease

## 2022-05-03 ENCOUNTER — Encounter: Payer: Self-pay | Admitting: Cardiovascular Disease

## 2022-05-03 VITALS — BP 134/72 | HR 53 | Ht 75.0 in | Wt 214.6 lb

## 2022-05-03 DIAGNOSIS — I739 Peripheral vascular disease, unspecified: Secondary | ICD-10-CM

## 2022-05-03 NOTE — Patient Instructions (Signed)
Medication Instructions:  Your physician recommends that you continue on your current medications as directed. Please refer to the Current Medication list given to you today.  *If you need a refill on your cardiac medications before your next appointment, please call your pharmacy*   Follow-Up: At [facility], you and your health needs are our priority.  As part of our continuing mission to provide you with exceptional heart care, we have created designated Provider Care Teams.  These Care Teams include your primary Cardiologist (physician) and Advanced Practice Providers (APPs -  Physician Assistants and Nurse Practitioners) who all work together to provide you with the care you need, when you need it.  We recommend signing up for the patient portal called "MyChart".  Sign up information is provided on this After Visit Summary.  MyChart is used to connect with patients for Virtual Visits (Telemedicine).  Patients are able to view lab/test results, encounter notes, upcoming appointments, etc.  Non-urgent messages can be sent to your provider as well.   To learn more about what you can do with MyChart, go to [email or web address].    Your next appointment:   6 month(s)  Provider:   [name], MD

## 2022-05-03 NOTE — Progress Notes (Signed)
[name] returns today because of concern about some varicose veins in his right leg and swelling in his right knee.  He was worried that this is related to the atherosclerotic narrowing in his right SFA which I assured him was unrelated.  He denies claudication.  I will see him back in 6 months.  Lorretta Harp, M.D., [place], [facility], [place] [address]. [place], [place]  [address]  [phone] 05/03/2022 11:44 AM

## 2022-05-16 DIAGNOSIS — I872 Venous insufficiency (chronic) (peripheral): Secondary | ICD-10-CM | POA: Diagnosis not present

## 2022-05-17 DIAGNOSIS — I10 Essential (primary) hypertension: Secondary | ICD-10-CM | POA: Diagnosis not present

## 2022-05-17 DIAGNOSIS — G4733 Obstructive sleep apnea (adult) (pediatric): Secondary | ICD-10-CM | POA: Diagnosis not present

## 2022-05-17 DIAGNOSIS — R0683 Snoring: Secondary | ICD-10-CM | POA: Diagnosis not present

## 2022-05-28 DIAGNOSIS — H26493 Other secondary cataract, bilateral: Secondary | ICD-10-CM | POA: Diagnosis not present

## 2022-05-28 DIAGNOSIS — H3322 Serous retinal detachment, left eye: Secondary | ICD-10-CM | POA: Diagnosis not present

## 2022-05-28 DIAGNOSIS — H35373 Puckering of macula, bilateral: Secondary | ICD-10-CM | POA: Diagnosis not present

## 2022-06-09 ENCOUNTER — Other Ambulatory Visit (HOSPITAL_COMMUNITY): Payer: Self-pay | Admitting: Internal Medicine

## 2022-06-16 DIAGNOSIS — G4733 Obstructive sleep apnea (adult) (pediatric): Secondary | ICD-10-CM | POA: Diagnosis not present

## 2022-06-16 DIAGNOSIS — R0683 Snoring: Secondary | ICD-10-CM | POA: Diagnosis not present

## 2022-06-16 DIAGNOSIS — I10 Essential (primary) hypertension: Secondary | ICD-10-CM | POA: Diagnosis not present

## 2022-06-25 ENCOUNTER — Ambulatory Visit (HOSPITAL_COMMUNITY)
Admission: RE | Admit: 2022-06-25 | Discharge: 2022-06-25 | Disposition: A | Discharge status: Home / Self Care | Payer: Medicare Other | Source: Ambulatory Visit | Attending: Internal Medicine | Admitting: Internal Medicine

## 2022-06-25 ENCOUNTER — Ambulatory Visit (HOSPITAL_BASED_OUTPATIENT_CLINIC_OR_DEPARTMENT_OTHER)
Admission: RE | Admit: 2022-06-25 | Discharge: 2022-06-25 | Disposition: A | Discharge status: Home / Self Care | Payer: Medicare Other | Source: Ambulatory Visit | Attending: Cardiovascular Disease | Admitting: Cardiovascular Disease

## 2022-06-25 ENCOUNTER — Ambulatory Visit (HOSPITAL_BASED_OUTPATIENT_CLINIC_OR_DEPARTMENT_OTHER)
Admission: RE | Admit: 2022-06-25 | Discharge: 2022-06-25 | Disposition: A | Discharge status: Home / Self Care | Payer: Medicare Other | Source: Ambulatory Visit | Attending: Internal Medicine | Admitting: Internal Medicine

## 2022-06-25 DIAGNOSIS — I739 Peripheral vascular disease, unspecified: Secondary | ICD-10-CM | POA: Insufficient documentation

## 2022-06-25 DIAGNOSIS — I6523 Occlusion and stenosis of bilateral carotid arteries: Secondary | ICD-10-CM | POA: Diagnosis not present

## 2022-06-25 DIAGNOSIS — Z95820 Peripheral vascular angioplasty status with implants and grafts: Secondary | ICD-10-CM | POA: Insufficient documentation

## 2022-06-25 LAB — VAS US ABI WITH/WO TBI

## 2022-06-29 ENCOUNTER — Encounter (HOSPITAL_COMMUNITY): Payer: Self-pay

## 2022-06-29 ENCOUNTER — Other Ambulatory Visit (HOSPITAL_COMMUNITY): Payer: Self-pay | Admitting: Internal Medicine

## 2022-06-29 ENCOUNTER — Ambulatory Visit (HOSPITAL_COMMUNITY)
Admission: RE | Admit: 2022-06-29 | Discharge: 2022-06-29 | Disposition: A | Discharge status: Home / Self Care | Payer: Medicare Other | Source: Ambulatory Visit | Attending: Family Medicine | Admitting: Family Medicine

## 2022-06-29 ENCOUNTER — Inpatient Hospital Stay (HOSPITAL_COMMUNITY)
Admission: RE | Admit: 2022-06-29 | Discharge: 2022-06-29 | Disposition: A | Discharge status: Home / Self Care | Payer: Medicare Other | Source: Ambulatory Visit | Attending: Internal Medicine | Admitting: Internal Medicine

## 2022-06-29 VITALS — BP 126/64 | HR 51 | Wt 215.4 lb

## 2022-06-29 DIAGNOSIS — I4821 Permanent atrial fibrillation: Secondary | ICD-10-CM | POA: Diagnosis not present

## 2022-06-29 DIAGNOSIS — Z951 Presence of aortocoronary bypass graft: Secondary | ICD-10-CM | POA: Diagnosis not present

## 2022-06-29 DIAGNOSIS — Z87891 Personal history of nicotine dependence: Secondary | ICD-10-CM | POA: Insufficient documentation

## 2022-06-29 DIAGNOSIS — E669 Obesity, unspecified: Secondary | ICD-10-CM | POA: Diagnosis not present

## 2022-06-29 DIAGNOSIS — I482 Chronic atrial fibrillation, unspecified: Secondary | ICD-10-CM | POA: Diagnosis not present

## 2022-06-29 DIAGNOSIS — Z955 Presence of coronary angioplasty implant and graft: Secondary | ICD-10-CM | POA: Diagnosis not present

## 2022-06-29 DIAGNOSIS — I10 Essential (primary) hypertension: Secondary | ICD-10-CM | POA: Insufficient documentation

## 2022-06-29 DIAGNOSIS — I071 Rheumatic tricuspid insufficiency: Secondary | ICD-10-CM | POA: Diagnosis not present

## 2022-06-29 DIAGNOSIS — Z9221 Personal history of antineoplastic chemotherapy: Secondary | ICD-10-CM | POA: Insufficient documentation

## 2022-06-29 DIAGNOSIS — Z79899 Other long term (current) drug therapy: Secondary | ICD-10-CM | POA: Diagnosis not present

## 2022-06-29 DIAGNOSIS — Z7902 Long term (current) use of antithrombotics/antiplatelets: Secondary | ICD-10-CM | POA: Diagnosis not present

## 2022-06-29 DIAGNOSIS — Z85038 Personal history of other malignant neoplasm of large intestine: Secondary | ICD-10-CM | POA: Diagnosis not present

## 2022-06-29 DIAGNOSIS — J449 Chronic obstructive pulmonary disease, unspecified: Secondary | ICD-10-CM | POA: Insufficient documentation

## 2022-06-29 DIAGNOSIS — Z7901 Long term (current) use of anticoagulants: Secondary | ICD-10-CM | POA: Insufficient documentation

## 2022-06-29 DIAGNOSIS — Z9049 Acquired absence of other specified parts of digestive tract: Secondary | ICD-10-CM | POA: Insufficient documentation

## 2022-06-29 DIAGNOSIS — Z7982 Long term (current) use of aspirin: Secondary | ICD-10-CM | POA: Insufficient documentation

## 2022-06-29 DIAGNOSIS — Z95820 Peripheral vascular angioplasty status with implants and grafts: Secondary | ICD-10-CM | POA: Insufficient documentation

## 2022-06-29 DIAGNOSIS — I2510 Atherosclerotic heart disease of native coronary artery without angina pectoris: Secondary | ICD-10-CM | POA: Insufficient documentation

## 2022-06-29 DIAGNOSIS — Z6826 Body mass index (BMI) 26.0-26.9, adult: Secondary | ICD-10-CM | POA: Insufficient documentation

## 2022-06-29 DIAGNOSIS — G4733 Obstructive sleep apnea (adult) (pediatric): Secondary | ICD-10-CM

## 2022-06-29 DIAGNOSIS — R001 Bradycardia, unspecified: Secondary | ICD-10-CM

## 2022-06-29 DIAGNOSIS — I739 Peripheral vascular disease, unspecified: Secondary | ICD-10-CM | POA: Insufficient documentation

## 2022-06-29 DIAGNOSIS — I272 Pulmonary hypertension, unspecified: Secondary | ICD-10-CM | POA: Insufficient documentation

## 2022-06-29 DIAGNOSIS — I452 Bifascicular block: Secondary | ICD-10-CM | POA: Insufficient documentation

## 2022-06-29 LAB — CBC
HCT: 40.8 % (ref 39.0–52.0)
Hemoglobin: 13.9 g/dL (ref 13.0–17.0)
MCH: 31.3 pg (ref 26.0–34.0)
MCHC: 34.1 g/dL (ref 30.0–36.0)
MCV: 91.9 fL (ref 80.0–100.0)
Platelets: 151 10*3/uL (ref 150–400)
RBC: 4.44 MIL/uL (ref 4.22–5.81)
RDW: 14 % (ref 11.5–15.5)
WBC: 6.9 10*3/uL (ref 4.0–10.5)
nRBC: 0 % (ref 0.0–0.2)

## 2022-06-29 LAB — COMPREHENSIVE METABOLIC PANEL
ALT: 54 U/L — ABNORMAL HIGH (ref 0–44)
AST: 46 U/L — ABNORMAL HIGH (ref 15–41)
Albumin: 4.4 g/dL (ref 3.5–5.0)
Alkaline Phosphatase: 87 U/L (ref 38–126)
Anion gap: 10 (ref 5–15)
BUN: 23 mg/dL (ref 8–23)
CO2: 29 mmol/L (ref 22–32)
Calcium: 9.3 mg/dL (ref 8.9–10.3)
Chloride: 94 mmol/L — ABNORMAL LOW (ref 98–111)
Creatinine, Ser: 1.07 mg/dL (ref 0.61–1.24)
GFR, Estimated: >60 mL/min (ref >60)
Glucose, Bld: 97 mg/dL (ref 70–99)
Potassium: 4 mmol/L (ref 3.5–5.1)
Sodium: 133 mmol/L — ABNORMAL LOW (ref 135–145)
Total Bilirubin: 1.8 mg/dL — ABNORMAL HIGH (ref 0.3–1.2)
Total Protein: 7.1 g/dL (ref 6.5–8.1)

## 2022-06-29 LAB — BRAIN NATRIURETIC PEPTIDE: B Natriuretic Peptide: 89.1 pg/mL (ref 0.0–100.0)

## 2022-06-29 LAB — TSH: TSH: 2.736 u[IU]/mL (ref 0.350–4.500)

## 2022-06-29 NOTE — Progress Notes (Signed)
ADVANCED HF CLINIC NOTE   Primary Care: Kirby Funk, MD Primary Cardiologist: Nanetta Batty, MD HF Cardiologist: Dr. 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 Referred by Dr. 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  Quit smoking 25 years ago   Echo 10/22 LVEF >65% RV dilated with normal function. Severe central TR RVSP (previously 38)   TEE at time of CABG 11/19 Normal RV. No TR  CTA chest no PE. + evidence of RV strain    VQ 4/23 No clots  PFTs 3/23: FEV1 1.54 (42%) FVC 3.23 (64) FEV1/FVC 48% DLCO 46%  RA = 12 (v- waves to 23) RV = 40/14 PA = 43/11 (26) PCW = 14 (v- waves up to 35) Fick cardiac output/index = 6.6/2.9 PVR = 1.8 WU Ao sat = 93% PA sat = 64%, 69% PAPi = 2.7    Assessment: 1. Mild pulmonary HTN with normal PVR and normal cardiac output 2. Prominent v-waves in both RA and PCWP tracings suggestive of significant TR/MR   Plan/Discussion:    Suspect PH related to valvular disease. Will need TEE to further evaluate. There is near equalization of pressures but suspicion for post-op constriction, not overly high. cMRI pending.  Sleep study 2/23: Severe sleep apnea: OSA 45 AHI CSA 15. Saw Dr. Mayford Knife -> now CPAP.   cMRI 11/23 1.  Severe RAE, moderate LAE 2.  Mild LVH normal LVEF 71% with no RWMAls 3. Normal post gadolinium images no evidence of amyloid or infiltrative disease 4.  Normal RV size and function RVEF 55% 5.  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 Normal appearing MV trivial MR. AV is trileaflet with no significant AR or restriction to motion. Normal PV/TV with normal RVOT  Follow up 1/24, doing well NYHA II. No further work up for [employer] and advised he can follow up PRN.  Today he returns  for an acute visit with complaints of bradycardia. Overall feeling fine. He awoke last night and saw HR on FitBit was 34, asymptomatic. HRs average 39 and 40, only goes that low for a short time and increases quickly with exercise. He wears CPAP. Recently had vascular surgery so not able to exercise much as he wants, but not limited by dyspnea. No syncope or pre-syncope.  Denies palpitations, abnormal bleeding, CP, edema, or PND/Orthopnea. Appetite ok. No fever or chills. Weight at home 215 pounds. Taking all medications.   Past Medical History:  Diagnosis Date   A-fib    Allergy    Seasonal--pollen   Anticoagulation goal of INR 2 to 3    on coumadin   Arrhythmia    CAD (coronary artery disease) 1999   RCA stent   Carotid stenosis, asymptomatic    moderate RT and mild LT with carotid dopplers 10/26/11   Colon cancer 04/2006   Stage III--T3 N1   COPD (chronic obstructive pulmonary disease)    H/O cardiovascular stress test 01/13/2010   low risk scan, similar to 2008 study   H/O echocardiogram 05/23/2009   EF >55%, mild MR, Mild TR,    Hand foot syndrome    Hemorrhoids    Hyperlipidemia    Hypertension    Neuropathy    Permanent atrial fibrillation    Psoriasis  Tubular adenoma of colon     Current Outpatient Medications  Medication Sig Dispense Refill   Alpha-Lipoic Acid 600 MG TABS Take 600 mg by mouth 2 (two) times daily.     aspirin 81 MG chewable tablet Chew 81 mg by mouth daily.     atorvastatin (LIPITOR) 80 MG tablet TAKE 1 TABLET AT 6PM. 90 tablet 3   cholecalciferol (VITAMIN D) 1000 UNITS tablet Take 1,000 Units by mouth daily.     clopidogrel (PLAVIX) 75 MG tablet Take 1 tablet (75 mg total) by mouth daily with breakfast. 90 tablet 3   Coenzyme Q10 200 MG capsule Take 200 mg by mouth daily.      Cyanocobalamin (VITAMIN B 12 PO) Take 200 mcg by mouth daily.     ELIQUIS 5 MG TABS tablet TAKE 1 TABLET BY MOUTH TWICE  DAILY 180 tablet 3   Flaxseed, Linseed, 1000 MG CAPS 1400   mg Orally once a day     furosemide (LASIX) 40 MG tablet Take 1 tablet (40 mg total) by mouth in the morning AND 1 tablet (40 mg total) every evening. 180 tablet 3   Magnesium Citrate 200 MG TABS Take 400 mg by mouth daily in the afternoon.     metolazone (ZAROXOLYN) 2.5 MG tablet Take 1 tablet (2.5 mg total) by mouth 2 (two) times a week. Every Mon and Fri     Polyethyl Glycol-Propyl Glycol (SYSTANE) 0.4-0.3 % GEL ophthalmic gel Place 1 application. into both eyes every 8 (eight) hours as needed (dry eyes).     pyridoxine (B-6) 200 MG tablet Take 200 mg by mouth daily.     spironolactone (ALDACTONE) 25 MG tablet Take 1/2 TABLET (12.5 mg total) by mouth daily. 15 tablet 6   vitamin C (ASCORBIC ACID) 500 MG tablet Take 500 mg by mouth 3 (three) times daily.      Current Facility-Administered Medications  Medication Dose Route Frequency Provider Last Rate Last Admin   sodium chloride flush (NS) 0.9 % injection 3 mL  3 mL Intravenous Q12H [name], MD        No Known Allergies    Social History   Socioeconomic History   Marital status: Married    Spouse name: Lattie Corns   Number of children: 2   Years of education: Not on file   Highest education level: Bachelor's degree (e.g., BA, AB, BS)  Occupational History   Occupation: retired    Associate Professor: RETIRED  Tobacco Use   Smoking status: Former    Years: 30    Types: Cigarettes    Quit date: 11/25/1993    Years since quitting: 28.6   Smokeless tobacco: Never  Vaping Use   Vaping Use: Never used  Substance and Sexual Activity   Alcohol use: Yes    Alcohol/week: 1.0 standard drink of alcohol    Types: 1 Cans of beer per week   Drug use: No   Sexual activity: Not on file  Other Topics Concern   Not on file  Social History Narrative   Not on file   Social Determinants of Health   Financial Resource Strain: Low Risk  (04/08/2018)   Overall Financial Resource Strain (CARDIA)    Difficulty of Paying Living Expenses: Not  hard at all  Food Insecurity: No Food Insecurity (12/22/2021)   Hunger Vital Sign    Worried About Running Out of Food in the Last Year: Never true    Ran Out of Food in the Last  Year: Never true  Transportation Needs: No Transportation Needs (12/22/2021)   PRAPARE - Administrator, Civil Service (Medical): No    Lack of Transportation (Non-Medical): No  Physical Activity: Unknown (04/08/2018)   Exercise Vital Sign    Days of Exercise per Week: 3 days    Minutes of Exercise per Session: Not asked  Stress: Stress Concern Present (04/08/2018)   [employer] of Occupational Health - Occupational Stress Questionnaire    Feeling of Stress : To some extent  Social Connections: Not on file  Intimate Partner Violence: Not At Risk (12/22/2021)   Humiliation, Afraid, Rape, and Kick questionnaire    Fear of Current or Ex-Partner: No    Emotionally Abused: No    Physically Abused: No    Sexually Abused: No   Family History  Problem Relation Age of Onset   Heart disease Father 35   Heart disease Paternal Grandmother    Heart disease Mother    Hypertension Mother    Healthy Sister    Heart disease Paternal Grandfather    Hypertension Paternal Grandfather    Colon cancer Neg Hx    BP 126/64   Pulse (!) 51   Wt 97.7 kg (215 lb 6.4 oz)   SpO2 95%   BMI 26.92 kg/m   Wt Readings from Last 3 Encounters:  06/29/22 97.7 kg (215 lb 6.4 oz)  05/03/22 97.3 kg (214 lb 9.6 oz)  04/17/22 97.4 kg (214 lb 12.8 oz)   PHYSICAL EXAM: General:  NAD. No resp difficulty, walked into clinic HEENT: Normal Neck: Supple. No JVD. Carotids 2+ bilat; no bruits. No lymphadenopathy or thryomegaly appreciated. Cor: PMI nondisplaced. Irregular rate & rhythm. No rubs, gallops or murmurs. Lungs: Clear Abdomen: Soft, nontender, nondistended. No hepatosplenomegaly. No bruits or masses. Good bowel sounds. Extremities: No cyanosis, clubbing, rash, edema Neuro: Alert & oriented x 3, cranial nerves  grossly intact. Moves all 4 extremities w/o difficulty. Affect pleasant.  ECG (personally reviewed): AF 52, RBBB, LAFB  ASSESSMENT & PLAN: Chronic Atrial fibrillation with slow VR - Rate is slow, but responds to activity. - Wears FitBit and HR has gone as low as 34, asymptomatic. - He is not on any nodal blocking agents. - Continue BiPap. - Check labs today - Place 2 week Zio to quantify - Refer to EP, ? nearing need for PPM.  2. Pulmonary HTN on echo  - Echo 10/22 LVEF >65% RV dilated with normal function. Severe central TR RVSP (previously 38)  - TEE at time of CABG 11/19 Normal RV. No TR - CTA chest 1/23 no PE. + evidence of RV strain   - Etiology unclear - suspect mixed picture - RHC 4/23: PA = 43/11 (26) PCW = 14 (v- waves up to 35)Fick = 6.6/2.9 PVR = 1.8 WU - Sleeps study with severe OSA/CSA - on CPAP - VQ scan negative - Serologies negative - PFTs 3/23: FEV1 1.54 (42%) FVC 3.23 (64) FEV1/FVC 48% DLCO 46% - RHC suggests possible MR with prominent v-waves but no significant MR on cMRI  - cMRI 12/23 LVEF 71% RVEF 59% biatrial enlargement No LGE - Overall much improved. NYHA I-II. Volume status stable.  - Continue Lasix 40 mg bid + metolazone 2.5 on M/F  - Continue spiro 12.5 mg daily. - Will not add SGLT2i at this time - Not candidate for pulmonary artery vasodilators with normal PVR - No further w/u at this point  3. CAD  - s/p CABG 2019  -  No s/s angina - Continue ASA/statin - Managed by Dr. Allyson Sabal  4. COPD - Consider pulmonary f/u  5. PAD - s/p bilateral iliac stenting 10/23 - Claudication improving, has mild symptoms  Can f/u with [place] on PRN basis.   Jacklynn Ganong, FNP  3:49 PM

## 2022-06-29 NOTE — Patient Instructions (Addendum)
Labs done today. We will contact you only if your labs are abnormal.  No medication changes were made. Please continue all current medications as prescribed.  You have been referred to Electrophysiology. They will contact you to schedule an appointment.   Your physician recommends that you schedule a follow-up appointment as needed  Your provider has recommended that  you wear a Zio Patch for 14 days.  This monitor will record your heart rhythm for our review.  IF you have any symptoms while wearing the monitor please press the button.  If you have any issues with the patch or you notice a red or orange light on it please call the company at [phone].  Once you remove the patch please mail it back to the company as soon as possible so we can get the results.  If you have any questions or concerns before your next appointment please send [place] a message through [place] or call our office at [phone].    TO LEAVE A MESSAGE FOR THE NURSE SELECT OPTION 2, PLEASE LEAVE A MESSAGE INCLUDING: YOUR NAME DATE OF BIRTH CALL BACK NUMBER REASON FOR CALL**this is important as we prioritize the call backs  YOU WILL RECEIVE A CALL BACK THE SAME DAY AS LONG AS YOU CALL BEFORE 4:00 PM   Do the following things EVERYDAY: Weigh yourself in the morning before breakfast. Write it down and keep it in a log. Take your medicines as prescribed Eat low salt foods--Limit salt (sodium) to 2000 mg per day.  Stay as active as you can everyday Limit all fluids for the day to less than 2 liters   At the [facility], you and your health needs are our priority. As part of our continuing mission to provide you with exceptional heart care, we have created designated Provider Care Teams. These Care Teams include your primary Cardiologist (physician) and Advanced Practice Providers (APPs- Physician Assistants and Nurse Practitioners) who all work together to provide you with the care you need, when  you need it.   You may see any of the following providers on your designated Care Team at your next follow up: Dr Arvilla Meres Dr Marca Ancona Dr. Marcos Eke, NP Robbie Lis, [place] [facility] [place], [place] Brynda Peon, NP Karle Plumber, PharmD   Please be sure to bring in all your medications bottles to every appointment.    Thank you for choosing [facility]

## 2022-06-29 NOTE — Progress Notes (Signed)
Zio patch placed onto patient.  All instructions and information reviewed with patient, they verbalize understanding with no questions. 

## 2022-07-05 DIAGNOSIS — I10 Essential (primary) hypertension: Secondary | ICD-10-CM | POA: Diagnosis not present

## 2022-07-05 DIAGNOSIS — G4733 Obstructive sleep apnea (adult) (pediatric): Secondary | ICD-10-CM | POA: Diagnosis not present

## 2022-07-14 ENCOUNTER — Other Ambulatory Visit: Payer: Self-pay | Admitting: Cardiovascular Disease

## 2022-07-17 DIAGNOSIS — I872 Venous insufficiency (chronic) (peripheral): Secondary | ICD-10-CM | POA: Diagnosis not present

## 2022-07-23 ENCOUNTER — Telehealth (HOSPITAL_COMMUNITY): Payer: Self-pay

## 2022-07-23 DIAGNOSIS — R001 Bradycardia, unspecified: Secondary | ICD-10-CM | POA: Diagnosis not present

## 2022-07-23 NOTE — Telephone Encounter (Signed)
Zio called with abnormal readings for this patient. The full report is in the system for your review as well. 100% A fib, VT x 2 episodes noted as well.

## 2022-07-23 NOTE — Addendum Note (Signed)
Encounter addended by: Crissie Figures, RN on: 07/23/2022 10:18 AM  Actions taken: Imaging Exam ended

## 2022-07-31 ENCOUNTER — Ambulatory Visit: Payer: Medicare Other | Attending: Cardiovascular Disease | Admitting: Cardiovascular Disease

## 2022-07-31 ENCOUNTER — Encounter: Payer: Self-pay | Admitting: Cardiovascular Disease

## 2022-07-31 VITALS — BP 118/62 | HR 55 | Ht 75.0 in | Wt 210.4 lb

## 2022-07-31 DIAGNOSIS — I498 Other specified cardiac arrhythmias: Secondary | ICD-10-CM | POA: Diagnosis not present

## 2022-07-31 DIAGNOSIS — I4821 Permanent atrial fibrillation: Secondary | ICD-10-CM

## 2022-07-31 NOTE — Patient Instructions (Signed)
Medication Instructions:  Your physician recommends that you continue on your current medications as directed. Please refer to the Current Medication list given to you today. *If you need a refill on your cardiac medications before your next appointment, please call your pharmacy*   Follow-Up: At [facility], you and your health needs are our priority.  As part of our continuing mission to provide you with exceptional heart care, we have created designated Provider Care Teams.  These Care Teams include your primary Cardiologist (physician) and Advanced Practice Providers (APPs -  Physician Assistants and Nurse Practitioners) who all work together to provide you with the care you need, when you need it.  We recommend signing up for the patient portal called "MyChart".  Sign up information is provided on this After Visit Summary.  MyChart is used to connect with patients for Virtual Visits (Telemedicine).  Patients are able to view lab/test results, encounter notes, upcoming appointments, etc.  Non-urgent messages can be sent to your provider as well.   To learn more about what you can do with MyChart, go to [email or web address].    Your next appointment:   As needed  Provider:   Augustus Mealor, MD  

## 2022-07-31 NOTE — Progress Notes (Signed)
Electrophysiology Office Note:    Date:  07/31/2022   ID:  [name], DOB Jun 02, 1940, MRN [id number]  PCP:  Kirby Funk, MD   [facility] Providers Cardiologist:  Nanetta Batty, MD     Referring MD: Jacklynn Ganong, FNP   History of Present Illness:    [name] is a 82 y.o. male with a hx listed below, significant for CAD status post RCA stent in 1999, CABG 2019; colon cancer status post right colectomy, obesity, hypertension, atrial fibrillation referred for arrhythmia management.  He has noticed slow heart rates on his Fitbit.  These occur either while he is napping or when he is reading and very relaxed.  When he stands up and is active, his heart rate increases.  He has not had any noticeable fatigue, lightheadedness, dizziness, presyncope.   Past Medical History:  Diagnosis Date   A-fib (HCC)    Allergy    Seasonal--pollen   Anticoagulation goal of INR 2 to 3    on coumadin   Arrhythmia    CAD (coronary artery disease) 1999   RCA stent   Carotid stenosis, asymptomatic    moderate RT and mild LT with carotid dopplers 10/26/11   Colon cancer (HCC) 04/2006   Stage III--T3 N1   COPD (chronic obstructive pulmonary disease) (HCC)    H/O cardiovascular stress test 01/13/2010   low risk scan, similar to 2008 study   H/O echocardiogram 05/23/2009   EF >55%, mild MR, Mild TR,    Hand foot syndrome    Hemorrhoids    Hyperlipidemia    Hypertension    Neuropathy    Permanent atrial fibrillation (HCC)    Psoriasis    Tubular adenoma of colon     Past Surgical History:  Procedure Laterality Date   ABDOMINAL AORTOGRAM W/LOWER EXTREMITY N/A 12/21/2021   Procedure: ABDOMINAL AORTOGRAM W/LOWER EXTREMITY;  Surgeon: [name], MD;  Location: [facility] INVASIVE CV [facility];  Service: Cardiovascular;  Laterality: N/A;   CARDIAC CATHETERIZATION     CLIPPING OF ATRIAL APPENDAGE  01/28/2018   Procedure: CLIPPING OF ATRIAL APPENDAGE;  Surgeon: Alleen Borne,  MD;  Location: [facility] OR;  Service: Open Heart Surgery;;   COLONOSCOPY     CORONARY ARTERY BYPASS GRAFT N/A 01/28/2018   Procedure: CORONARY ARTERY BYPASS GRAFTING (CABG) X 4 USING LEFT INTERNAL MAMMARY ARTERY AND LEFT GREATER SAPHENOUS VEIN HARVESTED ENDOSCOPICALLY;  Surgeon: Alleen Borne, MD;  Location: [facility] OR;  Service: Open Heart Surgery;  Laterality: N/A;   CORONARY STENT PLACEMENT  1999   RCA tandem NIR stents   LEFT HEART CATH AND CORONARY ANGIOGRAPHY N/A 01/23/2018   Procedure: LEFT HEART CATH AND CORONARY ANGIOGRAPHY;  Surgeon: Yvonne Kendall, MD;  Location: [facility] CV [facility];  Service: Cardiovascular;  Laterality: N/A;   PERIPHERAL VASCULAR ATHERECTOMY Bilateral 12/21/2021   Procedure: PERIPHERAL VASCULAR ATHERECTOMY;  Surgeon: [name], MD;  Location: [facility];  Service: Cardiovascular;  Laterality: Bilateral;  common illiac   PERIPHERAL VASCULAR INTERVENTION Bilateral 12/21/2021   Procedure: PERIPHERAL VASCULAR INTERVENTION;  Surgeon: [name], MD;  Location: [facility];  Service: Cardiovascular;  Laterality: Bilateral;  Common Illiacs   RIGHT COLECTOMY  05/29/2006   Laparoscopic assisted   RIGHT HEART CATH N/A 06/14/2021   Procedure: RIGHT HEART CATH;  Surgeon: Dolores Patty, MD;  Location: [facility];  Service: Cardiovascular;  Laterality: N/A;   TEE WITHOUT CARDIOVERSION N/A 01/28/2018   Procedure:  TRANSESOPHAGEAL ECHOCARDIOGRAM (TEE);  Surgeon: Alleen Borne, MD;  Location: [facility];  Service: Open Heart Surgery;  Laterality: N/A;   TONSILLECTOMY      Current Medications: Current Meds  Medication Sig   Alpha-Lipoic Acid 600 MG TABS Take 600 mg by mouth 2 (two) times daily.   aspirin 81 MG chewable tablet Chew 81 mg by mouth daily.   atorvastatin (LIPITOR) 80 MG tablet TAKE 1 TABLET AT 6PM.   cholecalciferol (VITAMIN D) 1000 UNITS tablet Take 1,000 Units by mouth daily.   clopidogrel (PLAVIX) 75 MG tablet Take 1 tablet (75 mg  total) by mouth daily with breakfast.   Coenzyme Q10 200 MG capsule Take 200 mg by mouth daily.    Cyanocobalamin (VITAMIN B 12 PO) Take 200 mcg by mouth daily.   ELIQUIS 5 MG TABS tablet TAKE 1 TABLET BY MOUTH TWICE  DAILY   Flaxseed, Linseed, 1000 MG CAPS 1400  mg Orally once a day   furosemide (LASIX) 40 MG tablet Take 1 tablet (40 mg total) by mouth in the morning AND 1 tablet (40 mg total) every evening.   Magnesium Citrate 200 MG TABS Take 400 mg by mouth daily in the afternoon.   metolazone (ZAROXOLYN) 2.5 MG tablet Take 1 tablet (2.5 mg total) by mouth 2 (two) times a week. Every Mon and Fri   Polyethyl Glycol-Propyl Glycol (SYSTANE) 0.4-0.3 % GEL ophthalmic gel Place 1 application. into both eyes every 8 (eight) hours as needed (dry eyes).   pyridoxine (B-6) 200 MG tablet Take 200 mg by mouth daily.   spironolactone (ALDACTONE) 25 MG tablet Take 1/2 TABLET (12.5 mg total) by mouth daily.   vitamin C (ASCORBIC ACID) 500 MG tablet Take 500 mg by mouth 3 (three) times daily.    Current Facility-Administered Medications for the 07/31/22 encounter (Office Visit) with Mance Vallejo, Roberts Gaudy, MD  Medication   sodium chloride flush (NS) 0.9 % injection 3 mL     Allergies:   Patient has no known allergies.   Social and Family History: Reviewed in Epic  ROS:   Please see the history of present illness.    All other systems reviewed and are negative.  EKGs/Labs/Other Studies Reviewed Today:    Echocardiogram:   Monitors:  ZIO 06/2022 - My interpretation Atrial fibrillation heart rate 31-179, avg 51 1.7% PVCs; some occurring in brief runs  Stress testing:    Advanced imaging:  [facility] 02/06/2022 Normal LV and RV function. Severe RAE, moderate left atrial enlargement.  No infiltrative disease  Cardiac catherization    EKG:  Last EKG results: today - atrial fibrillation, RBBB, LAFB   Recent Labs: 06/29/2022: ALT 54; B Natriuretic Peptide 89.1; BUN 23; Creatinine, Ser 1.07;  Hemoglobin 13.9; Platelets 151; Potassium 4.0; Sodium 133; TSH 2.736     Physical Exam:    VS:  BP 118/62   Pulse (!) 55   Ht 6\' 3"  (1.905 m)   Wt 210 lb 6.4 oz (95.4 kg)   SpO2 97%   BMI 26.30 kg/m     Wt Readings from Last 3 Encounters:  07/31/22 210 lb 6.4 oz (95.4 kg)  06/29/22 215 lb 6.4 oz (97.7 kg)  05/03/22 214 lb 9.6 oz (97.3 kg)     GEN: Well nourished, well developed in no acute distress CARDIAC: iRRR, no murmurs, rubs, gallops RESPIRATORY:  Normal work of breathing MUSCULOSKELETAL:  edema    ASSESSMENT & PLAN:    Permanent AF with slow ventricular rates AF was  diagnosed about 15 years ago, atrial activity is fine on ECG, severe RA enlargement -- not a candidate for ablation. Low rates were detected incidentally with smart watch. 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 He is to call if he has any of these symptoms He will continue to follow in general cardiology clinic, and I will see him as needed  Sleep apnea Continue BiPap            Medication Adjustments/Labs and Tests Ordered: Current medicines are reviewed at length with the patient today.  Concerns regarding medicines are outlined above.  No orders of the defined types were placed in this encounter.  No orders of the defined types were placed in this encounter.    Signed, Maurice Small, MD  07/31/2022 12:31 PM     [facility]

## 2022-08-07 ENCOUNTER — Telehealth (HOSPITAL_COMMUNITY): Payer: Self-pay

## 2022-08-07 NOTE — Telephone Encounter (Signed)
Patient called with BP information for you to review.  130-146/50-65 range.  He states he has been feeling a little lightheaded, but no other symptoms.  Just now it was 146/65.  He is concerned and wants to make sure he does not need to change any medications.

## 2022-08-07 NOTE — Telephone Encounter (Signed)
LMOM

## 2022-08-08 ENCOUNTER — Encounter (HOSPITAL_COMMUNITY): Payer: Self-pay

## 2022-08-08 ENCOUNTER — Telehealth: Payer: Self-pay | Admitting: Cardiovascular Disease

## 2022-08-08 DIAGNOSIS — Z79899 Other long term (current) drug therapy: Secondary | ICD-10-CM

## 2022-08-08 NOTE — Telephone Encounter (Signed)
Patient states he will start log today. Referral placed for hypertensive clinic. He is aware of ED precautions and to contact PCP.

## 2022-08-08 NOTE — Telephone Encounter (Signed)
I spoke to patient to update him and he stated his BP has been jumping around a lot - it was 164/66 and he says he is not "feeling right". He is having muscle cramping now along with lightheadedness.  He said it is usually pretty normal most of the time and it seems to be jumping around more.

## 2022-08-08 NOTE — Telephone Encounter (Signed)
Patient states yesterday his blood pressure has been running high. He states he can't explain it but he just feel bad. He denies chest pain, shortness of breath, headache, dizziness, nausea or vomiting. Asked have he contacted his PCP he states he does not have one. He will continue to monitor blood pressure. Will forward to provider for more advise  20-121/68 169/64 21-138/54  166/68 22 -166/67 158/66

## 2022-08-08 NOTE — Telephone Encounter (Signed)
Pt c/o BP issue: STAT if pt c/o blurred vision, one-sided weakness or slurred speech  1. What are your last 5 BP readings?   Today 166/67 158/66 138/50 (yesterday)  2. Are you having any other symptoms (ex. Dizziness, headache, blurred vision, passed out)?   Unsteady gait  3. What is your BP issue?    Patient stated he is concerned about his fluctuating blood pressure reading and general feeling of malaise.

## 2022-08-25 ENCOUNTER — Emergency Department (HOSPITAL_COMMUNITY): Payer: Medicare Other

## 2022-08-25 ENCOUNTER — Encounter (HOSPITAL_COMMUNITY): Payer: Self-pay

## 2022-08-25 ENCOUNTER — Inpatient Hospital Stay (HOSPITAL_COMMUNITY)
Admission: EM | Admit: 2022-08-25 | Discharge: 2022-08-28 | DRG: 312 | Disposition: A | Discharge status: Home / Self Care | Payer: Medicare Other | Attending: Internal Medicine | Admitting: Internal Medicine

## 2022-08-25 ENCOUNTER — Other Ambulatory Visit: Payer: Self-pay

## 2022-08-25 DIAGNOSIS — I499 Cardiac arrhythmia, unspecified: Secondary | ICD-10-CM | POA: Diagnosis not present

## 2022-08-25 DIAGNOSIS — R001 Bradycardia, unspecified: Secondary | ICD-10-CM | POA: Diagnosis not present

## 2022-08-25 DIAGNOSIS — E861 Hypovolemia: Secondary | ICD-10-CM | POA: Diagnosis not present

## 2022-08-25 DIAGNOSIS — I4510 Unspecified right bundle-branch block: Secondary | ICD-10-CM | POA: Diagnosis present

## 2022-08-25 DIAGNOSIS — E871 Hypo-osmolality and hyponatremia: Secondary | ICD-10-CM | POA: Diagnosis not present

## 2022-08-25 DIAGNOSIS — G629 Polyneuropathy, unspecified: Secondary | ICD-10-CM | POA: Diagnosis present

## 2022-08-25 DIAGNOSIS — E785 Hyperlipidemia, unspecified: Secondary | ICD-10-CM | POA: Diagnosis present

## 2022-08-25 DIAGNOSIS — Z9861 Coronary angioplasty status: Secondary | ICD-10-CM | POA: Diagnosis not present

## 2022-08-25 DIAGNOSIS — R0602 Shortness of breath: Secondary | ICD-10-CM | POA: Diagnosis not present

## 2022-08-25 DIAGNOSIS — I4891 Unspecified atrial fibrillation: Secondary | ICD-10-CM | POA: Diagnosis not present

## 2022-08-25 DIAGNOSIS — Z8249 Family history of ischemic heart disease and other diseases of the circulatory system: Secondary | ICD-10-CM

## 2022-08-25 DIAGNOSIS — Z7901 Long term (current) use of anticoagulants: Secondary | ICD-10-CM

## 2022-08-25 DIAGNOSIS — Z9049 Acquired absence of other specified parts of digestive tract: Secondary | ICD-10-CM | POA: Diagnosis not present

## 2022-08-25 DIAGNOSIS — E669 Obesity, unspecified: Secondary | ICD-10-CM | POA: Diagnosis present

## 2022-08-25 DIAGNOSIS — R296 Repeated falls: Secondary | ICD-10-CM | POA: Diagnosis not present

## 2022-08-25 DIAGNOSIS — Z951 Presence of aortocoronary bypass graft: Secondary | ICD-10-CM

## 2022-08-25 DIAGNOSIS — I2510 Atherosclerotic heart disease of native coronary artery without angina pectoris: Secondary | ICD-10-CM | POA: Diagnosis not present

## 2022-08-25 DIAGNOSIS — M5136 Other intervertebral disc degeneration, lumbar region: Secondary | ICD-10-CM | POA: Diagnosis not present

## 2022-08-25 DIAGNOSIS — I10 Essential (primary) hypertension: Secondary | ICD-10-CM | POA: Diagnosis not present

## 2022-08-25 DIAGNOSIS — M5126 Other intervertebral disc displacement, lumbar region: Secondary | ICD-10-CM | POA: Diagnosis not present

## 2022-08-25 DIAGNOSIS — J301 Allergic rhinitis due to pollen: Secondary | ICD-10-CM | POA: Diagnosis present

## 2022-08-25 DIAGNOSIS — Z6826 Body mass index (BMI) 26.0-26.9, adult: Secondary | ICD-10-CM | POA: Diagnosis not present

## 2022-08-25 DIAGNOSIS — I272 Pulmonary hypertension, unspecified: Secondary | ICD-10-CM | POA: Diagnosis present

## 2022-08-25 DIAGNOSIS — I444 Left anterior fascicular block: Secondary | ICD-10-CM | POA: Diagnosis not present

## 2022-08-25 DIAGNOSIS — I951 Orthostatic hypotension: Secondary | ICD-10-CM | POA: Diagnosis not present

## 2022-08-25 DIAGNOSIS — Z7982 Long term (current) use of aspirin: Secondary | ICD-10-CM

## 2022-08-25 DIAGNOSIS — R55 Syncope and collapse: Principal | ICD-10-CM | POA: Diagnosis present

## 2022-08-25 DIAGNOSIS — Z955 Presence of coronary angioplasty implant and graft: Secondary | ICD-10-CM

## 2022-08-25 DIAGNOSIS — R404 Transient alteration of awareness: Secondary | ICD-10-CM | POA: Diagnosis not present

## 2022-08-25 DIAGNOSIS — Z79899 Other long term (current) drug therapy: Secondary | ICD-10-CM

## 2022-08-25 DIAGNOSIS — E876 Hypokalemia: Secondary | ICD-10-CM | POA: Diagnosis present

## 2022-08-25 DIAGNOSIS — R29818 Other symptoms and signs involving the nervous system: Secondary | ICD-10-CM | POA: Diagnosis not present

## 2022-08-25 DIAGNOSIS — I482 Chronic atrial fibrillation, unspecified: Secondary | ICD-10-CM | POA: Diagnosis not present

## 2022-08-25 DIAGNOSIS — Z87891 Personal history of nicotine dependence: Secondary | ICD-10-CM

## 2022-08-25 DIAGNOSIS — I739 Peripheral vascular disease, unspecified: Secondary | ICD-10-CM | POA: Diagnosis not present

## 2022-08-25 DIAGNOSIS — G4733 Obstructive sleep apnea (adult) (pediatric): Secondary | ICD-10-CM | POA: Diagnosis not present

## 2022-08-25 DIAGNOSIS — Z85038 Personal history of other malignant neoplasm of large intestine: Secondary | ICD-10-CM

## 2022-08-25 DIAGNOSIS — R42 Dizziness and giddiness: Secondary | ICD-10-CM | POA: Diagnosis not present

## 2022-08-25 DIAGNOSIS — E860 Dehydration: Secondary | ICD-10-CM | POA: Diagnosis not present

## 2022-08-25 DIAGNOSIS — J449 Chronic obstructive pulmonary disease, unspecified: Secondary | ICD-10-CM | POA: Diagnosis present

## 2022-08-25 DIAGNOSIS — Z743 Need for continuous supervision: Secondary | ICD-10-CM | POA: Diagnosis not present

## 2022-08-25 DIAGNOSIS — Z7902 Long term (current) use of antithrombotics/antiplatelets: Secondary | ICD-10-CM

## 2022-08-25 DIAGNOSIS — R6889 Other general symptoms and signs: Secondary | ICD-10-CM | POA: Diagnosis not present

## 2022-08-25 DIAGNOSIS — Z95828 Presence of other vascular implants and grafts: Secondary | ICD-10-CM

## 2022-08-25 DIAGNOSIS — I4821 Permanent atrial fibrillation: Secondary | ICD-10-CM | POA: Diagnosis not present

## 2022-08-25 DIAGNOSIS — I6782 Cerebral ischemia: Secondary | ICD-10-CM | POA: Diagnosis not present

## 2022-08-25 LAB — URINALYSIS, ROUTINE W REFLEX MICROSCOPIC
Bilirubin Urine: NEGATIVE
Glucose, UA: NEGATIVE mg/dL
Hgb urine dipstick: NEGATIVE
Ketones, ur: NEGATIVE mg/dL
Leukocytes,Ua: NEGATIVE
Nitrite: NEGATIVE
Protein, ur: NEGATIVE mg/dL
Specific Gravity, Urine: 1.008 (ref 1.005–1.030)
pH: 7 (ref 5.0–8.0)

## 2022-08-25 LAB — CBC
HCT: 39.8 % (ref 39.0–52.0)
Hemoglobin: 13.3 g/dL (ref 13.0–17.0)
MCH: 30.6 pg (ref 26.0–34.0)
MCHC: 33.4 g/dL (ref 30.0–36.0)
MCV: 91.5 fL (ref 80.0–100.0)
Platelets: 161 10*3/uL (ref 150–400)
RBC: 4.35 MIL/uL (ref 4.22–5.81)
RDW: 14.5 % (ref 11.5–15.5)
WBC: 6.8 10*3/uL (ref 4.0–10.5)
nRBC: 0 % (ref 0.0–0.2)

## 2022-08-25 LAB — BASIC METABOLIC PANEL
Anion gap: 12 (ref 5–15)
BUN: 16 mg/dL (ref 8–23)
CO2: 27 mmol/L (ref 22–32)
Calcium: 9.5 mg/dL (ref 8.9–10.3)
Chloride: 92 mmol/L — ABNORMAL LOW (ref 98–111)
Creatinine, Ser: 0.9 mg/dL (ref 0.61–1.24)
GFR, Estimated: >60 mL/min (ref >60)
Glucose, Bld: 114 mg/dL — ABNORMAL HIGH (ref 70–99)
Potassium: 3.6 mmol/L (ref 3.5–5.1)
Sodium: 131 mmol/L — ABNORMAL LOW (ref 135–145)

## 2022-08-25 LAB — CBG MONITORING, ED: Glucose-Capillary: 161 mg/dL — ABNORMAL HIGH (ref 70–99)

## 2022-08-25 NOTE — ED Triage Notes (Signed)
Pt with c/o worsening dizziness over the last 2 weeks. Getting worse since fall 3 weeks ago where he hit his head and is on eliquis and plavix. Pt was not seen for fall. Today symptoms were worse. Stroke screen with ems NEG. Denies cp sob. Hr 40 but that is baseline at rest. Pt is in afib and has history of stents.

## 2022-08-26 ENCOUNTER — Emergency Department (HOSPITAL_COMMUNITY): Payer: Medicare Other

## 2022-08-26 ENCOUNTER — Encounter (HOSPITAL_COMMUNITY): Payer: Self-pay | Admitting: Internal Medicine

## 2022-08-26 DIAGNOSIS — I4891 Unspecified atrial fibrillation: Secondary | ICD-10-CM

## 2022-08-26 DIAGNOSIS — E871 Hypo-osmolality and hyponatremia: Secondary | ICD-10-CM

## 2022-08-26 DIAGNOSIS — Z9861 Coronary angioplasty status: Secondary | ICD-10-CM | POA: Diagnosis not present

## 2022-08-26 DIAGNOSIS — I2510 Atherosclerotic heart disease of native coronary artery without angina pectoris: Secondary | ICD-10-CM | POA: Diagnosis not present

## 2022-08-26 DIAGNOSIS — I951 Orthostatic hypotension: Secondary | ICD-10-CM | POA: Diagnosis not present

## 2022-08-26 DIAGNOSIS — R001 Bradycardia, unspecified: Secondary | ICD-10-CM

## 2022-08-26 DIAGNOSIS — I272 Pulmonary hypertension, unspecified: Secondary | ICD-10-CM

## 2022-08-26 DIAGNOSIS — R55 Syncope and collapse: Secondary | ICD-10-CM | POA: Diagnosis not present

## 2022-08-26 DIAGNOSIS — I739 Peripheral vascular disease, unspecified: Secondary | ICD-10-CM | POA: Diagnosis not present

## 2022-08-26 DIAGNOSIS — G4733 Obstructive sleep apnea (adult) (pediatric): Secondary | ICD-10-CM | POA: Diagnosis not present

## 2022-08-26 LAB — CBC
HCT: 37.1 % — ABNORMAL LOW (ref 39.0–52.0)
Hemoglobin: 12.6 g/dL — ABNORMAL LOW (ref 13.0–17.0)
MCH: 31.5 pg (ref 26.0–34.0)
MCHC: 34 g/dL (ref 30.0–36.0)
MCV: 92.8 fL (ref 80.0–100.0)
Platelets: 147 10*3/uL — ABNORMAL LOW (ref 150–400)
RBC: 4 MIL/uL — ABNORMAL LOW (ref 4.22–5.81)
RDW: 14.6 % (ref 11.5–15.5)
WBC: 7 10*3/uL (ref 4.0–10.5)
nRBC: 0 % (ref 0.0–0.2)

## 2022-08-26 LAB — TROPONIN I (HIGH SENSITIVITY)
Troponin I (High Sensitivity): 12 ng/L (ref <18)
Troponin I (High Sensitivity): 18 ng/L — ABNORMAL HIGH (ref <18)

## 2022-08-26 LAB — BASIC METABOLIC PANEL
Anion gap: 9 (ref 5–15)
BUN: 15 mg/dL (ref 8–23)
CO2: 25 mmol/L (ref 22–32)
Calcium: 8.9 mg/dL (ref 8.9–10.3)
Chloride: 97 mmol/L — ABNORMAL LOW (ref 98–111)
Creatinine, Ser: 0.77 mg/dL (ref 0.61–1.24)
GFR, Estimated: >60 mL/min (ref >60)
Glucose, Bld: 105 mg/dL — ABNORMAL HIGH (ref 70–99)
Potassium: 3.3 mmol/L — ABNORMAL LOW (ref 3.5–5.1)
Sodium: 131 mmol/L — ABNORMAL LOW (ref 135–145)

## 2022-08-26 LAB — PROTIME-INR
INR: 1.3 — ABNORMAL HIGH (ref 0.8–1.2)
Prothrombin Time: 16.1 seconds — ABNORMAL HIGH (ref 11.4–15.2)

## 2022-08-26 LAB — BRAIN NATRIURETIC PEPTIDE: B Natriuretic Peptide: 61.9 pg/mL (ref 0.0–100.0)

## 2022-08-26 LAB — TSH: TSH: 2.798 u[IU]/mL (ref 0.350–4.500)

## 2022-08-26 LAB — MAGNESIUM: Magnesium: 2.2 mg/dL (ref 1.7–2.4)

## 2022-08-26 MED ORDER — POTASSIUM CHLORIDE CRYS ER 20 MEQ PO TBCR
20.0000 meq | EXTENDED_RELEASE_TABLET | Freq: Once | ORAL | Status: AC
  Administered 2022-08-26: 20 meq via ORAL
  Filled 2022-08-26: qty 1

## 2022-08-26 MED ORDER — ACETAMINOPHEN 325 MG PO TABS: 650.0000 mg | ORAL_TABLET | Freq: Four times a day (QID) | ORAL | Status: DC | PRN

## 2022-08-26 MED ORDER — CLOPIDOGREL BISULFATE 75 MG PO TABS
75.0000 mg | ORAL_TABLET | Freq: Every day | ORAL | Status: DC
  Administered 2022-08-26: 75 mg via ORAL
  Filled 2022-08-26: qty 1

## 2022-08-26 MED ORDER — MELATONIN 3 MG PO TABS
3.0000 mg | ORAL_TABLET | Freq: Every evening | ORAL | Status: DC | PRN
  Administered 2022-08-26 – 2022-08-27 (×2): 3 mg via ORAL
  Filled 2022-08-26 (×2): qty 1

## 2022-08-26 MED ORDER — APIXABAN 5 MG PO TABS
5.0000 mg | ORAL_TABLET | Freq: Two times a day (BID) | ORAL | Status: DC
  Administered 2022-08-26 (×2): 5 mg via ORAL
  Filled 2022-08-26 (×2): qty 1

## 2022-08-26 MED ORDER — LACTATED RINGERS IV BOLUS
500.0000 mL | Freq: Once | INTRAVENOUS | Status: AC
  Administered 2022-08-26: 500 mL via INTRAVENOUS

## 2022-08-26 MED ORDER — POTASSIUM CHLORIDE CRYS ER 20 MEQ PO TBCR
40.0000 meq | EXTENDED_RELEASE_TABLET | Freq: Two times a day (BID) | ORAL | Status: AC
  Administered 2022-08-26 (×2): 40 meq via ORAL
  Filled 2022-08-26 (×2): qty 2

## 2022-08-26 MED ORDER — SENNOSIDES-DOCUSATE SODIUM 8.6-50 MG PO TABS: 1.0000 | ORAL_TABLET | Freq: Every evening | ORAL | Status: DC | PRN

## 2022-08-26 MED ORDER — SODIUM CHLORIDE 0.9% FLUSH
3.0000 mL | Freq: Two times a day (BID) | INTRAVENOUS | Status: DC
  Administered 2022-08-26 – 2022-08-28 (×5): 3 mL via INTRAVENOUS

## 2022-08-26 MED ORDER — ASPIRIN 81 MG PO CHEW
81.0000 mg | CHEWABLE_TABLET | Freq: Every day | ORAL | Status: DC
  Administered 2022-08-26: 81 mg via ORAL
  Filled 2022-08-26: qty 1

## 2022-08-26 MED ORDER — ONDANSETRON HCL 4 MG/2ML IJ SOLN: 4.0000 mg | Freq: Four times a day (QID) | INTRAMUSCULAR | Status: DC | PRN

## 2022-08-26 MED ORDER — ACETAMINOPHEN 650 MG RE SUPP: 650.0000 mg | Freq: Four times a day (QID) | RECTAL | Status: DC | PRN

## 2022-08-26 MED ORDER — ATORVASTATIN CALCIUM 80 MG PO TABS
80.0000 mg | ORAL_TABLET | Freq: Every day | ORAL | Status: DC
  Administered 2022-08-26 – 2022-08-28 (×3): 80 mg via ORAL
  Filled 2022-08-26 (×3): qty 1

## 2022-08-26 MED ORDER — ONDANSETRON HCL 4 MG PO TABS: 4.0000 mg | ORAL_TABLET | Freq: Four times a day (QID) | ORAL | Status: DC | PRN

## 2022-08-26 NOTE — ED Notes (Signed)
Cardiology at bedside.

## 2022-08-26 NOTE — ED Provider Notes (Signed)
[place] [facility] AT [facility] Provider Note   CSN: [id number] Arrival date & time: 08/25/22  2203     History  Chief Complaint  Patient presents with   Dizziness   Weakness    [name] is a 82 y.o. male.  82 y.o. male with a hx listed below, significant for CAD status post RCA stent in 1999, CABG 2019; colon cancer status post right colectomy, obesity, hypertension, atrial fibrillation presenting with generalized weakness, dizziness and recurrent falls.  States over the past several weeks he has had episodes where he gets generally weak in his bilateral legs has to sit down.  Symptoms last for several hours at a time.  Today he had severe generalized weakness and was unable to stand or ambulate due to weakness in his legs.  Did not fall or hit his head.  Denies any room spinning dizziness but does feel lightheaded most of the time.  Has a slow heart rate at baseline which she states is unchanged.  He was told he did not need a pacemaker in the past.  Does have a history of atrial fibrillation and takes Eliquis.  he is also on Plavix.  Today he is concerned that he has ongoing generalized weakness in his legs and difficulty ambulating.  He denies any headache, chest pain, shortness of breath, nausea, vomiting.  No pain with urination or blood in the urine.  Good p.o. intake and urine output.  No vomiting or diarrhea  The history is provided by the patient.  Dizziness Associated symptoms: weakness   Associated symptoms: no chest pain, no headaches, no nausea, no shortness of breath and no vomiting   Weakness Associated symptoms: dizziness   Associated symptoms: no abdominal pain, no arthralgias, no chest pain, no cough, no dysuria, no headaches, no myalgias, no nausea, no shortness of breath and no vomiting        Home Medications Prior to Admission medications   Medication Sig Start Date End Date Taking? Authorizing Provider  Alpha-Lipoic Acid 600 MG  TABS Take 600 mg by mouth 2 (two) times daily.    [provider]  aspirin 81 MG chewable tablet Chew 81 mg by mouth daily.    [provider]  atorvastatin (LIPITOR) 80 MG tablet TAKE 1 TABLET AT 6PM. 02/14/22   [name], MD  cholecalciferol (VITAMIN D) 1000 UNITS tablet Take 1,000 Units by mouth daily.    [provider]  clopidogrel (PLAVIX) 75 MG tablet Take 1 tablet (75 mg total) by mouth daily with breakfast. 12/22/21   [facility], Roe Rutherford, PA  Coenzyme Q10 200 MG capsule Take 200 mg by mouth daily.     [provider]  Cyanocobalamin (VITAMIN B 12 PO) Take 200 mcg by mouth daily.    [provider]  ELIQUIS 5 MG TABS tablet TAKE 1 TABLET BY MOUTH TWICE  DAILY 03/28/22   [name], MD  Flaxseed, Linseed, 1000 MG CAPS 1400  mg Orally once a day    [provider]  furosemide (LASIX) 40 MG tablet Take 1 tablet (40 mg total) by mouth in the morning AND 1 tablet (40 mg total) every evening. 07/16/22   [name], MD  Magnesium Citrate 200 MG TABS Take 400 mg by mouth daily in the afternoon.    [provider]  metolazone (ZAROXOLYN) 2.5 MG tablet Take 1 tablet (2.5 mg total) by mouth 2 (two) times a week. Every Mon and  Fri 11/16/21   Bensimhon, Bevelyn Buckles, MD  Polyethyl Glycol-Propyl Glycol (SYSTANE) 0.4-0.3 % GEL ophthalmic gel Place 1 application. into both eyes every 8 (eight) hours as needed (dry eyes).    [provider]  pyridoxine (B-6) 200 MG tablet Take 200 mg by mouth daily.    [provider]  spironolactone (ALDACTONE) 25 MG tablet Take 1/2 TABLET (12.5 mg total) by mouth daily. 06/11/22   Bensimhon, Bevelyn Buckles, MD  vitamin C (ASCORBIC ACID) 500 MG tablet Take 500 mg by mouth 3 (three) times daily.     [provider]  lisinopril (PRINIVIL,ZESTRIL) 20 MG tablet Take 1 tablet (20 mg total) by mouth daily. HOLD UNTIL YOUR APPT WITH DR Cecelia Byars Patient not taking: No sig reported  02/11/18 01/25/20  Abelino Derrick, PA-C      Allergies    Patient has no known allergies.    Review of Systems   Review of Systems  Constitutional:  Positive for fatigue. Negative for activity change and appetite change.  HENT:  Negative for congestion and rhinorrhea.   Respiratory:  Negative for cough, chest tightness and shortness of breath.   Cardiovascular:  Negative for chest pain.  Gastrointestinal:  Negative for abdominal pain, nausea and vomiting.  Genitourinary:  Negative for dysuria and hematuria.  Musculoskeletal:  Negative for arthralgias and myalgias.  Skin:  Negative for rash.  Neurological:  Positive for dizziness, weakness and light-headedness. Negative for headaches.   all other systems are negative except as noted in the HPI and PMH.    Physical Exam Updated Vital Signs BP (!) 158/47   Pulse (!) 42   Resp 16   Ht 6\' 3"  (1.905 m)   Wt 95.4 kg   SpO2 99%   BMI 26.29 kg/m  Physical Exam Vitals and nursing note reviewed.  Constitutional:      General: He is not in acute distress.    Appearance: He is well-developed. He is not ill-appearing.  HENT:     Head: Normocephalic.     Comments: Erythematous nodule to posterior scalp (patient reports wound he applied super glue to)    Mouth/Throat:     Pharynx: No oropharyngeal exudate.  Eyes:     Conjunctiva/sclera: Conjunctivae normal.     Pupils: Pupils are equal, round, and reactive to light.  Neck:     Comments: No meningismus. Cardiovascular:     Rate and Rhythm: Bradycardia present. Rhythm irregular.     Heart sounds: Normal heart sounds. No murmur heard. Pulmonary:     Effort: Pulmonary effort is normal. No respiratory distress.     Breath sounds: Normal breath sounds.  Abdominal:     Palpations: Abdomen is soft.     Tenderness: There is no abdominal tenderness. There is no guarding or rebound.  Musculoskeletal:        General: No tenderness. Normal range of motion.     Cervical back: Normal range of  motion and neck supple.     Comments: Ecchymosis to lower extremities with swelling which patient states is baseline.  Intact DP and PT pulses with Doppler. 5/5 strength in bilateral lower extremities. Ankle plantar and dorsiflexion intact. Great toe extension intact bilaterally. +2 DP and PT pulses. +2 patellar reflexes bilaterally.    Skin:    General: Skin is warm.  Neurological:     Mental Status: He is alert and oriented to person, place, and time.     Cranial Nerves: No cranial nerve deficit.  Motor: No abnormal muscle tone.     Coordination: Coordination normal.     Comments: No ataxia on finger to nose bilaterally. No pronator drift. 5/5 strength throughout. CN 2-12 intact.Equal grip strength. Sensation intact.   Psychiatric:        Behavior: Behavior normal.     ED Results / Procedures / Treatments   Labs (all labs ordered are listed, but only abnormal results are displayed) Labs Reviewed  BASIC METABOLIC PANEL - Abnormal; Notable for the following components:      Result Value   Sodium 131 (*)    Chloride 92 (*)    Glucose, Bld 114 (*)    All other components within normal limits  PROTIME-INR - Abnormal; Notable for the following components:   Prothrombin Time 16.1 (*)    INR 1.3 (*)    All other components within normal limits  BASIC METABOLIC PANEL - Abnormal; Notable for the following components:   Sodium 131 (*)    Potassium 3.3 (*)    Chloride 97 (*)    Glucose, Bld 105 (*)    All other components within normal limits  CBC - Abnormal; Notable for the following components:   RBC 4.00 (*)    Hemoglobin 12.6 (*)    HCT 37.1 (*)    Platelets 147 (*)    All other components within normal limits  CBG MONITORING, ED - Abnormal; Notable for the following components:   Glucose-Capillary 161 (*)    All other components within normal limits  TROPONIN I (HIGH SENSITIVITY) - Abnormal; Notable for the following components:   Troponin I (High Sensitivity) 18 (*)     All other components within normal limits  CBC  URINALYSIS, ROUTINE W REFLEX MICROSCOPIC  BRAIN NATRIURETIC PEPTIDE  MAGNESIUM  TSH  TROPONIN I (HIGH SENSITIVITY)    EKG EKG Interpretation  Date/Time:  Saturday August 25 2022 22:12:22 EDT Ventricular Rate:  46 PR Interval:    QRS Duration: 140 QT Interval:  512 QTC Calculation: 448 R Axis:   -59 Text Interpretation: Wide QRS rhythm Right bundle branch block Left anterior fascicular block * Bifascicular block ** Abnormal ECG When compared with ECG of 29-Jun-2022 15:56, PREVIOUS ECG IS PRESENT Rate slower Confirmed by Glynn Octave [phone]) on 08/25/2022 11:54:34 PM  Radiology CT Head Wo Contrast  Result Date: 08/25/2022 CLINICAL DATA:  Stroke-like symptoms EXAM: CT HEAD WITHOUT CONTRAST TECHNIQUE: Contiguous axial images were obtained from the base of the skull through the vertex without intravenous contrast. RADIATION DOSE REDUCTION: This exam was performed according to the departmental dose-optimization program which includes automated exposure control, adjustment of the mA and/or kV according to patient size and/or use of iterative reconstruction technique. COMPARISON:  01/25/2020 FINDINGS: Brain: No evidence of acute infarction, hemorrhage, hydrocephalus, extra-axial collection or mass lesion/mass effect.Chronic atrophic changes and white matter ischemic changes are noted. Vascular: No hyperdense vessel or unexpected calcification. Skull: Normal. Negative for fracture or focal lesion. Sinuses/Orbits: No acute finding. Other: None. IMPRESSION: Chronic changes without acute abnormality. Electronically Signed   By: Alcide Clever M.D.   On: 08/25/2022 22:51    Procedures Procedures    Medications Ordered in ED Medications - No data to display  ED Course/ Medical Decision Making/ A&P                             Medical Decision Making Amount and/or Complexity of Data Reviewed Independent Historian: EMS Labs: ordered. Decision-making  details documented in ED Course. Radiology: ordered and independent interpretation performed. Decision-making details documented in ED Course. ECG/medicine tests: ordered and independent interpretation performed. Decision-making details documented in ED Course.  Risk Decision regarding hospitalization.   Generalized weakness with near syncopal episodes for the past several weeks.  Bradycardic in the 40s on arrival.  No focal neurological deficits identified on exam.  5/5 strength in lower extremities bilaterally.  Intact distal pulses.  CT head in triage is negative for hemorrhage  Patient not on any AV nodal blockers.  Discussed with Dr. Paulino Rily with cardiology who reviewed EKG.  Has a slow A-fib which appears to be chronic.  Does not feel this is complete heart block and does not feel his slow heart rate is responsible for his near syncopal episodes and weakness.  Recommends checking orthostatics and holding his diuretics with medical admission.  Orthostatics negative. But symptomatic with standing. Gentle IVF given.   Hold diuretics, hydrate gently.  MRI brain ordered to rule out CVA as cause of weakness though seems less likely. MRI lumbar spine to r/o spinal cord pathology as source of perceived leg weakness though no weakness appreciated on exam. Reflexes intact. Low suspicion for GBS or transverse myelitis.   Dw/ Dr. Antionette Char.         Final Clinical Impression(s) / ED Diagnoses Final diagnoses:  Near syncope  Orthostasis    Rx / DC Orders ED Discharge Orders     None         Tara Wich, Jeannett Senior, MD 08/26/22 [phone]

## 2022-08-26 NOTE — ED Notes (Signed)
Patient left the floor in stable condition, AOX4, with his belongings and staff.  

## 2022-08-26 NOTE — H&P (Signed)
History and Physical    [name] ZOX:[id number] DOB: June 22, 1940 DOA: 08/25/2022  PCP: Patient, No Pcp Per   Patient coming from: Home   Chief Complaint: Lightheaded on standing, general weakness   HPI: [name] is a pleasant 82 y.o. male with medical history significant for atrial fibrillation with slow ventricular response on Eliquis, pulmonary hypertension, CAD, and OSA who presents to the emergency department with lightheadedness and generalized weakness.  Patient has been experiencing lightheadedness upon standing for roughly 2 weeks now.  This has been worsening and was particularly severe last night when he was unable to stand up due to feeling as though he would pass out.  He denies any actual loss of consciousness, denies chest pain, and does not experience lightheadedness when seated or supine.  He wore a Zio patch in April and average heart rate was 51.  ED Course: Upon arrival to the ED, patient is found to be afebrile and saturating well on room air with low heart rate and stable blood pressure.  EKG features slow atrial fibrillation with rate 46, RBBB, and LAFB.  Labs are most notable for sodium 131, normal renal function, normal troponin, and normal BNP.  MRI brain is negative for acute intracranial abnormality.  Chest x-ray is negative for acute findings.  Cardiology was consulted by the ED physician and the patient was given 500 mL of LR.  Review of Systems:  All other systems reviewed and apart from HPI, are negative.  Past Medical History:  Diagnosis Date   A-fib (HCC)    Allergy    Seasonal--pollen   Arrhythmia    CAD (coronary artery disease) 1999   RCA stent   Carotid stenosis, asymptomatic    moderate RT and mild LT with carotid dopplers 10/26/11   Colon cancer (HCC) 04/2006   Stage III--T3 N1   COPD (chronic obstructive pulmonary disease) (HCC)    H/O cardiovascular stress test 01/13/2010   low risk scan, similar to 2008 study   H/O  echocardiogram 05/23/2009   EF >55%, mild MR, Mild TR,    Hand foot syndrome    Hemorrhoids    Hyperlipidemia    Hypertension    Neuropathy    Permanent atrial fibrillation (HCC)    Psoriasis    Tubular adenoma of colon     Past Surgical History:  Procedure Laterality Date   ABDOMINAL AORTOGRAM W/LOWER EXTREMITY N/A 12/21/2021   Procedure: ABDOMINAL AORTOGRAM W/LOWER EXTREMITY;  Surgeon: [name], MD;  Location: [facility];  Service: Cardiovascular;  Laterality: N/A;   CARDIAC CATHETERIZATION     CLIPPING OF ATRIAL APPENDAGE  01/28/2018   Procedure: CLIPPING OF ATRIAL APPENDAGE;  Surgeon: Alleen Borne, MD;  Location: [facility] OR;  Service: Open Heart Surgery;;   COLONOSCOPY     CORONARY ARTERY BYPASS GRAFT N/A 01/28/2018   Procedure: CORONARY ARTERY BYPASS GRAFTING (CABG) X 4 USING LEFT INTERNAL MAMMARY ARTERY AND LEFT GREATER SAPHENOUS VEIN HARVESTED ENDOSCOPICALLY;  Surgeon: Alleen Borne, MD;  Location: [facility] OR;  Service: Open Heart Surgery;  Laterality: N/A;   CORONARY STENT PLACEMENT  1999   RCA tandem NIR stents   LEFT HEART CATH AND CORONARY ANGIOGRAPHY N/A 01/23/2018   Procedure: LEFT HEART CATH AND CORONARY ANGIOGRAPHY;  Surgeon: Yvonne Kendall, MD;  Location: [facility];  Service: Cardiovascular;  Laterality: N/A;   PERIPHERAL VASCULAR ATHERECTOMY Bilateral 12/21/2021   Procedure: PERIPHERAL VASCULAR ATHERECTOMY;  Surgeon: [name], MD;  Location: [facility]  INVASIVE CV LAB;  Service: Cardiovascular;  Laterality: Bilateral;  common illiac   PERIPHERAL VASCULAR INTERVENTION Bilateral 12/21/2021   Procedure: PERIPHERAL VASCULAR INTERVENTION;  Surgeon: [name], MD;  Location: [facility];  Service: Cardiovascular;  Laterality: Bilateral;  Common Illiacs   RIGHT COLECTOMY  05/29/2006   Laparoscopic assisted   RIGHT HEART CATH N/A 06/14/2021   Procedure: RIGHT HEART CATH;  Surgeon: Dolores Patty, MD;  Location: [facility];   Service: Cardiovascular;  Laterality: N/A;   TEE WITHOUT CARDIOVERSION N/A 01/28/2018   Procedure: TRANSESOPHAGEAL ECHOCARDIOGRAM (TEE);  Surgeon: Alleen Borne, MD;  Location: [facility] OR;  Service: Open Heart Surgery;  Laterality: N/A;   TONSILLECTOMY      Social History:   reports that he quit smoking about 28 years ago. His smoking use included cigarettes. He has never used smokeless tobacco. He reports current alcohol use of about 1.0 standard drink of alcohol per week. He reports that he does not use drugs.  No Known Allergies  Family History  Problem Relation Age of Onset   Heart disease Father 57   Heart disease Paternal Grandmother    Heart disease Mother    Hypertension Mother    Healthy Sister    Heart disease Paternal Grandfather    Hypertension Paternal Grandfather    Colon cancer Neg Hx      Prior to Admission medications   Medication Sig Start Date End Date Taking? Authorizing Provider  Alpha-Lipoic Acid 600 MG TABS Take 600 mg by mouth 2 (two) times daily.    [provider]  aspirin 81 MG chewable tablet Chew 81 mg by mouth daily.    [provider]  atorvastatin (LIPITOR) 80 MG tablet TAKE 1 TABLET AT 6PM. 02/14/22   [name], MD  cholecalciferol (VITAMIN D) 1000 UNITS tablet Take 1,000 Units by mouth daily.    [provider]  clopidogrel (PLAVIX) 75 MG tablet Take 1 tablet (75 mg total) by mouth daily with breakfast. 12/22/21   [facility], Roe Rutherford, PA  Coenzyme Q10 200 MG capsule Take 200 mg by mouth daily.     [provider]  Cyanocobalamin (VITAMIN B 12 PO) Take 200 mcg by mouth daily.    [provider]  ELIQUIS 5 MG TABS tablet TAKE 1 TABLET BY MOUTH TWICE  DAILY 03/28/22   [name], MD  Flaxseed, Linseed, 1000 MG CAPS 1400  mg Orally once a day    [provider]  furosemide (LASIX) 40 MG tablet Take 1 tablet (40 mg total) by mouth in the morning AND 1 tablet (40 mg total) every evening.  07/16/22   [name], MD  Magnesium Citrate 200 MG TABS Take 400 mg by mouth daily in the afternoon.    [provider]  metolazone (ZAROXOLYN) 2.5 MG tablet Take 1 tablet (2.5 mg total) by mouth 2 (two) times a week. Every Mon and Fri 11/16/21   Bensimhon, Bevelyn Buckles, MD  Polyethyl Glycol-Propyl Glycol (SYSTANE) 0.4-0.3 % GEL ophthalmic gel Place 1 application. into both eyes every 8 (eight) hours as needed (dry eyes).    [provider]  pyridoxine (B-6) 200 MG tablet Take 200 mg by mouth daily.    [provider]  spironolactone (ALDACTONE) 25 MG tablet Take 1/2 TABLET (12.5 mg total) by mouth daily. 06/11/22   Bensimhon, Bevelyn Buckles, MD  vitamin C (ASCORBIC ACID) 500 MG tablet Take 500 mg by mouth 3 (three)  times daily.     [provider]  lisinopril (PRINIVIL,ZESTRIL) 20 MG tablet Take 1 tablet (20 mg total) by mouth daily. HOLD UNTIL YOUR APPT WITH DR Cecelia Byars Patient not taking: No sig reported 02/11/18 01/25/20  Abelino Derrick, PA-C    Physical Exam: Vitals:   08/26/22 0117 08/26/22 0120 08/26/22 0155 08/26/22 0312  BP: (!) 148/59 (!) 167/41 107/74 125/61  Pulse: (!) 44 (!) 50 (!) 43 (!) 37  Resp: 15 15 19 19   Temp:    98.4 F (36.9 C)  TempSrc:    Oral  SpO2: 100% 99% 98% 100%  Weight:      Height:         Constitutional: NAD, no pallor or diaphoresis  Eyes: PERTLA, lids and conjunctivae normal ENMT: Mucous membranes are moist. Posterior pharynx clear of any exudate or lesions.   Neck: supple, no masses  Respiratory: no wheezing, no crackles. No accessory muscle use.  Cardiovascular: S1 & S2 heard, regular rate and rhythm. Bilateral lower extremity edema.  Abdomen: No distension, no tenderness, soft. Bowel sounds active.  Musculoskeletal: no clubbing / cyanosis. No joint deformity upper and lower extremities.   Skin: Distal LE hyperpigmentation in gaiter distribution bilaterally. Warm, dry, well-perfused. Neurologic: CN 2-12 grossly  intact. Moving all extremities. Alert and oriented.  Psychiatric: Pleasant. Cooperative.    Labs and Imaging on Admission: I have personally reviewed following labs and imaging studies  CBC: Recent Labs  Lab 08/25/22 2220  WBC 6.8  HGB 13.3  HCT 39.8  MCV 91.5  PLT 161   Basic Metabolic Panel: Recent Labs  Lab 08/25/22 2220  NA 131*  K 3.6  CL 92*  CO2 27  GLUCOSE 114*  BUN 16  CREATININE 0.90  CALCIUM 9.5   GFR: Estimated Creatinine Clearance: 79.5 mL/min (by C-G formula based on SCr of 0.9 mg/dL). Liver Function Tests: No results for input(s): "AST", "ALT", "ALKPHOS", "BILITOT", "PROT", "ALBUMIN" in the last 168 hours. No results for input(s): "LIPASE", "AMYLASE" in the last 168 hours. No results for input(s): "AMMONIA" in the last 168 hours. Coagulation Profile: Recent Labs  Lab 08/25/22 2220  INR 1.3*   Cardiac Enzymes: No results for input(s): "CKTOTAL", "CKMB", "CKMBINDEX", "TROPONINI" in the last 168 hours. BNP (last 3 results) No results for input(s): "PROBNP" in the last 8760 hours. HbA1C: No results for input(s): "HGBA1C" in the last 72 hours. CBG: Recent Labs  Lab 08/25/22 2219  GLUCAP 161*   Lipid Profile: No results for input(s): "CHOL", "HDL", "LDLCALC", "TRIG", "CHOLHDL", "LDLDIRECT" in the last 72 hours. Thyroid Function Tests: No results for input(s): "TSH", "T4TOTAL", "FREET4", "T3FREE", "THYROIDAB" in the last 72 hours. Anemia Panel: No results for input(s): "VITAMINB12", "FOLATE", "FERRITIN", "TIBC", "IRON", "RETICCTPCT" in the last 72 hours. Urine analysis:    Component Value Date/Time   COLORURINE YELLOW 08/25/2022 2219   APPEARANCEUR CLEAR 08/25/2022 2219   LABSPEC 1.008 08/25/2022 2219   LABSPEC 1.005 08/21/2006 1145   PHURINE 7.0 08/25/2022 2219   GLUCOSEU NEGATIVE 08/25/2022 2219   HGBUR NEGATIVE 08/25/2022 2219   BILIRUBINUR NEGATIVE 08/25/2022 2219   BILIRUBINUR Negative 08/21/2006 1145   KETONESUR NEGATIVE 08/25/2022  2219   PROTEINUR NEGATIVE 08/25/2022 2219   UROBILINOGEN 0.2 05/07/2010 1911   NITRITE NEGATIVE 08/25/2022 2219   LEUKOCYTESUR NEGATIVE 08/25/2022 2219   LEUKOCYTESUR Trace 08/21/2006 1145   Sepsis Labs: @LABRCNTIP (procalcitonin:4,lacticidven:4) )No results found for this or any previous visit (from the past 240 hour(s)).   Radiological Exams on Admission:  MR LUMBAR SPINE WO CONTRAST  Result Date: 08/26/2022 CLINICAL DATA:  Initial evaluation for acute myelopathy. EXAM: MRI LUMBAR SPINE WITHOUT CONTRAST TECHNIQUE: Multiplanar, multisequence MR imaging of the lumbar spine was performed. No intravenous contrast was administered. COMPARISON:  None Available. FINDINGS: Segmentation: Standard. Lowest well-formed disc space labeled the L5-S1 level. Alignment: Physiologic with preservation of the normal lumbar lordosis. No listhesis. Vertebrae: Vertebral body height maintained without acute or chronic fracture. Bone marrow signal intensity within normal limits. No worrisome osseous lesions. Degenerative endplate Schmorl's node deformity with prominent reactive marrow edema present at the inferior endplate of L3. Additional mild reactive endplate changes noted about the L4-5 interspace. Conus medullaris and cauda equina: Conus extends to the T12 level. Conus and cauda equina appear normal. Paraspinal and other soft tissues: Mild edema within the posterior paraspinous soft tissues at the levels of L2-3 and L3-4, which could be reactive in nature due to facet disease or possibly reflect mild muscular injury/strain. No other acute finding. Disc levels: T12-L1: Disc desiccation without disc bulge. Mild facet spurring. No canal or foraminal stenosis. L1-2: Disc desiccation with mild circumferential disc bulge. Mild facet hypertrophy. No spinal stenosis. Foramina remain patent. L2-3: Mild disc bulge. Mild facet and ligament flavum hypertrophy. No spinal stenosis. Mild bilateral L2 foraminal narrowing. L3-4: Disc  desiccation with mild disc bulge. Prominent endplate Schmorl's node deformity at the inferior endplate of L3 with associated reactive marrow edema. Associated reactive endplate change with marginal endplate osteophytic spurring, greater on the left. Mild facet and ligament flavum hypertrophy. No significant spinal stenosis. Moderate bilateral L3 foraminal narrowing. L4-5: Disc desiccation with diffuse disc bulge, slightly asymmetric to the left. Associated annular fissure at the level of the left neural foramen. Mild to moderate facet hypertrophy, greater on the left. Resultant mild narrowing of the left lateral recess. Central canal remains patent. Moderate left worse than right L4 foraminal stenosis. L5-S1: Endplate osteophytic spurring without significant disc bulge. Mild facet hypertrophy. No canal or foraminal stenosis. IMPRESSION: 1. Normal MRI appearance of the conus and cauda equina. 2. Mild edema within the posterior paraspinous soft tissues at the levels of L2-3 and L3-4, which could be reactive in nature due to facet disease or possibly reflect mild muscular injury/strain. 3. Multifactorial degenerative changes at L3-4 and L4-5 with resultant moderate bilateral L3 and L4 foraminal stenosis. 4. Degenerative endplate Schmorl's node deformity with associated reactive marrow edema at the inferior endplate of L3. Finding could serve as a source for lower back pain. Electronically Signed   By: Rise Mu M.D.   On: 08/26/2022 03:56   MR BRAIN WO CONTRAST  Result Date: 08/26/2022 CLINICAL DATA:  Initial evaluation for neuro deficit, stroke. EXAM: MRI HEAD WITHOUT CONTRAST TECHNIQUE: Multiplanar, multiecho pulse sequences of the brain and surrounding structures were obtained without intravenous contrast. COMPARISON:  Prior CT from 08/25/2022. FINDINGS: Brain: Mild age-related cerebral atrophy. Patchy T2/FLAIR hyperintensity involving the periventricular deep white matter both cerebral hemispheres,  consistent with chronic small vessel ischemic disease, mild to moderate in nature. No evidence for acute or subacute ischemia. Gray-white matter differentiation maintained. Or is a chronic cortical infarction. No acute or chronic intracranial blood products. No mass lesion, mass effect or midline shift. No hydrocephalus or extra-axial fluid collection. Pituitary gland and suprasellar region within normal limits. Vascular: Major intracranial vascular flow voids are maintained. Skull and upper cervical spine: Craniocervical junction within normal limits. Bone marrow signal intensity normal. 1 cm nodular lesion present at the posterior scalp vertex, indeterminate (series  10, image 27). Sinuses/Orbits: Prior bilateral ocular lens replacement. Paranasal sinuses are clear. Trace left mastoid effusion, of doubtful significance. Other: None. IMPRESSION: 1. No acute intracranial abnormality. 2. Age-related cerebral atrophy with mild to moderate chronic microvascular ischemic disease. 3. 1 cm nodular lesion at the posterior scalp vertex, indeterminate. Correlation with physical exam recommended. Electronically Signed   By: Rise Mu M.D.   On: 08/26/2022 03:47   DG Chest Portable 1 View  Result Date: 08/26/2022 CLINICAL DATA:  Shortness of breath, dizziness.  Fall 3 weeks ago. EXAM: PORTABLE CHEST 1 VIEW COMPARISON:  07/06/2021. FINDINGS: The heart is enlarged and the mediastinal contour is stable. There is atherosclerotic calcification of the aorta. Sternotomy wires and a left atrial appendage clip are noted. No consolidation, effusion, or pneumothorax. No acute osseous abnormality. IMPRESSION: Cardiomegaly with no active disease. Electronically Signed   By: Thornell Sartorius M.D.   On: 08/26/2022 02:37   CT Head Wo Contrast  Result Date: 08/25/2022 CLINICAL DATA:  Stroke-like symptoms EXAM: CT HEAD WITHOUT CONTRAST TECHNIQUE: Contiguous axial images were obtained from the base of the skull through the vertex  without intravenous contrast. RADIATION DOSE REDUCTION: This exam was performed according to the departmental dose-optimization program which includes automated exposure control, adjustment of the mA and/or kV according to patient size and/or use of iterative reconstruction technique. COMPARISON:  01/25/2020 FINDINGS: Brain: No evidence of acute infarction, hemorrhage, hydrocephalus, extra-axial collection or mass lesion/mass effect.Chronic atrophic changes and white matter ischemic changes are noted. Vascular: No hyperdense vessel or unexpected calcification. Skull: Normal. Negative for fracture or focal lesion. Sinuses/Orbits: No acute finding. Other: None. IMPRESSION: Chronic changes without acute abnormality. Electronically Signed   By: Alcide Clever M.D.   On: 08/25/2022 22:51    EKG: Independently reviewed. Slow AF, rate 46, RBBB, LAFB.   Assessment/Plan   1. Lightheadedness; near-syncope  - Appreciate cardiology assesment and recommendations, likely related to orthostasis  - He was given 500 mL LR in ED  - Hold diuretics, check echocardiogram    2. Atrial fibrillation with slow ventricular response  - Continue Eliquis   3. CAD - No anginal complaints  - Continue Lipitor and DAPT    4. OSA  - Continue BiPAP qHS     5. Hyponatremia  - Mild, in setting of hypovolemia  - He was given 500 mL LR in ED  - Hold diuretics, monitor  6. PAD - No acute ischemia  - Continue statin and antiplatelets    7. Pulmonary HTN  - Hold diuretics as above    DVT prophylaxis: Eliquis  Code Status: Full  Level of Care: Level of care: Telemetry Cardiac Family Communication: None present   Disposition Plan:  Patient is from: home  Anticipated d/c is to: Home  Anticipated d/c date is: 6/10 or 08/28/22  Patient currently: Pending improved lightheadedness with hydration, cardiac monitoring, echocardiogram  Consults called: Cardiology  Admission status: Observation     Briscoe Deutscher, MD [facility]  08/26/2022, 4:08 AM

## 2022-08-26 NOTE — ED Notes (Signed)
Pt transported to MRI with this RN 

## 2022-08-26 NOTE — ED Notes (Signed)
Patient awake and alert this morning, no s/s of distress, able to verbalize his needs, per night shift patients heart rate has been bradycardic and providers are aware.

## 2022-08-26 NOTE — ED Notes (Signed)
Pt returned to ED after MRI

## 2022-08-26 NOTE — Consult Note (Signed)
Cardiology Consultation   Patient ID: [name] MRN: [id number]; DOB: 08/22/1940  Admit date: 08/25/2022 Date of Consult: 08/26/2022  PCP:  Patient, No Pcp Per   [facility] Providers Cardiologist:  Nanetta Batty, MD       Patient Profile:   [name] is a 82 y.o. male with a hx of HTN, CAD, OSA, who is being seen 08/26/2022 for the evaluation of bradycardia at the request of Dr. Manus Gunning.  History of Present Illness:   Mr. Comolli reports that for the past 2 months he has been noticing intermittent lightheadedness with standing.  Typically, he walks about 5000 steps daily but has been having trouble doing this more recently due to feeling lightheaded and weakness in his legs as if they are going to give out.  He denies any recent or preceding chest pain, shortness of breath, orthopnea, PND, palpitations.  He has been taking furosemide twice daily as well as metolazone twice weekly for several months now and has had minimal lower extremity edema which is well-managed with his compression stockings.  Of note, the patient has a long history of bradycardia with his atrial fibrillation and often has a resting heart rate in the 40s.  He does not experience any symptoms at rest and his heart rate has historically increased with exertion.  In the emergency room, he was hemodynamically stable and hypertensive with heart rates ranging from the high 30s to low 50s at rest.   Past Medical History:  Diagnosis Date   A-fib (HCC)    Allergy    Seasonal--pollen   Anticoagulation goal of INR 2 to 3    on coumadin   Arrhythmia    CAD (coronary artery disease) 1999   RCA stent   Carotid stenosis, asymptomatic    moderate RT and mild LT with carotid dopplers 10/26/11   Colon cancer (HCC) 04/2006   Stage III--T3 N1   COPD (chronic obstructive pulmonary disease) (HCC)    H/O cardiovascular stress test 01/13/2010   low risk scan, similar to 2008 study   H/O echocardiogram  05/23/2009   EF >55%, mild MR, Mild TR,    Hand foot syndrome    Hemorrhoids    Hyperlipidemia    Hypertension    Neuropathy    Permanent atrial fibrillation (HCC)    Psoriasis    Tubular adenoma of colon     Past Surgical History:  Procedure Laterality Date   ABDOMINAL AORTOGRAM W/LOWER EXTREMITY N/A 12/21/2021   Procedure: ABDOMINAL AORTOGRAM W/LOWER EXTREMITY;  Surgeon: [name], MD;  Location: [facility] INVASIVE CV LAB;  Service: Cardiovascular;  Laterality: N/A;   CARDIAC CATHETERIZATION     CLIPPING OF ATRIAL APPENDAGE  01/28/2018   Procedure: CLIPPING OF ATRIAL APPENDAGE;  Surgeon: Alleen Borne, MD;  Location: [facility] OR;  Service: Open Heart Surgery;;   COLONOSCOPY     CORONARY ARTERY BYPASS GRAFT N/A 01/28/2018   Procedure: CORONARY ARTERY BYPASS GRAFTING (CABG) X 4 USING LEFT INTERNAL MAMMARY ARTERY AND LEFT GREATER SAPHENOUS VEIN HARVESTED ENDOSCOPICALLY;  Surgeon: Alleen Borne, MD;  Location: [facility] OR;  Service: Open Heart Surgery;  Laterality: N/A;   CORONARY STENT PLACEMENT  1999   RCA tandem NIR stents   LEFT HEART CATH AND CORONARY ANGIOGRAPHY N/A 01/23/2018   Procedure: LEFT HEART CATH AND CORONARY ANGIOGRAPHY;  Surgeon: Yvonne Kendall, MD;  Location: [facility];  Service: Cardiovascular;  Laterality: N/A;   PERIPHERAL VASCULAR ATHERECTOMY Bilateral 12/21/2021  Procedure: PERIPHERAL VASCULAR ATHERECTOMY;  Surgeon: [name], MD;  Location: [facility] INVASIVE CV LAB;  Service: Cardiovascular;  Laterality: Bilateral;  common illiac   PERIPHERAL VASCULAR INTERVENTION Bilateral 12/21/2021   Procedure: PERIPHERAL VASCULAR INTERVENTION;  Surgeon: [name], MD;  Location: [facility];  Service: Cardiovascular;  Laterality: Bilateral;  Common Illiacs   RIGHT COLECTOMY  05/29/2006   Laparoscopic assisted   RIGHT HEART CATH N/A 06/14/2021   Procedure: RIGHT HEART CATH;  Surgeon: Dolores Patty, MD;  Location: [facility];  Service:  Cardiovascular;  Laterality: N/A;   TEE WITHOUT CARDIOVERSION N/A 01/28/2018   Procedure: TRANSESOPHAGEAL ECHOCARDIOGRAM (TEE);  Surgeon: Alleen Borne, MD;  Location: [facility] OR;  Service: Open Heart Surgery;  Laterality: N/A;   TONSILLECTOMY        Inpatient Medications: Scheduled Meds:  sodium chloride flush  3 mL Intravenous Q12H   Continuous Infusions:  lactated ringers     PRN Meds:   Allergies:   No Known Allergies  Social History:   Social History   Socioeconomic History   Marital status: Married    Spouse name: Lattie Corns   Number of children: 2   Years of education: Not on file   Highest education level: Bachelor's degree (e.g., BA, AB, BS)  Occupational History   Occupation: retired    Associate Professor: RETIRED  Tobacco Use   Smoking status: Former    Years: 30    Types: Cigarettes    Quit date: 11/25/1993    Years since quitting: 28.7   Smokeless tobacco: Never  Vaping Use   Vaping Use: Never used  Substance and Sexual Activity   Alcohol use: Yes    Alcohol/week: 1.0 standard drink of alcohol    Types: 1 Cans of beer per week   Drug use: No   Sexual activity: Not on file  Other Topics Concern   Not on file  Social History Narrative   Not on file   Social Determinants of Health   Financial Resource Strain: Low Risk  (04/08/2018)   Overall Financial Resource Strain (CARDIA)    Difficulty of Paying Living Expenses: Not hard at all  Food Insecurity: No Food Insecurity (12/22/2021)   Hunger Vital Sign    Worried About Running Out of Food in the Last Year: Never true    Ran Out of Food in the Last Year: Never true  Transportation Needs: No Transportation Needs (12/22/2021)   PRAPARE - Administrator, Civil Service (Medical): No    Lack of Transportation (Non-Medical): No  Physical Activity: Unknown (04/08/2018)   Exercise Vital Sign    Days of Exercise per Week: 3 days    Minutes of Exercise per Session: Not asked  Stress: Stress Concern Present  (04/08/2018)   [employer] of Occupational Health - Occupational Stress Questionnaire    Feeling of Stress : To some extent  Social Connections: Not on file  Intimate Partner Violence: Not At Risk (12/22/2021)   Humiliation, Afraid, Rape, and Kick questionnaire    Fear of Current or Ex-Partner: No    Emotionally Abused: No    Physically Abused: No    Sexually Abused: No    Family History:    Family History  Problem Relation Age of Onset   Heart disease Father 28   Heart disease Paternal Grandmother    Heart disease Mother    Hypertension Mother    Healthy Sister    Heart disease Paternal Grandfather  Hypertension Paternal Grandfather    Colon cancer Neg Hx      ROS:  Please see the history of present illness.   All other ROS reviewed and negative.     Physical Exam/Data:   Vitals:   08/25/22 2204 08/25/22 2207 08/25/22 2208 08/26/22 0013  BP:  (!) 158/47  (!) 148/57  Pulse:  (!) 42  (!) 41  Resp:  16  17  Temp:    98.1 F (36.7 C)  TempSrc:    Oral  SpO2: 97% 99%  100%  Weight:   95.4 kg   Height:   6\' 3"  (1.905 m)    No intake or output data in the 24 hours ending 08/26/22 0058    08/25/2022   10:08 PM 07/31/2022   12:14 PM 06/29/2022    3:42 PM  Last 3 Weights  Weight (lbs) 210 lb 5.1 oz 210 lb 6.4 oz 215 lb 6.4 oz  Weight (kg) 95.4 kg 95.437 kg 97.705 kg     Body mass index is 26.29 kg/m.  General:  Well nourished, well developed, in no acute distress HEENT: normal Neck: no JVD, neck veins flat Vascular: 2+ radial pulses Cardiac:  bradycardia, irregularly irregular, systolic murmur  Lungs:  clear to auscultation bilaterally, no wheezing, rhonchi or rales  Abd: soft, nontender Ext: trace LE edema w/erythema Musculoskeletal:  No deformities, BUE and BLE strength normal and equal Skin: warm and dry  Neuro:  no focal abnormalities noted Psych:  Normal affect   EKG:  The EKG and telemetry was personally reviewed and demonstrates:  slow  AF  Relevant CV Studies: Heart Monitor 1. Atrial Fibrillation occurred continuously (100% burden), ranging from 31-114 bpm (avg of 51 bpm).  2. Bundle Branch Block/IVCD was present.  3. Two runs of nonsustained Ventricular Tachycardia occurred, the run with the fastest interval lasting 8 beats with a max rate of 179 bpm, the longest lasting 8 beats with an avg rate of 112 bpm. 4. Occasional PVCs (1.7%, 16285). 5. Ventricular Bigeminy was present.   Laboratory Data:  High Sensitivity Troponin:  No results for input(s): "TROPONINIHS" in the last 720 hours.   Chemistry Recent Labs  Lab 08/25/22 2220  NA 131*  K 3.6  CL 92*  CO2 27  GLUCOSE 114*  BUN 16  CREATININE 0.90  CALCIUM 9.5  GFRNONAA >60  ANIONGAP 12    No results for input(s): "PROT", "ALBUMIN", "AST", "ALT", "ALKPHOS", "BILITOT" in the last 168 hours. Lipids No results for input(s): "CHOL", "TRIG", "HDL", "LABVLDL", "LDLCALC", "CHOLHDL" in the last 168 hours.  Hematology Recent Labs  Lab 08/25/22 2220  WBC 6.8  RBC 4.35  HGB 13.3  HCT 39.8  MCV 91.5  MCH 30.6  MCHC 33.4  RDW 14.5  PLT 161   Thyroid No results for input(s): "TSH", "FREET4" in the last 168 hours.  BNP Recent Labs  Lab 08/25/22 2220  BNP 61.9    DDimer No results for input(s): "DDIMER" in the last 168 hours.   Radiology/Studies:  CT Head Wo Contrast  Result Date: 08/25/2022 CLINICAL DATA:  Stroke-like symptoms EXAM: CT HEAD WITHOUT CONTRAST TECHNIQUE: Contiguous axial images were obtained from the base of the skull through the vertex without intravenous contrast. RADIATION DOSE REDUCTION: This exam was performed according to the departmental dose-optimization program which includes automated exposure control, adjustment of the mA and/or kV according to patient size and/or use of iterative reconstruction technique. COMPARISON:  01/25/2020 FINDINGS: Brain: No evidence of acute infarction,  hemorrhage, hydrocephalus, extra-axial collection or  mass lesion/mass effect.Chronic atrophic changes and white matter ischemic changes are noted. Vascular: No hyperdense vessel or unexpected calcification. Skull: Normal. Negative for fracture or focal lesion. Sinuses/Orbits: No acute finding. Other: None. IMPRESSION: Chronic changes without acute abnormality. Electronically Signed   By: Alcide Clever M.D.   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  Dizziness Concerning for orthostasis esp given use of diuretics.  - While orthostatics technically negative, patient had significant symptoms with standing and increase in HR from ~40 to low 50s - Hold furosemide & metolazone for now - Will check echo to evaluate for valve or RV abnormalities  Atrial Fibrillation w/slow ventricular rate Bradycardia Chronic AF history. Unclear that patient's symptoms are from bradycardia given that his bradycardia is chronic. He has existing AV node dysfunction but on close inspection of ECG, no evidence of complete or high grade block. - No urgent indication for pacing - Patient should perform walk test to assess whether heart rate responding to activity - Continue apixaban 5mg  BID  Coronary artery disease - Hx of RCA stent - Continue with aspirin, statin, plavix - Can likely dc aspirin or plavix to avoid triple therapy given patient's age and bleeding risk  HTN - Continue spironolactone  HLD - c/w statin  7.   OSA/Pulmonary hypertension - c/w CPAP  Agree with admission to Medicine   Risk Assessment/Risk Scores:    CHA2DS2-VASc Score =   4      For questions or updates, please contact [facility] Please consult [email or web address] for contact info under    Signed, Rachael Fee, MD   08/26/2022 12:58 AM

## 2022-08-26 NOTE — Progress Notes (Signed)
Progress Note  Patient Name: [name] Date of Encounter: 08/26/2022  Primary Cardiologist: Nanetta Batty, MD  Interval Summary   No chest pain or shortness of breath at rest.  Generally feels weak.  Vital Signs    Vitals:   08/26/22 0120 08/26/22 0155 08/26/22 0312 08/26/22 0454  BP: (!) 167/41 107/74 125/61 (!) 140/46  Pulse: (!) 50 (!) 43 (!) 37 (!) 42  Resp: 15 19 19 15   Temp:   98.4 F (36.9 C) 98.4 F (36.9 C)  TempSrc:   Oral Oral  SpO2: 99% 98% 100% 100%  Weight:      Height:        Intake/Output Summary (Last 24 hours) at 08/26/2022 0903 Last data filed at 08/26/2022 0153 Gross per 24 hour  Intake 500 ml  Output --  Net 500 ml   Filed Weights   08/25/22 2208  Weight: 95.4 kg    Physical Exam   GEN: No acute distress.   Neck: No JVD. Cardiac: Irregular, 1/6 systolic murmur.[name]  Respiratory: Nonlabored. Clear to auscultation bilaterally. GI: Soft, nontender, bowel sounds present. MS: Mild edema and venous stasis changes.  ECG/Telemetry    An ECG dated 08/25/2022 was personally reviewed today and demonstrated:  Slow atrial fibrillation in the 40s with right bundle branch block and left anterior fascicular block.  Telemetry reviewed showing slow atrial fibrillation in the 30s.  No pauses.  Labs    Chemistry Recent Labs  Lab 08/25/22 2220 08/26/22 0455  NA 131* 131*  K 3.6 3.3*  CL 92* 97*  CO2 27 25  GLUCOSE 114* 105*  BUN 16 15  CREATININE 0.90 0.77  CALCIUM 9.5 8.9  GFRNONAA >60 >60  ANIONGAP 12 9    Hematology Recent Labs  Lab 08/25/22 2220 08/26/22 0455  WBC 6.8 7.0  RBC 4.35 4.00*  HGB 13.3 12.6*  HCT 39.8 37.1*  MCV 91.5 92.8  MCH 30.6 31.5  MCHC 33.4 34.0  RDW 14.5 14.6  PLT 161 147*   Cardiac Enzymes Recent Labs  Lab 08/26/22 0200 08/26/22 0455  TROPONINIHS 12 18*   Lipid Panel     Component Value Date/Time   CHOL 99 12/22/2021 0144   TRIG 59 12/22/2021 0144   HDL 38 (L) 12/22/2021 0144   CHOLHDL 2.6  12/22/2021 0144   VLDL 12 12/22/2021 0144   LDLCALC 49 12/22/2021 0144    Cardiac Studies   Cardiac monitor May 2024:  1. Atrial Fibrillation occurred continuously (100% burden), ranging from 31-114 bpm (avg of 51 bpm).  2. Bundle Branch Block/IVCD was present.  3. Two runs of nonsustained Ventricular Tachycardia occurred, the run with the fastest interval lasting 8 beats with a max rate of 179 bpm, the longest lasting 8 beats with an avg rate of 112 bpm. 4. Occasional PVCs (1.7%, 16285). 5. Ventricular Bigeminy was present.   Assessment & Plan   Patient seen in ER awaiting admission.  I reviewed the consultation note from cardiology fellow, subsequent testing and telemetry.  Patient still reporting a general sense of weakness.  He states that he has felt progressively fatigued over the last few weeks, has had some mild leg swelling, sense of orthostatic dizziness but no syncope or chest pain.  Slowest heart rate on his smart watch in the high 30s, although he does mention an episode of tachycardia over 100 while he was sitting reading a book.  His outpatient heart monitor from May did show heart rate ranging from 30s  to 110s with average in the 50s.  Recent heart rate trend 30s to 40s suggesting progressive conduction system disease and most likely chronotropic incompetence at this point.  He is not on any AV nodal blockers.  Patient being admitted to the hospitalist team, will ask EP to reevaluate, he had a visit with Dr. Rolly Pancake last month.  Echocardiogram pending as well.  He remains on Eliquis for stroke prophylaxis.  CRITICAL CARE Performed by: Nona Dell   Total critical care time: 30 minutes  Critical care time was exclusive of separately billable procedures and treating other patients.  Critical care was necessary to treat or prevent imminent or life-threatening deterioration.  Critical care was time spent personally by me on the following activities: development of  treatment plan with patient and/or surrogate as well as nursing, discussions with consultants, evaluation of patient's response to treatment, examination of patient, obtaining history from patient or surrogate, ordering and performing treatments and interventions, ordering and review of laboratory studies, ordering and review of radiographic studies, pulse oximetry and re-evaluation of patient's condition.   For questions or updates, please contact [facility] Please consult [email or web address] for contact info under   Signed, Nona Dell, MD  08/26/2022, 9:03 AM

## 2022-08-26 NOTE — Progress Notes (Signed)
[facility] PROGRESS NOTE   [name] ZOX:[id number] DOB: 05-19-1940 DOA: 08/25/2022  PCP: Patient, No Pcp Per  Brief History/Interval Summary: 82 y.o. male with medical history significant for atrial fibrillation with slow ventricular response on Eliquis, pulmonary hypertension, CAD, and OSA who presented to the emergency department with lightheadedness and generalized weakness.  This has been ongoing for about 2 weeks now.  Patient was noted to be bradycardic.  Patient was seen by cardiology.  Patient was hospitalized for further management.  Consultants: Cardiology  Procedures: Echocardiogram is pending    Subjective/Interval History: Patient denies any chest pain shortness of breath.  Denies any dizziness or lightheadedness.  He does have numbness in both his feet.  But denies any weakness.    Assessment/Plan:  Near syncope Possibly secondary to orthostatic hypotension.  He was on diuretics which could have contributed to.  Diuretics were placed on hold.  Patient was given IV fluid bolus. Seems to be stable currently.  Await echocardiogram. MRI brain without any acute findings.  MRI lumbar spine also without acute findings.  Atrial fibrillation with slow ventricular response Heart rate noted to be in the 30s.  Asymptomatic currently.  Seen by cardiology.  They are consulting electrophysiology. Eliquis being continued.  Patient also noted to be on aspirin and Plavix for his peripheral artery disease.  Peripheral artery disease Followed by Dr. Allyson Sabal for the same.  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  Continue with aspirin and Plavix.  Both his feet are cold to touch left more than right.  Pulses are palpable though weak.  Patient denies any pain.  No loss of strength. He has ABIs and vascular ultrasound of the IVC and iliacs were unchanged when checked in April of this year.  Coronary  artery disease Stable.  Continue statin.  Obstructive sleep apnea BiPAP nightly.  Hyponatremia/hypokalemia Stable.  Potassium will be repleted  Pulmonary hypertension Holding diuretics.  DVT Prophylaxis: Eliquis Code Status: Full code Family Communication: Discussed with patient Disposition Plan: Hopefully return home when improved  Status is: Observation The patient will require care spanning > 2 midnights and should be moved to inpatient because: Near syncope, slow atrial fibrillation      Medications: Scheduled:  apixaban  5 mg Oral BID   aspirin  81 mg Oral Daily   atorvastatin  80 mg Oral Daily   clopidogrel  75 mg Oral Q breakfast   potassium chloride  40 mEq Oral BID   sodium chloride flush  3 mL Intravenous Q12H   sodium chloride flush  3 mL Intravenous Q12H   Continuous: [id number]:[id number] **OR** acetaminophen, ondansetron **OR** ondansetron (ZOFRAN) IV, senna-docusate  Antibiotics: Anti-infectives (From admission, onward)    None       Objective:  Vital Signs  Vitals:   08/26/22 0454 08/26/22 0700 08/26/22 0904 08/26/22 0907  BP: (!) 140/46 (!) 150/52 (!) 131/48   Pulse: (!) 42 (!) 34 (!) 34   Resp: 15 18  13   Temp: 98.4 F (36.9 C)  (!) 97.5 F (36.4 C)   TempSrc: Oral  Oral   SpO2: 100% 100% 100%   Weight:      Height:        Intake/Output Summary (Last 24 hours) at 08/26/2022 0955 Last data filed at 08/26/2022 0905 Gross per 24 hour  Intake 500 ml  Output 600 ml  Net -100 ml   [employer]   08/25/22 2208  Weight: 95.4 kg    General appearance: Awake alert.  In no distress Resp: Clear to auscultation bilaterally.  Normal effort Cardio: S1-S2 is normal regular.  No S3-S4.  No rubs murmurs or bruit GI: Abdomen is soft.  Nontender nondistended.  Bowel sounds are present normal.  No masses organomegaly Extremities: Chronic skin changes of both lower extremity.  They are cold to touch.  Pulses are palpable left greater than  right. Neurologic: Alert and oriented x3.  No focal neurological deficits.    Lab Results:  Data Reviewed: I have personally reviewed following labs and reports of the imaging studies  CBC: Recent Labs  Lab 08/25/22 2220 08/26/22 0455  WBC 6.8 7.0  HGB 13.3 12.6*  HCT 39.8 37.1*  MCV 91.5 92.8  PLT 161 147*    Basic Metabolic Panel: Recent Labs  Lab 08/25/22 2220 08/26/22 0455  NA 131* 131*  K 3.6 3.3*  CL 92* 97*  CO2 27 25  GLUCOSE 114* 105*  BUN 16 15  CREATININE 0.90 0.77  CALCIUM 9.5 8.9  MG  --  2.2    GFR: Estimated Creatinine Clearance: 89.5 mL/min (by C-G formula based on SCr of 0.77 mg/dL).   Coagulation Profile: Recent Labs  Lab 08/25/22 2220  INR 1.3*     CBG: Recent Labs  Lab 08/25/22 2219  GLUCAP 161*     Thyroid Function Tests: Recent Labs    08/26/22 0455  TSH 2.798    Radiology Studies: MR LUMBAR SPINE WO CONTRAST  Result Date: 08/26/2022 CLINICAL DATA:  Initial evaluation for acute myelopathy. EXAM: MRI LUMBAR SPINE WITHOUT CONTRAST TECHNIQUE: Multiplanar, multisequence MR imaging of the lumbar spine was performed. No intravenous contrast was administered. COMPARISON:  None Available. FINDINGS: Segmentation: Standard. Lowest well-formed disc space labeled the L5-S1 level. Alignment: Physiologic with preservation of the normal lumbar lordosis. No listhesis. Vertebrae: Vertebral body height maintained without acute or chronic fracture. Bone marrow signal intensity within normal limits. No worrisome osseous lesions. Degenerative endplate Schmorl's node deformity with prominent reactive marrow edema present at the inferior endplate of L3. Additional mild reactive endplate changes noted about the L4-5 interspace. Conus medullaris and cauda equina: Conus extends to the T12 level. Conus and cauda equina appear normal. Paraspinal and other soft tissues: Mild edema within the posterior paraspinous soft tissues at the levels of L2-3 and L3-4,  which could be reactive in nature due to facet disease or possibly reflect mild muscular injury/strain. No other acute finding. Disc levels: T12-L1: Disc desiccation without disc bulge. Mild facet spurring. No canal or foraminal stenosis. L1-2: Disc desiccation with mild circumferential disc bulge. Mild facet hypertrophy. No spinal stenosis. Foramina remain patent. L2-3: Mild disc bulge. Mild facet and ligament flavum hypertrophy. No spinal stenosis. Mild bilateral L2 foraminal narrowing. L3-4: Disc desiccation with mild disc bulge. Prominent endplate Schmorl's node deformity at the inferior endplate of L3 with associated reactive marrow edema. Associated reactive endplate change with marginal endplate osteophytic spurring, greater on the left. Mild facet and ligament flavum hypertrophy. No significant spinal stenosis. Moderate bilateral L3 foraminal narrowing. L4-5: Disc desiccation with diffuse disc bulge, slightly asymmetric to the left. Associated annular fissure at the level of the left neural foramen. Mild to moderate facet hypertrophy, greater on the left. Resultant mild narrowing of the left lateral recess. Central canal remains patent. Moderate left worse than right L4 foraminal stenosis. L5-S1: Endplate osteophytic spurring without significant disc bulge. Mild facet hypertrophy. No canal or foraminal stenosis. IMPRESSION: 1. Normal MRI  appearance of the conus and cauda equina. 2. Mild edema within the posterior paraspinous soft tissues at the levels of L2-3 and L3-4, which could be reactive in nature due to facet disease or possibly reflect mild muscular injury/strain. 3. Multifactorial degenerative changes at L3-4 and L4-5 with resultant moderate bilateral L3 and L4 foraminal stenosis. 4. Degenerative endplate Schmorl's node deformity with associated reactive marrow edema at the inferior endplate of L3. Finding could serve as a source for lower back pain. Electronically Signed   By: Rise Mu  M.D.   On: 08/26/2022 03:56   MR BRAIN WO CONTRAST  Result Date: 08/26/2022 CLINICAL DATA:  Initial evaluation for neuro deficit, stroke. EXAM: MRI HEAD WITHOUT CONTRAST TECHNIQUE: Multiplanar, multiecho pulse sequences of the brain and surrounding structures were obtained without intravenous contrast. COMPARISON:  Prior CT from 08/25/2022. FINDINGS: Brain: Mild age-related cerebral atrophy. Patchy T2/FLAIR hyperintensity involving the periventricular deep white matter both cerebral hemispheres, consistent with chronic small vessel ischemic disease, mild to moderate in nature. No evidence for acute or subacute ischemia. Gray-white matter differentiation maintained. Or is a chronic cortical infarction. No acute or chronic intracranial blood products. No mass lesion, mass effect or midline shift. No hydrocephalus or extra-axial fluid collection. Pituitary gland and suprasellar region within normal limits. Vascular: Major intracranial vascular flow voids are maintained. Skull and upper cervical spine: Craniocervical junction within normal limits. Bone marrow signal intensity normal. 1 cm nodular lesion present at the posterior scalp vertex, indeterminate (series 10, image 27). Sinuses/Orbits: Prior bilateral ocular lens replacement. Paranasal sinuses are clear. Trace left mastoid effusion, of doubtful significance. Other: None. IMPRESSION: 1. No acute intracranial abnormality. 2. Age-related cerebral atrophy with mild to moderate chronic microvascular ischemic disease. 3. 1 cm nodular lesion at the posterior scalp vertex, indeterminate. Correlation with physical exam recommended. Electronically Signed   By: Rise Mu M.D.   On: 08/26/2022 03:47   DG Chest Portable 1 View  Result Date: 08/26/2022 CLINICAL DATA:  Shortness of breath, dizziness.  Fall 3 weeks ago. EXAM: PORTABLE CHEST 1 VIEW COMPARISON:  07/06/2021. FINDINGS: The heart is enlarged and the mediastinal contour is stable. There is  atherosclerotic calcification of the aorta. Sternotomy wires and a left atrial appendage clip are noted. No consolidation, effusion, or pneumothorax. No acute osseous abnormality. IMPRESSION: Cardiomegaly with no active disease. Electronically Signed   By: Thornell Sartorius M.D.   On: 08/26/2022 02:37   CT Head Wo Contrast  Result Date: 08/25/2022 CLINICAL DATA:  Stroke-like symptoms EXAM: CT HEAD WITHOUT CONTRAST TECHNIQUE: Contiguous axial images were obtained from the base of the skull through the vertex without intravenous contrast. RADIATION DOSE REDUCTION: This exam was performed according to the departmental dose-optimization program which includes automated exposure control, adjustment of the mA and/or kV according to patient size and/or use of iterative reconstruction technique. COMPARISON:  01/25/2020 FINDINGS: Brain: No evidence of acute infarction, hemorrhage, hydrocephalus, extra-axial collection or mass lesion/mass effect.Chronic atrophic changes and white matter ischemic changes are noted. Vascular: No hyperdense vessel or unexpected calcification. Skull: Normal. Negative for fracture or focal lesion. Sinuses/Orbits: No acute finding. Other: None. IMPRESSION: Chronic changes without acute abnormality. Electronically Signed   By: Alcide Clever M.D.   On: 08/25/2022 22:51       LOS: 0 days   Terita Hejl Rito Ehrlich  [facility] Pager on [email or web address]  08/26/2022, 9:55 AM

## 2022-08-26 NOTE — ED Notes (Signed)
ED TO INPATIENT HANDOFF REPORT  ED Nurse Name and Phone #: Rulon Eisenmenger Name/Age/Gender [name] 82 y.o. male Room/Bed: 036C/036C  Code Status   Code Status: Full Code  Home/SNF/Other Home Patient oriented to: self, place, time, and situation Is this baseline? Yes   Triage Complete: Triage complete  Chief Complaint Near syncope [R55]  Triage Note Pt with c/o worsening dizziness over the last 2 weeks. Getting worse since fall 3 weeks ago where he hit his head and is on eliquis and plavix. Pt was not seen for fall. Today symptoms were worse. Stroke screen with ems NEG. Denies cp sob. Hr 40 but that is baseline at rest. Pt is in afib and has history of stents.   Allergies No Known Allergies  Level of Care/Admitting Diagnosis ED Disposition     ED Disposition  Admit   Condition  --   Comment  Hospital Area: [facility] [[id number]]  Level of Care: Telemetry Cardiac [[id number]]  May place patient in observation at [facility] or [facility] if equivalent level of care is available:: No  Covid Evaluation: Asymptomatic - no recent exposure (last 10 days) testing not required  Diagnosis: Near syncope [phone]  Admitting Physician: Briscoe Deutscher [[id number]]  Attending Physician: Briscoe Deutscher [[id number]]          B Medical/Surgery History Past Medical History:  Diagnosis Date   A-fib (HCC)    Allergy    Seasonal--pollen   Arrhythmia    CAD (coronary artery disease) 1999   RCA stent   Carotid stenosis, asymptomatic    moderate RT and mild LT with carotid dopplers 10/26/11   Colon cancer (HCC) 04/2006   Stage III--T3 N1   COPD (chronic obstructive pulmonary disease) (HCC)    H/O cardiovascular stress test 01/13/2010   low risk scan, similar to 2008 study   H/O echocardiogram 05/23/2009   EF >55%, mild MR, Mild TR,    Hand foot syndrome    Hemorrhoids    Hyperlipidemia    Hypertension    Neuropathy    Permanent atrial fibrillation (HCC)     Psoriasis    Tubular adenoma of colon    Past Surgical History:  Procedure Laterality Date   ABDOMINAL AORTOGRAM W/LOWER EXTREMITY N/A 12/21/2021   Procedure: ABDOMINAL AORTOGRAM W/LOWER EXTREMITY;  Surgeon: [name], MD;  Location: [facility] INVASIVE CV LAB;  Service: Cardiovascular;  Laterality: N/A;   CARDIAC CATHETERIZATION     CLIPPING OF ATRIAL APPENDAGE  01/28/2018   Procedure: CLIPPING OF ATRIAL APPENDAGE;  Surgeon: Alleen Borne, MD;  Location: [facility] OR;  Service: Open Heart Surgery;;   COLONOSCOPY     CORONARY ARTERY BYPASS GRAFT N/A 01/28/2018   Procedure: CORONARY ARTERY BYPASS GRAFTING (CABG) X 4 USING LEFT INTERNAL MAMMARY ARTERY AND LEFT GREATER SAPHENOUS VEIN HARVESTED ENDOSCOPICALLY;  Surgeon: Alleen Borne, MD;  Location: [facility] OR;  Service: Open Heart Surgery;  Laterality: N/A;   CORONARY STENT PLACEMENT  1999   RCA tandem NIR stents   LEFT HEART CATH AND CORONARY ANGIOGRAPHY N/A 01/23/2018   Procedure: LEFT HEART CATH AND CORONARY ANGIOGRAPHY;  Surgeon: Yvonne Kendall, MD;  Location: [facility] INVASIVE CV LAB;  Service: Cardiovascular;  Laterality: N/A;   PERIPHERAL VASCULAR ATHERECTOMY Bilateral 12/21/2021   Procedure: PERIPHERAL VASCULAR ATHERECTOMY;  Surgeon: [name], MD;  Location: [facility] INVASIVE CV LAB;  Service: Cardiovascular;  Laterality: Bilateral;  common illiac   PERIPHERAL VASCULAR INTERVENTION Bilateral 12/21/2021  Procedure: PERIPHERAL VASCULAR INTERVENTION;  Surgeon: [name], MD;  [facility] INVASIVE CV LAB;  Service: Cardiovascular;  Laterality: Bilateral;  Common Illiacs   RIGHT COLECTOMY  05/29/2006   Laparoscopic assisted   RIGHT HEART CATH N/A 06/14/2021   Procedure: RIGHT HEART CATH;  Surgeon: Dolores Patty, MD;  Location: [facility] INVASIVE CV LAB;  Service: Cardiovascular;  Laterality: N/A;   TEE WITHOUT CARDIOVERSION N/A 01/28/2018   Procedure: TRANSESOPHAGEAL ECHOCARDIOGRAM (TEE);  Surgeon: Alleen Borne, MD;  Location: [facility] OR;  Service:  Open Heart Surgery;  Laterality: N/A;   TONSILLECTOMY       A IV Location/Drains/Wounds Patient Lines/Drains/Airways Status     Active Line/Drains/Airways     Name Placement date Placement time Site Days   Peripheral IV 08/26/22 20 G Left Antecubital 08/26/22  0108  Antecubital  less than 1            Intake/Output Last 24 hours  Intake/Output Summary (Last 24 hours) at 08/26/2022 1608 Last data filed at 08/26/2022 1607 Gross per 24 hour  Intake 500 ml  Output 1100 ml  Net -600 ml    Labs/Imaging Results for orders placed or performed during the hospital encounter of 08/25/22 (from the past 48 hour(s))  Urinalysis, Routine w reflex microscopic -Urine, Clean Catch     Status: None   Collection Time: 08/25/22 10:19 PM  Result Value Ref Range   Color, Urine YELLOW YELLOW   APPearance CLEAR CLEAR   Specific Gravity, Urine 1.008 1.005 - 1.030   pH 7.0 5.0 - 8.0   Glucose, UA NEGATIVE NEGATIVE mg/dL   Hgb urine dipstick NEGATIVE NEGATIVE   Bilirubin Urine NEGATIVE NEGATIVE   Ketones, ur NEGATIVE NEGATIVE mg/dL   Protein, ur NEGATIVE NEGATIVE mg/dL   Nitrite NEGATIVE NEGATIVE   Leukocytes,Ua NEGATIVE NEGATIVE    Comment: Performed at [facility], [address], [place], [place] [address]  CBG monitoring, ED     Status: Abnormal   Collection Time: 08/25/22 10:19 PM  Result Value Ref Range   Glucose-Capillary 161 (H) 70 - 99 mg/dL    Comment: Glucose reference range applies only to samples taken after fasting for at least 8 hours.  Basic metabolic panel     Status: Abnormal   Collection Time: 08/25/22 10:20 PM  Result Value Ref Range   Sodium 131 (L) 135 - 145 mmol/L   Potassium 3.6 3.5 - 5.1 mmol/L   Chloride 92 (L) 98 - 111 mmol/L   CO2 27 22 - 32 mmol/L   Glucose, Bld 114 (H) 70 - 99 mg/dL    Comment: Glucose reference range applies only to samples taken after fasting for at least 8 hours.   BUN 16 8 - 23 mg/dL   Creatinine, Ser 6.04 0.61 - 1.24 mg/dL    Calcium 9.5 8.9 - 54.0 mg/dL   GFR, Estimated >98 >11 mL/min    Comment: (NOTE) Calculated using the CKD-EPI Creatinine Equation (2021)    Anion gap 12 5 - 15    Comment: Performed at [facility], [address], [place], [place] [address]  CBC     Status: None   Collection Time: 08/25/22 10:20 PM  Result Value Ref Range   WBC 6.8 4.0 - 10.5 K/uL   RBC 4.35 4.22 - 5.81 MIL/uL   Hemoglobin 13.3 13.0 - 17.0 g/dL   HCT 29.5 62.1 - 30.8 %   MCV 91.5 80.0 - 100.0 fL   MCH 30.6  26.0 - 34.0 pg   MCHC 33.4 30.0 - 36.0 g/dL   RDW 16.1 09.6 - 04.5 %   Platelets 161 150 - 400 K/uL   nRBC 0.0 0.0 - 0.2 %    Comment: Performed at [facility], [address] [place], [place] [address]  Brain natriuretic peptide     Status: None   Collection Time: 08/25/22 10:20 PM  Result Value Ref Range   B Natriuretic Peptide 61.9 0.0 - 100.0 pg/mL    Comment: Performed at [facility], [address], [place], [place] [address]  Protime-INR     Status: Abnormal   Collection Time: 08/25/22 10:20 PM  Result Value Ref Range   Prothrombin Time 16.1 (H) 11.4 - 15.2 seconds   INR 1.3 (H) 0.8 - 1.2    Comment: (NOTE) INR goal varies based on device and disease states. Performed at [facility], [address], [place], [place] [address]   Troponin I (High Sensitivity)     Status: None   Collection Time: 08/26/22  2:00 AM  Result Value Ref Range   Troponin I (High Sensitivity) 12 <18 ng/L    Comment: (NOTE) Elevated high sensitivity troponin I (hsTnI) values and significant  changes across serial measurements may suggest ACS but many other  chronic and acute conditions are known to elevate hsTnI results.  Refer to the "Links" section for chest pain algorithms and additional  guidance. Performed at [facility], [address] [place], [place] [address]   Basic metabolic panel     Status: Abnormal   Collection Time: 08/26/22  4:55 AM  Result Value Ref Range   Sodium  131 (L) 135 - 145 mmol/L   Potassium 3.3 (L) 3.5 - 5.1 mmol/L   Chloride 97 (L) 98 - 111 mmol/L   CO2 25 22 - 32 mmol/L   Glucose, Bld 105 (H) 70 - 99 mg/dL    Comment: Glucose reference range applies only to samples taken after fasting for at least 8 hours.   BUN 15 8 - 23 mg/dL   Creatinine, Ser 6.57 0.61 - 1.24 mg/dL   Calcium 8.9 8.9 - 84.6 mg/dL   GFR, Estimated >96 >29 mL/min    Comment: (NOTE) Calculated using the CKD-EPI Creatinine Equation (2021)    Anion gap 9 5 - 15    Comment: Performed at [facility], [address], [place], [place] [address]  CBC     Status: Abnormal   Collection Time: 08/26/22  4:55 AM  Result Value Ref Range   WBC 7.0 4.0 - 10.5 K/uL   RBC 4.00 (L) 4.22 - 5.81 MIL/uL   Hemoglobin 12.6 (L) 13.0 - 17.0 g/dL   HCT 32.4 (L) 40.1 - 02.7 %   MCV 92.8 80.0 - 100.0 fL   MCH 31.5 26.0 - 34.0 pg   MCHC 34.0 30.0 - 36.0 g/dL   RDW 25.3 66.4 - 40.3 %   Platelets 147 (L) 150 - 400 K/uL   nRBC 0.0 0.0 - 0.2 %    Comment: Performed at [facility], [address] [place], [place] [address]  Magnesium     Status: None   Collection Time: 08/26/22  4:55 AM  Result Value Ref Range   Magnesium 2.2 1.7 - 2.4 mg/dL    Comment: Performed at [facility], [address], [place], [place] [address]  Troponin I (High Sensitivity)  Status: Abnormal   Collection Time: 08/26/22  4:55 AM  Result Value Ref Range   Troponin I (High Sensitivity) 18 (H) <18 ng/L    Comment: (NOTE) Elevated high sensitivity troponin I (hsTnI) values and significant  changes across serial measurements may suggest ACS but many other  chronic and acute conditions are known to elevate hsTnI results.  Refer to the "Links" section for chest pain algorithms and additional  guidance. Performed at [facility], [address], [place], [place] [address]   TSH     Status: None   Collection Time: 08/26/22  4:55 AM  Result Value Ref Range   TSH 2.798 0.350 - 4.500  uIU/mL    Comment: Performed by a 3rd Generation assay with a functional sensitivity of <=0.01 uIU/mL. Performed at [facility], [address], [place], [place] [address]    MR LUMBAR SPINE WO CONTRAST  Result Date: 08/26/2022 CLINICAL DATA:  Initial evaluation for acute myelopathy. EXAM: MRI LUMBAR SPINE WITHOUT CONTRAST TECHNIQUE: Multiplanar, multisequence MR imaging of the lumbar spine was performed. No intravenous contrast was administered. COMPARISON:  None Available. FINDINGS: Segmentation: Standard. Lowest well-formed disc space labeled the L5-S1 level. Alignment: Physiologic with preservation of the normal lumbar lordosis. No listhesis. Vertebrae: Vertebral body height maintained without acute or chronic fracture. Bone marrow signal intensity within normal limits. No worrisome osseous lesions. Degenerative endplate Schmorl's node deformity with prominent reactive marrow edema present at the inferior endplate of L3. Additional mild reactive endplate changes noted about the L4-5 interspace. Conus medullaris and cauda equina: Conus extends to the T12 level. Conus and cauda equina appear normal. Paraspinal and other soft tissues: Mild edema within the posterior paraspinous soft tissues at the levels of L2-3 and L3-4, which could be reactive in nature due to facet disease or possibly reflect mild muscular injury/strain. No other acute finding. Disc levels: T12-L1: Disc desiccation without disc bulge. Mild facet spurring. No canal or foraminal stenosis. L1-2: Disc desiccation with mild circumferential disc bulge. Mild facet hypertrophy. No spinal stenosis. Foramina remain patent. L2-3: Mild disc bulge. Mild facet and ligament flavum hypertrophy. No spinal stenosis. Mild bilateral L2 foraminal narrowing. L3-4: Disc desiccation with mild disc bulge. Prominent endplate Schmorl's node deformity at the inferior endplate of L3 with associated reactive marrow edema. Associated reactive endplate change with  marginal endplate osteophytic spurring, greater on the left. Mild facet and ligament flavum hypertrophy. No significant spinal stenosis. Moderate bilateral L3 foraminal narrowing. L4-5: Disc desiccation with diffuse disc bulge, slightly asymmetric to the left. Associated annular fissure at the level of the left neural foramen. Mild to moderate facet hypertrophy, greater on the left. Resultant mild narrowing of the left lateral recess. Central canal remains patent. Moderate left worse than right L4 foraminal stenosis. L5-S1: Endplate osteophytic spurring without significant disc bulge. Mild facet hypertrophy. No canal or foraminal stenosis. IMPRESSION: 1. Normal MRI appearance of the conus and cauda equina. 2. Mild edema within the posterior paraspinous soft tissues at the levels of L2-3 and L3-4, which could be reactive in nature due to facet disease or possibly reflect mild muscular injury/strain. 3. Multifactorial degenerative changes at L3-4 and L4-5 with resultant moderate bilateral L3 and L4 foraminal stenosis. 4. Degenerative endplate Schmorl's node deformity with associated reactive marrow edema at the inferior endplate of L3. Finding could serve as a source for lower back pain. Electronically Signed   By: Rise Mu M.D.   On: 08/26/2022 03:56   MR BRAIN WO CONTRAST  Result Date: 08/26/2022 CLINICAL DATA:  Initial evaluation for neuro deficit, stroke. EXAM: MRI HEAD WITHOUT CONTRAST TECHNIQUE: Multiplanar, multiecho pulse sequences of the brain and surrounding structures were obtained without intravenous contrast. COMPARISON:  Prior CT from 08/25/2022. FINDINGS: Brain: Mild age-related cerebral atrophy. Patchy T2/FLAIR hyperintensity involving the periventricular deep white matter both cerebral hemispheres, consistent with chronic small vessel ischemic disease, mild to moderate in nature. No evidence for acute or subacute ischemia. Gray-white matter differentiation maintained. Or is a chronic  cortical infarction. No acute or chronic intracranial blood products. No mass lesion, mass effect or midline shift. No hydrocephalus or extra-axial fluid collection. Pituitary gland and suprasellar region within normal limits. Vascular: Major intracranial vascular flow voids are maintained. Skull and upper cervical spine: Craniocervical junction within normal limits. Bone marrow signal intensity normal. 1 cm nodular lesion present at the posterior scalp vertex, indeterminate (series 10, image 27). Sinuses/Orbits: Prior bilateral ocular lens replacement. Paranasal sinuses are clear. Trace left mastoid effusion, of doubtful significance. Other: None. IMPRESSION: 1. No acute intracranial abnormality. 2. Age-related cerebral atrophy with mild to moderate chronic microvascular ischemic disease. 3. 1 cm nodular lesion at the posterior scalp vertex, indeterminate. Correlation with physical exam recommended. Electronically Signed   By: Rise Mu M.D.   On: 08/26/2022 03:47   DG Chest Portable 1 View  Result Date: 08/26/2022 CLINICAL DATA:  Shortness of breath, dizziness.  Fall 3 weeks ago. EXAM: PORTABLE CHEST 1 VIEW COMPARISON:  07/06/2021. FINDINGS: The heart is enlarged and the mediastinal contour is stable. There is atherosclerotic calcification of the aorta. Sternotomy wires and a left atrial appendage clip are noted. No consolidation, effusion, or pneumothorax. No acute osseous abnormality. IMPRESSION: Cardiomegaly with no active disease. Electronically Signed   By: Thornell Sartorius M.D.   On: 08/26/2022 02:37   CT Head Wo Contrast  Result Date: 08/25/2022 CLINICAL DATA:  Stroke-like symptoms EXAM: CT HEAD WITHOUT CONTRAST TECHNIQUE: Contiguous axial images were obtained from the base of the skull through the vertex without intravenous contrast. RADIATION DOSE REDUCTION: This exam was performed according to the departmental dose-optimization program which includes automated exposure control, adjustment  of the mA and/or kV according to patient size and/or use of iterative reconstruction technique. COMPARISON:  01/25/2020 FINDINGS: Brain: No evidence of acute infarction, hemorrhage, hydrocephalus, extra-axial collection or mass lesion/mass effect.Chronic atrophic changes and white matter ischemic changes are noted. Vascular: No hyperdense vessel or unexpected calcification. Skull: Normal. Negative for fracture or focal lesion. Sinuses/Orbits: No acute finding. Other: None. IMPRESSION: Chronic changes without acute abnormality. Electronically Signed   By: Alcide Clever M.D.   On: 08/25/2022 22:51    Pending Labs Unresulted Labs (From admission, onward)     Start     Ordered   08/26/22 0500  Basic metabolic panel  Daily,   R      08/26/22 0408   08/26/22 0500  CBC  Daily,   R      08/26/22 0408            Vitals/Pain Today's Vitals   08/26/22 1000 08/26/22 1400 08/26/22 1419 08/26/22 1600  BP: (!) 133/41 (!) 117/47  (!) 155/53  Pulse: (!) 34 (!) 37  (!) 43  Resp: 19 (!) 21  (!) 21  Temp:   98 F (36.7 C)   TempSrc:   Oral   SpO2: 99% 97%  98%  Weight:      Height:      PainSc:  Isolation Precautions No active isolations  Medications Medications  aspirin chewable tablet 81 mg (81 mg Oral Given 08/26/22 0906)  atorvastatin (LIPITOR) tablet 80 mg (80 mg Oral Given 08/26/22 0906)  apixaban (ELIQUIS) tablet 5 mg (5 mg Oral Given 08/26/22 0906)  clopidogrel (PLAVIX) tablet 75 mg (75 mg Oral Given 08/26/22 0906)  sodium chloride flush (NS) 0.9 % injection 3 mL (3 mLs Intravenous Given 08/26/22 0906)  acetaminophen (TYLENOL) tablet 650 mg (has no administration in time range)    Or  acetaminophen (TYLENOL) suppository 650 mg (has no administration in time range)  senna-docusate (Senokot-S) tablet 1 tablet (has no administration in time range)  ondansetron (ZOFRAN) tablet 4 mg (has no administration in time range)    Or  ondansetron (ZOFRAN) injection 4 mg (has no administration in  time range)  potassium chloride SA (KLOR-CON M) CR tablet 40 mEq (40 mEq Oral Given 08/26/22 0906)  lactated ringers bolus 500 mL (0 mLs Intravenous Stopped 08/26/22 0153)  potassium chloride SA (KLOR-CON M) CR tablet 20 mEq (20 mEq Oral Given 08/26/22 0451)    Mobility walks with person assist     Focused Assessments Cardiac Assessment Handoff:  Cardiac Rhythm: Sinus bradycardia Lab Results  Component Value Date   TROPONINI 0.46 (HH) 01/22/2018   Lab Results  Component Value Date   DDIMER 0.28 01/21/2018   Does the Patient currently have chest pain? No    R Recommendations: See Admitting Provider Note  Report given to:   Additional Notes: Pt has had generalized weakness and increased falls, HR has been as low as 33, AOX4, continent to the urinal, meds whole, great historian, able to verbalize needs

## 2022-08-27 ENCOUNTER — Observation Stay (HOSPITAL_COMMUNITY): Payer: Medicare Other

## 2022-08-27 DIAGNOSIS — E871 Hypo-osmolality and hyponatremia: Secondary | ICD-10-CM | POA: Diagnosis present

## 2022-08-27 DIAGNOSIS — R001 Bradycardia, unspecified: Secondary | ICD-10-CM | POA: Diagnosis present

## 2022-08-27 DIAGNOSIS — R55 Syncope and collapse: Secondary | ICD-10-CM | POA: Diagnosis not present

## 2022-08-27 DIAGNOSIS — Z6826 Body mass index (BMI) 26.0-26.9, adult: Secondary | ICD-10-CM | POA: Diagnosis not present

## 2022-08-27 DIAGNOSIS — J301 Allergic rhinitis due to pollen: Secondary | ICD-10-CM | POA: Diagnosis present

## 2022-08-27 DIAGNOSIS — Z955 Presence of coronary angioplasty implant and graft: Secondary | ICD-10-CM | POA: Diagnosis not present

## 2022-08-27 DIAGNOSIS — I10 Essential (primary) hypertension: Secondary | ICD-10-CM | POA: Diagnosis present

## 2022-08-27 DIAGNOSIS — G4733 Obstructive sleep apnea (adult) (pediatric): Secondary | ICD-10-CM | POA: Diagnosis present

## 2022-08-27 DIAGNOSIS — Z9049 Acquired absence of other specified parts of digestive tract: Secondary | ICD-10-CM | POA: Diagnosis not present

## 2022-08-27 DIAGNOSIS — E860 Dehydration: Secondary | ICD-10-CM | POA: Diagnosis present

## 2022-08-27 DIAGNOSIS — I951 Orthostatic hypotension: Secondary | ICD-10-CM | POA: Diagnosis present

## 2022-08-27 DIAGNOSIS — I444 Left anterior fascicular block: Secondary | ICD-10-CM | POA: Diagnosis present

## 2022-08-27 DIAGNOSIS — I4510 Unspecified right bundle-branch block: Secondary | ICD-10-CM | POA: Diagnosis present

## 2022-08-27 DIAGNOSIS — G629 Polyneuropathy, unspecified: Secondary | ICD-10-CM | POA: Diagnosis present

## 2022-08-27 DIAGNOSIS — E669 Obesity, unspecified: Secondary | ICD-10-CM | POA: Diagnosis present

## 2022-08-27 DIAGNOSIS — I2510 Atherosclerotic heart disease of native coronary artery without angina pectoris: Secondary | ICD-10-CM | POA: Diagnosis present

## 2022-08-27 DIAGNOSIS — R296 Repeated falls: Secondary | ICD-10-CM | POA: Diagnosis present

## 2022-08-27 DIAGNOSIS — I4821 Permanent atrial fibrillation: Secondary | ICD-10-CM | POA: Diagnosis present

## 2022-08-27 DIAGNOSIS — I739 Peripheral vascular disease, unspecified: Secondary | ICD-10-CM | POA: Diagnosis present

## 2022-08-27 DIAGNOSIS — I482 Chronic atrial fibrillation, unspecified: Secondary | ICD-10-CM

## 2022-08-27 DIAGNOSIS — E861 Hypovolemia: Secondary | ICD-10-CM | POA: Diagnosis present

## 2022-08-27 DIAGNOSIS — I272 Pulmonary hypertension, unspecified: Secondary | ICD-10-CM | POA: Diagnosis present

## 2022-08-27 DIAGNOSIS — Z951 Presence of aortocoronary bypass graft: Secondary | ICD-10-CM | POA: Diagnosis not present

## 2022-08-27 DIAGNOSIS — E785 Hyperlipidemia, unspecified: Secondary | ICD-10-CM | POA: Diagnosis present

## 2022-08-27 DIAGNOSIS — J449 Chronic obstructive pulmonary disease, unspecified: Secondary | ICD-10-CM | POA: Diagnosis present

## 2022-08-27 DIAGNOSIS — E876 Hypokalemia: Secondary | ICD-10-CM | POA: Diagnosis present

## 2022-08-27 LAB — BASIC METABOLIC PANEL
Anion gap: 10 (ref 5–15)
BUN: 21 mg/dL (ref 8–23)
CO2: 21 mmol/L — ABNORMAL LOW (ref 22–32)
Calcium: 9 mg/dL (ref 8.9–10.3)
Chloride: 99 mmol/L (ref 98–111)
Creatinine, Ser: 1.05 mg/dL (ref 0.61–1.24)
GFR, Estimated: >60 mL/min (ref >60)
Glucose, Bld: 95 mg/dL (ref 70–99)
Potassium: 4.6 mmol/L (ref 3.5–5.1)
Sodium: 130 mmol/L — ABNORMAL LOW (ref 135–145)

## 2022-08-27 LAB — CBC
HCT: 39.5 % (ref 39.0–52.0)
Hemoglobin: 13 g/dL (ref 13.0–17.0)
MCH: 32.2 pg (ref 26.0–34.0)
MCHC: 32.9 g/dL (ref 30.0–36.0)
MCV: 97.8 fL (ref 80.0–100.0)
Platelets: 146 10*3/uL — ABNORMAL LOW (ref 150–400)
RBC: 4.04 MIL/uL — ABNORMAL LOW (ref 4.22–5.81)
RDW: 14.7 % (ref 11.5–15.5)
WBC: 7.4 10*3/uL (ref 4.0–10.5)
nRBC: 0 % (ref 0.0–0.2)

## 2022-08-27 LAB — ECHOCARDIOGRAM COMPLETE
Area-P 1/2: 3.27 cm2
Height: 75 in
S' Lateral: 3.2 cm
Weight: 3358.05 oz

## 2022-08-27 MED ORDER — APIXABAN 5 MG PO TABS
5.0000 mg | ORAL_TABLET | Freq: Two times a day (BID) | ORAL | Status: DC
  Administered 2022-08-27 – 2022-08-28 (×3): 5 mg via ORAL
  Filled 2022-08-27 (×3): qty 1

## 2022-08-27 MED ORDER — ASPIRIN 81 MG PO TBEC
81.0000 mg | DELAYED_RELEASE_TABLET | Freq: Every day | ORAL | Status: DC
  Administered 2022-08-27 – 2022-08-28 (×2): 81 mg via ORAL
  Filled 2022-08-27 (×2): qty 1

## 2022-08-27 MED ORDER — CLOPIDOGREL BISULFATE 75 MG PO TABS
75.0000 mg | ORAL_TABLET | Freq: Every day | ORAL | Status: DC
  Administered 2022-08-27 – 2022-08-28 (×2): 75 mg via ORAL
  Filled 2022-08-27 (×2): qty 1

## 2022-08-27 NOTE — Progress Notes (Signed)
   08/27/22 0345  Vitals  Temp 98.1 F (36.7 C)  Temp Source Oral  BP (!) 165/69  MAP (mmHg) 97  BP Location Right Arm  BP Method Automatic  Patient Position (if appropriate) Lying  Pulse Rate (!) 34  Pulse Rate Source Monitor  ECG Heart Rate (!) 47  Resp 16  Level of Consciousness  Level of Consciousness Alert  MEWS COLOR  MEWS Score Color Green  Oxygen Therapy  SpO2 100 %  O2 Device Nasal Cannula  O2 Flow Rate (L/min) 2 L/min  Patient Activity (if Appropriate) In bed  Pulse Oximetry Type Intermittent  Pain Assessment  Pain Scale 0-10  Pain Score 0  MEWS Score  MEWS Temp 0  MEWS Systolic 0  MEWS Pulse 1  MEWS RR 0  MEWS LOC 0  MEWS Score 1   Telemetry monitor tech phoned pts HR dropped to 28. Unsustained. Pt asleep during drop. Woke pt up. Asymptomatic. Above VS obtained. Notified on call provider. No new orders. Continue to monitor pt and notify if there is a change in status.

## 2022-08-27 NOTE — Progress Notes (Signed)
[facility] PROGRESS NOTE   [name] YNW:[id number] DOB: 07/19/1940 DOA: 08/25/2022  PCP: Patient, No Pcp Per  Brief History/Interval Summary: 82 y.o. male with medical history significant for atrial fibrillation with slow ventricular response on Eliquis, pulmonary hypertension, CAD, and OSA who presented to the emergency department with lightheadedness and generalized weakness.  This has been ongoing for about 2 weeks now.  Patient was noted to be bradycardic.  Patient was seen by cardiology.  Patient was hospitalized for further management.  Consultants: Cardiology  Procedures: Echocardiogram is pending    Subjective/Interval History: Patient denies any chest pain shortness of breath dizziness lightheadedness.  He was concerned that his heart rate dropped into the 20s overnight.  However he was asymptomatic during that time.    Assessment/Plan:  Near syncope Possibly secondary to orthostatic hypotension.  He was on diuretics which could have contributed to.  Diuretics were placed on hold.  Patient was given IV fluid bolus. Seems to be stable for the most part.  Echocardiogram is pending.  Cardiology is following. MRI brain without any acute findings.  MRI lumbar spine also without acute findings.  Atrial fibrillation with slow ventricular response Heart rate was noted to be in the 30s and occasionally in the 20s when asleep.  Was seen by cardiology.  Electrophysiology has been consulted pacemaker.  Eliquis being continued as well.  Patient is also on aspirin and Plavix for his peripheral artery disease.    Peripheral artery disease Followed by Dr. Allyson Sabal for the same.  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  Continue with aspirin and Plavix.  Motor lower extremities of a cold touch but no changes from before per patient.  He has ABIs and vascular ultrasound of the IVC and iliacs were unchanged  when checked in April of this year.  Coronary artery disease Stable.  Continue statin.  Obstructive sleep apnea BiPAP nightly.  Hyponatremia/hypokalemia Stable.  Potassium level has improved.  Pulmonary hypertension Holding diuretics.  Resumption will be deferred to cardiology.  DVT Prophylaxis: Eliquis Code Status: Full code Family Communication: Discussed with patient Disposition Plan: Hopefully return home when cleared by cardiology  Status is: Observation The patient will require care spanning > 2 midnights and should be moved to inpatient because: Near syncope, slow atrial fibrillation      Medications: Scheduled:  apixaban  5 mg Oral BID   aspirin EC  81 mg Oral Daily   atorvastatin  80 mg Oral Daily   clopidogrel  75 mg Oral Daily   sodium chloride flush  3 mL Intravenous Q12H   Continuous: [id number] **OR** acetaminophen, melatonin, ondansetron **OR** ondansetron (ZOFRAN) IV, senna-docusate  Antibiotics: Anti-infectives (From admission, onward)    None       Objective:  Vital Signs  Vitals:   08/27/22 0345 08/27/22 0425 08/27/22 0717 08/27/22 0816  BP: (!) 165/69  (!) 128/47 (!) 145/61  Pulse: (!) 34  (!) 47 (!) 45  Resp: 16  19 16   Temp: 98.1 F (36.7 C)  97.9 F (36.6 C) 98.1 F (36.7 C)  TempSrc: Oral  Oral Oral  SpO2: 100%  100% 100%  Weight:  95.2 kg    Height:        Intake/Output Summary (Last 24 hours) at 08/27/2022 0930 Last data filed at 08/27/2022 0829 Gross per 24 hour  Intake 340 ml  Output 1100 ml  Net -760 ml    [employer]  08/25/22 2208 08/27/22 0425  Weight: 95.4 kg 95.2 kg    General appearance: Awake alert.  In no distress Resp: Clear to auscultation bilaterally.  Normal effort Cardio: S1-S2 is bradycardic regular GI: Abdomen is soft.  Nontender nondistended.  Bowel sounds are present normal.  No masses organomegaly Extremities: No edema.  Full range of motion of lower extremities. Neurologic: Alert  and oriented x3.  No focal neurological deficits.    Lab Results:  Data Reviewed: I have personally reviewed following labs and reports of the imaging studies  CBC: Recent Labs  Lab 08/25/22 2220 08/26/22 0455 08/27/22 0058  WBC 6.8 7.0 7.4  HGB 13.3 12.6* 13.0  HCT 39.8 37.1* 39.5  MCV 91.5 92.8 97.8  PLT 161 147* 146*     Basic Metabolic Panel: Recent Labs  Lab 08/25/22 2220 08/26/22 0455 08/27/22 0058  NA 131* 131* 130*  K 3.6 3.3* 4.6  CL 92* 97* 99  CO2 27 25 21*  GLUCOSE 114* 105* 95  BUN 16 15 21   CREATININE 0.90 0.77 1.05  CALCIUM 9.5 8.9 9.0  MG  --  2.2  --      GFR: Estimated Creatinine Clearance: 68.2 mL/min (by C-G formula based on SCr of 1.05 mg/dL).   Coagulation Profile: Recent Labs  Lab 08/25/22 2220  INR 1.3*      CBG: Recent Labs  Lab 08/25/22 2219  GLUCAP 161*      Thyroid Function Tests: Recent Labs    08/26/22 0455  TSH 2.798     Radiology Studies: MR LUMBAR SPINE WO CONTRAST  Result Date: 08/26/2022 CLINICAL DATA:  Initial evaluation for acute myelopathy. EXAM: MRI LUMBAR SPINE WITHOUT CONTRAST TECHNIQUE: Multiplanar, multisequence MR imaging of the lumbar spine was performed. No intravenous contrast was administered. COMPARISON:  None Available. FINDINGS: Segmentation: Standard. Lowest well-formed disc space labeled the L5-S1 level. Alignment: Physiologic with preservation of the normal lumbar lordosis. No listhesis. Vertebrae: Vertebral body height maintained without acute or chronic fracture. Bone marrow signal intensity within normal limits. No worrisome osseous lesions. Degenerative endplate Schmorl's node deformity with prominent reactive marrow edema present at the inferior endplate of L3. Additional mild reactive endplate changes noted about the L4-5 interspace. Conus medullaris and cauda equina: Conus extends to the T12 level. Conus and cauda equina appear normal. Paraspinal and other soft tissues: Mild edema  within the posterior paraspinous soft tissues at the levels of L2-3 and L3-4, which could be reactive in nature due to facet disease or possibly reflect mild muscular injury/strain. No other acute finding. Disc levels: T12-L1: Disc desiccation without disc bulge. Mild facet spurring. No canal or foraminal stenosis. L1-2: Disc desiccation with mild circumferential disc bulge. Mild facet hypertrophy. No spinal stenosis. Foramina remain patent. L2-3: Mild disc bulge. Mild facet and ligament flavum hypertrophy. No spinal stenosis. Mild bilateral L2 foraminal narrowing. L3-4: Disc desiccation with mild disc bulge. Prominent endplate Schmorl's node deformity at the inferior endplate of L3 with associated reactive marrow edema. Associated reactive endplate change with marginal endplate osteophytic spurring, greater on the left. Mild facet and ligament flavum hypertrophy. No significant spinal stenosis. Moderate bilateral L3 foraminal narrowing. L4-5: Disc desiccation with diffuse disc bulge, slightly asymmetric to the left. Associated annular fissure at the level of the left neural foramen. Mild to moderate facet hypertrophy, greater on the left. Resultant mild narrowing of the left lateral recess. Central canal remains patent. Moderate left worse than right L4 foraminal stenosis. L5-S1: Endplate osteophytic spurring without significant disc bulge.  Mild facet hypertrophy. No canal or foraminal stenosis. IMPRESSION: 1. Normal MRI appearance of the conus and cauda equina. 2. Mild edema within the posterior paraspinous soft tissues at the levels of L2-3 and L3-4, which could be reactive in nature due to facet disease or possibly reflect mild muscular injury/strain. 3. Multifactorial degenerative changes at L3-4 and L4-5 with resultant moderate bilateral L3 and L4 foraminal stenosis. 4. Degenerative endplate Schmorl's node deformity with associated reactive marrow edema at the inferior endplate of L3. Finding could serve as a  source for lower back pain. Electronically Signed   By: Rise Mu M.D.   On: 08/26/2022 03:56   MR BRAIN WO CONTRAST  Result Date: 08/26/2022 CLINICAL DATA:  Initial evaluation for neuro deficit, stroke. EXAM: MRI HEAD WITHOUT CONTRAST TECHNIQUE: Multiplanar, multiecho pulse sequences of the brain and surrounding structures were obtained without intravenous contrast. COMPARISON:  Prior CT from 08/25/2022. FINDINGS: Brain: Mild age-related cerebral atrophy. Patchy T2/FLAIR hyperintensity involving the periventricular deep white matter both cerebral hemispheres, consistent with chronic small vessel ischemic disease, mild to moderate in nature. No evidence for acute or subacute ischemia. Gray-white matter differentiation maintained. Or is a chronic cortical infarction. No acute or chronic intracranial blood products. No mass lesion, mass effect or midline shift. No hydrocephalus or extra-axial fluid collection. Pituitary gland and suprasellar region within normal limits. Vascular: Major intracranial vascular flow voids are maintained. Skull and upper cervical spine: Craniocervical junction within normal limits. Bone marrow signal intensity normal. 1 cm nodular lesion present at the posterior scalp vertex, indeterminate (series 10, image 27). Sinuses/Orbits: Prior bilateral ocular lens replacement. Paranasal sinuses are clear. Trace left mastoid effusion, of doubtful significance. Other: None. IMPRESSION: 1. No acute intracranial abnormality. 2. Age-related cerebral atrophy with mild to moderate chronic microvascular ischemic disease. 3. 1 cm nodular lesion at the posterior scalp vertex, indeterminate. Correlation with physical exam recommended. Electronically Signed   By: Rise Mu M.D.   On: 08/26/2022 03:47   DG Chest Portable 1 View  Result Date: 08/26/2022 CLINICAL DATA:  Shortness of breath, dizziness.  Fall 3 weeks ago. EXAM: PORTABLE CHEST 1 VIEW COMPARISON:  07/06/2021. FINDINGS: The  heart is enlarged and the mediastinal contour is stable. There is atherosclerotic calcification of the aorta. Sternotomy wires and a left atrial appendage clip are noted. No consolidation, effusion, or pneumothorax. No acute osseous abnormality. IMPRESSION: Cardiomegaly with no active disease. Electronically Signed   By: Thornell Sartorius M.D.   On: 08/26/2022 02:37   CT Head Wo Contrast  Result Date: 08/25/2022 CLINICAL DATA:  Stroke-like symptoms EXAM: CT HEAD WITHOUT CONTRAST TECHNIQUE: Contiguous axial images were obtained from the base of the skull through the vertex without intravenous contrast. RADIATION DOSE REDUCTION: This exam was performed according to the departmental dose-optimization program which includes automated exposure control, adjustment of the mA and/or kV according to patient size and/or use of iterative reconstruction technique. COMPARISON:  01/25/2020 FINDINGS: Brain: No evidence of acute infarction, hemorrhage, hydrocephalus, extra-axial collection or mass lesion/mass effect.Chronic atrophic changes and white matter ischemic changes are noted. Vascular: No hyperdense vessel or unexpected calcification. Skull: Normal. Negative for fracture or focal lesion. Sinuses/Orbits: No acute finding. Other: None. IMPRESSION: Chronic changes without acute abnormality. Electronically Signed   By: Alcide Clever M.D.   On: 08/25/2022 22:51       LOS: 0 days   Elvert Cumpton Rito Ehrlich  [facility] Pager on [email or web address]  08/27/2022, 9:30 AM

## 2022-08-27 NOTE — Consult Note (Addendum)
Cardiology Consultation   Patient ID: [name] MRN: [id number]; DOB: Aug 05, 1940  Admit date: 08/25/2022 Date of Consult: 08/27/2022  PCP:  Patient, No Pcp Per   [name], MD      Patient Profile:   [name] is a 82 y.o. male with a hx of CAD (remote PCI 1999, CABG 2019), colon CA (hx of R colectomy), obesity, HTN, AFib (permanent), p.HTN/severe TR (follows with HF team), PVD (s/p bilateral iliac stenting 10/23),  who is being seen 08/27/2022 for the evaluation of bradycardia at the request of Dr. Rito Ehrlich.  History of Present Illness:   [name] was recently referred to Dr. Nelly Laurence, saw him 07/31/22 for incidentally noted slower HRs on his watch, with no reported symptoms of bradycardia, and observed rise in HR with exertion, not felt to need any medication adjustments or intervention.  He was admitted yesterday with reports of increased fatigue, , perhaps lightheaded, no CP, SOB.  Using furosemide twice daily as well as metolazone twice weekly for edema. Cardiology consulted with HRs drifting to 30's, though noted dizziness was reproduced with standing (despite negative BPs) and HR did rise from 40's> 50's just with standing Planned to hold his diuretics and check and echo.  LABS NA 131 >> 130 K+ 3.5, 3.3 > 4.6 BUN/Creat 21/10.5 Mag 2.2 HS Trop 12, 18 WBC 7.4 H/H 13/39 Plts 146 TSH 2.798  Pt seen by Dr. Nelly Laurence, reported a sense of weakness, denied lightheaded No CP, SOB  He tells me a few weeks or so ago he had an event of feeling a little lightheaded, but never has felt near syncope or had syncope. Once his legs felt so weak that he was worried he couldn't drive home/control the pedals adequately He tells me even THIS admission, he did not feel lightheaded or dizzy, his legs feel weak, like they won't hold him up He felt like he was doing better post LE interventions, but seems to have slid back as far  as his legs go, his impression is that the muscles in his legs just won't hold him up    Past Medical History:  Diagnosis Date   A-fib (HCC)    Allergy    Seasonal--pollen   Arrhythmia    CAD (coronary artery disease) 1999   RCA stent   Carotid stenosis, asymptomatic    moderate RT and mild LT with carotid dopplers 10/26/11   Colon cancer (HCC) 04/2006   Stage III--T3 N1   COPD (chronic obstructive pulmonary disease) (HCC)    H/O cardiovascular stress test 01/13/2010   low risk scan, similar to 2008 study   H/O echocardiogram 05/23/2009   EF >55%, mild MR, Mild TR,    Hand foot syndrome    Hemorrhoids    Hyperlipidemia    Hypertension    Neuropathy    Permanent atrial fibrillation (HCC)    Psoriasis    Tubular adenoma of colon     Past Surgical History:  Procedure Laterality Date   ABDOMINAL AORTOGRAM W/LOWER EXTREMITY N/A 12/21/2021   Procedure: ABDOMINAL AORTOGRAM W/LOWER EXTREMITY;  Surgeon: [name], MD;  Location: [facility];  Service: Cardiovascular;  Laterality: N/A;   CARDIAC CATHETERIZATION     CLIPPING OF ATRIAL APPENDAGE  01/28/2018   Procedure: CLIPPING OF ATRIAL APPENDAGE;  Surgeon: Alleen Borne, MD;  Location: [facility] OR;  Service: Open Heart Surgery;;   COLONOSCOPY     CORONARY ARTERY BYPASS GRAFT  N/A 01/28/2018   Procedure: CORONARY ARTERY BYPASS GRAFTING (CABG) X 4 USING LEFT INTERNAL MAMMARY ARTERY AND LEFT GREATER SAPHENOUS VEIN HARVESTED ENDOSCOPICALLY;  Surgeon: Alleen Borne, MD;  Location: [facility] OR;  Service: Open Heart Surgery;  Laterality: N/A;   CORONARY STENT PLACEMENT  1999   RCA tandem NIR stents   LEFT HEART CATH AND CORONARY ANGIOGRAPHY N/A 01/23/2018   Procedure: LEFT HEART CATH AND CORONARY ANGIOGRAPHY;  Surgeon: Yvonne Kendall, MD;  Location: [facility];  Service: Cardiovascular;  Laterality: N/A;   PERIPHERAL VASCULAR ATHERECTOMY Bilateral 12/21/2021   Procedure: PERIPHERAL VASCULAR ATHERECTOMY;  Surgeon: [name], MD;  Location: [facility];  Service: Cardiovascular;  Laterality: Bilateral;  common illiac   PERIPHERAL VASCULAR INTERVENTION Bilateral 12/21/2021   Procedure: PERIPHERAL VASCULAR INTERVENTION;  Surgeon: [name], MD;  Location: [facility];  Service: Cardiovascular;  Laterality: Bilateral;  Common Illiacs   RIGHT COLECTOMY  05/29/2006   Laparoscopic assisted   RIGHT HEART CATH N/A 06/14/2021   Procedure: RIGHT HEART CATH;  Surgeon: Dolores Patty, MD;  Location: [facility];  Service: Cardiovascular;  Laterality: N/A;   TEE WITHOUT CARDIOVERSION N/A 01/28/2018   Procedure: TRANSESOPHAGEAL ECHOCARDIOGRAM (TEE);  Surgeon: Alleen Borne, MD;  Location[facility];  Service: Open Heart Surgery;  Laterality: N/A;   TONSILLECTOMY       Home Medications:  Prior to Admission medications   Medication Sig Start Date End Date Taking? Authorizing Provider  Alpha-Lipoic Acid 600 MG TABS Take 600 mg by mouth 2 (two) times daily.    [provider]  aspirin 81 MG chewable tablet Chew 81 mg by mouth daily.    [provider]  atorvastatin (LIPITOR) 80 MG tablet TAKE 1 TABLET AT 6PM. 02/14/22   [name], MD  cholecalciferol (VITAMIN D) 1000 UNITS tablet Take 1,000 Units by mouth daily.    [provider]  clopidogrel (PLAVIX) 75 MG tablet Take 1 tablet (75 mg total) by mouth daily with breakfast. 12/22/21   Duke, Roe Rutherford, PA  Coenzyme Q10 200 MG capsule Take 200 mg by mouth daily.     [provider]  Cyanocobalamin (VITAMIN B 12 PO) Take 200 mcg by mouth daily.    [provider]  ELIQUIS 5 MG TABS tablet TAKE 1 TABLET BY MOUTH TWICE  DAILY 03/28/22   [name], MD  Flaxseed, Linseed, 1000 MG CAPS 1400  mg Orally once a day    [provider]  furosemide (LASIX) 40 MG tablet Take 1 tablet (40 mg total) by mouth in the morning AND 1 tablet (40 mg total) every evening. 07/16/22   [name], MD  Magnesium Citrate 200 MG TABS Take 400 mg by mouth daily in the afternoon.    [provider]  metolazone (ZAROXOLYN) 2.5 MG tablet Take 1 tablet (2.5 mg total) by mouth 2 (two) times a week. Every Mon and Fri 11/16/21   Bensimhon, Bevelyn Buckles, MD  Polyethyl Glycol-Propyl Glycol (SYSTANE) 0.4-0.3 % GEL ophthalmic gel Place 1 application. into both eyes every 8 (eight) hours as needed (dry eyes).    [provider]  pyridoxine (B-6) 200 MG tablet Take 200 mg by mouth daily.    [provider]  spironolactone (ALDACTONE) 25 MG tablet Take 1/2 TABLET (12.5 mg total) by mouth daily. 06/11/22   Bensimhon, Bevelyn Buckles, MD  vitamin C (ASCORBIC ACID) 500 MG tablet Take 500  mg by mouth 3 (three) times daily.     [provider]  lisinopril (PRINIVIL,ZESTRIL) 20 MG tablet Take 1 tablet (20 mg total) by mouth daily. HOLD UNTIL YOUR APPT WITH DR Cecelia Byars Patient not taking: No sig reported 02/11/18 01/25/20  Abelino Derrick, PA-C    Inpatient Medications: Scheduled Meds:  atorvastatin  80 mg Oral Daily   sodium chloride flush  3 mL Intravenous Q12H   Continuous Infusions:  PRN Meds: acetaminophen **OR** acetaminophen, melatonin, ondansetron **OR** ondansetron (ZOFRAN) IV, senna-docusate  Allergies:   No Known Allergies  Social History:   Social History   Socioeconomic History   Marital status: Married    Spouse name: Lattie Corns   Number of children: 2   Years of education: Not on file   Highest education level: Bachelor's degree (e.g., BA, AB, BS)  Occupational History   Occupation: retired    Associate Professor: RETIRED  Tobacco Use   Smoking status: Former    Years: 30    Types: Cigarettes    Quit date: 11/25/1993    Years since quitting: 28.7   Smokeless tobacco: Never  Vaping Use   Vaping Use: Never used  Substance and Sexual Activity   Alcohol use: Yes    Alcohol/week: 1.0 standard drink of alcohol    Types: 1 Cans of beer per week   Drug use: No    Sexual activity: Not on file  Other Topics Concern   Not on file  Social History Narrative   Not on file   Social Determinants of Health   Financial Resource Strain: Low Risk  (04/08/2018)   Overall Financial Resource Strain (CARDIA)    Difficulty of Paying Living Expenses: Not hard at all  Food Insecurity: No Food Insecurity (08/26/2022)   Hunger Vital Sign    Worried About Running Out of Food in the Last Year: Never true    Ran Out of Food in the Last Year: Never true  Transportation Needs: No Transportation Needs (08/26/2022)   PRAPARE - Administrator, Civil Service (Medical): No    Lack of Transportation (Non-Medical): No  Physical Activity: Unknown (04/08/2018)   Exercise Vital Sign    Days of Exercise per Week: 3 days    Minutes of Exercise per Session: Not asked  Stress: Stress Concern Present (04/08/2018)   [employer] of Occupational Health - Occupational Stress Questionnaire    Feeling of Stress : To some extent  Social Connections: Not on file  Intimate Partner Violence: Not At Risk (08/26/2022)   Humiliation, Afraid, Rape, and Kick questionnaire    Fear of Current or Ex-Partner: No    Emotionally Abused: No    Physically Abused: No    Sexually Abused: No    Family History:   Family History  Problem Relation Age of Onset   Heart disease Father 73   Heart disease Paternal Grandmother    Heart disease Mother    Hypertension Mother    Healthy Sister    Heart disease Paternal Grandfather    Hypertension Paternal Grandfather    Colon cancer Neg Hx      ROS:  Please see the history of present illness.  All other ROS reviewed and negative.     Physical Exam/Data:   Vitals:   08/27/22 0345 08/27/22 0425 08/27/22 0717 08/27/22 0816  BP: (!) 165/69  (!) 128/47 (!) 145/61  Pulse: (!) 34  (!) 47 (!) 45  Resp: 16  19 16   Temp: 98.1 F (  36.7 C)  97.9 F (36.6 C) 98.1 F (36.7 C)  TempSrc: Oral  Oral Oral  SpO2: 100%  100% 100%  Weight:  95.2 kg     Height:        Intake/Output Summary (Last 24 hours) at 08/27/2022 0832 Last data filed at 08/27/2022 0829 Gross per 24 hour  Intake 340 ml  Output 1400 ml  Net -1060 ml      08/27/2022    4:25 AM 08/25/2022   10:08 PM 07/31/2022   12:14 PM  Last 3 Weights  Weight (lbs) 209 lb 14.1 oz 210 lb 5.1 oz 210 lb 6.4 oz  Weight (kg) 95.2 kg 95.4 kg 95.437 kg     Body mass index is 26.23 kg/m.  General:  Well nourished, well developed, in no acute distress HEENT: normal Neck: no JVD Vascular: No carotid bruits; Distal pulses 2+ bilaterally Cardiac:  irreg-irreg; no murmurs, gallops or rubs Lungs:  clear to auscultation bilaterally, no wheezing, rhonchi or rales  Abd: soft, nontender, no hepatomegaly  Ext: no edema, chronic looking skin changes b/l Musculoskeletal:  No deformities, BUE and BLE strength normal and equal Skin: warm and dry  Neuro:  no focal abnormalities noted Psych:  Normal affect   EKG:  The EKG was personally reviewed and demonstrates:   AFib RBBB, LAD 46bpm  Telemetry:  Telemetry was personally reviewed and demonstrates:   AFib 40's, some 30's some 50's Initially had V bigeminy noted, this has settled   Relevant CV Studies:  April 2024 monitor 1. Atrial Fibrillation occurred continuously (100% burden), ranging from 31-114 bpm (avg of 51 bpm).  2. Bundle Branch Block/IVCD was present.  3. Two runs of nonsustained Ventricular Tachycardia occurred, the run with the fastest interval lasting 8 beats with a max rate of 179 bpm, the longest lasting 8 beats with an avg rate of 112 bpm. 4. Occasional PVCs (1.7%, 16285). 5. Ventricular Bigeminy was present.     02/06/22: c.MRI IMPRESSION: 1.  Severe RAE, moderate LAE   2.  Mild LVH normal LVEF 71% with no RWMAls   3. Normal post gadolinium images no evidence of amyloid or infiltrative disease   4.  Normal parametric measures see above   5.  Normal CO 5.1 L/min   6.  Normal RV size and function RVEF 55%    7.  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 Severe central TR RVSP (previously 38)   Laboratory Data:  High Sensitivity Troponin:   Recent Labs  Lab 08/26/22 0200 08/26/22 0455  TROPONINIHS 12 18*     Chemistry Recent Labs  Lab 08/25/22 2220 08/26/22 0455 08/27/22 0058  NA 131* 131* 130*  K 3.6 3.3* 4.6  CL 92* 97* 99  CO2 27 25 21*  GLUCOSE 114* 105* 95  BUN 16 15 21   CREATININE 0.90 0.77 1.05  CALCIUM 9.5 8.9 9.0  MG  --  2.2  --   GFRNONAA >60 >60 >60  ANIONGAP 12 9 10     No results for input(s): "PROT", "ALBUMIN", "AST", "ALT", "ALKPHOS", "BILITOT" in the last 168 hours. Lipids No results for input(s): "CHOL", "TRIG", "HDL", "LABVLDL", "LDLCALC", "  CHOLHDL" in the last 168 hours.  Hematology Recent Labs  Lab 08/25/22 2220 08/26/22 0455 08/27/22 0058  WBC 6.8 7.0 7.4  RBC 4.35 4.00* 4.04*  HGB 13.3 12.6* 13.0  HCT 39.8 37.1* 39.5  MCV 91.5 92.8 97.8  MCH 30.6 31.5 32.2  MCHC 33.4 34.0 32.9  RDW 14.5 14.6 14.7  PLT 161 147* 146*   Thyroid  Recent Labs  Lab 08/26/22 0455  TSH 2.798    BNP Recent Labs  Lab 08/25/22 2220  BNP 61.9    DDimer No results for input(s): "DDIMER" in the last 168 hours.   Radiology/Studies:  MR LUMBAR SPINE WO CONTRAST Result Date: 08/26/2022 CLINICAL DATA:  Initial evaluation for acute myelopathy. EXAM: MRI LUMBAR SPINE WITHOUT CONTRAST TECHNIQUE: Multiplanar, multisequence MR imaging of the lumbar spine was performed. No intravenous contrast was administered. COMPARISON:  None Available. FINDINGS: Segmentation: Standard. Lowest well-formed disc space labeled the L5-S1 level. Alignment: Physiologic with preservation of the normal lumbar lordosis. No listhesis.  Vertebrae: Vertebral body height maintained without acute or chronic fracture. Bone marrow signal intensity within normal limits. No worrisome osseous lesions. Degenerative endplate Schmorl's node deformity with prominent reactive marrow edema present at the inferior endplate of L3. Additional mild reactive endplate changes noted about the L4-5 interspace. Conus medullaris and cauda equina: Conus extends to the T12 level. Conus and cauda equina appear normal. Paraspinal and other soft tissues: Mild edema within the posterior paraspinous soft tissues at the levels of L2-3 and L3-4, which could be reactive in nature due to facet disease or possibly reflect mild muscular injury/strain. No other acute finding. Disc levels: T12-L1: Disc desiccation without disc bulge. Mild facet spurring. No canal or foraminal stenosis. L1-2: Disc desiccation with mild circumferential disc bulge. Mild facet hypertrophy. No spinal stenosis. Foramina remain patent. L2-3: Mild disc bulge. Mild facet and ligament flavum hypertrophy. No spinal stenosis. Mild bilateral L2 foraminal narrowing. L3-4: Disc desiccation with mild disc bulge. Prominent endplate Schmorl's node deformity at the inferior endplate of L3 with associated reactive marrow edema. Associated reactive endplate change with marginal endplate osteophytic spurring, greater on the left. Mild facet and ligament flavum hypertrophy. No significant spinal stenosis. Moderate bilateral L3 foraminal narrowing. L4-5: Disc desiccation with diffuse disc bulge, slightly asymmetric to the left. Associated annular fissure at the level of the left neural foramen. Mild to moderate facet hypertrophy, greater on the left. Resultant mild narrowing of the left lateral recess. Central canal remains patent. Moderate left worse than right L4 foraminal stenosis. L5-S1: Endplate osteophytic spurring without significant disc bulge. Mild facet hypertrophy. No canal or foraminal stenosis. IMPRESSION: 1.  Normal MRI appearance of the conus and cauda equina. 2. Mild edema within the posterior paraspinous soft tissues at the levels of L2-3 and L3-4, which could be reactive in nature due to facet disease or possibly reflect mild muscular injury/strain. 3. Multifactorial degenerative changes at L3-4 and L4-5 with resultant moderate bilateral L3 and L4 foraminal stenosis. 4. Degenerative endplate Schmorl's node deformity with associated reactive marrow edema at the inferior endplate of L3. Finding could serve as a source for lower back pain. Electronically Signed   By: Rise Mu M.D.   On: 08/26/2022 03:56   MR BRAIN WO CONTRAST Result Date: 08/26/2022 CLINICAL DATA:  Initial evaluation for neuro deficit, stroke. EXAM: MRI HEAD WITHOUT CONTRAST TECHNIQUE: Multiplanar, multiecho pulse sequences of the brain and surrounding structures were obtained without intravenous contrast. COMPARISON:  Prior CT from 08/25/2022. FINDINGS: Brain: Mild age-related cerebral atrophy.  Patchy T2/FLAIR hyperintensity involving the periventricular deep white matter both cerebral hemispheres, consistent with chronic small vessel ischemic disease, mild to moderate in nature. No evidence for acute or subacute ischemia. Gray-white matter differentiation maintained. Or is a chronic cortical infarction. No acute or chronic intracranial blood products. No mass lesion, mass effect or midline shift. No hydrocephalus or extra-axial fluid collection. Pituitary gland and suprasellar region within normal limits. Vascular: Major intracranial vascular flow voids are maintained. Skull and upper cervical spine: Craniocervical junction within normal limits. Bone marrow signal intensity normal. 1 cm nodular lesion present at the posterior scalp vertex, indeterminate (series 10, image 27). Sinuses/Orbits: Prior bilateral ocular lens replacement. Paranasal sinuses are clear. Trace left mastoid effusion, of doubtful significance. Other: None.  IMPRESSION: 1. No acute intracranial abnormality. 2. Age-related cerebral atrophy with mild to moderate chronic microvascular ischemic disease. 3. 1 cm nodular lesion at the posterior scalp vertex, indeterminate. Correlation with physical exam recommended. Electronically Signed   By: Rise Mu M.D.   On: 08/26/2022 03:47   DG Chest Portable 1 View Result Date: 08/26/2022 CLINICAL DATA:  Shortness of breath, dizziness.  Fall 3 weeks ago. EXAM: PORTABLE CHEST 1 VIEW COMPARISON:  07/06/2021. FINDINGS: The heart is enlarged and the mediastinal contour is stable. There is atherosclerotic calcification of the aorta. Sternotomy wires and a left atrial appendage clip are noted. No consolidation, effusion, or pneumothorax. No acute osseous abnormality. IMPRESSION: Cardiomegaly with no active disease. Electronically Signed   By: Thornell Sartorius M.D.   On: 08/26/2022 02:37   CT Head Wo Contrast Result Date: 08/25/2022 CLINICAL DATA:  Stroke-like symptoms EXAM: CT HEAD WITHOUT CONTRAST TECHNIQUE: Contiguous axial images were obtained from the base of the skull through the vertex without intravenous contrast. RADIATION DOSE REDUCTION: This exam was performed according to the departmental dose-optimization program which includes automated exposure control, adjustment of the mA and/or kV according to patient size and/or use of iterative reconstruction technique. COMPARISON:  01/25/2020 FINDINGS: Brain: No evidence of acute infarction, hemorrhage, hydrocephalus, extra-axial collection or mass lesion/mass effect.Chronic atrophic changes and white matter ischemic changes are noted. Vascular: No hyperdense vessel or unexpected calcification. Skull: Normal. Negative for fracture or focal lesion. Sinuses/Orbits: No acute finding. Other: None. IMPRESSION: Chronic changes without acute abnormality. Electronically Signed   By: Alcide Clever M.D.   On: 08/25/2022 22:51     Assessment and Plan:   AFib w/SVR RBBB (old) No  obvious/clear symptoms of bradycardia While we are talking he is OOB to the chair, HR climbs to the 40's (from 30's) He feels well with stable BP He reports no lightheaded, dizziness But b/l LE fatigue/weakness Bradycardia not new for him, EKGs with HRs 40's-50's going back a year at least.  Will for now hold his Eliquis, DAPT have him walk to assess HR/symptoms with ambulation. Though suspect he will not need PPM  ADDEND: Margo Aye walk with RN Hr got to the 80's stayed mostly in 70's. HR did not go below 60's   No PPM indicated at this time Will resume diet, meds    Further with IM/attending  team PVD Back?    Risk Assessment/Risk Scores:   For questions or updates, please contact [facility] Please consult [email or web address] for contact info under    Signed, Sheilah Pigeon, PA-C  08/27/2022 8:32 AM

## 2022-08-27 NOTE — Plan of Care (Signed)
  Problem: Education: Goal: Knowledge of General Education information will improve Description: Including pain rating scale, medication(s)/side effects and non-pharmacologic comfort measures Outcome: Progressing   Problem: Health Behavior/Discharge Planning: Goal: Ability to manage health-related needs will improve Outcome: Progressing   Problem: Clinical Measurements: Goal: Will remain free from infection Outcome: Progressing   

## 2022-08-27 NOTE — Evaluation (Signed)
Physical Therapy Evaluation Patient Details Name: [name] MRN: [id number] DOB: 12-12-1940 Today's Date: 08/27/2022  History of Present Illness  Pt is a 82 y/o M admitted on 08/25/22 after presenting with c/o lightheadedness & generalized weakness x 2 weeks. Pt was noted to be bradycardic & cardiology was consulted. PMH: a-fib with slow ventricular response on Eliquis, pulmonary HTN, CAD, OSA  Clinical Impression  Pt seen for PT evaluation with pt agreeable to tx. Pt reports he was independent without AD, very active, driving, denies falls. Pt notes about a month ago he began experiencing BLE weakness & notes his BLE discoloration has gotten worse. On this date, pt presents with impaired balance & gait pattern so provided pt with RW. Pt's balance improves with use of AD but still with impaired gait pattern. Educated pt on recommendation to use RW vs no AD to reduce fall risk, as well as how to use walker bag to transport items. Pt performed 5x STS without BUE support for BLE strengthening. HR as low as 46 bpm with rest, up to 148 bpm with STS activity (does return to 40s bpm with seated rest). Will continue to follow pt acutely to address strengthening, balance, endurance to progress gait with LRAD & reduce fall risk.       Recommendations for follow up therapy are one component of a multi-disciplinary discharge planning process, led by the attending physician.  Recommendations may be updated based on patient status, additional functional criteria and insurance authorization.  Follow Up Recommendations       Assistance Recommended at Discharge PRN  Patient can return home with the following       Equipment Recommendations None recommended by PT (pt reports he has a RW at home)  Recommendations for Other Services       Functional Status Assessment Patient has had a recent decline in their functional status and demonstrates the ability to make significant improvements in function in a  reasonable and predictable amount of time.     Precautions / Restrictions Precautions Precautions: Fall Precaution Comments: 1 fall 3 weeks ago Restrictions Weight Bearing Restrictions: No      Mobility  Bed Mobility               General bed mobility comments: not tested, pt received & left sitting in recliner    Transfers Overall transfer level: Modified independent Equipment used: None               General transfer comment: STS pushing to standing with BUE on chair armrests with independence    Ambulation/Gait Ambulation/Gait assistance: Min guard Gait Distance (Feet): 100 Feet (+ 200 ft) Assistive device: None, Rolling walker (2 wheels) Gait Pattern/deviations: Decreased step length - right, Decreased step length - left, Decreased dorsiflexion - left, Decreased dorsiflexion - right, Decreased stride length Gait velocity: decreased     General Gait Details: Pt ambulates without AD with CGA, provided pt with RW & pt able to ambulate with supervision. Decreased heel strike BLE.  Stairs            Wheelchair Mobility    Modified Rankin (Stroke Patients Only)       Balance Overall balance assessment: Needs assistance   Sitting balance-Leahy Scale: Normal     Standing balance support: During functional activity, No upper extremity supported Standing balance-Leahy Scale: Fair  Pertinent Vitals/Pain Pain Assessment Pain Assessment: No/denies pain    Home Living Family/patient expects to be discharged to:: Private residence Living Arrangements: Spouse/significant other Available Help at Discharge: Family;Available 24 hours/day (pt lives with wife who has early dementia) Type of Home: House Home Access: Stairs to enter Entrance Stairs-Rails: Left Entrance Stairs-Number of Steps: 1   Home Layout: One level Home Equipment: Hand held shower head;Grab bars - toilet;Grab bars - tub/shower;Rolling  Walker (2 wheels)      Prior Function Prior Level of Function : Independent/Modified Independent;Driving             Mobility Comments: Notes 1 slip & LOB within last 6 months. 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   Assessment/Plan    PT Assessment Patient needs continued PT services  PT Problem List Decreased strength;Decreased activity tolerance;Decreased balance;Decreased mobility;Decreased knowledge of use of DME       PT Treatment Interventions DME instruction;Therapeutic exercise;Gait training;Balance training;Stair training;Neuromuscular re-education;Functional mobility training;Therapeutic activities;Patient/family education;Modalities    PT Goals (Current goals can be found in the Care Plan section)  Acute Rehab PT Goals Patient Stated Goal: figure out what's going on, strengthening his legs, return to independent PLOF PT Goal Formulation: With patient Time For Goal  Achievement: 09/10/22 Potential to Achieve Goals: Good    Frequency Min 3X/week     Co-evaluation               AM-PAC PT "6 Clicks" Mobility  Outcome Measure Help needed turning from your back to your side while in a flat bed without using bedrails?: None Help needed moving from lying on your back to sitting on the side of a flat bed without using bedrails?: None Help needed moving to and from a bed to a chair (including a wheelchair)?: None Help needed standing up from a chair using your arms (e.g., wheelchair or bedside chair)?: None Help needed to walk in hospital room?: A Little Help needed climbing 3-5 steps with a railing? : A Little 6 Click Score: 22    End of Session Equipment Utilized During Treatment: Gait belt Activity Tolerance: Patient tolerated treatment well Patient left: in chair;with call bell/phone within reach   PT Visit Diagnosis: Muscle weakness (generalized) (M62.81);Other abnormalities of gait and mobility (R26.89);Unsteadiness on feet (R26.81);Difficulty in walking, not elsewhere classified (R26.2)    Time: 1610-9604 PT Time Calculation (min) (ACUTE ONLY): 28 min  Charges:   PT Evaluation $PT Eval Low Complexity: 1 Low          Aleda Grana, PT, DPT 08/27/22, 2:00 PM   Sandi Mariscal 08/27/2022, 1:58 PM

## 2022-08-27 NOTE — Progress Notes (Signed)
Patient ambulated for 250 ft. HR increased to 70's and did not go below 60s. Patient reported no dizziness or other symptoms.

## 2022-08-27 NOTE — Progress Notes (Signed)
Pt removed bipap. States it is too forceful and dried out his mouth and throat. Placed pt on [facility] supplemental o2 for comfort & perfusion.

## 2022-08-27 NOTE — Progress Notes (Signed)
  Echocardiogram 2D Echocardiogram has been performed.  Delcie Roch 08/27/2022, 9:57 AM

## 2022-08-27 NOTE — TOC CM/SW Note (Signed)
Transition of Care [facility]) - Inpatient Brief Assessment   Patient Details  Name: JACSON RAPAPORT MRN: [id number] Date of Birth: 1940/05/12  [facility]) CM/SW Contact:    Leone Haven, RN Phone Number: 08/27/2022, 12:25 PM   Clinical Narrative: From home with wife, he has PCP with [facility], he uses a walker at home, and he has a bipap machine.  He does not have any HH services in place at this time.  His wife will transport him home at dc, she is his support system.  His children live in [place] he gets his medications from [facility]. TOC following.   Transition of Care Asessment: Insurance and Status: Insurance coverage has been reviewed Patient has primary care physician: Yes Home environment has been reviewed: home with wife Prior level of function:: indep with walker Prior/Current Home Services: No current home services Social Determinants of Health Reivew: SDOH reviewed no interventions necessary Readmission risk has been reviewed: Yes Transition of care needs: no transition of care needs at this time

## 2022-08-27 NOTE — Care Management Obs Status (Signed)
MEDICARE OBSERVATION STATUS NOTIFICATION   Patient Details  Name: [name] MRN: [id number] Date of Birth: 06-01-1940   Medicare Observation Status Notification Given:  Yes    Leone Haven, RN 08/27/2022, 12:06 PM

## 2022-08-27 NOTE — Progress Notes (Signed)
   08/27/22 2315  BiPAP/CPAP/SIPAP  Reason BIPAP/CPAP not in use Other(comment) ("too dry;going home tomorrow")

## 2022-08-27 NOTE — Evaluation (Signed)
Occupational Therapy Evaluation Patient Details Name: [name] MRN: [id number] DOB: 1940-05-27 Today's Date: 08/27/2022   History of Present Illness Pt is a 82 year old man admitted on 6/8 with worsening dizziness and generalized weakness. + bradycardia, hyponatremia, hypokalmemi, suspect orthostasis. PMH: OSA, CAD, CABG, colon ca, HTN, COPD, afib with slow ventricular response, neuropathy.   Clinical Impression   Pt is typically independent and drives. 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 Pt lives with his wife. Pt is overall functioning at a set up to supervision level. He feels more confident using RW stating he does not yet trust his legs. Recommended pt consider seated showering for safety. No further OT needs.     Recommendations for follow up therapy are one component of a multi-disciplinary discharge planning process, led by the attending physician.  Recommendations may be updated based on patient status, additional functional criteria and insurance authorization.   Assistance Recommended at Discharge PRN  Patient can return home with the following      Functional Status Assessment  Patient has had a recent decline in their functional status and demonstrates the ability to make significant improvements in function in a reasonable and predictable amount of time.  Equipment Recommendations  Tub/shower seat (if symptoms remain)    Recommendations for Other Services       Precautions / Restrictions Precautions Precautions: Fall Precaution Comments: 1 fall 3 weeks ago Restrictions Weight Bearing Restrictions: No      Mobility Bed Mobility               General bed mobility comments: in chair    Transfers Overall transfer level: Modified independent Equipment used: None                      Balance Overall balance assessment: Needs assistance   Sitting balance-Leahy Scale: Normal       Standing balance-Leahy Scale: Fair                              ADL either performed or assessed with clinical judgement   ADL Overall ADL's : Needs assistance/impaired Eating/Feeding: Independent   Grooming: Supervision/safety;Standing   Upper Body Bathing: Set up;Sitting   Lower Body Bathing: Sit to/from stand;Set up   Upper Body Dressing : Set up;Sitting   Lower Body Dressing: Set up;Sit to/from stand   Toilet Transfer: Supervision/safety;Rolling walker (2 wheels)       Tub/ Engineer, structural: Modified independent   Functional mobility during ADLs: Supervision/safety;Rolling walker (2 wheels)       Vision Baseline Vision/History: 1 Wears glasses Ability to See in Adequate Light: 0 Adequate Patient Visual Report: No change from baseline       Perception     Praxis      Pertinent Vitals/Pain Pain Assessment Pain Assessment: No/denies pain     Hand Dominance Right   Extremity/Trunk Assessment Upper Extremity Assessment Upper Extremity Assessment: Overall WFL for tasks assessed   Lower Extremity Assessment Lower Extremity Assessment: Defer to PT evaluation   Cervical / Trunk Assessment Cervical / Trunk Assessment: Normal   Communication Communication Communication: No difficulties   Cognition Arousal/Alertness: Awake/alert Behavior During Therapy: WFL for tasks assessed/performed Overall Cognitive Status: Within Functional Limits for tasks assessed  General Comments       Exercises     Shoulder Instructions      Home Living Family/patient expects to be discharged to:: Private residence Living Arrangements: Spouse/significant other Available Help at Discharge: Family;Available 24 hours/day (wife has early dementi) Type of Home: House Home Access: Stairs to enter [employer] of Steps: 1 Entrance Stairs-Rails: Left Home Layout: One level     Bathroom Shower/Tub: Producer, television/film/video: Handicapped height      Home Equipment: Hand held shower head;Grab bars - toilet;Grab bars - tub/shower;Rolling Walker (2 wheels)          Prior Functioning/Environment Prior Level of Function : Independent/Modified Independent;Driving               ADLs Comments: loves to cook        OT Problem List:        OT Treatment/Interventions:      OT Goals(Current goals can be found in the care plan section) Acute Rehab OT Goals OT Goal Formulation: With patient  OT Frequency:      Co-evaluation              AM-PAC OT "6 Clicks" Daily Activity     Outcome Measure Help from another person eating meals?: None Help from another person taking care of personal grooming?: None Help from another person toileting, which includes using toliet, bedpan, or urinal?: None Help from another person bathing (including washing, rinsing, drying)?: None Help from another person to put on and taking off regular upper body clothing?: None Help from another person to put on and taking off regular lower body clothing?: None 6 Click Score: 24   End of Session Equipment Utilized During Treatment: Gait belt;Rolling walker (2 wheels)  Activity Tolerance: Patient tolerated treatment well Patient left: in chair;with call bell/phone within reach  OT Visit Diagnosis: Unsteadiness on feet (R26.81)                Time: 1610-9604 OT Time Calculation (min): 18 min Charges:  OT General Charges $OT Visit: 1 Visit OT Evaluation $OT Eval Low Complexity: 1 Low  Berna Spare, OTR/L Acute Rehabilitation Services Office: [phone]  Evern Bio 08/27/2022, 1:15 PM

## 2022-08-28 DIAGNOSIS — R55 Syncope and collapse: Secondary | ICD-10-CM | POA: Diagnosis not present

## 2022-08-28 LAB — BASIC METABOLIC PANEL
Anion gap: 12 (ref 5–15)
BUN: 20 mg/dL (ref 8–23)
CO2: 18 mmol/L — ABNORMAL LOW (ref 22–32)
Calcium: 8.7 mg/dL — ABNORMAL LOW (ref 8.9–10.3)
Chloride: 102 mmol/L (ref 98–111)
Creatinine, Ser: 0.75 mg/dL (ref 0.61–1.24)
GFR, Estimated: >60 mL/min (ref >60)
Glucose, Bld: 85 mg/dL (ref 70–99)
Potassium: 4.3 mmol/L (ref 3.5–5.1)
Sodium: 132 mmol/L — ABNORMAL LOW (ref 135–145)

## 2022-08-28 LAB — CBC
HCT: 37.4 % — ABNORMAL LOW (ref 39.0–52.0)
Hemoglobin: 12.7 g/dL — ABNORMAL LOW (ref 13.0–17.0)
MCH: 31.8 pg (ref 26.0–34.0)
MCHC: 34 g/dL (ref 30.0–36.0)
MCV: 93.7 fL (ref 80.0–100.0)
Platelets: 137 10*3/uL — ABNORMAL LOW (ref 150–400)
RBC: 3.99 MIL/uL — ABNORMAL LOW (ref 4.22–5.81)
RDW: 14.8 % (ref 11.5–15.5)
WBC: 7.5 10*3/uL (ref 4.0–10.5)
nRBC: 0 % (ref 0.0–0.2)

## 2022-08-28 MED ORDER — POTASSIUM CHLORIDE CRYS ER 20 MEQ PO TBCR: 20.0000 meq | EXTENDED_RELEASE_TABLET | Freq: Every day | ORAL | 0 refills | Status: DC

## 2022-08-28 MED ORDER — POLYVINYL ALCOHOL 1.4 % OP SOLN: 1.0000 [drp] | OPHTHALMIC | Status: DC | PRN

## 2022-08-28 MED ORDER — FUROSEMIDE 40 MG PO TABS: 40.0000 mg | ORAL_TABLET | Freq: Every day | ORAL | 3 refills | Status: DC

## 2022-08-28 NOTE — Plan of Care (Signed)

## 2022-08-28 NOTE — TOC Transition Note (Addendum)
Transition of Care [facility]) - CM/SW Discharge Note   Patient Details  Name: [name] MRN: [id number] Date of Birth: [birth year]  Transition of Care [facility]) CM/SW Contact:  Leone Haven, RN Phone Number: 08/28/2022, 10:41 AM   Clinical Narrative:    Patient is for dc today, per PT eval rec outpatient physical therapy, he states he would like to go to the one [facility]   NCM sent referral to outpt physical therapy Rehab  .  NCM offered choice for shower stool, he does not have a preference.  [employer] will deliver stool to room.  Patient decided he did not want the shower chair, he states he will get one from [facility].  NCM informed Jermaine with [employer] to cancel order.    Final next level of care: Home/Self Care Barriers to Discharge: No Barriers Identified   Patient Goals and CMS Choice CMS Medicare.gov Compare Post Acute Care list provided to:: Patient Choice offered to / list presented to : Patient  Discharge Placement                         Discharge Plan and Services Additional resources added to the After Visit Summary for                  DME Arranged: Shower stool DME Agency: [employer] Date DME Agency Contacted: 08/28/22 Time DME Agency Contacted: 1041 Representative spoke with at DME Agency: Vaughan Basta HH Arranged: NA          Social Determinants of Health (SDOH) Interventions SDOH Screenings   Food Insecurity: No Food Insecurity (08/26/2022)  Housing: Low Risk  (08/26/2022)  Transportation Needs: No Transportation Needs (08/26/2022)  Utilities: Not At Risk (08/26/2022)  Depression (PHQ2-9): Low Risk  (04/14/2018)  Financial Resource Strain: Low Risk  (04/08/2018)  Physical Activity: Unknown (04/08/2018)  Stress: Stress Concern Present (04/08/2018)  Tobacco Use: Medium Risk (08/26/2022)     Readmission Risk Interventions     No data to display

## 2022-08-28 NOTE — Discharge Summary (Signed)
[facility]  Physician Discharge Summary   Patient ID: [name] MRN: [id number] DOB/AGE: 1941/01/18 82 y.o.  Admit date: 08/25/2022 Discharge date: 08/28/2022    PCP: [name]  DISCHARGE DIAGNOSES:    Near syncope   Atrial fibrillation with slow ventricular response (HCC)   CAD S/P percutaneous coronary angioplasty   Pulmonary hypertension, unspecified (HCC)   Peripheral arterial disease (HCC)   Obstructive sleep apnea   Hyponatremia   RECOMMENDATIONS FOR OUTPATIENT FOLLOW UP: Outpatient physical therapy Follow-up with Dr. Allyson Keller: Has an upcoming appointment Follow-up with PCP for blood work to check electrolytes in 1 to 2 weeks.   Home Health: Outpatient PT Equipment/Devices: None  CODE STATUS: Full code  DISCHARGE CONDITION: fair  Diet recommendation: As before  INITIAL HISTORY: 82 y.o. male with medical history significant for atrial fibrillation with slow ventricular response on Eliquis, pulmonary hypertension, CAD, and OSA who presented to the emergency department with lightheadedness and generalized weakness.  This has been ongoing for about 2 weeks now.  Patient was noted to be bradycardic.  Patient was seen by cardiology.  Patient was hospitalized for further management.   Consultants: Cardiology   Procedures: Echocardiogram   HOSPITAL COURSE:   Near syncope Possibly secondary to orthostatic hypotension.  He was on diuretics which could have contributed to.  Diuretics were placed on hold.  Patient was given IV fluid bolus. Seems to be stable for the most part.  Echocardiogram does not show any new changes.  EF is noted to be normal. Patient was seen by cardiology.  Ambulated with physical therapy.  No recurrence of his symptoms here in the hospital. Patient also mention weakness of his lower extremities.  MRI brain was done which did not show any acute findings.  MRI lumbar spine also did not show any acute findings.   His weakness has significantly improved.   Atrial fibrillation with slow ventricular response Heart rate was noted to be in the 30s and occasionally in the 20s when asleep.  Patient was seen by cardiology and electrophysiology.  He was ambulated with that his heart rate went up into the 70s.  No indication for pacemaker.  Eliquis being continued as well.  Patient is also on aspirin and Plavix for his peripheral artery disease.     Peripheral artery disease Followed by Dr. Allyson Keller for the same.  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  Continue with aspirin and Plavix.  Motor lower extremities of a cold touch but no changes from before per patient.  He has ABIs and vascular ultrasound of the IVC and iliacs were unchanged when checked in April of this year.   Coronary artery disease/pulmonary hypertension Stable.  Continue statin. Patient was on multiple diuretics including furosemide twice a day, metolazone twice a week and spironolactone once a day.  Patient feels that he is on too many diuretics.  He felt very dehydrated when he came in.  His diuretic dosage has been adjusted.   Obstructive sleep apnea BiPAP nightly.   Hyponatremia/hypokalemia  Patient is stable.  Okay for discharge home today.    PERTINENT LABS:  The results of significant diagnostics from this hospitalization (including imaging, microbiology, ancillary and laboratory) are listed below for reference.    Labs:   Basic Metabolic Panel: Recent Labs  Lab 08/25/22 2220 08/26/22 0455 08/27/22 0058 08/28/22 0042  NA 131* 131* 130* 132*  K 3.6 3.3* 4.6 4.3  CL  92* 97* 99 102  CO2 27 25 21* 18*  GLUCOSE 114* 105* 95 85  BUN 16 15 21 20   CREATININE 0.90 0.77 1.05 0.75  CALCIUM 9.5 8.9 9.0 8.7*  MG  --  2.2  --   --     CBC: Recent Labs  Lab 08/25/22 2220 08/26/22 0455 08/27/22 0058 08/28/22 0042  WBC 6.8 7.0 7.4 7.5  HGB 13.3 12.6*  13.0 12.7*  HCT 39.8 37.1* 39.5 37.4*  MCV 91.5 92.8 97.8 93.7  PLT 161 147* 146* 137*    BNP: BNP (last 3 results) Recent Labs    11/16/21 1108 06/29/22 1626 08/25/22 2220  BNP 89.6 89.1 61.9     CBG: Recent Labs  Lab 08/25/22 2219  GLUCAP 161*     IMAGING STUDIES ECHOCARDIOGRAM COMPLETE  Result Date: 08/27/2022    ECHOCARDIOGRAM REPORT   Patient Name:   [name] Date of Exam: 08/27/2022 Medical Rec #:  [id number]        Height:       75.0 in Accession #:    [id number]       Weight:       209.9 lb Date of Birth:  Dec 14, 1940       BSA:          2.240 m Patient Age:    81 years         BP:           145/61 mmHg Patient Gender: M                HR:           45 bpm. Exam Location:  Inpatient Procedure: 2D Echo, Cardiac Doppler and Color Doppler Indications:    syncope  History:        Patient has prior history of Echocardiogram examinations, most                 recent 12/20/2020. Prior CABG, Pulmonary HTN and PAD,                 Arrythmias:Bradycardia and Atrial Fibrillation,                 Signs/Symptoms:Syncope and Edema; Risk Factors:Sleep Apnea and                 Hypertension.  Sonographer:    Delcie Roch RDCS Referring Phys: [id number] [name] IMPRESSIONS  1. Patient bradycardic with HR 30-low 40s during exam.  2. Left ventricular ejection fraction, by estimation, is 55 to 60%. The left ventricle has normal function. The left ventricle has no regional wall motion abnormalities. There is mild left ventricular hypertrophy. Left ventricular diastolic parameters are indeterminate.  3. Right ventricular systolic function is mildly reduced. The right ventricular size is mildly enlarged. There is moderately elevated pulmonary artery systolic pressure. The estimated right ventricular systolic pressure is 53.9 mmHg.  4. Left atrial size was moderately dilated.  5. Right atrial size was severely dilated.  6. The mitral valve is degenerative. Trivial mitral valve  regurgitation.  7. Tricuspid valve regurgitation is moderate.  8. The aortic valve is tricuspid. There is mild calcification of the aortic valve. There is mild thickening of the aortic valve. Aortic valve regurgitation is not visualized. Aortic valve sclerosis/calcification is present, without any evidence of aortic stenosis.  9. Aortic dilatation noted. There is borderline dilatation of the aortic root, measuring 40 mm. 10. The inferior vena cava is dilated in size  with <50% respiratory variability, suggesting right atrial pressure of 15 mmHg. Comparison(s): Compared to prior TTE in 12/2020, the patient is now bradycardic with HR 30-40s. Hyperdynamic LV function is no longer present but EF still within normal range. FINDINGS  Left Ventricle: Left ventricular ejection fraction, by estimation, is 55 to 60%. The left ventricle has normal function. The left ventricle has no regional wall motion abnormalities. The left ventricular internal cavity size was normal in size. There is  mild left ventricular hypertrophy. Abnormal (paradoxical) septal motion consistent with post-operative status. Left ventricular diastolic parameters are indeterminate. Right Ventricle: The right ventricular size is mildly enlarged. No increase in right ventricular wall thickness. Right ventricular systolic function is mildly reduced. There is moderately elevated pulmonary artery systolic pressure. The tricuspid regurgitant velocity is 3.12 m/s, and with an assumed right atrial pressure of 15 mmHg, the estimated right ventricular systolic pressure is 53.9 mmHg. Left Atrium: Left atrial size was moderately dilated. Right Atrium: Right atrial size was severely dilated. Pericardium: There is no evidence of pericardial effusion. Mitral Valve: The mitral valve is degenerative in appearance. There is mild thickening of the mitral valve leaflet(s). There is mild calcification of the mitral valve leaflet(s). Mild mitral annular calcification. Trivial  mitral valve regurgitation. Tricuspid Valve: The tricuspid valve is normal in structure. Tricuspid valve regurgitation is moderate. Aortic Valve: The aortic valve is tricuspid. There is mild calcification of the aortic valve. There is mild thickening of the aortic valve. Aortic valve regurgitation is not visualized. Aortic valve sclerosis/calcification is present, without any evidence of aortic stenosis. Pulmonic Valve: The pulmonic valve was normal in structure. Pulmonic valve regurgitation is trivial. Aorta: Aortic dilatation noted. There is borderline dilatation of the aortic root, measuring 40 mm. Venous: The inferior vena cava is dilated in size with less than 50% respiratory variability, suggesting right atrial pressure of 15 mmHg. IAS/Shunts: The atrial septum is grossly normal.  LEFT VENTRICLE PLAX 2D LVIDd:         4.30 cm   Diastology LVIDs:         3.20 cm   LV e' medial:    5.22 cm/s LV PW:         1.30 cm   LV E/e' medial:  25.9 LV IVS:        1.30 cm   LV e' lateral:   12.30 cm/s LVOT diam:     2.20 cm   LV E/e' lateral: 11.0 LV SV:         102 LV SV Index:   45 LVOT Area:     3.80 cm  RIGHT VENTRICLE            IVC RV Basal diam:  4.00 cm    IVC diam: 3.00 cm RV S prime:     9.14 cm/s TAPSE (M-mode): 1.8 cm LEFT ATRIUM              Index        RIGHT ATRIUM           Index LA diam:        4.40 cm  1.96 cm/m   RA Area:     30.70 cm LA Vol (A2C):   96.5 ml  43.08 ml/m  RA Volume:   109.00 ml 48.66 ml/m LA Vol (A4C):   102.0 ml 45.54 ml/m LA Biplane Vol: 103.0 ml 45.99 ml/m  AORTIC VALVE LVOT Vmax:   107.00 cm/s LVOT Vmean:  70.200 cm/s LVOT VTI:  0.268 m  AORTA Ao Root diam: 3.30 cm Ao Asc diam:  3.70 cm MITRAL VALVE                TRICUSPID VALVE MV Area (PHT): 3.27 cm     TR Peak grad:   38.9 mmHg MV Decel Time: 232 msec     TR Vmax:        312.00 cm/s MV E velocity: 135.00 cm/s                             SHUNTS                             Systemic VTI:  0.27 m                              Systemic Diam: 2.20 cm Laurance Flatten MD Electronically signed by Laurance Flatten MD Signature Date/Time: 08/27/2022/11:53:55 AM    Final    MR LUMBAR SPINE WO CONTRAST  Result Date: 08/26/2022 CLINICAL DATA:  Initial evaluation for acute myelopathy. EXAM: MRI LUMBAR SPINE WITHOUT CONTRAST TECHNIQUE: Multiplanar, multisequence MR imaging of the lumbar spine was performed. No intravenous contrast was administered. COMPARISON:  None Available. FINDINGS: Segmentation: Standard. Lowest well-formed disc space labeled the L5-S1 level. Alignment: Physiologic with preservation of the normal lumbar lordosis. No listhesis. Vertebrae: Vertebral body height maintained without acute or chronic fracture. Bone marrow signal intensity within normal limits. No worrisome osseous lesions. Degenerative endplate Schmorl's node deformity with prominent reactive marrow edema present at the inferior endplate of L3. Additional mild reactive endplate changes noted about the L4-5 interspace. Conus medullaris and cauda equina: Conus extends to the T12 level. Conus and cauda equina appear normal. Paraspinal and other soft tissues: Mild edema within the posterior paraspinous soft tissues at the levels of L2-3 and L3-4, which could be reactive in nature due to facet disease or possibly reflect mild muscular injury/strain. No other acute finding. Disc levels: T12-L1: Disc desiccation without disc bulge. Mild facet spurring. No canal or foraminal stenosis. L1-2: Disc desiccation with mild circumferential disc bulge. Mild facet hypertrophy. No spinal stenosis. Foramina remain patent. L2-3: Mild disc bulge. Mild facet and ligament flavum hypertrophy. No spinal stenosis. Mild bilateral L2 foraminal narrowing. L3-4: Disc desiccation with mild disc bulge. Prominent endplate Schmorl's node deformity at the inferior endplate of L3 with associated reactive marrow edema. Associated reactive endplate change with marginal endplate osteophytic spurring,  greater on the left. Mild facet and ligament flavum hypertrophy. No significant spinal stenosis. Moderate bilateral L3 foraminal narrowing. L4-5: Disc desiccation with diffuse disc bulge, slightly asymmetric to the left. Associated annular fissure at the level of the left neural foramen. Mild to moderate facet hypertrophy, greater on the left. Resultant mild narrowing of the left lateral recess. Central canal remains patent. Moderate left worse than right L4 foraminal stenosis. L5-S1: Endplate osteophytic spurring without significant disc bulge. Mild facet hypertrophy. No canal or foraminal stenosis. IMPRESSION: 1. Normal MRI appearance of the conus and cauda equina. 2. Mild edema within the posterior paraspinous soft tissues at the levels of L2-3 and L3-4, which could be reactive in nature due to facet disease or possibly reflect mild muscular injury/strain. 3. Multifactorial degenerative changes at L3-4 and L4-5 with resultant moderate bilateral L3 and L4 foraminal stenosis. 4. Degenerative endplate Schmorl's node deformity with associated reactive  marrow edema at the inferior endplate of L3. Finding could serve as a source for lower back pain. Electronically Signed   By: Rise Mu M.D.   On: 08/26/2022 03:56   MR BRAIN WO CONTRAST  Result Date: 08/26/2022 CLINICAL DATA:  Initial evaluation for neuro deficit, stroke. EXAM: MRI HEAD WITHOUT CONTRAST TECHNIQUE: Multiplanar, multiecho pulse sequences of the brain and surrounding structures were obtained without intravenous contrast. COMPARISON:  Prior CT from 08/25/2022. FINDINGS: Brain: Mild age-related cerebral atrophy. Patchy T2/FLAIR hyperintensity involving the periventricular deep white matter both cerebral hemispheres, consistent with chronic small vessel ischemic disease, mild to moderate in nature. No evidence for acute or subacute ischemia. Gray-white matter differentiation maintained. Or is a chronic cortical infarction. No acute or chronic  intracranial blood products. No mass lesion, mass effect or midline shift. No hydrocephalus or extra-axial fluid collection. Pituitary gland and suprasellar region within normal limits. Vascular: Major intracranial vascular flow voids are maintained. Skull and upper cervical spine: Craniocervical junction within normal limits. Bone marrow signal intensity normal. 1 cm nodular lesion present at the posterior scalp vertex, indeterminate (series 10, image 27). Sinuses/Orbits: Prior bilateral ocular lens replacement. Paranasal sinuses are clear. Trace left mastoid effusion, of doubtful significance. Other: None. IMPRESSION: 1. No acute intracranial abnormality. 2. Age-related cerebral atrophy with mild to moderate chronic microvascular ischemic disease. 3. 1 cm nodular lesion at the posterior scalp vertex, indeterminate. Correlation with physical exam recommended. Electronically Signed   By: Rise Mu M.D.   On: 08/26/2022 03:47   DG Chest Portable 1 View  Result Date: 08/26/2022 CLINICAL DATA:  Shortness of breath, dizziness.  Fall 3 weeks ago. EXAM: PORTABLE CHEST 1 VIEW COMPARISON:  07/06/2021. FINDINGS: The heart is enlarged and the mediastinal contour is stable. There is atherosclerotic calcification of the aorta. Sternotomy wires and a left atrial appendage clip are noted. No consolidation, effusion, or pneumothorax. No acute osseous abnormality. IMPRESSION: Cardiomegaly with no active disease. Electronically Signed   By: Thornell Sartorius M.D.   On: 08/26/2022 02:37   CT Head Wo Contrast  Result Date: 08/25/2022 CLINICAL DATA:  Stroke-like symptoms EXAM: CT HEAD WITHOUT CONTRAST TECHNIQUE: Contiguous axial images were obtained from the base of the skull through the vertex without intravenous contrast. RADIATION DOSE REDUCTION: This exam was performed according to the departmental dose-optimization program which includes automated exposure control, adjustment of the mA and/or kV according to patient  size and/or use of iterative reconstruction technique. COMPARISON:  01/25/2020 FINDINGS: Brain: No evidence of acute infarction, hemorrhage, hydrocephalus, extra-axial collection or mass lesion/mass effect.Chronic atrophic changes and white matter ischemic changes are noted. Vascular: No hyperdense vessel or unexpected calcification. Skull: Normal. Negative for fracture or focal lesion. Sinuses/Orbits: No acute finding. Other: None. IMPRESSION: Chronic changes without acute abnormality. Electronically Signed   By: Alcide Clever M.D.   On: 08/25/2022 22:51    DISCHARGE EXAMINATION: Vitals:   08/27/22 1602 08/27/22 1938 08/28/22 0215 08/28/22 0833  BP: (!) 138/55 (!) 139/46 (!) 142/51 (!) 140/52  Pulse: 62 (!) 43 (!) 43 (!) 45  Resp: 19 19 19 18   Temp: 98.3 F (36.8 C) 98.3 F (36.8 C) 98.3 F (36.8 C) 98.4 F (36.9 C)  TempSrc: Oral Oral Oral Oral  SpO2: 100% 98% 99% 98%  Weight:      Height:       General appearance: Awake alert.  In no distress Resp: Clear to auscultation bilaterally.  Normal effort Cardio: S1-S2 is normal regular.  No S3-S4.  No  rubs murmurs or bruit GI: Abdomen is soft.  Nontender nondistended.  Bowel sounds are present normal.  No masses organomegaly Able to move all of his extremities without any problems.   DISPOSITION: Home  Discharge Instructions     Call MD for:  difficulty breathing, headache or visual disturbances   Complete by: As directed    Call MD for:  extreme fatigue   Complete by: As directed    Call MD for:  persistant dizziness or light-headedness   Complete by: As directed    Call MD for:  persistant nausea and vomiting   Complete by: As directed    Call MD for:  severe uncontrolled pain   Complete by: As directed    Call MD for:  temperature >100.4   Complete by: As directed    Diet - low sodium heart healthy   Complete by: As directed    Discharge instructions   Complete by: As directed    Please take your medications as prescribed.   Please note change in the diuretic regimen: Furosemide has been decreased to once a day starting on Saturday 6/15.  Spironolactone has been discontinued.  Please follow-up with your primary care provider in 1 week for blood work to check your potassium levels.  You were cared for by a hospitalist during your hospital stay. If you have any questions about your discharge medications or the care you received while you were in the hospital after you are discharged, you can call the unit and asked to speak with the hospitalist on call if the hospitalist that took care of you is not available. Once you are discharged, your primary care physician will handle any further medical issues. Please note that NO REFILLS for any discharge medications will be authorized once you are discharged, as it is imperative that you return to your primary care physician (or establish a relationship with a primary care physician if you do not have one) for your aftercare needs so that they can reassess your need for medications and monitor your lab values. If you do not have a primary care physician, you can call [phone] for a physician referral.   Increase activity slowly   Complete by: As directed    No wound care   Complete by: As directed           Allergies as of 08/28/2022   No Known Allergies      Medication List     STOP taking these medications    spironolactone 25 MG tablet Commonly known as: ALDACTONE       TAKE these medications    Alpha-Lipoic Acid 600 MG Tabs Take 1,200 mg by mouth daily.   ascorbic acid 500 MG tablet Commonly known as: VITAMIN C Take 500 mg by mouth 3 (three) times daily.   aspirin 81 MG chewable tablet Chew 81 mg by mouth daily.   atorvastatin 80 MG tablet Commonly known as: LIPITOR TAKE 1 TABLET AT 6PM. What changed: See the new instructions.   cholecalciferol 1000 units tablet Commonly known as: VITAMIN D Take 1,000 Units by mouth daily.   clopidogrel 75 MG  tablet Commonly known as: PLAVIX Take 1 tablet (75 mg total) by mouth daily with breakfast.   Coenzyme Q10 200 MG capsule Take 200 mg by mouth daily.   Eliquis 5 MG Tabs tablet Generic drug: apixaban TAKE 1 TABLET BY MOUTH TWICE  DAILY   Flaxseed (Linseed) 1000 MG Caps Take 1,000 mg by mouth daily.  furosemide 40 MG tablet Commonly known as: LASIX Take 1 tablet (40 mg total) by mouth daily. Please note change in dose from twice a day to once a day starting on 09/01/2022. Start taking on: September 01, 2022 What changed:  See the new instructions. These instructions start on September 01, 2022. If you are unsure what to do until then, ask your doctor or other care provider.   Magnesium Citrate 200 MG Tabs Take 400 mg by mouth daily in the afternoon.   metolazone 2.5 MG tablet Commonly known as: ZAROXOLYN Take 1 tablet (2.5 mg total) by mouth 2 (two) times a week. Every Mon and Fri   potassium chloride SA 20 MEQ tablet Commonly known as: KLOR-CON M Take 1 tablet (20 mEq total) by mouth daily.   pyridoxine 200 MG tablet Commonly known as: B-6 Take 200 mg by mouth daily.   Systane 0.4-0.3 % Gel ophthalmic gel Generic drug: Polyethyl Glycol-Propyl Glycol Place 1 application. into both eyes every 8 (eight) hours as needed (dry eyes).   VITAMIN B 12 PO Take 200 mcg by mouth daily.               Durable Medical Equipment  (From admission, onward)           Start     Ordered   08/28/22 1035  For home use only DME Shower stool  Once        08/28/22 1034              Follow-up Information     Thana Ates, MD. Go on 09/10/2022.   Specialty: Internal Medicine Why: @9 :30am Contact information: [address] [place] [address] [phone]         [facility] Follow up.   Specialty: Rehabilitation Why: they will call you to set up apt, if you do not hear from them in 3 business days please  call them Contact information: [address] [id number] mc [place] [address] [phone]                TOTAL DISCHARGE TIME: 35 minutes  Cortlyn Cannell Rito Ehrlich  [facility] Pager on [email or web address]  08/28/2022, 12:44 PM

## 2022-08-28 NOTE — Progress Notes (Signed)
Pt AVS print and Discharge instructions given IV and Tele removed waiting on wife to bring clean clothes

## 2022-09-06 DIAGNOSIS — E876 Hypokalemia: Secondary | ICD-10-CM | POA: Diagnosis not present

## 2022-09-07 ENCOUNTER — Ambulatory Visit: Payer: Medicare Other | Attending: Cardiovascular Disease | Admitting: Pharmacist

## 2022-09-07 ENCOUNTER — Encounter: Payer: Self-pay | Admitting: Pharmacist

## 2022-09-07 ENCOUNTER — Telehealth: Payer: Self-pay | Admitting: Pharmacist

## 2022-09-07 VITALS — BP 180/69 | HR 41 | Wt 220.0 lb

## 2022-09-07 DIAGNOSIS — I5032 Chronic diastolic (congestive) heart failure: Secondary | ICD-10-CM

## 2022-09-07 DIAGNOSIS — I10 Essential (primary) hypertension: Secondary | ICD-10-CM

## 2022-09-07 DIAGNOSIS — I4891 Unspecified atrial fibrillation: Secondary | ICD-10-CM

## 2022-09-07 MED ORDER — FUROSEMIDE 40 MG PO TABS
40.0000 mg | ORAL_TABLET | Freq: Two times a day (BID) | ORAL | 2 refills | Status: DC
Start: 2022-09-07 — End: 2023-09-13

## 2022-09-07 MED ORDER — DAPAGLIFLOZIN PROPANEDIOL 10 MG PO TABS: 10.0000 mg | ORAL_TABLET | Freq: Every day | ORAL | 0 refills | Status: DC

## 2022-09-07 MED ORDER — METOLAZONE 2.5 MG PO TABS
ORAL_TABLET | ORAL | 2 refills | Status: DC
Start: 2022-09-10 — End: 2023-04-22

## 2022-09-07 NOTE — Progress Notes (Signed)
Patient ID: RAMONTE MENA                 DOB: 1940/07/30                      MRN: [id number]     HPI: FABIAN WALDER is a 82 y.o. male referred by Dr. Allyson Sabal to HTN clinic. PMH is significant for A fib, PAD, NSTEMI, pulmonary HTN, HDL, CABG x 4, and HLD. Patient called clinic 4 weeks ago to report above normal BP readings and was asked to log readings and see HTN clinic.  Patient presents today and reports he feels poorly. Went to ED on 08/25/22 for near syncope. Was taking furosemide 40mg  BID, metolazone, and spironolactone. It was thought he was hypovolemic and spiro was discontinued and furosemide was reduced to 40mg  daily. Patient reports an 8# weight gain in last 2 days with SOB with light exertion.  Echo on 08/27/22 showed EF of 55-60% with bradycardia.  Reports he is having work done on his house. Became short of breath walking around the house and BP increased to 183/81 with pulse of 78. Went inside and sat down. BP decreased to 132/61 and pulse decreased to 52.   Home readings: 6/20: 131/55,  6/19: 130/60,  6/18: 127/56 6/17: 120/57   Wt Readings from Last 3 Encounters:  08/27/22 209 lb 14.1 oz (95.2 kg)  07/31/22 210 lb 6.4 oz (95.4 kg)  06/29/22 215 lb 6.4 oz (97.7 kg)   BP Readings from Last 3 Encounters:  08/28/22 (!) 140/52  07/31/22 118/62  06/29/22 126/64   Pulse Readings from Last 3 Encounters:  08/28/22 (!) 45  07/31/22 (!) 55  06/29/22 (!) 51    Renal function: Estimated Creatinine Clearance: 89.5 mL/min (by C-G formula based on SCr of 0.75 mg/dL).  Past Medical History:  Diagnosis Date   A-fib (HCC)    Allergy    Seasonal--pollen   Arrhythmia    CAD (coronary artery disease) 1999   RCA stent   Carotid stenosis, asymptomatic    moderate RT and mild LT with carotid dopplers 10/26/11   Colon cancer (HCC) 04/2006   Stage III--T3 N1   COPD (chronic obstructive pulmonary disease) (HCC)    H/O cardiovascular stress test 01/13/2010   low risk scan,  similar to 2008 study   H/O echocardiogram 05/23/2009   EF >55%, mild MR, Mild TR,    Hand foot syndrome    Hemorrhoids    Hyperlipidemia    Hypertension    Neuropathy    Permanent atrial fibrillation (HCC)    Psoriasis    Tubular adenoma of colon     Current Outpatient Medications on File Prior to Visit  Medication Sig Dispense Refill   Alpha-Lipoic Acid 600 MG TABS Take 1,200 mg by mouth daily.     aspirin 81 MG chewable tablet Chew 81 mg by mouth daily.     atorvastatin (LIPITOR) 80 MG tablet TAKE 1 TABLET AT 6PM. (Patient taking differently: Take 80 mg by mouth daily.) 90 tablet 3   cholecalciferol (VITAMIN D) 1000 UNITS tablet Take 1,000 Units by mouth daily.     clopidogrel (PLAVIX) 75 MG tablet Take 1 tablet (75 mg total) by mouth daily with breakfast. 90 tablet 3   Coenzyme Q10 200 MG capsule Take 200 mg by mouth daily.      Cyanocobalamin (VITAMIN B 12 PO) Take 200 mcg by mouth daily.     ELIQUIS 5 MG  TABS tablet TAKE 1 TABLET BY MOUTH TWICE  DAILY 180 tablet 3   Flaxseed, Linseed, 1000 MG CAPS Take 1,000 mg by mouth daily.     furosemide (LASIX) 40 MG tablet Take 1 tablet (40 mg total) by mouth daily. Please note change in dose from twice a day to once a day starting on 09/01/2022. 180 tablet 3   Magnesium Citrate 200 MG TABS Take 400 mg by mouth daily in the afternoon.     metolazone (ZAROXOLYN) 2.5 MG tablet Take 1 tablet (2.5 mg total) by mouth 2 (two) times a week. Every Mon and Fri     Polyethyl Glycol-Propyl Glycol (SYSTANE) 0.4-0.3 % GEL ophthalmic gel Place 1 application. into both eyes every 8 (eight) hours as needed (dry eyes).     potassium chloride (KLOR-CON M) 20 MEQ tablet Take 1 tablet (20 mEq total) by mouth daily. 30 tablet 0   pyridoxine (B-6) 200 MG tablet Take 200 mg by mouth daily.     vitamin C (ASCORBIC ACID) 500 MG tablet Take 500 mg by mouth 3 (three) times daily.      [DISCONTINUED] lisinopril (PRINIVIL,ZESTRIL) 20 MG tablet Take 1 tablet (20 mg  total) by mouth daily. HOLD UNTIL YOUR APPT WITH DR Cecelia Byars (Patient not taking: No sig reported) 30 tablet 1   No current facility-administered medications on file prior to visit.    No Known Allergies   Assessment/Plan:  1. Hypertension -  Patient BP elevated in room today at 163/62 with pulse of 41 bpm and patient feels poorly. Has SOB walking from waiting room to exam room. Discussed with Dr Allyson Sabal. Will increase furosemide back to 40mg  BID and increase his metolazone to 2.5mg  three times weekly.  Would also likely benefit from [place]. Will give 2 weeks of samples and complete PA. 14 day Zio patch ordered and appointment scheduled next week with APP.  Increase furosemide to 80mg  BID Increase metolazone to 2.5mg  MWF Start Marcelline Deist 10mg  daily Start 14 day Zio patch F/U next week with Waynard Reeds, PharmD, BCACP, CDCES, CPP [address], [address] [place], [place], [address] Phone: [phone], Fax: [phone]

## 2022-09-07 NOTE — Patient Instructions (Addendum)
It was nice meeting you today  Please increase your furosemide to 40mg  twice daily  Please increase your metolazone to 2.5mg  Monday, Wednesday, and Friday  I have given you some Farxiga 10mg  once a day in the morning  We will schedule you next week with Dr Hazle Coca assistant  Laural Golden, PharmD, BCACP, CDCES, CPP [address], [address] [place], [place], [address] Phone: [phone], Fax: [phone]     Christena Deem- Long Term Monitor Instructions  Your physician has requested you wear a ZIO patch monitor for 14 days.  This is a single patch monitor. [employer] supplies one patch monitor per enrollment. Additional stickers are not available. Please do not apply patch if you will be having a Nuclear Stress Test,  Echocardiogram, Cardiac CT, MRI, or Chest Xray during the period you would be wearing the  monitor. The patch cannot be worn during these tests. You cannot remove and re-apply the  ZIO XT patch monitor.  Your ZIO patch monitor will be mailed 3 day USPS to your address on file. It may take 3-5 days  to receive your monitor after you have been enrolled.  Once you have received your monitor, please review the enclosed instructions. Your monitor  has already been registered assigning a specific monitor serial # to you.  Billing and Patient Assistance Program Information  We have supplied [employer] with any of your insurance information on file for billing purposes. [employer] offers a sliding scale Patient Assistance Program for patients that do not have  insurance, or whose insurance does not completely cover the cost of the ZIO monitor.  You must apply for the Patient Assistance Program to qualify for this discounted rate.  To apply, please call [employer] at [phone], select option 4, select option 2, ask to apply for  Patient Assistance Program. Meredeth Ide will ask your household income, and how many people  are in your household. They will quote your out-of-pocket cost based on  that information.  [employer] will also be able to set up a 85-month, interest-free payment plan if needed.  Applying the monitor   Shave hair from upper left chest.  Hold abrader disc by orange tab. Rub abrader in 40 strokes over the upper left chest as  indicated in your monitor instructions.  Clean area with 4 enclosed alcohol pads. Let dry.  Apply patch as indicated in monitor instructions. Patch will be placed under collarbone on left  side of chest with arrow pointing upward.  Rub patch adhesive wings for 2 minutes. Remove white label marked "1". Remove the white  label marked "2". Rub patch adhesive wings for 2 additional minutes.  While looking in a mirror, press and release button in center of patch. A small green light will  flash 3-4 times. This will be your only indicator that the monitor has been turned on.  Do not shower for the first 24 hours. You may shower after the first 24 hours.  Press the button if you feel a symptom. You will hear a small click. Record Date, Time and  Symptom in the Patient Logbook.  When you are ready to remove the patch, follow instructions on the last 2 pages of Patient  Logbook. Stick patch monitor onto the last page of Patient Logbook.  Place Patient Logbook in the blue and white box. Use locking tab on box and tape box closed  securely. The blue and white box has prepaid postage on it. Please place it in the mailbox as  soon as  possible. Your physician should have your test results approximately 7 days after the  monitor has been mailed back to [facility]  Call [facility] Customer Care at [phone] if you have questions regarding  your ZIO XT patch monitor. Call them immediately if you see an orange light blinking on your  monitor.  If your monitor falls off in less than 4 days, contact our Monitor department at [phone].  If your monitor becomes loose or falls off after 4 days call [employer] at [phone] for  suggestions on  securing your monitor

## 2022-09-07 NOTE — Telephone Encounter (Signed)
Please complete PA for Farxiga 10mg 

## 2022-09-10 ENCOUNTER — Telehealth: Payer: Self-pay

## 2022-09-10 ENCOUNTER — Other Ambulatory Visit (HOSPITAL_COMMUNITY): Payer: Self-pay

## 2022-09-10 ENCOUNTER — Ambulatory Visit: Payer: Medicare Other | Attending: Cardiovascular Disease

## 2022-09-10 DIAGNOSIS — I10 Essential (primary) hypertension: Secondary | ICD-10-CM

## 2022-09-10 DIAGNOSIS — I482 Chronic atrial fibrillation, unspecified: Secondary | ICD-10-CM | POA: Diagnosis not present

## 2022-09-10 DIAGNOSIS — Z9989 Dependence on other enabling machines and devices: Secondary | ICD-10-CM | POA: Diagnosis not present

## 2022-09-10 DIAGNOSIS — I272 Pulmonary hypertension, unspecified: Secondary | ICD-10-CM | POA: Diagnosis not present

## 2022-09-10 DIAGNOSIS — I25118 Atherosclerotic heart disease of native coronary artery with other forms of angina pectoris: Secondary | ICD-10-CM | POA: Diagnosis not present

## 2022-09-10 DIAGNOSIS — D6869 Other thrombophilia: Secondary | ICD-10-CM | POA: Diagnosis not present

## 2022-09-10 DIAGNOSIS — I4891 Unspecified atrial fibrillation: Secondary | ICD-10-CM

## 2022-09-10 DIAGNOSIS — I739 Peripheral vascular disease, unspecified: Secondary | ICD-10-CM | POA: Diagnosis not present

## 2022-09-10 NOTE — Progress Notes (Unsigned)
Enrolled for [employer] to mail a ZIO XT long term holter monitor to the patients address on file.  

## 2022-09-10 NOTE — Telephone Encounter (Signed)
   PER TEST CLAIM: NO P/A REQ'D FOR FARXIGA 10MG

## 2022-09-11 ENCOUNTER — Ambulatory Visit: Payer: Medicare Other | Attending: Nurse Practitioner | Admitting: Nurse Practitioner

## 2022-09-11 ENCOUNTER — Encounter: Payer: Self-pay | Admitting: Nurse Practitioner

## 2022-09-11 VITALS — BP 122/58 | HR 39 | Ht 74.0 in | Wt 210.0 lb

## 2022-09-11 DIAGNOSIS — I739 Peripheral vascular disease, unspecified: Secondary | ICD-10-CM | POA: Diagnosis not present

## 2022-09-11 DIAGNOSIS — R55 Syncope and collapse: Secondary | ICD-10-CM | POA: Diagnosis not present

## 2022-09-11 DIAGNOSIS — I2510 Atherosclerotic heart disease of native coronary artery without angina pectoris: Secondary | ICD-10-CM

## 2022-09-11 DIAGNOSIS — R0602 Shortness of breath: Secondary | ICD-10-CM | POA: Diagnosis not present

## 2022-09-11 DIAGNOSIS — G4733 Obstructive sleep apnea (adult) (pediatric): Secondary | ICD-10-CM | POA: Diagnosis not present

## 2022-09-11 DIAGNOSIS — I272 Pulmonary hypertension, unspecified: Secondary | ICD-10-CM

## 2022-09-11 DIAGNOSIS — I4891 Unspecified atrial fibrillation: Secondary | ICD-10-CM

## 2022-09-11 LAB — BASIC METABOLIC PANEL

## 2022-09-11 LAB — CBC
Hemoglobin: 13.9 g/dL (ref 13.0–17.7)
MCH: 31 pg (ref 26.6–33.0)
MCHC: 33.8 g/dL (ref 31.5–35.7)

## 2022-09-11 NOTE — Patient Instructions (Signed)
Medication Instructions:  Your physician recommends that you continue on your current medications as directed. Please refer to the Current Medication list given to you today.  *If you need a refill on your cardiac medications before your next appointment, please call your pharmacy*   Lab Work: BMET, CBC, BNP  Testing/Procedures: NONE ordered at this time of appointment     Follow-Up: At [facility], you and your health needs are our priority.  As part of our continuing mission to provide you with exceptional heart care, we have created designated Provider Care Teams.  These Care Teams include your primary Cardiologist (physician) and Advanced Practice Providers (APPs -  Physician Assistants and Nurse Practitioners) who all work together to provide you with the care you need, when you need it.  We recommend signing up for the patient portal called "MyChart".  Sign up information is provided on this After Visit Summary.  MyChart is used to connect with patients for Virtual Visits (Telemedicine).  Patients are able to view lab/test results, encounter notes, upcoming appointments, etc.  Non-urgent messages can be sent to your provider as well.   To learn more about what you can do with MyChart, go to [email or web address].    Your next appointment:    Keep follow up   Provider:   Nanetta Batty, MD     Other Instructions

## 2022-09-11 NOTE — Progress Notes (Signed)
Office Visit    Patient Name: [name] Date of Encounter: 09/11/2022  Primary Care Provider:  Thana Ates, MD Primary Cardiologist:  Nanetta Batty, MD  Chief Complaint    82 year old male with a history of CAD s/p RCA stent in 1999, CABG x 4 in 2019, chronic atrial fibrillation with slow ventricular rate, severe TR, pulmonary hypertension, PAD s/p orbital atherectomy/VBX stenting to bilateral iliac arteries, colon cancer s/p right colectomy, OSA and COPD who presents for follow-up related to CAD and atrial fibrillation.  Past Medical History    Past Medical History:  Diagnosis Date   A-fib (HCC)    Allergy    Seasonal--pollen   Arrhythmia    CAD (coronary artery disease) 1999   RCA stent   Carotid stenosis, asymptomatic    moderate RT and mild LT with carotid dopplers 10/26/11   Colon cancer (HCC) 04/2006   Stage III--T3 N1   COPD (chronic obstructive pulmonary disease) (HCC)    H/O cardiovascular stress test 01/13/2010   low risk scan, similar to 2008 study   H/O echocardiogram 05/23/2009   EF >55%, mild MR, Mild TR,    Hand foot syndrome    Hemorrhoids    Hyperlipidemia    Hypertension    Neuropathy    Permanent atrial fibrillation (HCC)    Psoriasis    Tubular adenoma of colon    Past Surgical History:  Procedure Laterality Date   ABDOMINAL AORTOGRAM W/LOWER EXTREMITY N/A 12/21/2021   Procedure: ABDOMINAL AORTOGRAM W/LOWER EXTREMITY;  Surgeon: [name], MD;  Location: [facility];  Service: Cardiovascular;  Laterality: N/A;   CARDIAC CATHETERIZATION     CLIPPING OF ATRIAL APPENDAGE  01/28/2018   Procedure: CLIPPING OF ATRIAL APPENDAGE;  Surgeon: Alleen Borne, MD;  Location: [facility] OR;  Service: Open Heart Surgery;;   COLONOSCOPY     CORONARY ARTERY BYPASS GRAFT N/A 01/28/2018   Procedure: CORONARY ARTERY BYPASS GRAFTING (CABG) X 4 USING LEFT INTERNAL MAMMARY ARTERY AND LEFT GREATER SAPHENOUS VEIN HARVESTED ENDOSCOPICALLY;  Surgeon: Alleen Borne, MD;  Location: [facility] OR;  Service: Open Heart Surgery;  Laterality: N/A;   CORONARY STENT PLACEMENT  1999   RCA tandem NIR stents   LEFT HEART CATH AND CORONARY ANGIOGRAPHY N/A 01/23/2018   Procedure: LEFT HEART CATH AND CORONARY ANGIOGRAPHY;  Surgeon: Yvonne Kendall, MD;  Location: [facility];  Service: Cardiovascular;  Laterality: N/A;   PERIPHERAL VASCULAR ATHERECTOMY Bilateral 12/21/2021   Procedure: PERIPHERAL VASCULAR ATHERECTOMY;  Surgeon: [name], MD;  Location: [facility];  Service: Cardiovascular;  Laterality: Bilateral;  common illiac   PERIPHERAL VASCULAR INTERVENTION Bilateral 12/21/2021   Procedure: PERIPHERAL VASCULAR INTERVENTION;  Surgeon: [name], MD;  Location: [facility];  Service: Cardiovascular;  Laterality: Bilateral;  Common Illiacs   RIGHT COLECTOMY  05/29/2006   Laparoscopic assisted   RIGHT HEART CATH N/A 06/14/2021   Procedure: RIGHT HEART CATH;  Surgeon: Dolores Patty, MD;  Location: [facility];  Service: Cardiovascular;  Laterality: N/A;   TEE WITHOUT CARDIOVERSION N/A 01/28/2018   Procedure: TRANSESOPHAGEAL ECHOCARDIOGRAM (TEE);  Surgeon: Alleen Borne, MD;  Location: [facility] OR;  Service: Open Heart Surgery;  Laterality: N/A;   TONSILLECTOMY      Allergies  No Known Allergies   Labs/Other Studies Reviewed    The following studies were reviewed today:  Cardiac Studies & Procedures   CARDIAC CATHETERIZATION  CARDIAC CATHETERIZATION 06/14/2021  Narrative Findings:  RA = 12 (v- waves to 23) RV = 40/14 PA = 43/11 (26) PCW = 14 (v- waves up to 35) Fick cardiac output/index = 6.6/2.9 PVR = 1.8 WU Ao sat = 93% PA sat = 64%, 69% PAPi = 2.7  Assessment: 1. Mild pulmonary HTN with normal PVR and normal cardiac output 2. Prominent v-waves in bot RA and PCWP tracings suggestive of significant TR/MR  Plan/Discussion:  Suspect PH related to valvular disease. Will need TEE to further evaluate.  There is near equalization of pressures but suspicion for post-op constriction, not overly high. cMRI pending.  Arvilla Meres, MD 12:31 PM   CARDIAC CATHETERIZATION  CARDIAC CATHETERIZATION 01/23/2018  Narrative Conclusions: 1. Severe multivessel coronary artery disease, including calcified 90% stenoses involving the LMCA, LAD, and RCA. 2. Patent stent in the mid RCA; 90% mid RCA stenosis is located just proximal to the stent. 3. Normal left ventricular systolic function and filling pressure.  Recommendations: 1. Inpatient cardiac surgery consultation for CABG. 2. Restart heparin infusion 2 hours after deflation of TR band. 3. Aggressive secondary prevention.  Recommend to resume apixaban following CABG.  Recommend concurrent antiplatelet therapy of aspirin 81mg  daily for at least 12 months.  Yvonne Kendall, MD [facility] Pager: [phone]  Findings Coronary Findings Diagnostic  Dominance: Right  Left Main Vessel is large. Mid LM lesion is 90% stenosed. The lesion is eccentric. The lesion is severely calcified.  Left Anterior Descending Mid LAD-1 lesion is 90% stenosed. Mid LAD-2 lesion is 70% stenosed. Dist LAD lesion is 80% stenosed.  First Diagonal Branch Vessel is small in size.  Second Diagonal Branch Vessel is moderate in size.  Third Diagonal Branch Vessel is small in size.  Left Circumflex Vessel is moderate in size.  First Obtuse Marginal Branch Vessel is small in size.  Second Obtuse Marginal Branch Vessel is small in size.  Right Coronary Artery Vessel is large. Prox RCA lesion is 30% stenosed. Prox RCA to Mid RCA lesion is 30% stenosed. The lesion is calcified. Mid RCA-1 lesion is 90% stenosed. The lesion is eccentric. The lesion is calcified. Previously placed Mid RCA-2 stent (unknown type) is widely patent. Dist RCA lesion is 50% stenosed. The lesion is eccentric.  Right Posterior Descending Artery Vessel is moderate in  size.  Right Posterior Atrioventricular Artery Vessel is large in size.  Intervention  No interventions have been documented.   STRESS TESTS  NM MYOCAR MULTI W/SPECT W 11/20/2012   ECHOCARDIOGRAM  ECHOCARDIOGRAM COMPLETE 08/27/2022  Narrative ECHOCARDIOGRAM REPORT    Patient Name:   [name] Date of Exam: 08/27/2022 Medical Rec #:  [id number]        Height:       75.0 in Accession #:    [id number]       Weight:       209.9 lb Date of Birth:  Aug 10, 1940       BSA:          2.240 m Patient Age:    81 years         BP:           145/61 mmHg Patient Gender: M                HR:           45 bpm. Exam Location:  Inpatient  Procedure: 2D Echo, Cardiac Doppler and Color Doppler  Indications:    syncope  History:  Patient has prior history of Echocardiogram examinations, most recent 12/20/2020. Prior CABG, Pulmonary HTN and PAD, Arrythmias:Bradycardia and Atrial Fibrillation, Signs/Symptoms:Syncope and Edema; Risk Factors:Sleep Apnea and Hypertension.  Sonographer:    Delcie Roch RDCS Referring Phys: [id number] TIMOTHY S OPYD  IMPRESSIONS   1. Patient bradycardic with HR 30-low 40s during exam. 2. Left ventricular ejection fraction, by estimation, is 55 to 60%. The left ventricle has normal function. The left ventricle has no regional wall motion abnormalities. There is mild left ventricular hypertrophy. Left ventricular diastolic parameters are indeterminate. 3. Right ventricular systolic function is mildly reduced. The right ventricular size is mildly enlarged. There is moderately elevated pulmonary artery systolic pressure. The estimated right ventricular systolic pressure is 53.9 mmHg. 4. Left atrial size was moderately dilated. 5. Right atrial size was severely dilated. 6. The mitral valve is degenerative. Trivial mitral valve regurgitation. 7. Tricuspid valve regurgitation is moderate. 8. The aortic valve is tricuspid. There is mild calcification of the  aortic valve. There is mild thickening of the aortic valve. Aortic valve regurgitation is not visualized. Aortic valve sclerosis/calcification is present, without any evidence of aortic stenosis. 9. Aortic dilatation noted. There is borderline dilatation of the aortic root, measuring 40 mm. 10. The inferior vena cava is dilated in size with <50% respiratory variability, suggesting right atrial pressure of 15 mmHg.  Comparison(s): Compared to prior TTE in 12/2020, the patient is now bradycardic with HR 30-40s. Hyperdynamic LV function is no longer present but EF still within normal range.  FINDINGS Left Ventricle: Left ventricular ejection fraction, by estimation, is 55 to 60%. The left ventricle has normal function. The left ventricle has no regional wall motion abnormalities. The left ventricular internal cavity size was normal in size. There is mild left ventricular hypertrophy. Abnormal (paradoxical) septal motion consistent with post-operative status. Left ventricular diastolic parameters are indeterminate.  Right Ventricle: The right ventricular size is mildly enlarged. No increase in right ventricular wall thickness. Right ventricular systolic function is mildly reduced. There is moderately elevated pulmonary artery systolic pressure. The tricuspid regurgitant velocity is 3.12 m/s, and with an assumed right atrial pressure of 15 mmHg, the estimated right ventricular systolic pressure is 53.9 mmHg.  Left Atrium: Left atrial size was moderately dilated.  Right Atrium: Right atrial size was severely dilated.  Pericardium: There is no evidence of pericardial effusion.  Mitral Valve: The mitral valve is degenerative in appearance. There is mild thickening of the mitral valve leaflet(s). There is mild calcification of the mitral valve leaflet(s). Mild mitral annular calcification. Trivial mitral valve regurgitation.  Tricuspid Valve: The tricuspid valve is normal in structure. Tricuspid valve  regurgitation is moderate.  Aortic Valve: The aortic valve is tricuspid. There is mild calcification of the aortic valve. There is mild thickening of the aortic valve. Aortic valve regurgitation is not visualized. Aortic valve sclerosis/calcification is present, without any evidence of aortic stenosis.  Pulmonic Valve: The pulmonic valve was normal in structure. Pulmonic valve regurgitation is trivial.  Aorta: Aortic dilatation noted. There is borderline dilatation of the aortic root, measuring 40 mm.  Venous: The inferior vena cava is dilated in size with less than 50% respiratory variability, suggesting right atrial pressure of 15 mmHg.  IAS/Shunts: The atrial septum is grossly normal.   LEFT VENTRICLE PLAX 2D LVIDd:         4.30 cm   Diastology LVIDs:         3.20 cm   LV e' medial:    5.22 cm/s LV  PW:         1.30 cm   LV E/e' medial:  25.9 LV IVS:        1.30 cm   LV e' lateral:   12.30 cm/s LVOT diam:     2.20 cm   LV E/e' lateral: 11.0 LV SV:         102 LV SV Index:   45 LVOT Area:     3.80 cm   RIGHT VENTRICLE            IVC RV Basal diam:  4.00 cm    IVC diam: 3.00 cm RV S prime:     9.14 cm/s TAPSE (M-mode): 1.8 cm  LEFT ATRIUM              Index        RIGHT ATRIUM           Index LA diam:        4.40 cm  1.96 cm/m   RA Area:     30.70 cm LA Vol (A2C):   96.5 ml  43.08 ml/m  RA Volume:   109.00 ml 48.66 ml/m LA Vol (A4C):   102.0 ml 45.54 ml/m LA Biplane Vol: 103.0 ml 45.99 ml/m AORTIC VALVE LVOT Vmax:   107.00 cm/s LVOT Vmean:  70.200 cm/s LVOT VTI:    0.268 m  AORTA Ao Root diam: 3.30 cm Ao Asc diam:  3.70 cm  MITRAL VALVE                TRICUSPID VALVE MV Area (PHT): 3.27 cm     TR Peak grad:   38.9 mmHg MV Decel Time: 232 msec     TR Vmax:        312.00 cm/s MV E velocity: 135.00 cm/s SHUNTS Systemic VTI:  0.27 m Systemic Diam: 2.20 cm  Laurance Flatten MD Electronically signed by Laurance Flatten MD Signature Date/Time:  08/27/2022/11:53:55 AM    Final   TEE  ECHO TEE 01/30/2018  Interpretation Summary  Septum: No Patent Foramen Ovale present.  Left atrium: Patent foramen ovale not present.  Aortic valve: The valve is trileaflet. Mild valve thickening present. No stenosis. No regurgitation.  Mitral valve: Mild leaflet thickening is present.  Right ventricle: Normal cavity size, wall thickness and ejection fraction.   MONITORS  LONG TERM MONITOR (3-14 DAYS) 07/30/2022  Narrative Patch Wear Time:  13 days and 23 hours (2024-04-12T16:23:55-0400 to 2024-04-26T16:23:47-0400)  1. Atrial Fibrillation occurred continuously (100% burden), ranging from 31-114 bpm (avg of 51 bpm). 2. Bundle Branch Block/IVCD was present. 3. Two runs of nonsustained Ventricular Tachycardia occurred, the run with the fastest interval lasting 8 beats with a max rate of 179 bpm, the longest lasting 8 beats with an avg rate of 112 bpm. 4. Occasional PVCs (1.7%, 16285). 5. Ventricular Bigeminy was present.  Arvilla Meres, MD 1:35 PM    CARDIAC MRI  MR CARDIAC MORPHOLOGY W WO CONTRAST 02/06/2022  Narrative CLINICAL DATA:  R/O Amyloid  EXAM: CARDIAC MRI  TECHNIQUE: The patient was scanned on a 1.5 Tesla Siemens magnet. A dedicated cardiac coil was used. Functional imaging was done using Fiesta sequences. 2,3, and 4 chamber views were done to assess for RWMA's. Modified Simpson's rule using a short axis stack was used to calculate an ejection fraction on a dedicated work [employer]. The patient received 13 cc of Gadavist. After 10 minutes inversion recovery sequences were used to assess for infiltration and scar tissue.  CONTRAST:  [id number]  FINDINGS: Severe RAE. Moderate LAE. 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 Normal appearing MV trivial MR. AV is tri leaflet with no significant AR or restriction to  motion. Normal PV/TV with normal RVOT Normal LV size and function Mild LVH 13 mm septum. Quantitative EF 71% (EDV 184 ESV 53 cc SV 131 cc) Inversion recovery sequences with gadolinium showed no scar, infarct or infiltration No evidence of amyloid. Normal RV size and function Quantitative RVEF 55% (EDV 244 cc ESV 110 cc SV 134 cc )  T1 1019 msec  ECV Using Hct of 37 27%  T2 normal 47 msec  CO using flow analysis through the AV equal to 5.1 L/min  IMPRESSION: 1.  Severe RAE, moderate LAE  2.  Mild LVH normal LVEF 71% with no RWMAls  3. Normal post gadolinium images no evidence of amyloid or infiltrative disease  4.  Normal parametric measures see above  5.  Normal CO 5.1 L/min  6.  Normal RV size and function RVEF 55%  7.  Moderate ascending thoracic dilatation 4.2 cm  Charlton Haws   Electronically Signed By: Charlton Haws M.D. On: 02/06/2022 17:03        Recent Labs: 06/29/2022: ALT 54 08/25/2022: B Natriuretic Peptide 61.9 08/26/2022: Magnesium 2.2; TSH 2.798 08/28/2022: BUN 20; Creatinine, Ser 0.75; Hemoglobin 12.7; Platelets 137; Potassium 4.3; Sodium 132  Recent Lipid Panel    Component Value Date/Time   CHOL 99 12/22/2021 0144   TRIG 59 12/22/2021 0144   HDL 38 (L) 12/22/2021 0144   CHOLHDL 2.6 12/22/2021 0144   VLDL 12 12/22/2021 0144   LDLCALC 49 12/22/2021 0144    History of Present Illness    82 year old male with the above past medical history including CAD s/p RCA stent in 1999, CABG x 4 in 2019, chronic atrial fibrillation with slow ventricular rate, severe TR, pulmonary hypertension, PAD s/p orbital atherectomy/VBX stenting to bilateral iliac arteries, colon cancer s/p right colectomy, OSA and COPD.  He has a history of CAD s/p stenting-RCA in 1999. Cardiac catheterization in 2019 in the setting of NSTEMI revealed multivessel CAD, including left main disease.  He underwent CABG x 4 (LIMA-LAD, SVG-OM, SVG-PDA/PLA) in 2019.  Echocardiogram in 12/2020  revealed severe TR, pulmonary hypertension.  Right heart cath in 05/2021 showed mild pulmonary hypertension with V waves in both the right atrium and wedge positions suggesting MR and TR.  Cardiac MRI was negative for amyloid or sarcoid.  He also has a history of severe PAD with bilateral iliac and right SFA disease s/p orbital atherectomy/VBX stenting to bilateral iliac arteries in 12/2021.  Additionally he has a history of chronic atrial fibrillation on Eliquis with slow ventricular response.  He has been evaluated by EP.  Ongoing monitoring of heart rate/symptoms was advised.  He was hospitalized from 08/25/2022-08/28/2022 in the setting of near syncope. Echocardiogram was essentially normal.  It was felt that he was likely hypovolemic. Spironolactone was discontinued, furosemide was reduced to 40 mg daily.  He was bradycardic.  EP was consulted who felt that his slow heart rate was not causing any significant symptoms.  Pacemaker was deferred.  He was last seen in the office 09/07/2022 by hypertension clinic Pharm.D.  He felt unwell.  He noted shortness of breath, elevated BP.  Lasix was increased to 40 mg twice daily, metolazone was increased to 2.5 mg 3 times weekly.  He was started on Farxiga.  Given baseline bradycardia, 14 day Zio patch was ordered and is pending.  He presents today for follow-up.  Since his last visit he has been stable overall from a cardiac standpoint.  His edema has improved with escalation of diuretic therapy/increased metolazone.  He still has some mild nonpitting bilateral lower extremity edema, he is no longer short of breath, weight is down.  He notes intermittent dizziness/lightheadedness.  He denies any chest pain, palpitations, presyncope or syncope. He notes he does not sleep well and has not slept well for years.  He has follow-up scheduled with Dr. Mayford Knife in August 2024.  Overall, he has improved significantly since his visit last week.   Home Medications    Current  Outpatient Medications  Medication Sig Dispense Refill   Alpha-Lipoic Acid 600 MG TABS Take 1,200 mg by mouth daily.     aspirin 81 MG chewable tablet Chew 81 mg by mouth daily.     atorvastatin (LIPITOR) 80 MG tablet TAKE 1 TABLET AT 6PM. (Patient taking differently: Take 80 mg by mouth daily.) 90 tablet 3   cholecalciferol (VITAMIN D) 1000 UNITS tablet Take 1,000 Units by mouth daily.     clopidogrel (PLAVIX) 75 MG tablet Take 1 tablet (75 mg total) by mouth daily with breakfast. 90 tablet 3   Coenzyme Q10 200 MG capsule Take 200 mg by mouth daily.      Cyanocobalamin (VITAMIN B 12 PO) Take 200 mcg by mouth daily.     dapagliflozin propanediol (FARXIGA) 10 MG TABS tablet Take 1 tablet (10 mg total) by mouth daily. 14 tablet 0   ELIQUIS 5 MG TABS tablet TAKE 1 TABLET BY MOUTH TWICE  DAILY 180 tablet 3   Flaxseed, Linseed, 1000 MG CAPS Take 1,000 mg by mouth daily.     furosemide (LASIX) 40 MG tablet Take 1 tablet (40 mg total) by mouth 2 (two) times daily. 60 tablet 2   Magnesium Citrate 200 MG TABS Take 400 mg by mouth daily in the afternoon.     metolazone (ZAROXOLYN) 2.5 MG tablet Take 1 tablet by mouth on Monday, Wednesday, and Friday 30 tablet 2   Polyethyl Glycol-Propyl Glycol (SYSTANE) 0.4-0.3 % GEL ophthalmic gel Place 1 application. into both eyes every 8 (eight) hours as needed (dry eyes).     potassium chloride (KLOR-CON M) 20 MEQ tablet Take 1 tablet (20 mEq total) by mouth daily. 30 tablet 0   pyridoxine (B-6) 200 MG tablet Take 200 mg by mouth daily.     vitamin C (ASCORBIC ACID) 500 MG tablet Take 500 mg by mouth 3 (three) times daily.      No current facility-administered medications for this visit.     Review of Systems    He denies chest pain, palpitations, dyspnea, pnd, orthopnea, n, v, syncope, weight gain, or early satiety. All other systems reviewed and are otherwise negative except as noted above.   Physical Exam    VS:  BP (!) 122/58   Pulse (!) 39   Ht 6\' 2"   (1.88 m)   Wt 210 lb (95.3 kg)   SpO2 97%   BMI 26.96 kg/m  GEN: Well nourished, well developed, in no acute distress. HEENT: normal. Neck: Supple, no JVD, carotid bruits, or masses. Cardiac: RRR, no murmurs, rubs, or gallops. No clubbing, cyanosis, edema.  Radials/DP/PT 2+ and equal bilaterally.  Respiratory:  Respirations regular and unlabored, clear to auscultation bilaterally. GI: Soft, nontender, nondistended, BS + x 4. MS: no deformity or atrophy.  Skin: warm and dry, no rash. Neuro:  Strength and sensation are intact. Psych: Normal affect.  Accessory Clinical Findings    ECG personally reviewed by me today -    - no acute changes.   Lab Results  Component Value Date   WBC 7.5 08/28/2022   HGB 12.7 (L) 08/28/2022   HCT 37.4 (L) 08/28/2022   MCV 93.7 08/28/2022   PLT 137 (L) 08/28/2022   Lab Results  Component Value Date   CREATININE 0.75 08/28/2022   BUN 20 08/28/2022   NA 132 (L) 08/28/2022   K 4.3 08/28/2022   CL 102 08/28/2022   CO2 18 (L) 08/28/2022   Lab Results  Component Value Date   ALT 54 (H) 06/29/2022   AST 46 (H) 06/29/2022   ALKPHOS 87 06/29/2022   BILITOT 1.8 (H) 06/29/2022   Lab Results  Component Value Date   CHOL 99 12/22/2021   HDL 38 (L) 12/22/2021   LDLCALC 49 12/22/2021   TRIG 59 12/22/2021   CHOLHDL 2.6 12/22/2021    Lab Results  Component Value Date   HGBA1C 5.3 01/22/2018    Assessment & Plan   1. Presyncope: Likely multifactorial but felt to be largely due to hypovolemia.  Echocardiogram was essentially normal.  He does have baseline bradycardia.  Evaluated by EP who felt that pacemaker was not indicated.  Denies any recurrent syncope though he does note ongoing dizziness.  14-day ZIO pending as below.  Continue to monitor symptoms.  Discussed ED precautions.  2. Atrial fibrillation with slow VR: EKG today shows atrial fibrillation, 39 bpm.  He has been evaluated by EP.  He is currently pending 14-day ZIO monitor in the  setting of dizziness, near syncope.  Denies syncope.  Pending results, consider follow-up with EP.  Discussed ED precautions.  Continue Eliquis.  3. CAD: S/p stenting-RCA in 1999. Cardiac catheterization in 2019 in the setting of NSTEMI revealed multivessel CAD, including left main disease.  He underwent CABG x 4 (LIMA-LAD, SVG-OM, SVG-PDA/PLA) in 2019.  Stable with no anginal symptoms.  No indication for ischemic evaluation.  Continue aspirin, Plavix, lisinopril, Lipitor.  4. Pulmonary hypertension/severe TR: Right heart cath in 05/2021 showed mild pulmonary hypertension with V waves in both the right atrium and wedge positions suggesting MR and TR.  Cardiac MRI was negative for amyloid or sarcoid.  Lasix was recently decreased and spironolactone was discontinued in the setting of near syncope, possible hypovolemia.  He subsequently experienced symptoms of fluid volume overload with increased lower extremity edema, shortness of breath. Lasix was increased to 40 mg twice daily, metolazone was increased to 2.5 mg 3 times weekly.  He was started on Farxiga.  Weight is down 10 pounds.  He has nonpitting bilateral lower extremity edema, which she states is much improved, otherwise generally euvolemic, and well compensated on exam.  Will check BMET, CBC, BMP today.  Continue lisinopril, Farxiga, Lasix, and metolazone.  5. PAD:  H/o bilateral iliac and right SFA disease s/p orbital atherectomy/VBX stenting to bilateral iliac arteries in 12/2021.  Denies any significant claudication.  Managed per Dr. Allyson Sabal.  Continue Lipitor.   6. OSA: Adherent to BiPAP.  He notes he does not sleep well and has not slept well for years.  He has follow-up scheduled with Dr. Mayford Knife in 10/2022.  7. Disposition: Follow-up as scheduled with Dr. Allyson Sabal in 09/2022.       Joylene Grapes, NP 09/11/2022, 11:43 AM

## 2022-09-12 DIAGNOSIS — I5032 Chronic diastolic (congestive) heart failure: Secondary | ICD-10-CM | POA: Insufficient documentation

## 2022-09-12 DIAGNOSIS — I25118 Atherosclerotic heart disease of native coronary artery with other forms of angina pectoris: Secondary | ICD-10-CM | POA: Insufficient documentation

## 2022-09-12 LAB — BASIC METABOLIC PANEL
BUN/Creatinine Ratio: 19 (ref 10–24)
BUN: 19 mg/dL (ref 8–27)
CO2: 25 mmol/L (ref 20–29)
Calcium: 9.9 mg/dL (ref 8.6–10.2)
Chloride: 93 mmol/L — ABNORMAL LOW (ref 96–106)
Potassium: 4.5 mmol/L (ref 3.5–5.2)
Sodium: 135 mmol/L (ref 134–144)
eGFR: 77 mL/min/{1.73_m2} (ref >59)

## 2022-09-12 LAB — BRAIN NATRIURETIC PEPTIDE: BNP: 49.1 pg/mL (ref 0.0–100.0)

## 2022-09-12 LAB — CBC
Hematocrit: 41.1 % (ref 37.5–51.0)
MCV: 92 fL (ref 79–97)
Platelets: 203 10*3/uL (ref 150–450)
RBC: 4.48 x10E6/uL (ref 4.14–5.80)
RDW: 13.6 % (ref 11.6–15.4)
WBC: 9 10*3/uL (ref 3.4–10.8)

## 2022-09-12 MED ORDER — DAPAGLIFLOZIN PROPANEDIOL 10 MG PO TABS
10.0000 mg | ORAL_TABLET | Freq: Every day | ORAL | 5 refills | Status: DC
Start: 2022-09-12 — End: 2023-03-18

## 2022-09-12 NOTE — Telephone Encounter (Signed)
Called patient and advised [name] is covered and will send to pharmacy

## 2022-09-13 DIAGNOSIS — I4891 Unspecified atrial fibrillation: Secondary | ICD-10-CM

## 2022-09-13 DIAGNOSIS — I10 Essential (primary) hypertension: Secondary | ICD-10-CM

## 2022-09-14 ENCOUNTER — Telehealth: Payer: Self-pay

## 2022-09-14 NOTE — Telephone Encounter (Signed)
Spoke with pt. Pt was notified of lab results. Pt will continue current medication and f/u as planned.  

## 2022-09-17 ENCOUNTER — Ambulatory Visit: Payer: Medicare Other | Attending: Internal Medicine | Admitting: Physical Therapy

## 2022-09-17 ENCOUNTER — Encounter: Payer: Self-pay | Admitting: Physical Therapy

## 2022-09-17 ENCOUNTER — Other Ambulatory Visit: Payer: Self-pay

## 2022-09-17 DIAGNOSIS — R2689 Other abnormalities of gait and mobility: Secondary | ICD-10-CM | POA: Diagnosis not present

## 2022-09-17 DIAGNOSIS — M6281 Muscle weakness (generalized): Secondary | ICD-10-CM | POA: Diagnosis not present

## 2022-09-17 NOTE — Therapy (Signed)
OUTPATIENT PHYSICAL THERAPY EVALUATION   Patient Name: [name] MRN: [id number] DOB:Jul 16, 1940, 82 y.o., male Today's Date: 09/18/2022   END OF SESSION:  PT End of Session - 09/17/22 1533     Visit Number 1    Number of Visits 17    Date for PT Re-Evaluation 11/12/22    Authorization Type UHC MCR    Progress Note Due on Visit 10    PT Start Time 1530    PT Stop Time 1615    PT Time Calculation (min) 45 min    Equipment Utilized During Treatment Gait belt    Activity Tolerance Patient tolerated treatment well    Behavior During Therapy WFL for tasks assessed/performed             Past Medical History:  Diagnosis Date   A-fib (HCC)    Allergy    Seasonal--pollen   Arrhythmia    CAD (coronary artery disease) 1999   RCA stent   Carotid stenosis, asymptomatic    moderate RT and mild LT with carotid dopplers 10/26/11   Colon cancer (HCC) 04/2006   Stage III--T3 N1   COPD (chronic obstructive pulmonary disease) (HCC)    H/O cardiovascular stress test 01/13/2010   low risk scan, similar to 2008 study   H/O echocardiogram 05/23/2009   EF >55%, mild MR, Mild TR,    Hand foot syndrome    Hemorrhoids    Hyperlipidemia    Hypertension    Neuropathy    Permanent atrial fibrillation (HCC)    Psoriasis    Tubular adenoma of colon    Past Surgical History:  Procedure Laterality Date   ABDOMINAL AORTOGRAM W/LOWER EXTREMITY N/A 12/21/2021   Procedure: ABDOMINAL AORTOGRAM W/LOWER EXTREMITY;  Surgeon: [name], MD;  Location: [facility];  Service: Cardiovascular;  Laterality: N/A;   CARDIAC CATHETERIZATION     CLIPPING OF ATRIAL APPENDAGE  01/28/2018   Procedure: CLIPPING OF ATRIAL APPENDAGE;  Surgeon: Alleen Borne, MD;  Location: [facility];  Service: Open Heart Surgery;;   COLONOSCOPY     CORONARY ARTERY BYPASS GRAFT N/A 01/28/2018   Procedure: CORONARY ARTERY BYPASS GRAFTING (CABG) X 4 USING LEFT INTERNAL MAMMARY ARTERY AND LEFT GREATER SAPHENOUS VEIN  HARVESTED ENDOSCOPICALLY;  Surgeon: Alleen Borne, MD;  Location: [facility];  Service: Open Heart Surgery;  Laterality: N/A;   CORONARY STENT PLACEMENT  1999   RCA tandem NIR stents   LEFT HEART CATH AND CORONARY ANGIOGRAPHY N/A 01/23/2018   Procedure: LEFT HEART CATH AND CORONARY ANGIOGRAPHY;  Surgeon: Yvonne Kendall, MD;  [facility];  Service: Cardiovascular;  Laterality: N/A;   PERIPHERAL VASCULAR ATHERECTOMY Bilateral 12/21/2021   Procedure: PERIPHERAL VASCULAR ATHERECTOMY;  Surgeon: [name], MD;  [facility]: [facility];  Service: Cardiovascular;  Laterality: Bilateral;  common illiac   PERIPHERAL VASCULAR INTERVENTION Bilateral 12/21/2021   Procedure: PERIPHERAL VASCULAR INTERVENTION;  Surgeon: [name], MD;  [facility]: [facility];  Service: Cardiovascular;  Laterality: Bilateral;  Common Illiacs   RIGHT COLECTOMY  05/29/2006   Laparoscopic assisted   RIGHT HEART CATH N/A 06/14/2021   Procedure: RIGHT HEART CATH;  Surgeon: Dolores Patty, MD;  [facility]: [facility];  Service: Cardiovascular;  Laterality: N/A;   TEE WITHOUT CARDIOVERSION N/A 01/28/2018   Procedure: TRANSESOPHAGEAL ECHOCARDIOGRAM (TEE);  Surgeon: Alleen Borne, MD;  Location: [facility];  Service: Open Heart Surgery;  Laterality: N/A;   TONSILLECTOMY  Patient Active Problem List   Diagnosis Date Noted   Atherosclerotic heart disease of native coronary artery with other forms of angina pectoris (HCC) 09/12/2022   Chronic diastolic heart failure (HCC) 09/12/2022   Near syncope 08/26/2022   Hyponatremia 08/26/2022   Symptomatic bradycardia 08/26/2022   Claudication in peripheral vascular disease (HCC) 12/21/2021   Obstructive sleep apnea 07/04/2021   Peripheral arterial disease (HCC) 04/25/2021   Pulmonary hypertension, unspecified (HCC) 03/22/2021   Edema of leg 02/26/2018   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   NSTEMI (non-ST elevated myocardial infarction) (HCC) 01/21/2018    History of colon cancer 08/19/2013   Essential hypertension 11/20/2012   Dyslipidemia, goal LDL below 70 11/20/2012   Chronic anticoagulation    Carotid stenosis, asymptomatic    CAD S/P percutaneous coronary angioplasty    Atrial fibrillation with slow ventricular response (HCC) 06/02/2012    PCP: Thana Ates, MD  REFERRING PROVIDER: Osvaldo Shipper, MD  REFERRING DIAG: Muscle weakness, Other abnormalities of gait and mobility, Unsteadiness on feet, Difficulty in walking, not elsewhere classified  THERAPY DIAG:  Muscle weakness (generalized)  Other abnormalities of gait and mobility  Rationale for Evaluation and Treatment: Rehabilitation  ONSET DATE: Chronic, about 2 years    SUBJECTIVE:  SUBJECTIVE STATEMENT: Patient reports he has lost confidence in his walking and feels he is tentative with his walking and balance. He also reports he needs to build up his muscle mass because he has lost some strength. States he noticed losing strength about two years ago and came on gradually. He was going to the gym before the weakness set in, but he tried to go back and do the bike and treadmill but noticed a lot of fatigue. Currently he has trouble walking to the end of the driveway and back. He does try to walk about 5,000 steps per day around the house and in the yard. He would like to get back to the gym.  PERTINENT HISTORY: See cardiac history above  PAIN:  Are you having pain? No  PRECAUTIONS: Fall  WEIGHT BEARING RESTRICTIONS: No  FALLS:  Has patient fallen in last 6 months? No  LIVING ENVIRONMENT: Lives in: House/apartment Stairs: Yes: External: 1 steps; on left going up  PLOF: Independent  PATIENT GOALS: Get stronger and improve walking   OBJECTIVE:  PATIENT SURVEYS:  FOTO 52% functional status  COGNITION: Overall cognitive status: Within functional limits for tasks assessed     SENSATION: Appears intact  POSTURE:   Rounded shoulder posture  LOWER  EXTREMITY MMT:  MMT Right eval Left eval  Hip flexion 4 4  Hip extension 3 3  Hip abduction 3 3  Hip adduction    Hip internal rotation    Hip external rotation    Knee flexion 4+ 4+  Knee extension 4+ 4+  Ankle dorsiflexion    Ankle plantarflexion    Ankle inversion    Ankle eversion     (Blank rows = not tested)  FUNCTIONAL TESTS:  5 times sit to stand: 15 seconds Timed up and go (TUG): 12 seconds 6 minute walk test: 555 ft - 2 min rest break at 3 min  GAIT: Distance walked: 555 ft Assistive device utilized: None Level of assistance: Complete Independence Comments: Decreased gait speed and step length, occasional unsteadiness   TODAY'S TREATMENT:     [facility] Adult PT Treatment:  DATE: 09/17/2022 Therapeutic Exercise: Sit to stand x 10 Standing hip abduction and extension with yellow at knees x 10 each Standing heel raises x 15 Standing march x 20 Romberg with head turns and nods x 10 each Row with green x 15  PATIENT EDUCATION:  Education details: Exam findings, POC, HEP Person educated: Patient Education method: Explanation, Demonstration, Tactile cues, Verbal cues, and Handouts Education comprehension: verbalized understanding, returned demonstration, verbal cues required, tactile cues required, and needs further education  HOME EXERCISE PROGRAM: Access Code: [email or web address]    ASSESSMENT: CLINICAL IMPRESSION: Patient is a 82 y.o. male who was seen today for physical therapy evaluation and treatment for chronic weakness and lack of confidence with gait and balance. He does exhibit gross strength deficits of the LE and demonstrates limitations with mobility, gait, and endurance.    OBJECTIVE IMPAIRMENTS: Abnormal gait, decreased activity tolerance, decreased balance, decreased endurance, decreased strength, postural dysfunction, and pain.   ACTIVITY LIMITATIONS: lifting, squatting, and locomotion level  PARTICIPATION  LIMITATIONS: shopping and community activity  PERSONAL FACTORS: Fitness, Past/current experiences, and Time since onset of injury/illness/exacerbation are also affecting patient's functional outcome.   REHAB POTENTIAL: Good  CLINICAL DECISION MAKING: Stable/uncomplicated  EVALUATION COMPLEXITY: Low   GOALS: Goals reviewed with patient? Yes  SHORT TERM GOALS: Target date: 10/15/2022  Patient will be I with initial HEP in order to progress with therapy. Baseline: HEP provided at eval Goal status: INITIAL  2.  Patient will perform 5xSTS </= 12 seconds to indicate improved strength and reduced fall risk Baseline: 15 seconds Goal status: INITIAL  3.  Patient will perform TUG </= 9 seconds to indicate improved mobility and confidence with walking Baseline: 12 seconds Goal status: INITIAL  LONG TERM GOALS: Target date: 11/12/2022  Patient will be I with final HEP to maintain progress from PT. Baseline:  Goal status: INITIAL  2.  Patient will report >/= 58% status on FOTO to indicate improved functional ability. Baseline: 52% Goal status: INITIAL  3.  Patient will perform >/= 900 ft in order to improve community ambulation Baseline: 555 ft Goal status: INITIAL  4.  Patient will demonstrate hip strength >/= 4/5 MMT and knee strength 5/5 MMT to improve his walking tolerance and steadiness Baseline: hip strength grossly 3/4 MMT and knee strength grossly 4+/5 MMT Goal status: INITIAL   PLAN: PT FREQUENCY: 1-2x/week  PT DURATION: 8 weeks  PLANNED INTERVENTIONS: Therapeutic exercises, Therapeutic activity, Neuromuscular re-education, Balance training, Gait training, Patient/Family education, Self Care, Joint mobilization, Aquatic Therapy, Manual therapy, and Re-evaluation  PLAN FOR NEXT SESSION: Review HEP and progress PRN, focus on LE and postural strengthening, endurance training, balance training   Rosana Hoes, PT, DPT, LAT, ATC 09/18/22  7:17 AM Phone:  [phone] Fax: [phone]

## 2022-09-17 NOTE — Patient Instructions (Signed)
Access Code: [email or web address] URL: [email or web address] Date: 09/17/2022 Prepared by: Rosana Hoes  Exercises - Sit to Stand Without Arm Support  - 4 x weekly - 3 sets - 10 reps - Hip Abduction with Resistance Loop  - 4 x weekly - 3 sets - 10 reps - Hip Extension with Resistance Loop  - 4 x weekly - 3 sets - 10 reps - Heel Raises with Counter Support  - 4 x weekly - 3 sets - 15 reps - Standing Marching  - 4 x weekly - 3 sets - 20 reps - Romberg Stance with Head Nods  - 4 x weekly - 2 sets - 10 reps - Romberg Stance with Head Rotation  - 4 x weekly - 2 sets - 10 reps - Standing Row with Anchored Resistance  - 4 x weekly - 3 sets - 15 reps

## 2022-09-25 ENCOUNTER — Ambulatory Visit: Payer: Medicare Other | Admitting: Physical Therapy

## 2022-09-26 IMAGING — DX DG CHEST 2V
2 series · 2 of 2 positions shown · non-contrast
Comparison: 03/03/2018

CLINICAL DATA: Dyspnea, pulmonary hypertension, shortness of
breath, post CABG, hypertension, COPD, former smoker

EXAM:
CHEST - 2 VIEW

[chest pa]
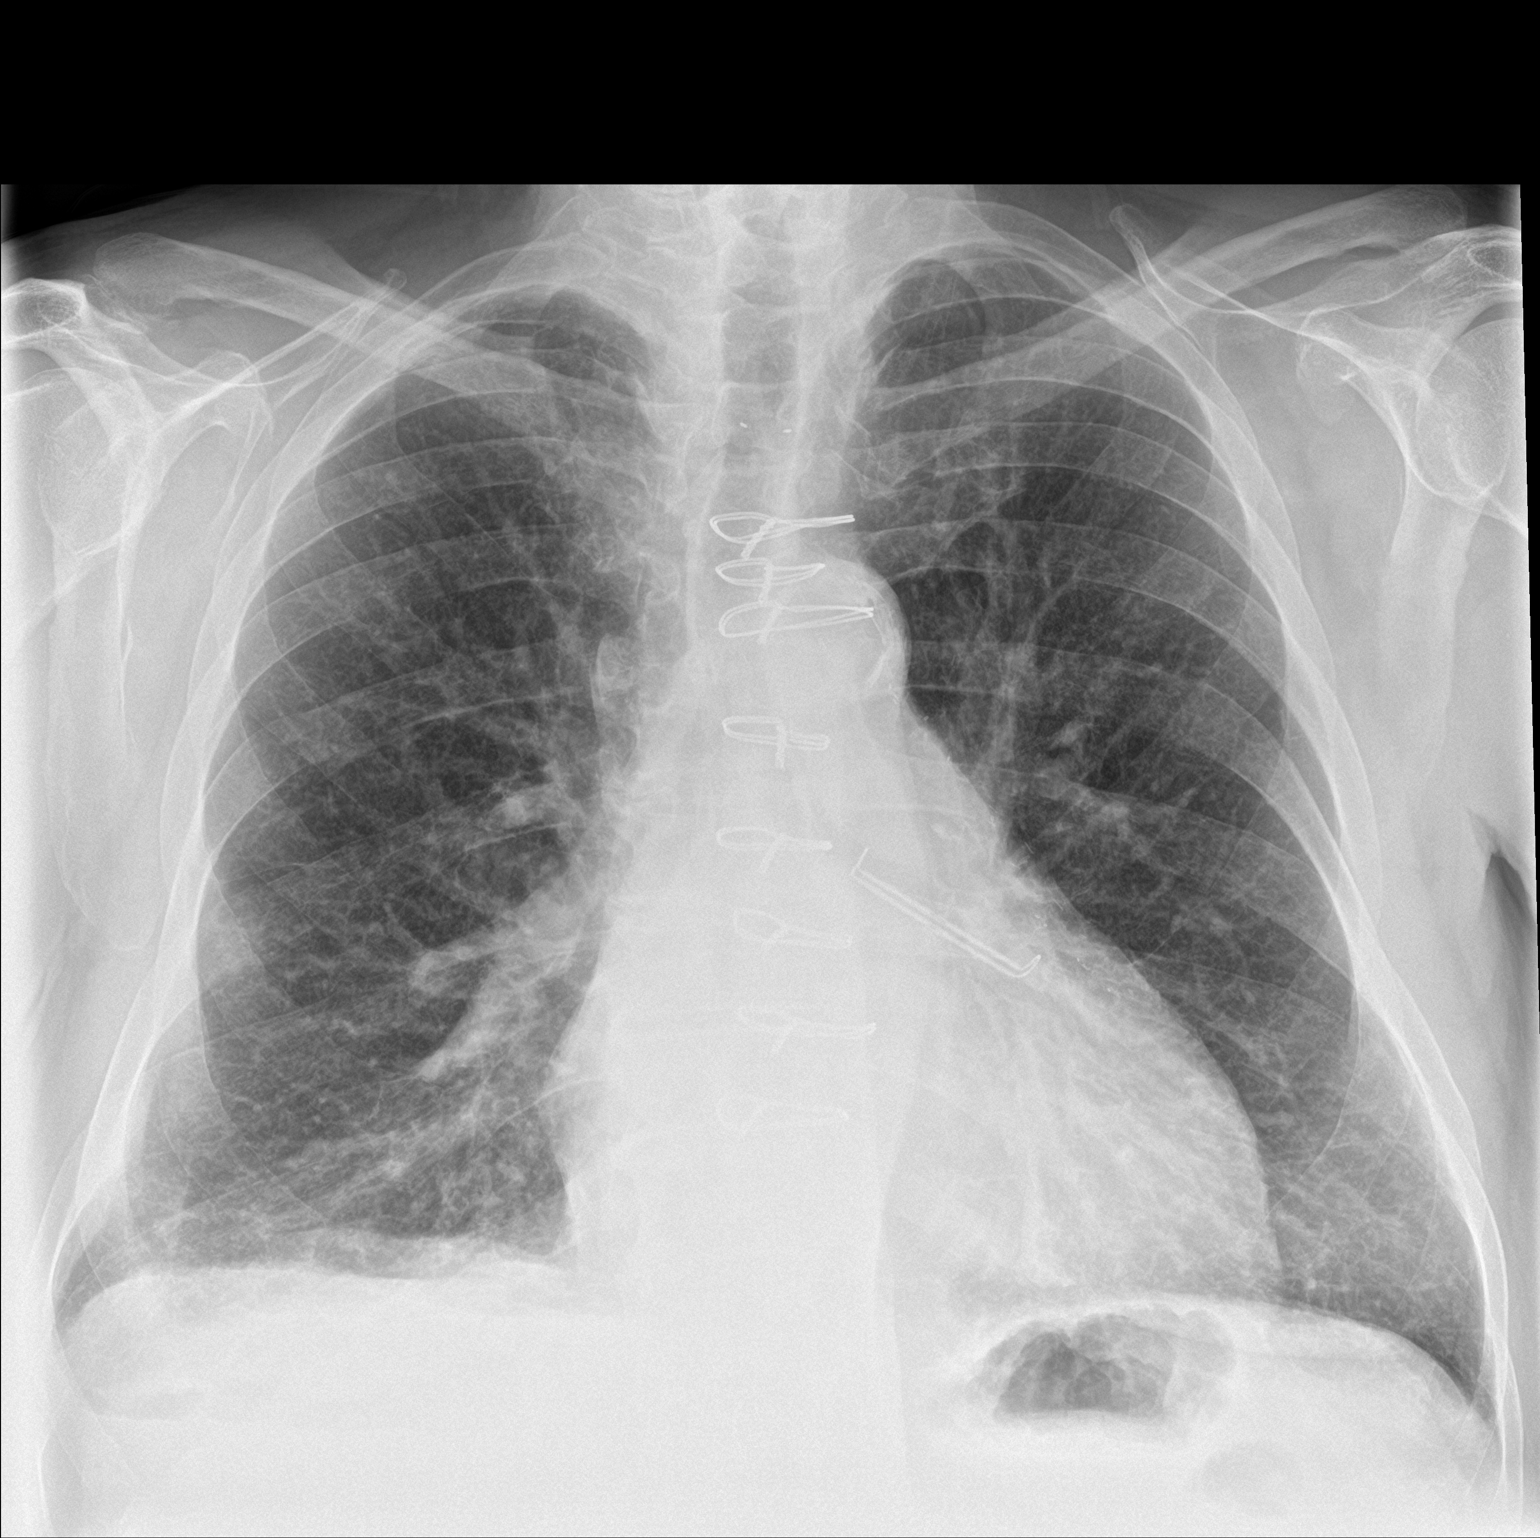

[chest lat]
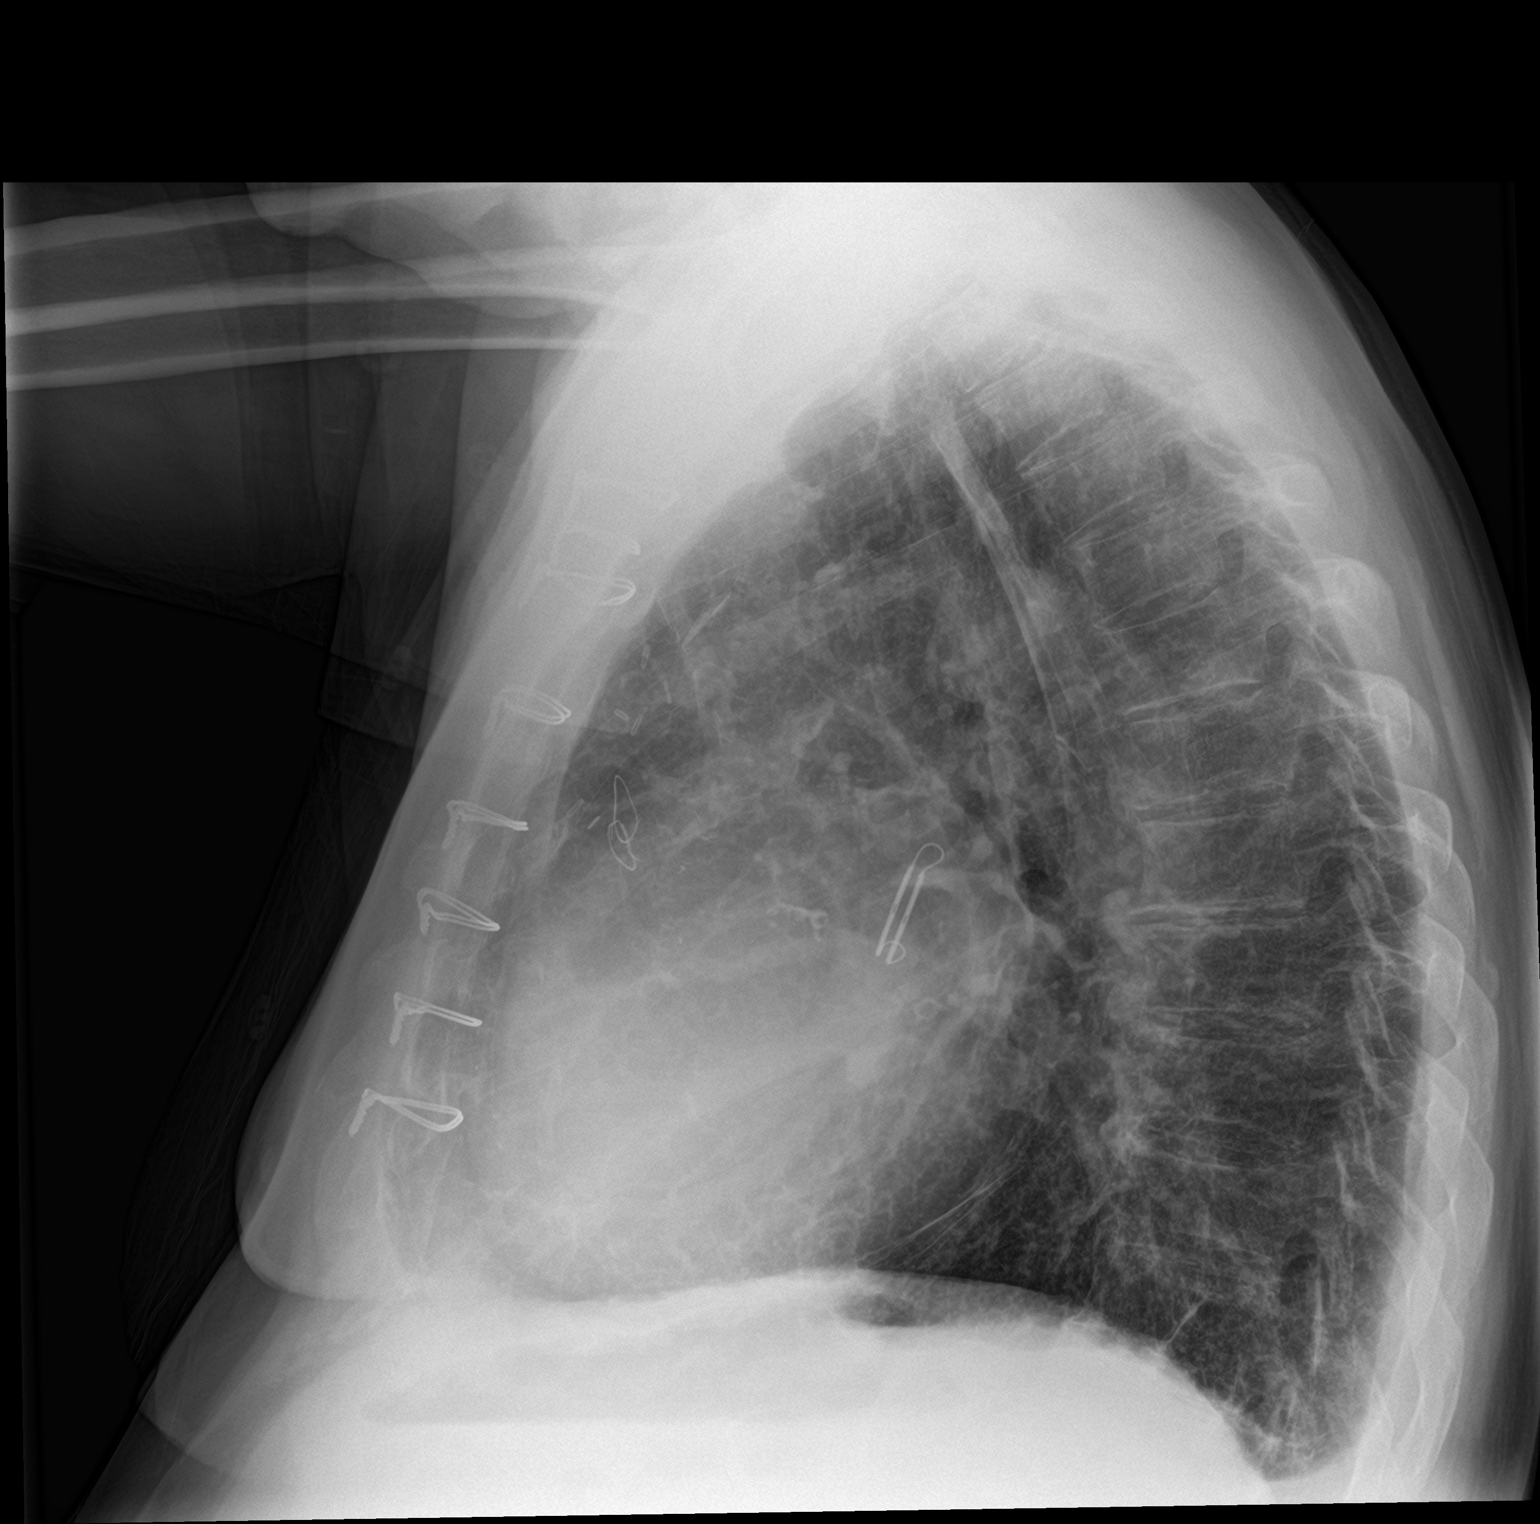

[2 of 2 positions shown; findings below may reference images not displayed]

FINDINGS: Minimal enlargement of cardiac silhouette post CABG and LEFT atrial
appendage clipping.

Mediastinal contours and pulmonary vascularity normal.

Atherosclerotic calcification aorta.

Minimal chronic interstitial prominence in both lungs, stable.

Underlying emphysematous changes.

No acute infiltrate, pleural effusion, or pneumothorax.

Mild degenerative disc disease changes thoracic spine.
IMPRESSION: Minimal enlargement of cardiac silhouette post CABG and LEFT atrial
appendage clipping.

Emphysematous changes and chronic interstitial prominence without
acute infiltrate.

Aortic Atherosclerosis (YFO9C-9LQ.Q) and Emphysema (YFO9C-JY5.B).

## 2022-09-26 IMAGING — NM NM PULMONARY PERF PARTICULATE
8 series · 8 of 8 positions shown · non-contrast
Comparison: Chest radiograph 06/28/2021

CLINICAL DATA: Pulmonary hypertension, shortness of breath for 4
months, history of bypass surgery, colon cancer, former smoker

EXAM:
NUCLEAR MEDICINE PERFUSION LUNG SCAN
TECHNIQUE: Perfusion images were obtained in multiple projections after
intravenous injection of radiopharmaceutical.
Ventilation scans intentionally deferred if perfusion scan and chest
x-ray adequate for interpretation during COVID 19 epidemic.
RADIOPHARMACEUTICALS:  3.8 mCi Yc-LLm MAA IV

[Series 1: ant/post perf · 4.14mm/px · 1 of 1 slices shown (1 of 2)]
[im 1/1  full-range]
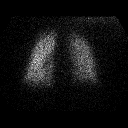

[Series 1: ant/post perf · 4.14mm/px · 1 of 1 slices shown (2 of 2)]
[im 1/1  full-range]
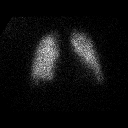

[Series 2: lao/rpo perf · 4.14mm/px · 1 of 1 slices shown (1 of 2)]
[im 1/1  full-range]
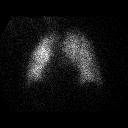

[Series 2: lao/rpo perf · 4.14mm/px · 1 of 1 slices shown (2 of 2)]
[im 1/1  full-range]
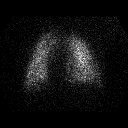

[Series 3: lpo/rao perf · 4.14mm/px · 1 of 1 slices shown (1 of 2)]
[im 1/1  full-range]
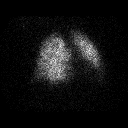

[Series 3: lpo/rao perf · 4.14mm/px · 1 of 1 slices shown (2 of 2)]
[im 1/1  full-range]
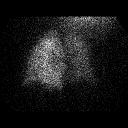

[Series 4: lt lat/rt lat perf · 4.14mm/px · 1 of 1 slices shown (1 of 2)]
[im 1/1  full-range]
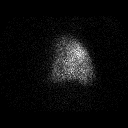

[Series 4: lt lat/rt lat perf · 4.14mm/px · 1 of 1 slices shown (2 of 2)]
[im 1/1  full-range]
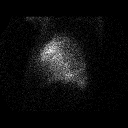

[8 of 8 positions shown; findings below may reference images not displayed]

FINDINGS: Reversal of anteroposterior perfusion gradient consistent with
pulmonary arterial hypertension.

No segmental or subsegmental perfusion defects are identified to
suggest chronic pulmonary emboli.
IMPRESSION: No evidence of pulmonary embolism.

Reversal of anteroposterior perfusion gradient consistent with
pulmonary arterial hypertension.

## 2022-09-26 NOTE — Therapy (Signed)
OUTPATIENT PHYSICAL THERAPY TREATMENT   Patient Name: [name] MRN: [id number] DOB:01/05/1941, 82 y.o., male Today's Date: 09/27/2022   END OF SESSION:  PT End of Session - 09/27/22 1407     Visit Number 2    Number of Visits 17    Date for PT Re-Evaluation 11/12/22    Authorization Type UHC MCR    Progress Note Due on Visit 10    PT Start Time 1400    PT Stop Time 1445    PT Time Calculation (min) 45 min    Activity Tolerance Patient tolerated treatment well    Behavior During Therapy [facility] for tasks assessed/performed              Past Medical History:  Diagnosis Date   A-fib (HCC)    Allergy    Seasonal--pollen   Arrhythmia    CAD (coronary artery disease) 1999   RCA stent   Carotid stenosis, asymptomatic    moderate RT and mild LT with carotid dopplers 10/26/11   Colon cancer (HCC) 04/2006   Stage III--T3 N1   COPD (chronic obstructive pulmonary disease) (HCC)    H/O cardiovascular stress test 01/13/2010   low risk scan, similar to 2008 study   H/O echocardiogram 05/23/2009   EF >55%, mild MR, Mild TR,    Hand foot syndrome    Hemorrhoids    Hyperlipidemia    Hypertension    Neuropathy    Permanent atrial fibrillation (HCC)    Psoriasis    Tubular adenoma of colon    Past Surgical History:  Procedure Laterality Date   ABDOMINAL AORTOGRAM W/LOWER EXTREMITY N/A 12/21/2021   Procedure: ABDOMINAL AORTOGRAM W/LOWER EXTREMITY;  Surgeon: [name], MD;  [facility]: [facility];  Service: Cardiovascular;  Laterality: N/A;   CARDIAC CATHETERIZATION     CLIPPING OF ATRIAL APPENDAGE  01/28/2018   Procedure: CLIPPING OF ATRIAL APPENDAGE;  Surgeon: Alleen Borne, MD;  Location: [facility];  Service: Open Heart Surgery;;   COLONOSCOPY     CORONARY ARTERY BYPASS GRAFT N/A 01/28/2018   Procedure: CORONARY ARTERY BYPASS GRAFTING (CABG) X 4 USING LEFT INTERNAL MAMMARY ARTERY AND LEFT GREATER SAPHENOUS VEIN HARVESTED ENDOSCOPICALLY;  Surgeon: Alleen Borne, MD;  Location: [facility];  Service: Open Heart Surgery;  Laterality: N/A;   CORONARY STENT PLACEMENT  1999   RCA tandem NIR stents   LEFT HEART CATH AND CORONARY ANGIOGRAPHY N/A 01/23/2018   Procedure: LEFT HEART CATH AND CORONARY ANGIOGRAPHY;  Surgeon: Yvonne Kendall, MD;  [facility]: [facility];  Service: Cardiovascular;  Laterality: N/A;   PERIPHERAL VASCULAR ATHERECTOMY Bilateral 12/21/2021   Procedure: PERIPHERAL VASCULAR ATHERECTOMY;  Surgeon: [name], MD;  [facility];  Service: Cardiovascular;  Laterality: Bilateral;  common illiac   PERIPHERAL VASCULAR INTERVENTION Bilateral 12/21/2021   Procedure: PERIPHERAL VASCULAR INTERVENTION;  Surgeon: [name], MD;  [facility];  Service: Cardiovascular;  Laterality: Bilateral;  Common Illiacs   RIGHT COLECTOMY  05/29/2006   Laparoscopic assisted   RIGHT HEART CATH N/A 06/14/2021   Procedure: RIGHT HEART CATH;  Surgeon: Dolores Patty, MD;  [facility]: [facility];  Service: Cardiovascular;  Laterality: N/A;   TEE WITHOUT CARDIOVERSION N/A 01/28/2018   Procedure: TRANSESOPHAGEAL ECHOCARDIOGRAM (TEE);  Surgeon: Alleen Borne, MD;  Location: [facility] OR;  Service: Open Heart Surgery;  Laterality: N/A;   TONSILLECTOMY     Patient Active Problem List  Diagnosis Date Noted   Atherosclerotic heart disease of native coronary artery with other forms of angina pectoris (HCC) 09/12/2022   Chronic diastolic heart failure (HCC) 09/12/2022   Near syncope 08/26/2022   Hyponatremia 08/26/2022   Symptomatic bradycardia 08/26/2022   Claudication in peripheral vascular disease (HCC) 12/21/2021   Obstructive sleep apnea 07/04/2021   Peripheral arterial disease (HCC) 04/25/2021   Pulmonary hypertension, unspecified (HCC) 03/22/2021   Edema of leg 02/26/2018   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   NSTEMI (non-ST elevated myocardial infarction) (HCC) 01/21/2018   History of colon cancer 08/19/2013    Essential hypertension 11/20/2012   Dyslipidemia, goal LDL below 70 11/20/2012   Chronic anticoagulation    Carotid stenosis, asymptomatic    CAD S/P percutaneous coronary angioplasty    Atrial fibrillation with slow ventricular response (HCC) 06/02/2012    PCP: Thana Ates, MD  REFERRING PROVIDER: Osvaldo Shipper, MD  REFERRING DIAG: Muscle weakness, Other abnormalities of gait and mobility, Unsteadiness on feet, Difficulty in walking, not elsewhere classified  THERAPY DIAG:  Muscle weakness (generalized)  Other abnormalities of gait and mobility  Rationale for Evaluation and Treatment: Rehabilitation  ONSET DATE: Chronic, about 2 years    SUBJECTIVE:  SUBJECTIVE STATEMENT: Patient reports he isn't feeling as good today so is moving slowly. He has done some of the exercises without any difficulty.  PAIN:  Are you having pain? No  PERTINENT HISTORY: See cardiac history above  PRECAUTIONS: Fall  WEIGHT BEARING RESTRICTIONS: No  PATIENT GOALS: Get stronger and improve walking   OBJECTIVE:  PATIENT SURVEYS:  FOTO 52% functional status  POSTURE:   Rounded shoulder posture  LOWER EXTREMITY MMT:  MMT Right eval Left eval  Hip flexion 4 4  Hip extension 3 3  Hip abduction 3 3  Hip adduction    Hip internal rotation    Hip external rotation    Knee flexion 4+ 4+  Knee extension 4+ 4+  Ankle dorsiflexion    Ankle plantarflexion    Ankle inversion    Ankle eversion     (Blank rows = not tested)  FUNCTIONAL TESTS:  5 times sit to stand: 15 seconds Timed up and go (TUG): 12 seconds 6 minute walk test: 555 ft- 2 min rest break at 3 min  GAIT: Distance walked: 555 ft Assistive device utilized: None Level of assistance: Complete Independence Comments: Decreased gait speed and step length, occasional unsteadiness   TODAY'S TREATMENT:     [facility] Adult PT Treatment:                                                DATE: 09/27/2022 Therapeutic  Exercise: NuStep L6 x 5 min with UE/LE while taking subjective Sit to stand x 10, x 7, x 6 LAQ with 5# 3 x 10 Standing hip abduction and extension with red at knees 2 x 10 each Standing heel raises 2 x 10 Row with FM both handles 10# each 3 x 15 Lateral stepping with FM 10# x 2 lengths each Neuro Re-education: Romberg on Airex with head turns and nods 2 x 10 each Tandem stance 2 x 30 sec each   [facility] Adult PT Treatment:  DATE: 09/17/2022 Therapeutic Exercise: Sit to stand x 10 Standing hip abduction and extension with yellow at knees x 10 each Standing heel raises x 15 Standing march x 20 Romberg with head turns and nods x 10 each Row with green x 15  PATIENT EDUCATION:  Education details: HEP Person educated: Patient Education method: Programmer, multimedia, Demonstration, Actor cues, Verbal cues Education comprehension: verbalized understanding, returned demonstration, verbal cues required, tactile cues required, and needs further education  HOME EXERCISE PROGRAM: Access Code: [email or web address]    ASSESSMENT: CLINICAL IMPRESSION: Patient tolerated therapy well with no adverse effects. Therapy focused on progressing LE strength and balance. He seemed to be more limited this visit regarding LE vein issues so he required more frequent rest breaks. He was able to progress to unstable surface with balance and did exhibit increased sway and occasional need for UE support. He exhibits more difficulty with tandem stance with frequent need for UE support to maintain balance. He reports greater difficulty with strengthening on right leg due to vein procedure. No changes to HEP but patient was provided red band for strengthening. Patient would benefit from continued skilled PT to progress his mobility, strength, and balance in order to improve confidence with walking and maximize functional ability.   OBJECTIVE IMPAIRMENTS: Abnormal gait, decreased activity  tolerance, decreased balance, decreased endurance, decreased strength, postural dysfunction, and pain.   ACTIVITY LIMITATIONS: lifting, squatting, and locomotion level  PARTICIPATION LIMITATIONS: shopping and community activity  PERSONAL FACTORS: Fitness, Past/current experiences, and Time since onset of injury/illness/exacerbation are also affecting patient's functional outcome.    GOALS: Goals reviewed with patient? Yes  SHORT TERM GOALS: Target date: 10/15/2022  Patient will be I with initial HEP in order to progress with therapy. Baseline: HEP provided at eval Goal status: INITIAL  2.  Patient will perform 5xSTS </= 12 seconds to indicate improved strength and reduced fall risk Baseline: 15 seconds Goal status: INITIAL  3.  Patient will perform TUG </= 9 seconds to indicate improved mobility and confidence with walking Baseline: 12 seconds Goal status: INITIAL  LONG TERM GOALS: Target date: 11/12/2022  Patient will be I with final HEP to maintain progress from PT. Baseline:  Goal status: INITIAL  2.  Patient will report >/= 58% status on FOTO to indicate improved functional ability. Baseline: 52% Goal status: INITIAL  3.  Patient will perform >/= 900 ft in order to improve community ambulation Baseline: 555 ft Goal status: INITIAL  4.  Patient will demonstrate hip strength >/= 4/5 MMT and knee strength 5/5 MMT to improve his walking tolerance and steadiness Baseline: hip strength grossly 3/4 MMT and knee strength grossly 4+/5 MMT Goal status: INITIAL   PLAN: PT FREQUENCY: 1-2x/week  PT DURATION: 8 weeks  PLANNED INTERVENTIONS: Therapeutic exercises, Therapeutic activity, Neuromuscular re-education, Balance training, Gait training, Patient/Family education, Self Care, Joint mobilization, Aquatic Therapy, Manual therapy, and Re-evaluation  PLAN FOR NEXT SESSION: Review HEP and progress PRN, focus on LE and postural strengthening, endurance training, balance  training   Rosana Hoes, PT, DPT, LAT, ATC 09/27/22  2:59 PM Phone: [phone] Fax: [phone]

## 2022-09-27 ENCOUNTER — Other Ambulatory Visit: Payer: Self-pay

## 2022-09-27 ENCOUNTER — Ambulatory Visit: Payer: Medicare Other | Admitting: Physical Therapy

## 2022-09-27 ENCOUNTER — Encounter: Payer: Self-pay | Admitting: Physical Therapy

## 2022-09-27 DIAGNOSIS — M6281 Muscle weakness (generalized): Secondary | ICD-10-CM

## 2022-09-27 DIAGNOSIS — R2689 Other abnormalities of gait and mobility: Secondary | ICD-10-CM

## 2022-09-27 MED ORDER — POTASSIUM CHLORIDE CRYS ER 20 MEQ PO TBCR: 20.0000 meq | EXTENDED_RELEASE_TABLET | Freq: Every day | ORAL | 3 refills | Status: DC

## 2022-10-01 NOTE — Therapy (Signed)
OUTPATIENT PHYSICAL THERAPY TREATMENT   Patient Name: [name] MRN: [id number] DOB:1941/02/04, 82 y.o., male Today's Date: 10/02/2022   END OF SESSION:  PT End of Session - 10/02/22 1453     Visit Number 3    Number of Visits 17    Date for PT Re-Evaluation 11/12/22    Authorization Type [employer] MCR    Progress Note Due on Visit 10    PT Start Time 1445    PT Stop Time 1525    PT Time Calculation (min) 40 min    Activity Tolerance Patient tolerated treatment well    Behavior During Therapy [facility] for tasks assessed/performed               Past Medical History:  Diagnosis Date   A-fib (HCC)    Allergy    Seasonal--pollen   Arrhythmia    CAD (coronary artery disease) 1999   RCA stent   Carotid stenosis, asymptomatic    moderate RT and mild LT with carotid dopplers 10/26/11   Colon cancer (HCC) 04/2006   Stage III--T3 N1   COPD (chronic obstructive pulmonary disease) (HCC)    H/O cardiovascular stress test 01/13/2010   low risk scan, similar to 2008 study   H/O echocardiogram 05/23/2009   EF >55%, mild MR, Mild TR,    Hand foot syndrome    Hemorrhoids    Hyperlipidemia    Hypertension    Neuropathy    Permanent atrial fibrillation (HCC)    Psoriasis    Tubular adenoma of colon    Past Surgical History:  Procedure Laterality Date   ABDOMINAL AORTOGRAM W/LOWER EXTREMITY N/A 12/21/2021   Procedure: ABDOMINAL AORTOGRAM W/LOWER EXTREMITY;  Surgeon: [name], MD;  [facility]: [facility];  Service: Cardiovascular;  Laterality: N/A;   CARDIAC CATHETERIZATION     CLIPPING OF ATRIAL APPENDAGE  01/28/2018   Procedure: CLIPPING OF ATRIAL APPENDAGE;  Surgeon: Alleen Borne, MD;  Location: [facility];  Service: Open Heart Surgery;;   COLONOSCOPY     CORONARY ARTERY BYPASS GRAFT N/A 01/28/2018   Procedure: CORONARY ARTERY BYPASS GRAFTING (CABG) X 4 USING LEFT INTERNAL MAMMARY ARTERY AND LEFT GREATER SAPHENOUS VEIN HARVESTED ENDOSCOPICALLY;  Surgeon: Alleen Borne, MD;  Location: [facility];  Service: Open Heart Surgery;  Laterality: N/A;   CORONARY STENT PLACEMENT  1999   RCA tandem NIR stents   LEFT HEART CATH AND CORONARY ANGIOGRAPHY N/A 01/23/2018   Procedure: LEFT HEART CATH AND CORONARY ANGIOGRAPHY;  Surgeon: Yvonne Kendall, MD;  [facility];  Service: Cardiovascular;  Laterality: N/A;   PERIPHERAL VASCULAR ATHERECTOMY Bilateral 12/21/2021   Procedure: PERIPHERAL VASCULAR ATHERECTOMY;  Surgeon: [name], MD;  [facility];  Service: Cardiovascular;  Laterality: Bilateral;  common illiac   PERIPHERAL VASCULAR INTERVENTION Bilateral 12/21/2021   Procedure: PERIPHERAL VASCULAR INTERVENTION;  Surgeon: [name], MD;  [facility];  Service: Cardiovascular;  Laterality: Bilateral;  Common Illiacs   RIGHT COLECTOMY  05/29/2006   Laparoscopic assisted   RIGHT HEART CATH N/A 06/14/2021   Procedure: RIGHT HEART CATH;  Surgeon: Dolores Patty, MD;  [facility];  Service: Cardiovascular;  Laterality: N/A;   TEE WITHOUT CARDIOVERSION N/A 01/28/2018   Procedure: TRANSESOPHAGEAL ECHOCARDIOGRAM (TEE);  Surgeon: Alleen Borne, MD;  Location: [facility] OR;  Service: Open Heart Surgery;  Laterality: N/A;   TONSILLECTOMY     Patient Active Problem List  Diagnosis Date Noted   Atherosclerotic heart disease of native coronary artery with other forms of angina pectoris (HCC) 09/12/2022   Chronic diastolic heart failure (HCC) 09/12/2022   Near syncope 08/26/2022   Hyponatremia 08/26/2022   Symptomatic bradycardia 08/26/2022   Claudication in peripheral vascular disease (HCC) 12/21/2021   Obstructive sleep apnea 07/04/2021   Peripheral arterial disease (HCC) 04/25/2021   Pulmonary hypertension, unspecified (HCC) 03/22/2021   Edema of leg 02/26/2018   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   NSTEMI (non-ST elevated myocardial infarction) (HCC) 01/21/2018   History of colon cancer 08/19/2013    Essential hypertension 11/20/2012   Dyslipidemia, goal LDL below 70 11/20/2012   Chronic anticoagulation    Carotid stenosis, asymptomatic    CAD S/P percutaneous coronary angioplasty    Atrial fibrillation with slow ventricular response (HCC) 06/02/2012    PCP: Thana Ates, MD  REFERRING PROVIDER: Osvaldo Shipper, MD  REFERRING DIAG: Muscle weakness, Other abnormalities of gait and mobility, Unsteadiness on feet, Difficulty in walking, not elsewhere classified  THERAPY DIAG:  Muscle weakness (generalized)  Other abnormalities of gait and mobility  Rationale for Evaluation and Treatment: Rehabilitation  ONSET DATE: Chronic, about 2 years    SUBJECTIVE:  SUBJECTIVE STATEMENT: Patient reports he is doing better today. He does note having a lot of swelling in the right leg today. He does see his vascular doctor on Friday.  PAIN:  Are you having pain? No  PERTINENT HISTORY: See cardiac history above  PRECAUTIONS: Fall  WEIGHT BEARING RESTRICTIONS: No  PATIENT GOALS: Get stronger and improve walking   OBJECTIVE:  PATIENT SURVEYS:  FOTO 52% functional status  POSTURE:   Rounded shoulder posture  LOWER EXTREMITY MMT:  MMT Right eval Left eval  Hip flexion 4 4  Hip extension 3 3  Hip abduction 3 3  Hip adduction    Hip internal rotation    Hip external rotation    Knee flexion 4+ 4+  Knee extension 4+ 4+  Ankle dorsiflexion    Ankle plantarflexion    Ankle inversion    Ankle eversion     (Blank rows = not tested)  FUNCTIONAL TESTS:  5 times sit to stand: 15 seconds  10/02/2022: 12 seconds Timed up and go (TUG): 12 seconds 6 minute walk test: 555 ft- 2 min rest break at 3 min  GAIT: Distance walked: 555 ft Assistive device utilized: None Level of assistance: Complete Independence Comments: Decreased gait speed and step length, occasional unsteadiness   TODAY'S TREATMENT:     [facility] Adult PT Treatment:                                                 DATE: 10/02/2022 Therapeutic Exercise: NuStep L6 x 5 min with UE/LE while taking subjective Sit to stand 3 x 10 Standing hip abduction and extension with red at knees 2 x 15 each Knee extension and flexion machine 15# 3 x 10 each Forward 6" step-up 2 x 10 each Standing heel raises 2 x 20   [facility] Adult PT Treatment:                                                DATE: 09/27/2022 Therapeutic Exercise:  NuStep L6 x 5 min with UE/LE while taking subjective Sit to stand x 10, x 7, x 6 LAQ with 5# 3 x 10 Standing hip abduction and extension with red at knees 2 x 10 each Standing heel raises 2 x 10 Row with FM both handles 10# each 3 x 15 Lateral stepping with FM 10# x 2 lengths each Neuro Re-education: Romberg on Airex with head turns and nods 2 x 10 each Tandem stance 2 x 30 sec each  [facility] Adult PT Treatment:                                                DATE: 09/17/2022 Therapeutic Exercise: Sit to stand x 10 Standing hip abduction and extension with yellow at knees x 10 each Standing heel raises x 15 Standing march x 20 Romberg with head turns and nods x 10 each Row with green x 15  PATIENT EDUCATION:  Education details: HEP Person educated: Patient Education method: Programmer, multimedia, Demonstration, Actor cues, Verbal cues Education comprehension: verbalized understanding, returned demonstration, verbal cues required, tactile cues required, and needs further education  HOME EXERCISE PROGRAM: Access Code: [email or web address]    ASSESSMENT: CLINICAL IMPRESSION: Patient tolerated therapy well with no adverse effects. Therapy focused primarily on strengthening for LE this visit. He was able to perform sit to stands with better tolerance this visit but did report leg fatigue and weakness toward end of session. He did demonstrate improved 5xSTS achieving his STG to indicate improved strength and reduced fall risk. He tolerated use of machines for strengthening well. No changes made to his HEP this  visit, he was instructed to increase reps if exercises become easier. Patient would benefit from continued skilled PT to progress his mobility, strength, and balance in order to improve confidence with walking and maximize functional ability.   OBJECTIVE IMPAIRMENTS: Abnormal gait, decreased activity tolerance, decreased balance, decreased endurance, decreased strength, postural dysfunction, and pain.   ACTIVITY LIMITATIONS: lifting, squatting, and locomotion level  PARTICIPATION LIMITATIONS: shopping and community activity  PERSONAL FACTORS: Fitness, Past/current experiences, and Time since onset of injury/illness/exacerbation are also affecting patient's functional outcome.    GOALS: Goals reviewed with patient? Yes  SHORT TERM GOALS: Target date: 10/15/2022  Patient will be I with initial HEP in order to progress with therapy. Baseline: HEP provided at eval Goal status: INITIAL  2.  Patient will perform 5xSTS </= 12 seconds to indicate improved strength and reduced fall risk Baseline: 15 seconds 10/02/2022: 12 seconds Goal status: MET  3.  Patient will perform TUG </= 9 seconds to indicate improved mobility and confidence with walking Baseline: 12 seconds Goal status: INITIAL  LONG TERM GOALS: Target date: 11/12/2022  Patient will be I with final HEP to maintain progress from PT. Baseline:  Goal status: INITIAL  2.  Patient will report >/= 58% status on FOTO to indicate improved functional ability. Baseline: 52% Goal status: INITIAL  3.  Patient will perform >/= 900 ft in order to improve community ambulation Baseline: 555 ft Goal status: INITIAL  4.  Patient will demonstrate hip strength >/= 4/5 MMT and knee strength 5/5 MMT to improve his walking tolerance and steadiness Baseline: hip strength grossly 3/4 MMT and knee strength grossly 4+/5 MMT Goal status: INITIAL   PLAN: PT FREQUENCY: 1-2x/week  PT DURATION: 8 weeks  PLANNED INTERVENTIONS: Therapeutic  exercises, Therapeutic activity, Neuromuscular re-education, Balance training, Gait training, Patient/Family education, Self Care, Joint mobilization, Aquatic Therapy, Manual therapy, and Re-evaluation  PLAN FOR NEXT SESSION: Review HEP and progress PRN, focus on LE and postural strengthening, endurance training, balance training   Rosana Hoes, PT, DPT, LAT, ATC 10/02/22  3:35 PM Phone: [phone] Fax: [phone]

## 2022-10-02 ENCOUNTER — Encounter: Payer: Self-pay | Admitting: Physical Therapy

## 2022-10-02 ENCOUNTER — Ambulatory Visit: Payer: Medicare Other | Admitting: Physical Therapy

## 2022-10-02 ENCOUNTER — Other Ambulatory Visit: Payer: Self-pay

## 2022-10-02 DIAGNOSIS — R2689 Other abnormalities of gait and mobility: Secondary | ICD-10-CM | POA: Diagnosis not present

## 2022-10-02 DIAGNOSIS — M6281 Muscle weakness (generalized): Secondary | ICD-10-CM | POA: Diagnosis not present

## 2022-10-04 ENCOUNTER — Ambulatory Visit: Payer: Medicare Other | Admitting: Physical Therapy

## 2022-10-04 DIAGNOSIS — G4733 Obstructive sleep apnea (adult) (pediatric): Secondary | ICD-10-CM | POA: Diagnosis not present

## 2022-10-04 DIAGNOSIS — I10 Essential (primary) hypertension: Secondary | ICD-10-CM | POA: Diagnosis not present

## 2022-10-04 DIAGNOSIS — R2689 Other abnormalities of gait and mobility: Secondary | ICD-10-CM | POA: Diagnosis not present

## 2022-10-04 DIAGNOSIS — M6281 Muscle weakness (generalized): Secondary | ICD-10-CM

## 2022-10-04 NOTE — Therapy (Signed)
OUTPATIENT PHYSICAL THERAPY TREATMENT   Patient Name: [name] MRN: [id number] DOB:1941/02/08, 82 y.o., male Today's Date: 10/04/2022   END OF SESSION:  PT End of Session - 10/04/22 1318     Visit Number 4    Number of Visits 17    Date for PT Re-Evaluation 11/12/22    Authorization Type [employer] MCR    Progress Note Due on Visit 10    PT Start Time 1318    PT Stop Time 1356    PT Time Calculation (min) 38 min               Past Medical History:  Diagnosis Date   A-fib (HCC)    Allergy    Seasonal--pollen   Arrhythmia    CAD (coronary artery disease) 1999   RCA stent   Carotid stenosis, asymptomatic    moderate RT and mild LT with carotid dopplers 10/26/11   Colon cancer (HCC) 04/2006   Stage III--T3 N1   COPD (chronic obstructive pulmonary disease) (HCC)    H/O cardiovascular stress test 01/13/2010   low risk scan, similar to 2008 study   H/O echocardiogram 05/23/2009   EF >55%, mild MR, Mild TR,    Hand foot syndrome    Hemorrhoids    Hyperlipidemia    Hypertension    Neuropathy    Permanent atrial fibrillation (HCC)    Psoriasis    Tubular adenoma of colon    Past Surgical History:  Procedure Laterality Date   ABDOMINAL AORTOGRAM W/LOWER EXTREMITY N/A 12/21/2021   Procedure: ABDOMINAL AORTOGRAM W/LOWER EXTREMITY;  Surgeon: [name], MD;  [facility];  Service: Cardiovascular;  Laterality: N/A;   CARDIAC CATHETERIZATION     CLIPPING OF ATRIAL APPENDAGE  01/28/2018   Procedure: CLIPPING OF ATRIAL APPENDAGE;  Surgeon: Alleen Borne, MD;  Location: [facility];  Service: Open Heart Surgery;;   COLONOSCOPY     CORONARY ARTERY BYPASS GRAFT N/A 01/28/2018   Procedure: CORONARY ARTERY BYPASS GRAFTING (CABG) X 4 USING LEFT INTERNAL MAMMARY ARTERY AND LEFT GREATER SAPHENOUS VEIN HARVESTED ENDOSCOPICALLY;  Surgeon: Alleen Borne, MD;  Location: [facility];  Service: Open Heart Surgery;  Laterality: N/A;   CORONARY STENT PLACEMENT  1999    RCA tandem NIR stents   LEFT HEART CATH AND CORONARY ANGIOGRAPHY N/A 01/23/2018   Procedure: LEFT HEART CATH AND CORONARY ANGIOGRAPHY;  Surgeon: Yvonne Kendall, MD;  [facility]: [facility];  Service: Cardiovascular;  Laterality: N/A;   PERIPHERAL VASCULAR ATHERECTOMY Bilateral 12/21/2021   Procedure: PERIPHERAL VASCULAR ATHERECTOMY;  Surgeon: [name], MD;  [facility]: [facility];  Service: Cardiovascular;  Laterality: Bilateral;  common illiac   PERIPHERAL VASCULAR INTERVENTION Bilateral 12/21/2021   Procedure: PERIPHERAL VASCULAR INTERVENTION;  Surgeon: [name], MD;  [facility]: [facility];  Service: Cardiovascular;  Laterality: Bilateral;  Common Illiacs   RIGHT COLECTOMY  05/29/2006   Laparoscopic assisted   RIGHT HEART CATH N/A 06/14/2021   Procedure: RIGHT HEART CATH;  Surgeon: Dolores Patty, MD;  [facility]: [facility];  Service: Cardiovascular;  Laterality: N/A;   TEE WITHOUT CARDIOVERSION N/A 01/28/2018   Procedure: TRANSESOPHAGEAL ECHOCARDIOGRAM (TEE);  Surgeon: Alleen Borne, MD;  Location: [facility] OR;  Service: Open Heart Surgery;  Laterality: N/A;   TONSILLECTOMY     Patient Active Problem List   Diagnosis Date Noted   Atherosclerotic heart disease of native coronary artery with other forms of angina pectoris (  HCC) 09/12/2022   Chronic diastolic heart failure (HCC) 09/12/2022   Near syncope 08/26/2022   Hyponatremia 08/26/2022   Symptomatic bradycardia 08/26/2022   Claudication in peripheral vascular disease (HCC) 12/21/2021   Obstructive sleep apnea 07/04/2021   Peripheral arterial disease (HCC) 04/25/2021   Pulmonary hypertension, unspecified (HCC) 03/22/2021   Edema of leg 02/26/2018   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   NSTEMI (non-ST elevated myocardial infarction) (HCC) 01/21/2018   History of colon cancer 08/19/2013   Essential hypertension 11/20/2012   Dyslipidemia, goal LDL below 70 11/20/2012   Chronic anticoagulation    Carotid  stenosis, asymptomatic    CAD S/P percutaneous coronary angioplasty    Atrial fibrillation with slow ventricular response (HCC) 06/02/2012    PCP: Thana Ates, MD  REFERRING PROVIDER: Osvaldo Shipper, MD  REFERRING DIAG: Muscle weakness, Other abnormalities of gait and mobility, Unsteadiness on feet, Difficulty in walking, not elsewhere classified  THERAPY DIAG:  Muscle weakness (generalized)  Other abnormalities of gait and mobility  Rationale for Evaluation and Treatment: Rehabilitation  ONSET DATE: Chronic, about 2 years    SUBJECTIVE:  SUBJECTIVE STATEMENT: Patient reports he is sore in general today. He has been working in the yard and shop more.    PAIN:  Are you having pain? No  PERTINENT HISTORY: See cardiac history above  PRECAUTIONS: Fall  WEIGHT BEARING RESTRICTIONS: No  PATIENT GOALS: Get stronger and improve walking   OBJECTIVE:  PATIENT SURVEYS:  FOTO 52% functional status  POSTURE:   Rounded shoulder posture  LOWER EXTREMITY MMT:  MMT Right eval Left eval  Hip flexion 4 4  Hip extension 3 3  Hip abduction 3 3  Hip adduction    Hip internal rotation    Hip external rotation    Knee flexion 4+ 4+  Knee extension 4+ 4+  Ankle dorsiflexion    Ankle plantarflexion    Ankle inversion    Ankle eversion     (Blank rows = not tested)  FUNCTIONAL TESTS:  5 times sit to stand: 15 seconds  10/02/2022: 12 seconds Timed up and go (TUG): 12 seconds 6 minute walk test: 555 ft- 2 min rest break at 3 min  GAIT: Distance walked: 555 ft Assistive device utilized: None Level of assistance: Complete Independence Comments: Decreased gait speed and step length, occasional unsteadiness   TODAY'S TREATMENT:     [facility] Adult PT Treatment:                                                DATE: 10/04/22 Therapeutic Exercise: Nustep L6 x 5 min with UE/LE  Standing heel raises 20 x 2  Gastroc stretch Standing hip abduction and extension with green  at  knees 2 x 10 each Standing hip flexion 10 x 2 each  Knee extension 15 x 2 15# ,  Knee flexion 35# 15 x 2 STS 10 x 3 with rest breaks, cues for controlled descent     [facility] Adult PT Treatment:                                                DATE: 10/02/2022 Therapeutic Exercise: NuStep L6 x 5 min with UE/LE while taking subjective Sit to  stand 3 x 10 Standing hip abduction and extension with red at knees 2 x 15 each Knee extension and flexion machine 15# 3 x 10 each Forward 6" step-up 2 x 10 each  Standing heel raises 2 x 20   [facility] Adult PT Treatment:                                                DATE: 09/27/2022 Therapeutic Exercise: NuStep L6 x 5 min with UE/LE while taking subjective Sit to stand x 10, x 7, x 6 LAQ with 5# 3 x 10 Standing hip abduction and extension with red at knees 2 x 10 each Standing heel raises 2 x 10 Row with FM both handles 10# each 3 x 15 Lateral stepping with FM 10# x 2 lengths each Neuro Re-education: Romberg on Airex with head turns and nods 2 x 10 each Tandem stance 2 x 30 sec each   PATIENT EDUCATION:  Education details: HEP Person educated: Patient Education method: Programmer, multimedia, Demonstration, Actor cues, Verbal cues Education comprehension: verbalized understanding, returned demonstration, verbal cues required, tactile cues required, and needs further education  HOME EXERCISE PROGRAM: Access Code: [email or web address]    ASSESSMENT: CLINICAL IMPRESSION: Patient tolerated therapy well with no adverse effects. Therapy focused primarily on strengthening for LE this visit. He was able to perform sit to stands with better tolerance this visit but did report leg fatigue and weakness toward end of session. Increased resistance with standing hip therex tolerated well. No changes made to his HEP this visit, he was instructed to increase reps if exercises become easier. Patient would benefit from continued skilled PT to progress his mobility, strength, and  balance in order to improve confidence with walking and maximize functional ability.   OBJECTIVE IMPAIRMENTS: Abnormal gait, decreased activity tolerance, decreased balance, decreased endurance, decreased strength, postural dysfunction, and pain.   ACTIVITY LIMITATIONS: lifting, squatting, and locomotion level  PARTICIPATION LIMITATIONS: shopping and community activity  PERSONAL FACTORS: Fitness, Past/current experiences, and Time since onset of injury/illness/exacerbation are also affecting patient's functional outcome.    GOALS: Goals reviewed with patient? Yes  SHORT TERM GOALS: Target date: 10/15/2022  Patient will be I with initial HEP in order to progress with therapy. Baseline: HEP provided at eval Goal status: INITIAL  2.  Patient will perform 5xSTS </= 12 seconds to indicate improved strength and reduced fall risk Baseline: 15 seconds 10/02/2022: 12 seconds Goal status: MET  3.  Patient will perform TUG </= 9 seconds to indicate improved mobility and confidence with walking Baseline: 12 seconds Goal status: INITIAL  LONG TERM GOALS: Target date: 11/12/2022  Patient will be I with final HEP to maintain progress from PT. Baseline:  Goal status: INITIAL  2.  Patient will report >/= 58% status on FOTO to indicate improved functional ability. Baseline: 52% Goal status: INITIAL  3.  Patient will perform >/= 900 ft in order to improve community ambulation Baseline: 555 ft Goal status: INITIAL  4.  Patient will demonstrate hip strength >/= 4/5 MMT and knee strength 5/5 MMT to improve his walking tolerance and steadiness Baseline: hip strength grossly 3/4 MMT and knee strength grossly 4+/5 MMT Goal status: INITIAL   PLAN: PT FREQUENCY: 1-2x/week  PT DURATION: 8 weeks  PLANNED INTERVENTIONS: Therapeutic exercises, Therapeutic activity, Neuromuscular re-education, Balance training, Gait training, Patient/Family education, Self Care, Joint  mobilization, Aquatic  Therapy, Manual therapy, and Re-evaluation  PLAN FOR NEXT SESSION: Review HEP and progress PRN, focus on LE and postural strengthening, endurance training, balance training   Jannette Spanner, PTA 10/04/22 1:57 PM Phone: [phone] Fax: [phone]

## 2022-10-05 NOTE — Therapy (Signed)
OUTPATIENT PHYSICAL THERAPY TREATMENT   Patient Name: [name] MRN: [id number] DOB:[birth year], 82 y.o., male Today's Date: 10/08/2022   END OF SESSION:  PT End of Session - 10/08/22 1405     Visit Number 5    Number of Visits 17    Date for PT Re-Evaluation 11/12/22    Authorization Type UHC MCR    Progress Note Due on Visit 10    PT Start Time 1400    PT Stop Time 1440    PT Time Calculation (min) 40 min    Activity Tolerance Patient tolerated treatment well    Behavior During Therapy [facility] for tasks assessed/performed                Past Medical History:  Diagnosis Date   A-fib (HCC)    Allergy    Seasonal--pollen   Arrhythmia    CAD (coronary artery disease) 1999   RCA stent   Carotid stenosis, asymptomatic    moderate RT and mild LT with carotid dopplers 10/26/11   Colon cancer (HCC) 04/2006   Stage III--T3 N1   COPD (chronic obstructive pulmonary disease) (HCC)    H/O cardiovascular stress test 01/13/2010   low risk scan, similar to 2008 study   H/O echocardiogram 05/23/2009   EF >55%, mild MR, Mild TR,    Hand foot syndrome    Hemorrhoids    Hyperlipidemia    Hypertension    Neuropathy    Permanent atrial fibrillation (HCC)    Psoriasis    Tubular adenoma of colon    Past Surgical History:  Procedure Laterality Date   ABDOMINAL AORTOGRAM W/LOWER EXTREMITY N/A 12/21/2021   Procedure: ABDOMINAL AORTOGRAM W/LOWER EXTREMITY;  Surgeon: [name], MD;  [facility];  Service: Cardiovascular;  Laterality: N/A;   CARDIAC CATHETERIZATION     CLIPPING OF ATRIAL APPENDAGE  01/28/2018   Procedure: CLIPPING OF ATRIAL APPENDAGE;  Surgeon: Alleen Borne, MD;  Location: [facility] OR;  Service: Open Heart Surgery;;   COLONOSCOPY     CORONARY ARTERY BYPASS GRAFT N/A 01/28/2018   Procedure: CORONARY ARTERY BYPASS GRAFTING (CABG) X 4 USING LEFT INTERNAL MAMMARY ARTERY AND LEFT GREATER SAPHENOUS VEIN HARVESTED ENDOSCOPICALLY;  Surgeon: Alleen Borne, MD;  Location: [facility];  Service: Open Heart Surgery;  Laterality: N/A;   CORONARY STENT PLACEMENT  1999   RCA tandem NIR stents   LEFT HEART CATH AND CORONARY ANGIOGRAPHY N/A 01/23/2018   Procedure: LEFT HEART CATH AND CORONARY ANGIOGRAPHY;  Surgeon: Yvonne Kendall, MD;  [facility];  Service: Cardiovascular;  Laterality: N/A;   PERIPHERAL VASCULAR ATHERECTOMY Bilateral 12/21/2021   Procedure: PERIPHERAL VASCULAR ATHERECTOMY;  Surgeon: [name], MD;  [facility];  Service: Cardiovascular;  Laterality: Bilateral;  common illiac   PERIPHERAL VASCULAR INTERVENTION Bilateral 12/21/2021   Procedure: PERIPHERAL VASCULAR INTERVENTION;  Surgeon: [name], MD;  [facility];  Service: Cardiovascular;  Laterality: Bilateral;  Common Illiacs   RIGHT COLECTOMY  05/29/2006   Laparoscopic assisted   RIGHT HEART CATH N/A 06/14/2021   Procedure: RIGHT HEART CATH;  Surgeon: Dolores Patty, MD;  [facility];  Service: Cardiovascular;  Laterality: N/A;   TEE WITHOUT CARDIOVERSION N/A 01/28/2018   Procedure: TRANSESOPHAGEAL ECHOCARDIOGRAM (TEE);  Surgeon: Alleen Borne, MD;  Location: [facility] OR;  Service: Open Heart Surgery;  Laterality: N/A;   TONSILLECTOMY     Patient Active Problem List  Diagnosis Date Noted   Atherosclerotic heart disease of native coronary artery with other forms of angina pectoris (HCC) 09/12/2022   Chronic diastolic heart failure (HCC) 09/12/2022   Near syncope 08/26/2022   Hyponatremia 08/26/2022   Symptomatic bradycardia 08/26/2022   Claudication in peripheral vascular disease (HCC) 12/21/2021   Obstructive sleep apnea 07/04/2021   Peripheral arterial disease (HCC) 04/25/2021   Pulmonary hypertension, unspecified (HCC) 03/22/2021   Edema of leg 02/26/2018   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   NSTEMI (non-ST elevated myocardial infarction) (HCC) 01/21/2018   History of colon cancer 08/19/2013    Essential hypertension 11/20/2012   Dyslipidemia, goal LDL below 70 11/20/2012   Chronic anticoagulation    Carotid stenosis, asymptomatic    CAD S/P percutaneous coronary angioplasty    Atrial fibrillation with slow ventricular response (HCC) 06/02/2012    PCP: Thana Ates, MD  REFERRING PROVIDER: Osvaldo Shipper, MD  REFERRING DIAG: Muscle weakness, Other abnormalities of gait and mobility, Unsteadiness on feet, Difficulty in walking, not elsewhere classified  THERAPY DIAG:  Muscle weakness (generalized)  Other abnormalities of gait and mobility  Rationale for Evaluation and Treatment: Rehabilitation  ONSET DATE: Chronic, about 2 years    SUBJECTIVE:  SUBJECTIVE STATEMENT: Patient reports he is doing well, a little underslept and his legs a little tired today.   PAIN:  Are you having pain? No  PERTINENT HISTORY: See cardiac history above  PRECAUTIONS: Fall  WEIGHT BEARING RESTRICTIONS: No  PATIENT GOALS: Get stronger and improve walking   OBJECTIVE:  PATIENT SURVEYS:  FOTO 52% functional status  POSTURE:   Rounded shoulder posture  LOWER EXTREMITY MMT:  MMT Right eval Left eval  Hip flexion 4 4  Hip extension 3 3  Hip abduction 3 3  Hip adduction    Hip internal rotation    Hip external rotation    Knee flexion 4+ 4+  Knee extension 4+ 4+  Ankle dorsiflexion    Ankle plantarflexion    Ankle inversion    Ankle eversion     (Blank rows = not tested)  FUNCTIONAL TESTS:  5 times sit to stand: 15 seconds  10/02/2022: 12 seconds Timed up and go (TUG): 12 seconds 6 minute walk test: 555 ft- 2 min rest break at 3 min  GAIT: Distance walked: 555 ft Assistive device utilized: None Level of assistance: Complete Independence Comments: Decreased gait speed and step length, occasional unsteadiness   TODAY'S TREATMENT:     [facility] Adult PT Treatment:                                                DATE: 10/08/2022 Therapeutic Exercise: NuStep L6 x 5  min with UE/LE while taking subjective Sit to stand 3 x 10 Forward 8" step-up 2 x 10 each  Resisted lateral stepping with FM 10# x 4 lengths each Standing hip abduction and extension with red at knees 2 x 15 each Tandem stance x 30 sec each - d/c'd due to patient reporting fatigue Knee extension machine 15# 3 x 15 each   [facility] Adult PT Treatment:  DATE: 10/04/22 Therapeutic Exercise: Nustep L6 x 5 min with UE/LE  Standing heel raises 20 x 2  Gastroc stretch Standing hip abduction and extension with green  at knees 2 x 10 each Standing hip flexion 10 x 2 each  Knee extension 15 x 2 15# ,  Knee flexion 35# 15 x 2 STS 10 x 3 with rest breaks, cues for controlled descent   [facility] Adult PT Treatment:                                                DATE: 10/02/2022 Therapeutic Exercise: NuStep L6 x 5 min with UE/LE while taking subjective Sit to stand 3 x 10 Standing hip abduction and extension with red at knees 2 x 15 each Knee extension and flexion machine 15# 3 x 10 each Forward 6" step-up 2 x 10 each  Standing heel raises 2 x 20  [facility] Adult PT Treatment:                                                DATE: 09/27/2022 Therapeutic Exercise: NuStep L6 x 5 min with UE/LE while taking subjective Sit to stand x 10, x 7, x 6 LAQ with 5# 3 x 10 Standing hip abduction and extension with red at knees 2 x 10 each Standing heel raises 2 x 10 Row with FM both handles 10# each 3 x 15 Lateral stepping with FM 10# x 2 lengths each Neuro Re-education: Romberg on Airex with head turns and nods 2 x 10 each Tandem stance 2 x 30 sec each  PATIENT EDUCATION:  Education details: HEP Person educated: Patient Education method: Programmer, multimedia, Demonstration, Actor cues, Verbal cues Education comprehension: verbalized understanding, returned demonstration, verbal cues required, tactile cues required, and needs further education  HOME EXERCISE PROGRAM: Access  Code: [email or web address]    ASSESSMENT: CLINICAL IMPRESSION: Patient tolerated therapy well with no adverse effects. Therapy continues to focus on progressing LE strength. He does require frequent rest breaks with exercise and he reported feeling week and tired with tandem stance so discontinued exercises. No changes were made to HEP this visit. Patient would benefit from continued skilled PT to progress his mobility, strength, and balance in order to improve confidence with walking and maximize functional ability.    OBJECTIVE IMPAIRMENTS: Abnormal gait, decreased activity tolerance, decreased balance, decreased endurance, decreased strength, postural dysfunction, and pain.   ACTIVITY LIMITATIONS: lifting, squatting, and locomotion level  PARTICIPATION LIMITATIONS: shopping and community activity  PERSONAL FACTORS: Fitness, Past/current experiences, and Time since onset of injury/illness/exacerbation are also affecting patient's functional outcome.    GOALS: Goals reviewed with patient? Yes  SHORT TERM GOALS: Target date: 10/15/2022  Patient will be I with initial HEP in order to progress with therapy. Baseline: HEP provided at eval Goal status: INITIAL  2.  Patient will perform 5xSTS </= 12 seconds to indicate improved strength and reduced fall risk Baseline: 15 seconds 10/02/2022: 12 seconds Goal status: MET  3.  Patient will perform TUG </= 9 seconds to indicate improved mobility and confidence with walking Baseline: 12 seconds Goal status: INITIAL  LONG TERM GOALS: Target date: 11/12/2022  Patient will be I with final HEP to maintain progress from PT. Baseline:  Goal status: INITIAL  2.  Patient will report >/= 58% status on FOTO to indicate improved functional ability. Baseline: 52% Goal status: INITIAL  3.  Patient will perform >/= 900 ft in order to improve community ambulation Baseline: 555 ft Goal status: INITIAL  4.  Patient will demonstrate hip strength >/= 4/5  MMT and knee strength 5/5 MMT to improve his walking tolerance and steadiness Baseline: hip strength grossly 3/4 MMT and knee strength grossly 4+/5 MMT Goal status: INITIAL   PLAN: PT FREQUENCY: 1-2x/week  PT DURATION: 8 weeks  PLANNED INTERVENTIONS: Therapeutic exercises, Therapeutic activity, Neuromuscular re-education, Balance training, Gait training, Patient/Family education, Self Care, Joint mobilization, Aquatic Therapy, Manual therapy, and Re-evaluation  PLAN FOR NEXT SESSION: Review HEP and progress PRN, focus on LE and postural strengthening, endurance training, balance training   Rosana Hoes, PT, DPT, LAT, ATC 10/08/22  2:53 PM Phone: [phone] Fax: [phone]

## 2022-10-08 ENCOUNTER — Encounter: Payer: Self-pay | Admitting: Physical Therapy

## 2022-10-08 ENCOUNTER — Other Ambulatory Visit: Payer: Self-pay

## 2022-10-08 ENCOUNTER — Ambulatory Visit: Payer: Medicare Other | Admitting: Physical Therapy

## 2022-10-08 ENCOUNTER — Telehealth: Payer: Self-pay

## 2022-10-08 DIAGNOSIS — I4891 Unspecified atrial fibrillation: Secondary | ICD-10-CM | POA: Diagnosis not present

## 2022-10-08 DIAGNOSIS — I10 Essential (primary) hypertension: Secondary | ICD-10-CM | POA: Diagnosis not present

## 2022-10-08 DIAGNOSIS — R2689 Other abnormalities of gait and mobility: Secondary | ICD-10-CM

## 2022-10-08 DIAGNOSIS — M6281 Muscle weakness (generalized): Secondary | ICD-10-CM | POA: Diagnosis not present

## 2022-10-08 NOTE — Telephone Encounter (Addendum)
   Cardiac Monitor Alert  Date of alert:  10/08/2022   Patient Name: [name]  DOB: [birth year]  MRN: [id number]   [facility] Cardiologist: Nanetta Batty, MD  [facility] EP:  None    Monitor Information: Long Term Monitor [ZioXT]  Reason:  Dizziness and near syncope Ordering provider:  Dr. Allyson Sabal  Alert  3 ventricular tachycardia runs occurred, there run with the fastest interval lasting 12 beats with max rate of 144 bpm, the longest lasting 12 beats with avg rate of 108 bpm. Atrial fibrillation occurred continuously  100% burden, ranging from 32-101 bpm.  Other: Left voicemail message for the patient to call the clinic.  Asencion Gowda, LPN  05/24/6576 46:96 AM

## 2022-10-09 ENCOUNTER — Ambulatory Visit: Payer: Medicare Other | Admitting: Physical Therapy

## 2022-10-11 ENCOUNTER — Ambulatory Visit: Payer: Medicare Other | Admitting: Physical Therapy

## 2022-10-11 ENCOUNTER — Encounter: Payer: Self-pay | Admitting: Physical Therapy

## 2022-10-11 ENCOUNTER — Other Ambulatory Visit: Payer: Self-pay

## 2022-10-11 DIAGNOSIS — R2689 Other abnormalities of gait and mobility: Secondary | ICD-10-CM

## 2022-10-11 DIAGNOSIS — M6281 Muscle weakness (generalized): Secondary | ICD-10-CM | POA: Diagnosis not present

## 2022-10-11 NOTE — Therapy (Signed)
OUTPATIENT PHYSICAL THERAPY TREATMENT   Patient Name: [name] MRN: [id number] DOB:06-17-1940, 82 y.o., male Today's Date: 10/11/2022   END OF SESSION:  PT End of Session - 10/11/22 1106     Visit Number 6    Number of Visits 17    Date for PT Re-Evaluation 11/12/22    Authorization Type UHC MCR    Progress Note Due on Visit 10    PT Start Time 1100    PT Stop Time 1145    PT Time Calculation (min) 45 min    Activity Tolerance Patient tolerated treatment well    Behavior During Therapy [facility] for tasks assessed/performed                 Past Medical History:  Diagnosis Date   A-fib (HCC)    Allergy    Seasonal--pollen   Arrhythmia    CAD (coronary artery disease) 1999   RCA stent   Carotid stenosis, asymptomatic    moderate RT and mild LT with carotid dopplers 10/26/11   Colon cancer (HCC) 04/2006   Stage III--T3 N1   COPD (chronic obstructive pulmonary disease) (HCC)    H/O cardiovascular stress test 01/13/2010   low risk scan, similar to 2008 study   H/O echocardiogram 05/23/2009   EF >55%, mild MR, Mild TR,    Hand foot syndrome    Hemorrhoids    Hyperlipidemia    Hypertension    Neuropathy    Permanent atrial fibrillation (HCC)    Psoriasis    Tubular adenoma of colon    Past Surgical History:  Procedure Laterality Date   ABDOMINAL AORTOGRAM W/LOWER EXTREMITY N/A 12/21/2021   Procedure: ABDOMINAL AORTOGRAM W/LOWER EXTREMITY;  Surgeon: [name], MD;  [facility];  Service: Cardiovascular;  Laterality: N/A;   CARDIAC CATHETERIZATION     CLIPPING OF ATRIAL APPENDAGE  01/28/2018   Procedure: CLIPPING OF ATRIAL APPENDAGE;  Surgeon: Alleen Borne, MD;  [facility]: [facility] OR;  Service: Open Heart Surgery;;   COLONOSCOPY     CORONARY ARTERY BYPASS GRAFT N/A 01/28/2018   Procedure: CORONARY ARTERY BYPASS GRAFTING (CABG) X 4 USING LEFT INTERNAL MAMMARY ARTERY AND LEFT GREATER SAPHENOUS VEIN HARVESTED ENDOSCOPICALLY;  Surgeon:  Alleen Borne, MD;  Location: [facility];  Service: Open Heart Surgery;  Laterality: N/A;   CORONARY STENT PLACEMENT  1999   RCA tandem NIR stents   LEFT HEART CATH AND CORONARY ANGIOGRAPHY N/A 01/23/2018   Procedure: LEFT HEART CATH AND CORONARY ANGIOGRAPHY;  Surgeon: Yvonne Kendall, MD;  [facility];  Service: Cardiovascular;  Laterality: N/A;   PERIPHERAL VASCULAR ATHERECTOMY Bilateral 12/21/2021   Procedure: PERIPHERAL VASCULAR ATHERECTOMY;  Surgeon: [name], MD;  [facility];  Service: Cardiovascular;  Laterality: Bilateral;  common illiac   PERIPHERAL VASCULAR INTERVENTION Bilateral 12/21/2021   Procedure: PERIPHERAL VASCULAR INTERVENTION;  Surgeon: [name], MD;  [facility];  Service: Cardiovascular;  Laterality: Bilateral;  Common Illiacs   RIGHT COLECTOMY  05/29/2006   Laparoscopic assisted   RIGHT HEART CATH N/A 06/14/2021   Procedure: RIGHT HEART CATH;  Surgeon: Dolores Patty, MD;  [facility];  Service: Cardiovascular;  Laterality: N/A;   TEE WITHOUT CARDIOVERSION N/A 01/28/2018   Procedure: TRANSESOPHAGEAL ECHOCARDIOGRAM (TEE);  Surgeon: Alleen Borne, MD;  Location: [facility] OR;  Service: Open Heart Surgery;  Laterality: N/A;   TONSILLECTOMY     Patient Active Problem  List   Diagnosis Date Noted   Atherosclerotic heart disease of native coronary artery with other forms of angina pectoris (HCC) 09/12/2022   Chronic diastolic heart failure (HCC) 09/12/2022   Near syncope 08/26/2022   Hyponatremia 08/26/2022   Symptomatic bradycardia 08/26/2022   Claudication in peripheral vascular disease (HCC) 12/21/2021   Obstructive sleep apnea 07/04/2021   Peripheral arterial disease (HCC) 04/25/2021   Pulmonary hypertension, unspecified (HCC) 03/22/2021   Edema of leg 02/26/2018   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   NSTEMI (non-ST elevated myocardial infarction) (HCC) 01/21/2018   History of colon cancer  08/19/2013   Essential hypertension 11/20/2012   Dyslipidemia, goal LDL below 70 11/20/2012   Chronic anticoagulation    Carotid stenosis, asymptomatic    CAD S/P percutaneous coronary angioplasty    Atrial fibrillation with slow ventricular response (HCC) 06/02/2012    PCP: Thana Ates, MD  REFERRING PROVIDER: Osvaldo Shipper, MD  REFERRING DIAG: Muscle weakness, Other abnormalities of gait and mobility, Unsteadiness on feet, Difficulty in walking, not elsewhere classified  THERAPY DIAG:  Muscle weakness (generalized)  Other abnormalities of gait and mobility  Rationale for Evaluation and Treatment: Rehabilitation  ONSET DATE: Chronic, about 2 years    SUBJECTIVE:  SUBJECTIVE STATEMENT: Patient reports he is doing well, no new issues.   PAIN:  Are you having pain? No  PERTINENT HISTORY: See cardiac history above  PRECAUTIONS: Fall  WEIGHT BEARING RESTRICTIONS: No  PATIENT GOALS: Get stronger and improve walking   OBJECTIVE:  PATIENT SURVEYS:  FOTO 52% functional status  10/11/2022: 47%  POSTURE:   Rounded shoulder posture  LOWER EXTREMITY MMT:  MMT Right eval Left eval  Hip flexion 4 4  Hip extension 3 3  Hip abduction 3 3  Hip adduction    Hip internal rotation    Hip external rotation    Knee flexion 4+ 4+  Knee extension 4+ 4+  Ankle dorsiflexion    Ankle plantarflexion    Ankle inversion    Ankle eversion     (Blank rows = not tested)  FUNCTIONAL TESTS:  5 times sit to stand: 15 seconds  10/02/2022: 12 seconds Timed up and go (TUG): 12 seconds 6 minute walk test: 555 ft- 2 min rest break at 3 min  GAIT: Distance walked: 555 ft Assistive device utilized: None Level of assistance: Complete Independence Comments: Decreased gait speed and step length, occasional unsteadiness   TODAY'S TREATMENT:     [facility] Adult PT Treatment:                                                DATE: 10/11/2022 Therapeutic Exercise: NuStep L7 x 6 min with  UE/LE while taking subjective Leg press (cybex) 60# x 10, 100# 4 x 6 SLR 2 x 10 each Bridge x 10, x 4 - patient reports hamstring cramping so d/c'd exercise Sidelying hip abduction 2 x 10 each Seated hamstring stretch 2 x 20 sec each Standing heel raises 2 x 15  Standing hip extension 2 x 15 each Therapeutic Activity: FOTO reassessment to assess progress toward goal   [facility] Adult PT Treatment:  DATE: 10/08/2022 Therapeutic Exercise: NuStep L6 x 5 min with UE/LE while taking subjective Sit to stand 3 x 10 Forward 8" step-up 2 x 10 each  Resisted lateral stepping with FM 10# x 4 lengths each Standing hip abduction and extension with red at knees 2 x 15 each Tandem stance x 30 sec each - d/c'd due to patient reporting fatigue Knee extension machine 15# 3 x 15 each  [facility] Adult PT Treatment:                                                DATE: 10/04/22 Therapeutic Exercise: Nustep L6 x 5 min with UE/LE  Standing heel raises 20 x 2  Gastroc stretch Standing hip abduction and extension with green  at knees 2 x 10 each Standing hip flexion 10 x 2 each  Knee extension 15 x 2 15# ,  Knee flexion 35# 15 x 2 STS 10 x 3 with rest breaks, cues for controlled descent   [facility] Adult PT Treatment:                                                DATE: 10/02/2022 Therapeutic Exercise: NuStep L6 x 5 min with UE/LE while taking subjective Sit to stand 3 x 10 Standing hip abduction and extension with red at knees 2 x 15 each Knee extension and flexion machine 15# 3 x 10 each Forward 6" step-up 2 x 10 each  Standing heel raises 2 x 20  [facility] Adult PT Treatment:                                                DATE: 09/27/2022 Therapeutic Exercise: NuStep L6 x 5 min with UE/LE while taking subjective Sit to stand x 10, x 7, x 6 LAQ with 5# 3 x 10 Standing hip abduction and extension with red at knees 2 x 10 each Standing heel raises 2 x 10 Row with FM both  handles 10# each 3 x 15 Lateral stepping with FM 10# x 2 lengths each Neuro Re-education: Romberg on Airex with head turns and nods 2 x 10 each Tandem stance 2 x 30 sec each  PATIENT EDUCATION:  Education details: HEP Person educated: Patient Education method: Programmer, multimedia, Demonstration, Actor cues, Verbal cues Education comprehension: verbalized understanding, returned demonstration, verbal cues required, tactile cues required, and needs further education  HOME EXERCISE PROGRAM: Access Code: [email or web address]    ASSESSMENT: CLINICAL IMPRESSION: Patient tolerated therapy well with no adverse effects. Therapy focused on progressing LE strength with good tolerance. Incorporated leg press exercise and patient did report some left knee discomfort but able to complete all sets. He did experience hamstring cramping with bridge exercise so incorporated hamstring stretching that helped alleviate tightness. He does report a reduction in functional status on FOTO but subjectively reports improvement with therapy. No changes to HEP this visit. Patient would benefit from continued skilled PT to progress his mobility, strength, and balance in order to improve confidence with walking and maximize functional ability.    OBJECTIVE IMPAIRMENTS: Abnormal gait, decreased activity tolerance, decreased balance,  decreased endurance, decreased strength, postural dysfunction, and pain.   ACTIVITY LIMITATIONS: lifting, squatting, and locomotion level  PARTICIPATION LIMITATIONS: shopping and community activity  PERSONAL FACTORS: Fitness, Past/current experiences, and Time since onset of injury/illness/exacerbation are also affecting patient's functional outcome.    GOALS: Goals reviewed with patient? Yes  SHORT TERM GOALS: Target date: 10/15/2022  Patient will be I with initial HEP in order to progress with therapy. Baseline: HEP provided at eval 10/11/2022: independent Goal status: MET  2.  Patient will  perform 5xSTS </= 12 seconds to indicate improved strength and reduced fall risk Baseline: 15 seconds 10/02/2022: 12 seconds Goal status: MET  3.  Patient will perform TUG </= 9 seconds to indicate improved mobility and confidence with walking Baseline: 12 seconds Goal status: INITIAL  LONG TERM GOALS: Target date: 11/12/2022  Patient will be I with final HEP to maintain progress from PT. Baseline:  Goal status: INITIAL  2.  Patient will report >/= 58% status on FOTO to indicate improved functional ability. Baseline: 52% 10/11/2022: 47% Goal status: ONGOING  3.  Patient will perform >/= 900 ft in order to improve community ambulation Baseline: 555 ft Goal status: INITIAL  4.  Patient will demonstrate hip strength >/= 4/5 MMT and knee strength 5/5 MMT to improve his walking tolerance and steadiness Baseline: hip strength grossly 3/4 MMT and knee strength grossly 4+/5 MMT Goal status: INITIAL   PLAN: PT FREQUENCY: 1-2x/week  PT DURATION: 8 weeks  PLANNED INTERVENTIONS: Therapeutic exercises, Therapeutic activity, Neuromuscular re-education, Balance training, Gait training, Patient/Family education, Self Care, Joint mobilization, Aquatic Therapy, Manual therapy, and Re-evaluation  PLAN FOR NEXT SESSION: Review HEP and progress PRN, focus on LE and postural strengthening, endurance training, balance training   Rosana Hoes, PT, DPT, LAT, ATC 10/11/22  11:45 AM Phone: [phone] Fax: [phone]

## 2022-10-15 NOTE — Therapy (Signed)
OUTPATIENT PHYSICAL THERAPY TREATMENT   Patient Name: [name] MRN: [id number] DOB:1940-05-21, 82 y.o., male Today's Date: 10/16/2022   END OF SESSION:  PT End of Session - 10/16/22 1404     Visit Number 7    Number of Visits 17    Date for PT Re-Evaluation 11/12/22    Authorization Type [employer] MCR    Progress Note Due on Visit 10    PT Start Time 1401    PT Stop Time 1443    PT Time Calculation (min) 42 min    Activity Tolerance Patient tolerated treatment well    Behavior During Therapy WFL for tasks assessed/performed                  Past Medical History:  Diagnosis Date   A-fib (HCC)    Allergy    Seasonal--pollen   Arrhythmia    CAD (coronary artery disease) 1999   RCA stent   Carotid stenosis, asymptomatic    moderate RT and mild LT with carotid dopplers 10/26/11   Colon cancer (HCC) 04/2006   Stage III--T3 N1   COPD (chronic obstructive pulmonary disease) (HCC)    H/O cardiovascular stress test 01/13/2010   low risk scan, similar to 2008 study   H/O echocardiogram 05/23/2009   EF >55%, mild MR, Mild TR,    Hand foot syndrome    Hemorrhoids    Hyperlipidemia    Hypertension    Neuropathy    Permanent atrial fibrillation (HCC)    Psoriasis    Tubular adenoma of colon    Past Surgical History:  Procedure Laterality Date   ABDOMINAL AORTOGRAM W/LOWER EXTREMITY N/A 12/21/2021   Procedure: ABDOMINAL AORTOGRAM W/LOWER EXTREMITY;  Surgeon: [name], MD;  Location: [facility];  Service: Cardiovascular;  Laterality: N/A;   CARDIAC CATHETERIZATION     CLIPPING OF ATRIAL APPENDAGE  01/28/2018   Procedure: CLIPPING OF ATRIAL APPENDAGE;  Surgeon: Alleen Borne, MD;  Location: [facility] OR;  Service: Open Heart Surgery;;   COLONOSCOPY     CORONARY ARTERY BYPASS GRAFT N/A 01/28/2018   Procedure: CORONARY ARTERY BYPASS GRAFTING (CABG) X 4 USING LEFT INTERNAL MAMMARY ARTERY AND LEFT GREATER SAPHENOUS VEIN HARVESTED ENDOSCOPICALLY;  Surgeon:  Alleen Borne, MD;  Location: [facility] OR;  Service: Open Heart Surgery;  Laterality: N/A;   CORONARY STENT PLACEMENT  1999   RCA tandem NIR stents   LEFT HEART CATH AND CORONARY ANGIOGRAPHY N/A 01/23/2018   Procedure: LEFT HEART CATH AND CORONARY ANGIOGRAPHY;  Surgeon: Yvonne Kendall, MD;  Location: [facility];  Service: Cardiovascular;  Laterality: N/A;   PERIPHERAL VASCULAR ATHERECTOMY Bilateral 12/21/2021   Procedure: PERIPHERAL VASCULAR ATHERECTOMY;  Surgeon: [name], MD;  Location: [facility];  Service: Cardiovascular;  Laterality: Bilateral;  common illiac   PERIPHERAL VASCULAR INTERVENTION Bilateral 12/21/2021   Procedure: PERIPHERAL VASCULAR INTERVENTION;  Surgeon: [name], MD;  Location: [facility];  Service: Cardiovascular;  Laterality: Bilateral;  Common Illiacs   RIGHT COLECTOMY  05/29/2006   Laparoscopic assisted   RIGHT HEART CATH N/A 06/14/2021   Procedure: RIGHT HEART CATH;  Surgeon: Dolores Patty, MD;  Location: [facility];  Service: Cardiovascular;  Laterality: N/A;   TEE WITHOUT CARDIOVERSION N/A 01/28/2018   Procedure: TRANSESOPHAGEAL ECHOCARDIOGRAM (TEE);  Surgeon: Alleen Borne, MD;  Location: [facility];  Service: Open Heart Surgery;  Laterality: N/A;   TONSILLECTOMY     Patient Active  Problem List   Diagnosis Date Noted   Atherosclerotic heart disease of native coronary artery with other forms of angina pectoris (HCC) 09/12/2022   Chronic diastolic heart failure (HCC) 09/12/2022   Near syncope 08/26/2022   Hyponatremia 08/26/2022   Symptomatic bradycardia 08/26/2022   Claudication in peripheral vascular disease (HCC) 12/21/2021   Obstructive sleep apnea 07/04/2021   Peripheral arterial disease (HCC) 04/25/2021   Pulmonary hypertension, unspecified (HCC) 03/22/2021   Edema of leg 02/26/2018   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   NSTEMI (non-ST elevated myocardial infarction) (HCC) 01/21/2018   History of colon cancer  08/19/2013   Essential hypertension 11/20/2012   Dyslipidemia, goal LDL below 70 11/20/2012   Chronic anticoagulation    Carotid stenosis, asymptomatic    CAD S/P percutaneous coronary angioplasty    Atrial fibrillation with slow ventricular response (HCC) 06/02/2012    PCP: Thana Ates, MD  REFERRING PROVIDER: Osvaldo Shipper, MD  REFERRING DIAG: Muscle weakness, Other abnormalities of gait and mobility, Unsteadiness on feet, Difficulty in walking, not elsewhere classified  THERAPY DIAG:  Muscle weakness (generalized)  Other abnormalities of gait and mobility  Rationale for Evaluation and Treatment: Rehabilitation  ONSET DATE: Chronic, about 2 years    SUBJECTIVE:  SUBJECTIVE STATEMENT: Patient reports he is tired today from being busy and having doctors appointments this morning.  PAIN:  Are you having pain? No  PERTINENT HISTORY: See cardiac history above  PRECAUTIONS: Fall  WEIGHT BEARING RESTRICTIONS: No  PATIENT GOALS: Get stronger and improve walking   OBJECTIVE:  PATIENT SURVEYS:  FOTO 52% functional status  10/11/2022: 47%  POSTURE:   Rounded shoulder posture  LOWER EXTREMITY MMT:  MMT Right eval Left eval  Hip flexion 4 4  Hip extension 3 3  Hip abduction 3 3  Hip adduction    Hip internal rotation    Hip external rotation    Knee flexion 4+ 4+  Knee extension 4+ 4+  Ankle dorsiflexion    Ankle plantarflexion    Ankle inversion    Ankle eversion     (Blank rows = not tested)  FUNCTIONAL TESTS:  5 times sit to stand: 15 seconds  10/02/2022: 12 seconds Timed up and go (TUG): 12 seconds 6 minute walk test: 555 ft- 2 min rest break at 3 min  GAIT: Distance walked: 555 ft Assistive device utilized: None Level of assistance: Complete Independence Comments: Decreased gait speed and step length, occasional unsteadiness   TODAY'S TREATMENT:     [facility] Adult PT Treatment:                                                DATE:  10/16/2022 Therapeutic Exercise: NuStep L7 x 6 min with UE/LE to improve workload capacity Leg press (cybex) 100# 4 x 8 Resisted lateral stepping with FM 13# x 4 lengths each Knee extension machine 20# x 10, 2 x 15 Knee flexion machine 35# 3 x 15 Sit to stand from mat table x 10   [facility] Adult PT Treatment:                                                DATE: 10/11/2022 Therapeutic Exercise: NuStep L7 x 6 min with  UE/LE while taking subjective Leg press (cybex) 60# x 10, 100# 4 x 6 SLR 2 x 10 each Bridge x 10, x 4 - patient reports hamstring cramping so d/c'd exercise Sidelying hip abduction 2 x 10 each Seated hamstring stretch 2 x 20 sec each Standing heel raises 2 x 15  Standing hip extension 2 x 15 each Therapeutic Activity: FOTO reassessment to assess progress toward goal  [facility] Adult PT Treatment:                                                DATE: 10/08/2022 Therapeutic Exercise: NuStep L6 x 5 min with UE/LE while taking subjective Sit to stand 3 x 10 Forward 8" step-up 2 x 10 each  Resisted lateral stepping with FM 10# x 4 lengths each Standing hip abduction and extension with red at knees 2 x 15 each Tandem stance x 30 sec each - d/c'd due to patient reporting fatigue Knee extension machine 15# 3 x 15 each  [facility] Adult PT Treatment:                                                DATE: 10/04/22 Therapeutic Exercise: Nustep L6 x 5 min with UE/LE  Standing heel raises 20 x 2  Gastroc stretch Standing hip abduction and extension with green  at knees 2 x 10 each Standing hip flexion 10 x 2 each  Knee extension 15 x 2 15# ,  Knee flexion 35# 15 x 2 STS 10 x 3 with rest breaks, cues for controlled descent   [facility] Adult PT Treatment:                                                DATE: 10/02/2022 Therapeutic Exercise: NuStep L6 x 5 min with UE/LE while taking subjective Sit to stand 3 x 10 Standing hip abduction and extension with red at knees 2 x 15 each Knee extension and  flexion machine 15# 3 x 10 each Forward 6" step-up 2 x 10 each  Standing heel raises 2 x 20  PATIENT EDUCATION:  Education details: HEP Person educated: Patient Education method: Programmer, multimedia, Demonstration, Tactile cues, Verbal cues Education comprehension: verbalized understanding, returned demonstration, verbal cues required, tactile cues required, and needs further education  HOME EXERCISE PROGRAM: Access Code: [email or web address]    ASSESSMENT: CLINICAL IMPRESSION: Patient tolerated therapy well with no adverse effects. Therapy continues to focus primarily on progressing his LE strengthening and standing stability. He does continue to report right knee and leg discomfort and weakness with exercises. No changes made to HEP. Patient would benefit from continued skilled PT to progress his mobility, strength, and balance in order to improve confidence with walking and maximize functional ability.    OBJECTIVE IMPAIRMENTS: Abnormal gait, decreased activity tolerance, decreased balance, decreased endurance, decreased strength, postural dysfunction, and pain.   ACTIVITY LIMITATIONS: lifting, squatting, and locomotion level  PARTICIPATION LIMITATIONS: shopping and community activity  PERSONAL FACTORS: Fitness, Past/current experiences, and Time since onset of injury/illness/exacerbation are also affecting patient's functional outcome.    GOALS: Goals reviewed with patient? Yes  SHORT TERM GOALS: Target date: 10/15/2022  Patient will be I with initial HEP in order to progress with therapy. Baseline: HEP provided at eval 10/11/2022: independent Goal status: MET  2.  Patient will perform 5xSTS </= 12 seconds to indicate improved strength and reduced fall risk Baseline: 15 seconds 10/02/2022: 12 seconds Goal status: MET  3.  Patient will perform TUG </= 9 seconds to indicate improved mobility and confidence with walking Baseline: 12 seconds Goal status: INITIAL  LONG TERM GOALS: Target date:  11/12/2022  Patient will be I with final HEP to maintain progress from PT. Baseline:  Goal status: INITIAL  2.  Patient will report >/= 58% status on FOTO to indicate improved functional ability. Baseline: 52% 10/11/2022: 47% Goal status: ONGOING  3.  Patient will perform >/= 900 ft in order to improve community ambulation Baseline: 555 ft Goal status: INITIAL  4.  Patient will demonstrate hip strength >/= 4/5 MMT and knee strength 5/5 MMT to improve his walking tolerance and steadiness Baseline: hip strength grossly 3/4 MMT and knee strength grossly 4+/5 MMT Goal status: INITIAL   PLAN: PT FREQUENCY: 1-2x/week  PT DURATION: 8 weeks  PLANNED INTERVENTIONS: Therapeutic exercises, Therapeutic activity, Neuromuscular re-education, Balance training, Gait training, Patient/Family education, Self Care, Joint mobilization, Aquatic Therapy, Manual therapy, and Re-evaluation  PLAN FOR NEXT SESSION: Review HEP and progress PRN, focus on LE and postural strengthening, endurance training, balance training   Rosana Hoes, PT, DPT, LAT, ATC 10/16/22  2:54 PM Phone: [phone] Fax: [phone]

## 2022-10-16 ENCOUNTER — Encounter: Payer: Self-pay | Admitting: Cardiovascular Disease

## 2022-10-16 ENCOUNTER — Ambulatory Visit: Payer: Medicare Other | Admitting: Physical Therapy

## 2022-10-16 ENCOUNTER — Other Ambulatory Visit: Payer: Self-pay

## 2022-10-16 ENCOUNTER — Ambulatory Visit: Payer: Medicare Other | Attending: Cardiovascular Disease | Admitting: Cardiovascular Disease

## 2022-10-16 ENCOUNTER — Encounter: Payer: Self-pay | Admitting: Physical Therapy

## 2022-10-16 VITALS — BP 128/58 | HR 46 | Ht 75.0 in | Wt 212.2 lb

## 2022-10-16 DIAGNOSIS — I6523 Occlusion and stenosis of bilateral carotid arteries: Secondary | ICD-10-CM | POA: Diagnosis not present

## 2022-10-16 DIAGNOSIS — Z9861 Coronary angioplasty status: Secondary | ICD-10-CM

## 2022-10-16 DIAGNOSIS — I4891 Unspecified atrial fibrillation: Secondary | ICD-10-CM | POA: Diagnosis not present

## 2022-10-16 DIAGNOSIS — I2510 Atherosclerotic heart disease of native coronary artery without angina pectoris: Secondary | ICD-10-CM | POA: Diagnosis not present

## 2022-10-16 DIAGNOSIS — R600 Localized edema: Secondary | ICD-10-CM

## 2022-10-16 DIAGNOSIS — I272 Pulmonary hypertension, unspecified: Secondary | ICD-10-CM

## 2022-10-16 DIAGNOSIS — R2689 Other abnormalities of gait and mobility: Secondary | ICD-10-CM

## 2022-10-16 DIAGNOSIS — I10 Essential (primary) hypertension: Secondary | ICD-10-CM

## 2022-10-16 DIAGNOSIS — G4733 Obstructive sleep apnea (adult) (pediatric): Secondary | ICD-10-CM

## 2022-10-16 DIAGNOSIS — M6281 Muscle weakness (generalized): Secondary | ICD-10-CM

## 2022-10-16 DIAGNOSIS — I739 Peripheral vascular disease, unspecified: Secondary | ICD-10-CM

## 2022-10-16 DIAGNOSIS — E785 Hyperlipidemia, unspecified: Secondary | ICD-10-CM

## 2022-10-16 NOTE — Assessment & Plan Note (Signed)
History of obstructive sleep apnea on BiPAP which she benefits from.

## 2022-10-16 NOTE — Assessment & Plan Note (Signed)
History of peripheral arterial disease status post peripheral angiography performed by myself 12/21/2021 revealing significant bilateral iliac disease.  I performed PTA and stenting using VBX covered stents.  He did have tandem lesions in both SFAs which have not been addressed although he denies claudication.  His most recent Doppler studies performed 06/25/2022 suggest that his stents have remained patent.

## 2022-10-16 NOTE — Assessment & Plan Note (Signed)
History of persistent A-fib with heart rates averaging in the 40s on Eliquis oral anticoagulation.  He is asymptomatic from this.  He is not on any negative chronotropic medications.  At this point, there is no indication for permanent transvenous pacemaker insertion.

## 2022-10-16 NOTE — Assessment & Plan Note (Signed)
History of CAD status post RCA stenting by myself 01/11/1998.  He had normal circumflex and LAD at that time.  He was admitted with a non-STEMI 01/21/2018 underwent cardiac catheterization 2 days later by Dr. Okey Dupre revealing left main disease.  Underwent CABG by Dr. Laneta Simmers 01/28/2018 with a LIMA to his LAD, vein to the obtuse marginal branch, and PDA/PLA sequentially.  Also had left atrial clipping at that time.  He has done well since from a cardiac point of view denying chest pain.

## 2022-10-16 NOTE — Assessment & Plan Note (Signed)
Pulmonary hypertension on 2D echo most recently performed 08/27/2022 with a RV systolic pressure of 54.  He did undergo a right heart cath by Dr. Gala Romney on in the recent past that did not suggest pulmonary hypertension.  He does not TR bowel.

## 2022-10-16 NOTE — Assessment & Plan Note (Signed)
Moderate right ICA stenosis by duplex ultrasound performed 06/25/2022.  This has remained stable.  Will repeat this in April of next year.

## 2022-10-16 NOTE — Patient Instructions (Addendum)
Medication Instructions:  Your physician has recommended you make the following change in your medication:   -Stop taking aspirin.  *If you need a refill on your cardiac medications before your next appointment, please call your pharmacy*   Testing/Procedures: Your physician has requested that you have an Aorta/Iliac Duplex. This will be take place at [address] [facility] [address], [address].  No food after 11PM the night before.  Water is OK. (Don't drink liquids if you have been instructed not to for ANOTHER test) Avoid foods that produce bowel gas, for 24 hours prior to exam (see below). No breakfast, no chewing gum, no smoking or carbonated beverages. Patient may take morning medications with water. Come in for test at least 15 minutes early to register. To be done in April 2025.  Your physician has requested that you have an ankle brachial index (ABI). During this test an ultrasound and blood pressure cuff are used to evaluate the arteries that supply the arms and legs with blood. Allow thirty minutes for this exam. There are no restrictions or special instructions. This will take place at [address] [facility], [address]. To be done in April 2025.  Your physician has requested that you have a carotid duplex. This test is an ultrasound of the carotid arteries in your neck. It looks at blood flow through these arteries that supply the brain with blood. Allow one hour for this exam. There are no restrictions or special instructions. This will take place at [address] [facility], [address]. To be done in April 2025.       Follow-Up: At [facility], you and your health needs are our priority.  As part of our continuing mission to provide you with exceptional heart care, we have created designated Provider Care Teams.  These Care Teams include your primary Cardiologist (physician) and Advanced Practice Providers (APPs -  Physician Assistants and Nurse Practitioners) who all work together to  provide you with the care you need, when you need it.  We recommend signing up for the patient portal called "MyChart".  Sign up information is provided on this After Visit Summary.  MyChart is used to connect with patients for Virtual Visits (Telemedicine).  Patients are able to view lab/test results, encounter notes, upcoming appointments, etc.  Non-urgent messages can be sent to your provider as well.   To learn more about what you can do with MyChart, go to [email or web address].    Your next appointment:   6 month(s)  Provider:   Bernadene Person, NP       Then, Nanetta Batty, MD will plan to see you again in 12 month(s).

## 2022-10-16 NOTE — Assessment & Plan Note (Signed)
Lower extremity edema improved with the addition of diuretics.

## 2022-10-16 NOTE — Assessment & Plan Note (Signed)
History of essential hypertension blood pressure measured today at 128/58.  He is on no antihypertensive medications.

## 2022-10-16 NOTE — Progress Notes (Signed)
10/16/2022 [name]   03/09/1941  [id number]  Primary Physician Thana Ates, MD Primary Cardiologist: [name] MD FACP, [place], [place], [place]  HPI:  LORENSO Keller is a 82 y.o.   mildly overweight, married Caucasian male father of 2, grandfather of 3 grandchildren who I saw  in the office 04/17/2022.[name] He has a history of CAD status post RCA stenting by myself January 11, 1998. He had normal circumflex, LAD and ejection fraction. His other problems include hypertension and chronic atrial fibrillation on Coumadin anticoagulation, rate controlled, as well as erectile dysfunction. He is totally asymptomatic. He did have colon cancer picked up on screening colonoscopy and underwent right colectomy May 29, 2006 followed by 11 cycles of adjuvant Folfox chemotherapy. [name] He is very active exercises 6 days a week doing weight training 3 days a week, and lower extremity training 3 times a week.[name]   He was admitted to the hospital on 01/21/2018 with non-STEMI.  He underwent cardiac catheterization 2 days later by Dr. Okey Dupre revealing left main disease but disease in his LAD and RCA as well.  He underwent CABG by Dr. Laneta Simmers on 01/28/2018 with a LIMA to his LAD, vein to an obtuse marginal branch and to the PDA and PLA sequentially.  He also had left atrial clipping at that time.    He ended up completing cardiac rehab as an outpatient.   He saw Gillian Shields, NP in the office 12/14/2020 who placed him on a low-dose diuretic and obtain a 2D echocardiogram which was performed 12/20/2020 revealing severe TR with pulmonary hypertension which is new for him.  He is fairly active but does complain of dyspnea on exertion which is a new symptom.     His symptoms of dyspnea significantly improved with the addition of a diuretic.  I did go chest CTA that showed no evidence of thromboembolic disease.  In addition, I did lower extremity arterial Doppler studies that show significant iliac disease bilaterally.   He does complain of lifestyle limiting claudication.  He had a right heart cath performed by Dr. Gala Romney 06/14/2021 that showed mild pulmonary hypertension although he had V waves in both the right atrium and the wedge positions suggesting MR and TR.  He had a sleep study that showed severe obstructive sleep apnea.  He does have an appointment with Dr. Gala Romney at the end of May to discuss the right heart findings.  He may need a cardiac MRI and a TEE to further evaluate.  A chest CTA was negative for pulmonary thromboembolic disease as well as a VQ scan.  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   He has lost significant mount of weight as a result of the needed metolazone which I placed him on.  His peripheral edema has completely resolved.  His dyspnea has resolved as well and he feels clinically improved.  He currently denies chest pain or shortness of breath.  His major complaint is lifestyle-limiting claudication with Doppler studies performed 04/02/2021 that revealed bilateral iliac disease .   I performed peripheral angiography on him 12/21/2021 revealing significant bilateral iliac disease which I stented with a 7 mm x 29 mm long VBX covered stents.  He did have tandem lesions in both SFAs.  He was discharged home the following day.  His claudication is markedly improved.  Postprocedural Doppler studies  performed 01/05/2022 revealed the stents to be widely patent.   Since I saw him 6 months ago he continues to do well.  He was admitted to the hospital last month with near syncope and thought to be related to dehydration.  He had a monitor which was placed that revealed A-fib with 100% burden, average heart rate in the 40s with some short runs of nonsustained ventricular tachycardia.  He is rejoined the gym and is going back to exercising.  Since I stented his  iliac arteries 12/21/2021 he denies claudication and his most recent Doppler studies in April suggested that his stents have remained patent.     Current Meds  Medication Sig   Alpha-Lipoic Acid 600 MG TABS Take 1,200 mg by mouth daily.   atorvastatin (LIPITOR) 80 MG tablet TAKE 1 TABLET AT 6PM. (Patient taking differently: Take 80 mg by mouth daily.)   cholecalciferol (VITAMIN D) 1000 UNITS tablet Take 1,000 Units by mouth daily.   clopidogrel (PLAVIX) 75 MG tablet Take 1 tablet (75 mg total) by mouth daily with breakfast.   Coenzyme Q10 200 MG capsule Take 200 mg by mouth daily.    Cyanocobalamin (VITAMIN B 12 PO) Take 200 mcg by mouth daily.   dapagliflozin propanediol (FARXIGA) 10 MG TABS tablet Take 1 tablet (10 mg total) by mouth daily.   ELIQUIS 5 MG TABS tablet TAKE 1 TABLET BY MOUTH TWICE  DAILY   Flaxseed, Linseed, 1000 MG CAPS Take 1,000 mg by mouth daily.   furosemide (LASIX) 40 MG tablet Take 1 tablet (40 mg total) by mouth 2 (two) times daily.   Magnesium Citrate 200 MG TABS Take 400 mg by mouth daily in the afternoon.   metolazone (ZAROXOLYN) 2.5 MG tablet Take 1 tablet by mouth on Monday, Wednesday, and Friday   Polyethyl Glycol-Propyl Glycol (SYSTANE) 0.4-0.3 % GEL ophthalmic gel Place 1 application. into both eyes every 8 (eight) hours as needed (dry eyes).   potassium chloride SA (KLOR-CON M) 20 MEQ tablet Take 1 tablet (20 mEq total) by mouth daily.   pyridoxine (B-6) 200 MG tablet Take 200 mg by mouth daily.   vitamin C (ASCORBIC ACID) 500 MG tablet Take 500 mg by mouth 3 (three) times daily.    [DISCONTINUED] aspirin 81 MG chewable tablet Chew 81 mg by mouth daily.     No Known Allergies  Social History   Socioeconomic History   Marital status: Married    Spouse name: Lattie Corns   Number of children: 2   Years of education: Not on file   Highest education level: Bachelor's degree (e.g., BA, AB, BS)  Occupational History   Occupation: retired    Associate Professor:  RETIRED  Tobacco Use   Smoking status: Former    Current packs/day: 0.00    Types: Cigarettes    Start date: 11/26/1963    Quit date: 11/25/1993    Years since quitting: 28.9   Smokeless tobacco: Never  Vaping Use   Vaping status: Never Used  Substance and Sexual Activity   Alcohol use: Yes    Alcohol/week: 1.0 standard drink of alcohol    Types: 1 Cans of beer per week   Drug use: No   Sexual activity: Not on file  Other Topics Concern   Not on file  Social History Narrative   Not on file   Social Determinants of Health   Financial Resource Strain: Low Risk  (04/08/2018)   Overall Financial Resource Strain (CARDIA)    Difficulty of  Paying Living Expenses: Not hard at all  Food Insecurity: No Food Insecurity (08/26/2022)   Hunger Vital Sign    Worried About Running Out of Food in the Last Year: Never true    Ran Out of Food in the Last Year: Never true  Transportation Needs: No Transportation Needs (08/26/2022)   PRAPARE - Administrator, Civil Service (Medical): No    Lack of Transportation (Non-Medical): No  Physical Activity: Unknown (04/08/2018)   Exercise Vital Sign    Days of Exercise per Week: 3 days    Minutes of Exercise per Session: Not asked  Stress: Stress Concern Present (04/08/2018)   [employer] of Occupational Health - Occupational Stress Questionnaire    Feeling of Stress : To some extent  Social Connections: Not on file  Intimate Partner Violence: Not At Risk (08/26/2022)   Humiliation, Afraid, Rape, and Kick questionnaire    Fear of Current or Ex-Partner: No    Emotionally Abused: No    Physically Abused: No    Sexually Abused: No     Review of Systems: General: negative for chills, fever, night sweats or weight changes.  Cardiovascular: negative for chest pain, dyspnea on exertion, edema, orthopnea, palpitations, paroxysmal nocturnal dyspnea or shortness of breath Dermatological: negative for rash Respiratory: negative for cough or  wheezing Urologic: negative for hematuria Abdominal: negative for nausea, vomiting, diarrhea, bright red blood per rectum, melena, or hematemesis Neurologic: negative for visual changes, syncope, or dizziness All other systems reviewed and are otherwise negative except as noted above.    Blood pressure (!) 128/58, pulse (!) 46, height 6\' 3"  (1.905 m), weight 212 lb 3.2 oz (96.3 kg), SpO2 96%.  General appearance: alert and no distress Neck: no adenopathy, no JVD, supple, symmetrical, trachea midline, thyroid not enlarged, symmetric, no tenderness/mass/nodules, and soft bilateral carotid bruits Lungs: clear to auscultation bilaterally Heart: Heart rate was irregularly irregular Extremities: extremities normal, atraumatic, no cyanosis or edema Pulses: Diminished pedal pulses Skin: Skin color, texture, turgor normal. No rashes or lesions Neurologic: Grossly normal  EKG     ASSESSMENT AND PLAN:   Essential hypertension History of essential hypertension blood pressure measured today at 128/58.  He is on no antihypertensive medications.  Atrial fibrillation with slow ventricular response (HCC) History of persistent A-fib with heart rates averaging in the 40s on Eliquis oral anticoagulation.  He is asymptomatic from this.  He is not on any negative chronotropic medications.  At this point, there is no indication for permanent transvenous pacemaker insertion.  Carotid stenosis, asymptomatic Moderate right ICA stenosis by duplex ultrasound performed 06/25/2022.  This has remained stable.  Will repeat this in April of next year.  CAD S/P percutaneous coronary angioplasty History of CAD status post RCA stenting by myself 01/11/1998.  He had normal circumflex and LAD at that time.  He was admitted with a non-STEMI 01/21/2018 underwent cardiac catheterization 2 days later by Dr. Okey Dupre revealing left main disease.  Underwent CABG by Dr. Laneta Simmers 01/28/2018 with a LIMA to his LAD, vein to the obtuse  marginal branch, and PDA/PLA sequentially.  Also had left atrial clipping at that time.  He has done well since from a cardiac point of view denying chest pain.  Dyslipidemia, goal LDL below 70 History of on statin therapy with lipid profile performed 12/22/2021 revealing a total cholesterol of 99, LDL 49 and HDL 38.  Edema of leg Lower extremity edema improved with the addition of diuretics.  Pulmonary hypertension, unspecified (HCC)  Pulmonary hypertension on 2D echo most recently performed 08/27/2022 with a RV systolic pressure of 54.  He did undergo a right heart cath by Dr. Gala Romney on in the recent past that did not suggest pulmonary hypertension.  He does not TR bowel.  Peripheral arterial disease (HCC) History of peripheral arterial disease status post peripheral angiography performed by myself 12/21/2021 revealing significant bilateral iliac disease.  I performed PTA and stenting using VBX covered stents.  He did have tandem lesions in both SFAs which have not been addressed although he denies claudication.  His most recent Doppler studies performed 06/25/2022 suggest that his stents have remained patent.  Obstructive sleep apnea History of obstructive sleep apnea on BiPAP which she benefits from.     [name] MD FACP,FACC,FAHA, [facility] 10/16/2022 10:55 AM

## 2022-10-16 NOTE — Assessment & Plan Note (Signed)
History of on statin therapy with lipid profile performed 12/22/2021 revealing a total cholesterol of 99, LDL 49 and HDL 38.

## 2022-10-18 ENCOUNTER — Ambulatory Visit: Payer: Medicare Other | Admitting: Physical Therapy

## 2022-10-26 ENCOUNTER — Ambulatory Visit: Payer: Medicare Other | Admitting: Physical Therapy

## 2022-10-26 ENCOUNTER — Other Ambulatory Visit: Payer: Self-pay | Admitting: Cardiovascular Disease

## 2022-10-26 DIAGNOSIS — I4821 Permanent atrial fibrillation: Secondary | ICD-10-CM

## 2022-10-26 NOTE — Telephone Encounter (Signed)
Prescription refill request for Eliquis received. Indication: a fib Last office visit: 10/16/22 Scr: 0.99 epic 09/11/22 Age: 82 Weight: 96kg

## 2022-10-29 NOTE — Therapy (Signed)
OUTPATIENT PHYSICAL THERAPY TREATMENT   Patient Name: [name] MRN: [id number] DOB:10-09-1940, 82 y.o., male Today's Date: 10/30/2022   END OF SESSION:  PT End of Session - 10/30/22 1106     Visit Number 8    Number of Visits 17    Date for PT Re-Evaluation 11/12/22    Authorization Type UHC MCR    Progress Note Due on Visit 10    PT Start Time 1100    PT Stop Time 1140    PT Time Calculation (min) 40 min    Activity Tolerance Patient tolerated treatment well    Behavior During Therapy [facility] for tasks assessed/performed                   Past Medical History:  Diagnosis Date   A-fib (HCC)    Allergy    Seasonal--pollen   Arrhythmia    CAD (coronary artery disease) 1999   RCA stent   Carotid stenosis, asymptomatic    moderate RT and mild LT with carotid dopplers 10/26/11   Colon cancer (HCC) 04/2006   Stage III--T3 N1   COPD (chronic obstructive pulmonary disease) (HCC)    H/O cardiovascular stress test 01/13/2010   low risk scan, similar to 2008 study   H/O echocardiogram 05/23/2009   EF >55%, mild MR, Mild TR,    Hand foot syndrome    Hemorrhoids    Hyperlipidemia    Hypertension    Neuropathy    Permanent atrial fibrillation (HCC)    Psoriasis    Tubular adenoma of colon    Past Surgical History:  Procedure Laterality Date   ABDOMINAL AORTOGRAM W/LOWER EXTREMITY N/A 12/21/2021   Procedure: ABDOMINAL AORTOGRAM W/LOWER EXTREMITY;  Surgeon: [name], MD;  Location: [facility] INVASIVE CV LAB;  Service: Cardiovascular;  Laterality: N/A;   CARDIAC CATHETERIZATION     CLIPPING OF ATRIAL APPENDAGE  01/28/2018   Procedure: CLIPPING OF ATRIAL APPENDAGE;  Surgeon: Alleen Borne, MD;  Location: [facility] OR;  Service: Open Heart Surgery;;   COLONOSCOPY     CORONARY ARTERY BYPASS GRAFT N/A 01/28/2018   Procedure: CORONARY ARTERY BYPASS GRAFTING (CABG) X 4 USING LEFT INTERNAL MAMMARY ARTERY AND LEFT GREATER SAPHENOUS VEIN HARVESTED ENDOSCOPICALLY;  Surgeon:  Alleen Borne, MD;  Location: [facility] OR;  Service: Open Heart Surgery;  Laterality: N/A;   CORONARY STENT PLACEMENT  1999   RCA tandem NIR stents   LEFT HEART CATH AND CORONARY ANGIOGRAPHY N/A 01/23/2018   Procedure: LEFT HEART CATH AND CORONARY ANGIOGRAPHY;  Surgeon: Yvonne Kendall, MD;  Location: [facility] INVASIVE CV LAB;  Service: Cardiovascular;  Laterality: N/A;   PERIPHERAL VASCULAR ATHERECTOMY Bilateral 12/21/2021   Procedure: PERIPHERAL VASCULAR ATHERECTOMY;  Surgeon: [name], MD;  Location: [facility] CV [facility];  Service: Cardiovascular;  Laterality: Bilateral;  common illiac   PERIPHERAL VASCULAR INTERVENTION Bilateral 12/21/2021   Procedure: PERIPHERAL VASCULAR INTERVENTION;  Surgeon: [name], MD;  Location: [facility] CV [facility];  Service: Cardiovascular;  Laterality: Bilateral;  Common Illiacs   RIGHT COLECTOMY  05/29/2006   Laparoscopic assisted   RIGHT HEART CATH N/A 06/14/2021   Procedure: RIGHT HEART CATH;  Surgeon: Dolores Patty, MD;  Location: [facility] INVAS[facility] CV [facility];  Service: Cardiovascular;  Laterality: N/A;   TEE WITHOUT CARDIOVERSION N/A 01/28/2018   Procedure: TRANSESOPHAGEAL ECHOCARDIOGRAM (TEE);  Surgeon: Alleen Borne, MD;  Location[facility];  Service: Open Heart Surgery;  Laterality: N/A;   TONSILLECTOMY     Patient  Active Problem List   Diagnosis Date Noted   Atherosclerotic heart disease of native coronary artery with other forms of angina pectoris (HCC) 09/12/2022   Chronic diastolic heart failure (HCC) 09/12/2022   Near syncope 08/26/2022   Hyponatremia 08/26/2022   Symptomatic bradycardia 08/26/2022   Claudication in peripheral vascular disease (HCC) 12/21/2021   Obstructive sleep apnea 07/04/2021   Peripheral arterial disease (HCC) 04/25/2021   Pulmonary hypertension, unspecified (HCC) 03/22/2021   Edema of leg 02/26/2018   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   NSTEMI (non-ST elevated myocardial infarction) (HCC) 01/21/2018   History of colon cancer  08/19/2013   Essential hypertension 11/20/2012   Dyslipidemia, goal LDL below 70 11/20/2012   Chronic anticoagulation    Carotid stenosis, asymptomatic    CAD S/P percutaneous coronary angioplasty    Atrial fibrillation with slow ventricular response (HCC) 06/02/2012    PCP: Thana Ates, MD  REFERRING PROVIDER: Osvaldo Shipper, MD  REFERRING DIAG: Muscle weakness, Other abnormalities of gait and mobility, Unsteadiness on feet, Difficulty in walking, not elsewhere classified  THERAPY DIAG:  Muscle weakness (generalized)  Other abnormalities of gait and mobility  Rationale for Evaluation and Treatment: Rehabilitation  ONSET DATE: Chronic, about 2 years    SUBJECTIVE:  SUBJECTIVE STATEMENT: Patient reports he has had to cancel the past few appointments due to right leg illness. The right leg is still bothering him a little bit today. He plans to go back to his trainer tomorrow and do the bike and treadmill.   PAIN:  Are you having pain? No  PERTINENT HISTORY: See cardiac history above  PRECAUTIONS: Fall  WEIGHT BEARING RESTRICTIONS: No  PATIENT GOALS: Get stronger and improve walking   OBJECTIVE:  PATIENT SURVEYS:  FOTO 52% functional status  10/11/2022: 47%  POSTURE:   Rounded shoulder posture  LOWER EXTREMITY MMT:  MMT Right eval Left eval  Hip flexion 4 4  Hip extension 3 3  Hip abduction 3 3  Hip adduction    Hip internal rotation    Hip external rotation    Knee flexion 4+ 4+  Knee extension 4+ 4+  Ankle dorsiflexion    Ankle plantarflexion    Ankle inversion    Ankle eversion     (Blank rows = not tested)  FUNCTIONAL TESTS:  5 times sit to stand: 15 seconds  10/02/2022: 12 seconds Timed up and go (TUG): 12 seconds  10/30/2022: 10 seconds 6 minute walk test: 555 ft- 2 min rest break at 3 min  GAIT: Distance walked: 555 ft Assistive device utilized: None Level of assistance: Complete Independence Comments: Decreased gait speed and step  length, occasional unsteadiness   TODAY'S TREATMENT:     [facility] Adult PT Treatment:                                                DATE: 10/30/2022 Therapeutic Exercise: NuStep L6 x 6 min with UE/LE to improve workload capacity Leg press (cybex) 100# 4 x 8 SLR 2 x 10 each Bridge 2 x 10 Sidelying hip abduction 2 x 10 each  Attempted sit to stand but patient reports right leg discomfort/weakness Knee extension machine 35# 3 x 10   [facility] Adult PT Treatment:  DATE: 10/16/2022 Therapeutic Exercise: NuStep L7 x 6 min with UE/LE to improve workload capacity Leg press (cybex) 100# 4 x 8 Resisted lateral stepping with FM 13# x 4 lengths each Knee extension machine 20# x 10, 2 x 15 Knee flexion machine 35# 3 x 15 Sit to stand from mat table x 10  [facility] Adult PT Treatment:                                                DATE: 10/11/2022 Therapeutic Exercise: NuStep L7 x 6 min with UE/LE while taking subjective Leg press (cybex) 60# x 10, 100# 4 x 6 SLR 2 x 10 each Bridge x 10, x 4 - patient reports hamstring cramping so d/c'd exercise Sidelying hip abduction 2 x 10 each Seated hamstring stretch 2 x 20 sec each Standing heel raises 2 x 15  Standing hip extension 2 x 15 each Therapeutic Activity: FOTO reassessment to assess progress toward goal  [facility] Adult PT Treatment:                                                DATE: 10/08/2022 Therapeutic Exercise: NuStep L6 x 5 min with UE/LE while taking subjective Sit to stand 3 x 10 Forward 8" step-up 2 x 10 each  Resisted lateral stepping with FM 10# x 4 lengths each Standing hip abduction and extension with red at knees 2 x 15 each Tandem stance x 30 sec each - d/c'd due to patient reporting fatigue Knee extension machine 15# 3 x 15 each  [facility] Adult PT Treatment:                                                DATE: 10/04/22 Therapeutic Exercise: Nustep L6 x 5 min with UE/LE  Standing heel raises 20  x 2  Gastroc stretch Standing hip abduction and extension with green  at knees 2 x 10 each Standing hip flexion 10 x 2 each  Knee extension 15 x 2 15# ,  Knee flexion 35# 15 x 2 STS 10 x 3 with rest breaks, cues for controlled descent   PATIENT EDUCATION:  Education details: HEP Person educated: Patient Education method: Explanation, Demonstration, Tactile cues, Verbal cues Education comprehension: verbalized understanding, returned demonstration, verbal cues required, tactile cues required, and needs further education  HOME EXERCISE PROGRAM: Access Code: [email or web address]    ASSESSMENT: CLINICAL IMPRESSION: Patient tolerated therapy well with no adverse effects. Therapy focused on continued LE strengthening as tolerated with patient reporting right lower leg discomfort secondary to previous vascular issues. He was able to complete progressions in machine strengthening exercises but unable to tolerate sit to stand this visit due to right leg discomfort. He does report his balance has improved but he continues to have deficit with walking tolerance. He does exhibit improvement in his TUG this visit but remains limited. No changes made to HEP. Patient would benefit from continued skilled PT to progress his mobility, strength, and balance in order to improve confidence with walking and maximize functional ability.    OBJECTIVE IMPAIRMENTS: Abnormal gait, decreased activity  tolerance, decreased balance, decreased endurance, decreased strength, postural dysfunction, and pain.   ACTIVITY LIMITATIONS: lifting, squatting, and locomotion level  PARTICIPATION LIMITATIONS: shopping and community activity  PERSONAL FACTORS: Fitness, Past/current experiences, and Time since onset of injury/illness/exacerbation are also affecting patient's functional outcome.    GOALS: Goals reviewed with patient? Yes  SHORT TERM GOALS: Target date: 10/15/2022  Patient will be I with initial HEP in order to progress  with therapy. Baseline: HEP provided at eval 10/11/2022: independent Goal status: MET  2.  Patient will perform 5xSTS </= 12 seconds to indicate improved strength and reduced fall risk Baseline: 15 seconds 10/02/2022: 12 seconds Goal status: MET  3.  Patient will perform TUG </= 9 seconds to indicate improved mobility and confidence with walking Baseline: 12 seconds 10/30/2022: 10 seconds Goal status: ONGOING  LONG TERM GOALS: Target date: 11/12/2022  Patient will be I with final HEP to maintain progress from PT. Baseline:  Goal status: INITIAL  2.  Patient will report >/= 58% status on FOTO to indicate improved functional ability. Baseline: 52% 10/11/2022: 47% Goal status: ONGOING  3.  Patient will perform >/= 900 ft in order to improve community ambulation Baseline: 555 ft Goal status: INITIAL  4.  Patient will demonstrate hip strength >/= 4/5 MMT and knee strength 5/5 MMT to improve his walking tolerance and steadiness Baseline: hip strength grossly 3/4 MMT and knee strength grossly 4+/5 MMT Goal status: INITIAL   PLAN: PT FREQUENCY: 1-2x/week  PT DURATION: 8 weeks  PLANNED INTERVENTIONS: Therapeutic exercises, Therapeutic activity, Neuromuscular re-education, Balance training, Gait training, Patient/Family education, Self Care, Joint mobilization, Aquatic Therapy, Manual therapy, and Re-evaluation  PLAN FOR NEXT SESSION: Review HEP and progress PRN, focus on LE and postural strengthening, endurance training, balance training   Rosana Hoes, PT, DPT, LAT, ATC 10/30/22  11:48 AM Phone: [phone] Fax: [phone]

## 2022-10-30 ENCOUNTER — Other Ambulatory Visit: Payer: Self-pay

## 2022-10-30 ENCOUNTER — Encounter: Payer: Self-pay | Admitting: Physical Therapy

## 2022-10-30 ENCOUNTER — Ambulatory Visit: Payer: Medicare Other | Attending: Internal Medicine | Admitting: Physical Therapy

## 2022-10-30 DIAGNOSIS — M6281 Muscle weakness (generalized): Secondary | ICD-10-CM | POA: Insufficient documentation

## 2022-10-30 DIAGNOSIS — R2689 Other abnormalities of gait and mobility: Secondary | ICD-10-CM | POA: Insufficient documentation

## 2022-11-01 ENCOUNTER — Ambulatory Visit: Payer: Medicare Other | Admitting: Physical Therapy

## 2022-11-05 ENCOUNTER — Encounter: Payer: Self-pay | Admitting: Cardiology

## 2022-11-05 ENCOUNTER — Ambulatory Visit: Payer: Medicare Other | Attending: Cardiology | Admitting: Cardiology

## 2022-11-05 VITALS — BP 140/50 | Ht 75.0 in | Wt 210.0 lb

## 2022-11-05 DIAGNOSIS — G4709 Other insomnia: Secondary | ICD-10-CM | POA: Diagnosis not present

## 2022-11-05 DIAGNOSIS — G4733 Obstructive sleep apnea (adult) (pediatric): Secondary | ICD-10-CM | POA: Diagnosis not present

## 2022-11-05 DIAGNOSIS — I10 Essential (primary) hypertension: Secondary | ICD-10-CM

## 2022-11-05 NOTE — Patient Instructions (Signed)
Medication Instructions:  Your physician recommends that you continue on your current medications as directed. Please refer to the Current Medication list given to you today.  *If you need a refill on your cardiac medications before your next appointment, please call your pharmacy*   Lab Work: None.  If you have labs (blood work) drawn today and your tests are completely normal, you will receive your results only by: MyChart Message (if you have MyChart) OR A paper copy in the mail If you have any lab test that is abnormal or we need to change your treatment, we will call you to review the results.   Testing/Procedures: None.   Follow-Up: At  [facility], you and your health needs are our priority.  As part of our continuing mission to provide you with exceptional heart care, we have created designated Provider Care Teams.  These Care Teams include your primary Cardiologist (physician) and Advanced Practice Providers (APPs -  Physician Assistants and Nurse Practitioners) who all work together to provide you with the care you need, when you need it.  We recommend signing up for the patient portal called "MyChart".  Sign up information is provided on this After Visit Summary.  MyChart is used to connect with patients for Virtual Visits (Telemedicine).  Patients are able to view lab/test results, encounter notes, upcoming appointments, etc.  Non-urgent messages can be sent to your provider as well.   To learn more about what you can do with MyChart, go to [email or web address].    Your next appointment:   1 year(s)  Provider:   Dr. Traci Turner, MD   

## 2022-11-05 NOTE — Progress Notes (Signed)
Date:  11/05/2022   ID:  [name], DOB 01/26/1941, MRN [id number] The patient was identified using 2 identifiers.  PCP:  Thana Ates, MD   [facility] Providers Cardiologist:  Nanetta Batty, MD     Evaluation Performed:  New Patient Evaluation  Chief Complaint:  OSA  History of Present Illness:    [name] is a 82 y.o. male  with a hx of HTN, chronic afib, ASCAD, PHTN and colon CA s/p right colectomy and severe OSA with an AHI fo 45/hr and O2 sats as low as 85%. He underwent CPAP titration which was unsuccessful due to ongoing respiratory events.  He was started on auto BiPAP with IPAP max of 20cm H2O, EPAP min 5cm H2O and PS 4cm H2O.    He is doing well with his PAP device and thinks that he has gotten used to it.  He tolerates the full face mask and feels the pressure is adequate.  He has a lot of problems with insomnia and only gets 4-5 hours sleep nightly so he feels tired chronically during the day.  He talked with his PCP about this and was told, due to his advanced age, he is only a candidate for Melatonin which he tried and is not working.  He has significant mouth dryness and nose dryness and has tried adjusting the humidity but it did not work. He only went up to level 3/5.  Past Medical History:  Diagnosis Date   A-fib (HCC)    Allergy    Seasonal--pollen   Arrhythmia    CAD (coronary artery disease) 1999   RCA stent   Carotid stenosis, asymptomatic    moderate RT and mild LT with carotid dopplers 10/26/11   Colon cancer (HCC) 04/2006   Stage III--T3 N1   COPD (chronic obstructive pulmonary disease) (HCC)    H/O cardiovascular stress test 01/13/2010   low risk scan, similar to 2008 study   H/O echocardiogram 05/23/2009   EF >55%, mild MR, Mild TR,    Hand foot syndrome    Hemorrhoids    Hyperlipidemia    Hypertension    Neuropathy    Permanent atrial fibrillation (HCC)    Psoriasis    Tubular adenoma of colon    Past  Surgical History:  Procedure Laterality Date   ABDOMINAL AORTOGRAM W/LOWER EXTREMITY N/A 12/21/2021   Procedure: ABDOMINAL AORTOGRAM W/LOWER EXTREMITY;  Surgeon: [name], MD;  Location: [facility] INVASIVE CV LAB;  Service: Cardiovascular;  Laterality: N/A;   CARDIAC CATHETERIZATION     CLIPPING OF ATRIAL APPENDAGE  01/28/2018   Procedure: CLIPPING OF ATRIAL APPENDAGE;  Surgeon: Alleen Borne, MD;  Location: [facility] OR;  Service: Open Heart Surgery;;   COLONOSCOPY     CORONARY ARTERY BYPASS GRAFT N/A 01/28/2018   Procedure: CORONARY ARTERY BYPASS GRAFTING (CABG) X 4 USING LEFT INTERNAL MAMMARY ARTERY AND LEFT GREATER SAPHENOUS VEIN HARVESTED ENDOSCOPICALLY;  Surgeon: Alleen Borne, MD;  Location: [facility] OR;  Service: Open Heart Surgery;  Laterality: N/A;   CORONARY STENT PLACEMENT  1999   RCA tandem NIR stents   LEFT HEART CATH AND CORONARY ANGIOGRAPHY N/A 01/23/2018   Procedure: LEFT HEART CATH AND CORONARY ANGIOGRAPHY;  Surgeon: Yvonne Kendall, MD;  Location: [facility];  Service: Cardiovascular;  Laterality: N/A;   PERIPHERAL VASCULAR ATHERECTOMY Bilateral 12/21/2021   Procedure: PERIPHERAL VASCULAR ATHERECTOMY;  Surgeon: [name], MD;  Location: [facility] INVASIVE  CV LAB;  Service: Cardiovascular;  Laterality: Bilateral;  common illiac   PERIPHERAL VASCULAR INTERVENTION Bilateral 12/21/2021   Procedure: PERIPHERAL VASCULAR INTERVENTION;  Surgeon: [name], MD;  Location: [facility]VAS[facility] CV [facility];  Service: Cardiovascular;  Laterality: Bilateral;  Common Illiacs   RIGHT COLECTOMY  05/29/2006   Laparoscopic assisted   RIGHT HEART CATH N/A 06/14/2021   Procedure: RIGHT HEART CATH;  Surgeon: Dolores Patty, MD;  Location: [facility] INVAS[facility];  Service: Cardiovascular;  Laterality: N/A;   TEE WITHOUT CARDIOVERSION N/A 01/28/2018   Procedure: TRANSESOPHAGEAL ECHOCARDIOGRAM (TEE);  Surgeon: Alleen Borne, MD;  Location: [facility] OR;  Service: Open Heart Surgery;  Laterality: N/A;    TONSILLECTOMY       Current Meds  Medication Sig   Alpha-Lipoic Acid 600 MG TABS Take 1,200 mg by mouth daily.   atorvastatin (LIPITOR) 80 MG tablet TAKE 1 TABLET AT 6PM. (Patient taking differently: Take 80 mg by mouth daily.)   cholecalciferol (VITAMIN D) 1000 UNITS tablet Take 1,000 Units by mouth daily.   clopidogrel (PLAVIX) 75 MG tablet Take 1 tablet (75 mg total) by mouth daily with breakfast.   Coenzyme Q10 200 MG capsule Take 200 mg by mouth daily.    Cyanocobalamin (VITAMIN B 12 PO) Take 200 mcg by mouth daily.   dapagliflozin propanediol (FARXIGA) 10 MG TABS tablet Take 1 tablet (10 mg total) by mouth daily.   ELIQUIS 5 MG TABS tablet TAKE 1 TABLET BY MOUTH TWICE DAILY.   Flaxseed, Linseed, 1000 MG CAPS Take 1,000 mg by mouth daily.   furosemide (LASIX) 40 MG tablet Take 1 tablet (40 mg total) by mouth 2 (two) times daily.   Magnesium Citrate 200 MG TABS Take 400 mg by mouth daily in the afternoon.   metolazone (ZAROXOLYN) 2.5 MG tablet Take 1 tablet by mouth on Monday, Wednesday, and Friday   Polyethyl Glycol-Propyl Glycol (SYSTANE) 0.4-0.3 % GEL ophthalmic gel Place 1 application. into both eyes every 8 (eight) hours as needed (dry eyes).   potassium chloride SA (KLOR-CON M) 20 MEQ tablet Take 1 tablet (20 mEq total) by mouth daily.   pyridoxine (B-6) 200 MG tablet Take 200 mg by mouth daily.   vitamin C (ASCORBIC ACID) 500 MG tablet Take 500 mg by mouth 3 (three) times daily.      Allergies:   Patient has no known allergies.   Social History   Tobacco Use   Smoking status: Former    Current packs/day: 0.00    Types: Cigarettes    Start date: 11/26/1963    Quit date: 11/25/1993    Years since quitting: 28.9   Smokeless tobacco: Never  Vaping Use   Vaping status: Never Used  Substance Use Topics   Alcohol use: Yes    Alcohol/week: 1.0 standard drink of alcohol    Types: 1 Cans of beer per week   Drug use: No     Family Hx: The patient's family history includes  Healthy in his sister; Heart disease in his mother, paternal grandfather, and paternal grandmother; Heart disease (age of onset: 43) in his father; Hypertension in his mother and paternal grandfather. There is no history of Colon cancer.  ROS:   Please see the history of present illness.     All other systems reviewed and are negative.   Prior Sleep studies:   The following studies were reviewed today:  PAP titration 07/18/2021 IMPRESSIONS - An optimal PAP pressure was selected for this patient (  22/18 cm of water) - Central sleep apnea was not noted during this titration (CAI = 4.6/h). - Mild oxygen desaturations were observed during this titration (min O2 = 87.0%). - The patient snored with soft snoring volume. - No cardiac abnormalities were observed during this study. - Clinically significant periodic limb movements were not noted during this study. Arousals associated with PLMs were rare.   DIAGNOSIS - Obstructive Sleep Apnea (G47.33)   RECOMMENDATIONS - Trial of auto BiPAP therapy with IPAP max 20cm H2O, EPAP min 5cm H2O and PS 4cm H2O with a Medium size Fisher[employer]Paykel Full Face Simplus mask and heated humidification. - Avoid alcohol, sedatives and other CNS depressants that may worsen sleep apnea and disrupt normal sleep architecture. - Sleep hygiene should be reviewed to assess factors that may improve sleep quality. - Weight management and regular exercise should be initiated or continued. - Return to Sleep Center for re-evaluation after 6 weeks of therapy  Home Sleep Study  06/04/2021 FINDINGS: 1. Severe Obstructive Sleep Apnea with AHI 45 /hr. 2. Mild Central Sleep Apnea with 15/hr. 3. Oxygen desaturations as low as 85%. 4. Minimal snoring was present. O2 sats were < 88% for 3.1 min. 5. Total sleep time was 5 hrs and 28 min. 6. 20% of total sleep time was spent in REM sleep. 7. Normal sleep onset latency at 15 min. 8. Shortened REM sleep onset latency at 54 min. 9.  Total awakenings were 4.   DIAGNOSIS: Severe Obstructive Sleep Apnea (G47.33) Central Sleep Apnea  Labs/Other Tests and Data Reviewed:     Recent Labs: 06/29/2022: ALT 54 08/26/2022: Magnesium 2.2; TSH 2.798 09/11/2022: BNP 49.1; BUN 19; Creatinine, Ser 0.99; Hemoglobin 13.9; Platelets 203; Potassium 4.5; Sodium 135    Wt Readings from Last 3 Encounters:  11/05/22 210 lb (95.3 kg)  10/16/22 212 lb 3.2 oz (96.3 kg)  09/11/22 210 lb (95.3 kg)     Risk Assessment/Calculations:          Objective:    Vital Signs:  BP (!) 140/50 (BP Location: Left Arm, Patient Position: Sitting, Cuff Size: Normal)   Ht 6\' 3"  (1.905 m)   Wt 210 lb (95.3 kg)   BMI 26.25 kg/m   GEN: Well nourished, well developed in no acute distress HEENT: Normal NECK: No JVD; No carotid bruits LYMPHATICS: No lymphadenopathy CARDIAC:RRR, no murmurs, rubs, gallops RESPIRATORY:  Clear to auscultation without rales, wheezing or rhonchi  ABDOMEN: Soft, non-tender, non-distended MUSCULOSKELETAL:  No edema; No deformity  SKIN: Warm and dry NEUROLOGIC:  Alert and oriented x 3 PSYCHIATRIC:  Normal affect  ASSESSMENT & PLAN:    OSA - The patient is tolerating PAP therapy well without any problems. The PAP download performed by his DME was personally reviewed and interpreted by me today and showed an AHI of 1.2/hr on auto BIPAP with 100% compliance in using more than 4 hours nightly.  The patient has been using and benefiting from PAP use and will continue to benefit from therapy.  -I encouraged him to try to slowly titrate up his humidity to help with mouth and nose dryness.  INSOMNIA -he has had problems for most of his life with getting to sleep and staying asleep -he averages about 5 hours of sleep nightly -he has tried melatonin but no other sleep aides -Given his advanced age, I have asked him to talk with his PCP about safe alternatives for sleep  HTN -BP is controlled on exam today -He is currently on no  medication for blood pressure control  Medication Adjustments/Labs and Tests Ordered: Current medicines are reviewed at length with the patient today.  Concerns regarding medicines are outlined above.   Tests Ordered: No orders of the defined types were placed in this encounter.   Medication Changes: No orders of the defined types were placed in this encounter.   Follow Up:   in 1 year(s)  Signed, Armanda Magic, MD  11/05/2022 1:43 PM    [facility]

## 2022-11-06 ENCOUNTER — Ambulatory Visit: Payer: Medicare Other | Admitting: Physical Therapy

## 2022-11-08 ENCOUNTER — Ambulatory Visit: Payer: Medicare Other | Admitting: Physical Therapy

## 2022-11-08 ENCOUNTER — Encounter: Payer: Self-pay | Admitting: Physical Therapy

## 2022-11-08 DIAGNOSIS — R2689 Other abnormalities of gait and mobility: Secondary | ICD-10-CM

## 2022-11-08 DIAGNOSIS — M6281 Muscle weakness (generalized): Secondary | ICD-10-CM | POA: Diagnosis not present

## 2022-11-08 NOTE — Therapy (Signed)
OUTPATIENT PHYSICAL THERAPY TREATMENT   Patient Name: [name] MRN: [id number] DOB:06-Mar-1941, 82 y.o., male Today's Date: 11/08/2022   END OF SESSION:  PT End of Session - 11/08/22 1030     Visit Number 9    Number of Visits 17    Date for PT Re-Evaluation 11/12/22    Authorization Type [employer] MCR    Progress Note Due on Visit 10    PT Start Time 1017    PT Stop Time 1100    PT Time Calculation (min) 43 min                   Past Medical History:  Diagnosis Date   A-fib (HCC)    Allergy    Seasonal--pollen   Arrhythmia    CAD (coronary artery disease) 1999   RCA stent   Carotid stenosis, asymptomatic    moderate RT and mild LT with carotid dopplers 10/26/11   Colon cancer (HCC) 04/2006   Stage III--T3 N1   COPD (chronic obstructive pulmonary disease) (HCC)    H/O cardiovascular stress test 01/13/2010   low risk scan, similar to 2008 study   H/O echocardiogram 05/23/2009   EF >55%, mild MR, Mild TR,    Hand foot syndrome    Hemorrhoids    Hyperlipidemia    Hypertension    Neuropathy    OSA treated with BiPAP    severe OSA with an AHI fo 45/hr and O2 sats as low as 85%. On auto BiPAP   Permanent atrial fibrillation (HCC)    Psoriasis    Tubular adenoma of colon    Past Surgical History:  Procedure Laterality Date   ABDOMINAL AORTOGRAM W/LOWER EXTREMITY N/A 12/21/2021   Procedure: ABDOMINAL AORTOGRAM W/LOWER EXTREMITY;  Surgeon: [name], MD;  [facility]: [facility];  Service: Cardiovascular;  Laterality: N/A;   CARDIAC CATHETERIZATION     CLIPPING OF ATRIAL APPENDAGE  01/28/2018   Procedure: CLIPPING OF ATRIAL APPENDAGE;  Surgeon: Alleen Borne, MD;  Location: [facility] OR;  Service: Open Heart Surgery;;   COLONOSCOPY     CORONARY ARTERY BYPASS GRAFT N/A 01/28/2018   Procedure: CORONARY ARTERY BYPASS GRAFTING (CABG) X 4 USING LEFT INTERNAL MAMMARY ARTERY AND LEFT GREATER SAPHENOUS VEIN HARVESTED ENDOSCOPICALLY;  Surgeon: Alleen Borne, MD;  Location: [facility] OR;  Service: Open Heart Surgery;  Laterality: N/A;   CORONARY STENT PLACEMENT  1999   RCA tandem NIR stents   LEFT HEART CATH AND CORONARY ANGIOGRAPHY N/A 01/23/2018   Procedure: LEFT HEART CATH AND CORONARY ANGIOGRAPHY;  Surgeon: Yvonne Kendall, MD;  Location: [facility];  Service: Cardiovascular;  Laterality: N/A;   PERIPHERAL VASCULAR ATHERECTOMY Bilateral 12/21/2021   Procedure: PERIPHERAL VASCULAR ATHERECTOMY;  Surgeon: [name], MD;  Location: [facility];  Service: Cardiovascular;  Laterality: Bilateral;  common illiac   PERIPHERAL VASCULAR INTERVENTION Bilateral 12/21/2021   Procedure: PERIPHERAL VASCULAR INTERVENTION;  Surgeon: [name], MD;  [facility]: [facility];  Service: Cardiovascular;  Laterality: Bilateral;  Common Illiacs   RIGHT COLECTOMY  05/29/2006   Laparoscopic assisted   RIGHT HEART CATH N/A 06/14/2021   Procedure: RIGHT HEART CATH;  Surgeon: Dolores Patty, MD;  [facility]: [facility];  Service: Cardiovascular;  Laterality: N/A;   TEE WITHOUT CARDIOVERSION N/A 01/28/2018   Procedure: TRANSESOPHAGEAL ECHOCARDIOGRAM (TEE);  Surgeon: Alleen Borne, MD;  Location: [facility] OR;  Service: Open Heart Surgery;  Laterality: N/A;  TONSILLECTOMY     Patient Active Problem List   Diagnosis Date Noted   Atherosclerotic heart disease of native coronary artery with other forms of angina pectoris (HCC) 09/12/2022   Chronic diastolic heart failure (HCC) 09/12/2022   Near syncope 08/26/2022   Hyponatremia 08/26/2022   Symptomatic bradycardia 08/26/2022   Claudication in peripheral vascular disease (HCC) 12/21/2021   OSA treated with BiPAP 07/04/2021   Peripheral arterial disease (HCC) 04/25/2021   Pulmonary hypertension, unspecified (HCC) 03/22/2021   Edema of leg 02/26/2018   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   NSTEMI (non-ST elevated myocardial infarction) (HCC) 01/21/2018   History of colon cancer 08/19/2013   Essential  hypertension 11/20/2012   Dyslipidemia, goal LDL below 70 11/20/2012   Chronic anticoagulation    Carotid stenosis, asymptomatic    CAD S/P percutaneous coronary angioplasty    Atrial fibrillation with slow ventricular response (HCC) 06/02/2012    PCP: Thana Ates, MD  REFERRING PROVIDER: Osvaldo Shipper, MD  REFERRING DIAG: Muscle weakness, Other abnormalities of gait and mobility, Unsteadiness on feet, Difficulty in walking, not elsewhere classified  THERAPY DIAG:  Muscle weakness (generalized)  Other abnormalities of gait and mobility  Rationale for Evaluation and Treatment: Rehabilitation  ONSET DATE: Chronic, about 2 years    SUBJECTIVE:  SUBJECTIVE STATEMENT: Pt reports he feels a little better but struggles with constant right leg pain.    PAIN:  Are you having pain? No  PERTINENT HISTORY: See cardiac history above  PRECAUTIONS: Fall  WEIGHT BEARING RESTRICTIONS: No  PATIENT GOALS: Get stronger and improve walking   OBJECTIVE:  PATIENT SURVEYS:  FOTO 52% functional status  10/11/2022: 47%  POSTURE:   Rounded shoulder posture  LOWER EXTREMITY MMT:  MMT Right eval Left eval  Hip flexion 4 4  Hip extension 3 3  Hip abduction 3 3  Hip adduction    Hip internal rotation    Hip external rotation    Knee flexion 4+ 4+  Knee extension 4+ 4+  Ankle dorsiflexion    Ankle plantarflexion    Ankle inversion    Ankle eversion     (Blank rows = not tested)  FUNCTIONAL TESTS:  5 times sit to stand: 15 seconds  10/02/2022: 12 seconds Timed up and go (TUG): 12 seconds  10/30/2022: 10 seconds 6 minute walk test: 555 ft- 2 min rest break at 3 min 11/08/22: 641 feet -1 standing rest break  GAIT: Distance walked: 555 ft Assistive device utilized: None Level of assistance: Complete Independence Comments: Decreased gait speed and step length, occasional unsteadiness   TODAY'S TREATMENT:     [facility] Adult PT Treatment:                                                 DATE: 11/08/22 Therapeutic Exercise: Nustep L6 UE/LE x 7 min Knee ext 35# 15 x 2 Figure stretches bilat SLR to fatigue Hip abduction to fatigue  STS x 6- fatigues  Therapeutic Activity: 6 MWT 641 feet standing and seated rest break    [facility] Adult PT Treatment:                                                DATE: 10/30/2022  Therapeutic Exercise: NuStep L6 x 6 min with UE/LE to improve workload capacity Leg press (cybex) 100# 4 x 8 SLR 2 x 10 each Bridge 2 x 10 Sidelying hip abduction 2 x 10 each  Attempted sit to stand but patient reports right leg discomfort/weakness Knee extension machine 35# 3 x 10   [facility] Adult PT Treatment:                                                DATE: 10/16/2022 Therapeutic Exercise: NuStep L7 x 6 min with UE/LE to improve workload capacity Leg press (cybex) 100# 4 x 8 Resisted lateral stepping with FM 13# x 4 lengths each Knee extension machine 20# x 10, 2 x 15 Knee flexion machine 35# 3 x 15 Sit to stand from mat table x 10  [facility] Adult PT Treatment:                                                DATE: 10/11/2022 Therapeutic Exercise: NuStep L7 x 6 min with UE/LE while taking subjective Leg press (cybex) 60# x 10, 100# 4 x 6 SLR 2 x 10 each Bridge x 10, x 4 - patient reports hamstring cramping so d/c'd exercise Sidelying hip abduction 2 x 10 each Seated hamstring stretch 2 x 20 sec each Standing heel raises 2 x 15  Standing hip extension 2 x 15 each Therapeutic Activity: FOTO reassessment to assess progress toward goal  [facility] Adult PT Treatment:                                                DATE: 10/08/2022 Therapeutic Exercise: NuStep L6 x 5 min with UE/LE while taking subjective Sit to stand 3 x 10 Forward 8" step-up 2 x 10 each  Resisted lateral stepping with FM 10# x 4 lengths each Standing hip abduction and extension with red at knees 2 x 15 each Tandem stance x 30 sec each - d/c'd due to patient reporting fatigue Knee  extension machine 15# 3 x 15 each    PATIENT EDUCATION:  Education details: HEP Person educated: Patient Education method: Programmer, multimedia, Demonstration, Tactile cues, Verbal cues Education comprehension: verbalized understanding, returned demonstration, verbal cues required, tactile cues required, and needs further education  HOME EXERCISE PROGRAM: Access Code: [email or web address]    ASSESSMENT: CLINICAL IMPRESSION: Patient tolerated therapy well with no adverse effects. Therapy focused on continued LE strengthening as tolerated with patient reporting right lower leg discomfort during 6 MWT secondary to previous vascular issues. His 6 MWT distance was improved from 555 ft to 641 feet. He required 1 standing rest break and then did not finish the last minute due to leg pain. He denied fatigue or SOB. He demonstrates improved hip abduction strength, able to complete 15 reps without rest bilateral. He was able to participate in STS today and completed 6 reps with cues for nose over toes to assist in rising without UE. Pt will need reassessment next visit. Patient would benefit from continued skilled PT to progress his mobility, strength, and balance in order to improve confidence with walking  and maximize functional ability.    OBJECTIVE IMPAIRMENTS: Abnormal gait, decreased activity tolerance, decreased balance, decreased endurance, decreased strength, postural dysfunction, and pain.   ACTIVITY LIMITATIONS: lifting, squatting, and locomotion level  PARTICIPATION LIMITATIONS: shopping and community activity  PERSONAL FACTORS: Fitness, Past/current experiences, and Time since onset of injury/illness/exacerbation are also affecting patient's functional outcome.    GOALS: Goals reviewed with patient? Yes  SHORT TERM GOALS: Target date: 10/15/2022  Patient will be I with initial HEP in order to progress with therapy. Baseline: HEP provided at eval 10/11/2022: independent Goal status: MET  2.   Patient will perform 5xSTS </= 12 seconds to indicate improved strength and reduced fall risk Baseline: 15 seconds 10/02/2022: 12 seconds Goal status: MET  3.  Patient will perform TUG </= 9 seconds to indicate improved mobility and confidence with walking Baseline: 12 seconds 10/30/2022: 10 seconds Goal status: ONGOING  LONG TERM GOALS: Target date: 11/12/2022  Patient will be I with final HEP to maintain progress from PT. Baseline:  Goal status: INITIAL  2.  Patient will report >/= 58% status on FOTO to indicate improved functional ability. Baseline: 52% 10/11/2022: 47% Goal status: ONGOING  3.  Patient will perform >/= 900 ft in order to improve community ambulation Baseline: 555 ft 11/08/22: 641 feet Goal status: ONGOING  4.  Patient will demonstrate hip strength >/= 4/5 MMT and knee strength 5/5 MMT to improve his walking tolerance and steadiness Baseline: hip strength grossly 3/4 MMT and knee strength grossly 4+/5 MMT Goal status: INITIAL   PLAN: PT FREQUENCY: 1-2x/week  PT DURATION: 8 weeks  PLANNED INTERVENTIONS: Therapeutic exercises, Therapeutic activity, Neuromuscular re-education, Balance training, Gait training, Patient/Family education, Self Care, Joint mobilization, Aquatic Therapy, Manual therapy, and Re-evaluation  PLAN FOR NEXT SESSION: Review HEP and progress PRN, focus on LE and postural strengthening, endurance training, balance training   Jannette Spanner, PTA 11/08/22 11:53 AM Phone: [phone] Fax: [phone]

## 2022-11-13 ENCOUNTER — Encounter: Payer: Self-pay | Admitting: Physical Therapy

## 2022-11-13 ENCOUNTER — Other Ambulatory Visit: Payer: Self-pay

## 2022-11-13 ENCOUNTER — Ambulatory Visit: Payer: Medicare Other | Admitting: Physical Therapy

## 2022-11-13 DIAGNOSIS — M6281 Muscle weakness (generalized): Secondary | ICD-10-CM

## 2022-11-13 DIAGNOSIS — R2689 Other abnormalities of gait and mobility: Secondary | ICD-10-CM

## 2022-11-13 NOTE — Therapy (Signed)
OUTPATIENT PHYSICAL THERAPY TREATMENT  Progress Note Reporting Period 09/17/1022 to 11/13/2022  See note below for Objective Data and Assessment of Progress/Goals.     Patient Name: [name] MRN: [id number] DOB:[birth year], 82 y.o., male Today's Date: 11/13/2022   END OF SESSION:  PT End of Session - 11/13/22 0941     Visit Number 10    Number of Visits 18    Date for PT Re-Evaluation 01/08/23    Authorization Type [employer] MCR    Progress Note Due on Visit 20    PT Start Time 0933    PT Stop Time 1013    PT Time Calculation (min) 40 min    Activity Tolerance Patient tolerated treatment well    Behavior During Therapy [facility] for tasks assessed/performed                    Past Medical History:  Diagnosis Date   A-fib (HCC)    Allergy    Seasonal--pollen   Arrhythmia    CAD (coronary artery disease) 1999   RCA stent   Carotid stenosis, asymptomatic    moderate RT and mild LT with carotid dopplers 10/26/11   Colon cancer (HCC) 04/2006   Stage III--T3 N1   COPD (chronic obstructive pulmonary disease) (HCC)    H/O cardiovascular stress test 01/13/2010   low risk scan, similar to 2008 study   H/O echocardiogram 05/23/2009   EF >55%, mild MR, Mild TR,    Hand foot syndrome    Hemorrhoids    Hyperlipidemia    Hypertension    Neuropathy    OSA treated with BiPAP    severe OSA with an AHI fo 45/hr and O2 sats as low as 85%. On auto BiPAP   Permanent atrial fibrillation (HCC)    Psoriasis    Tubular adenoma of colon    Past Surgical History:  Procedure Laterality Date   ABDOMINAL AORTOGRAM W/LOWER EXTREMITY N/A 12/21/2021   Procedure: ABDOMINAL AORTOGRAM W/LOWER EXTREMITY;  Surgeon: [name], MD;  Location: [facility] INVASIVE CV LAB;  Service: Cardiovascular;  Laterality: N/A;   CARDIAC CATHETERIZATION     CLIPPING OF ATRIAL APPENDAGE  01/28/2018   Procedure: CLIPPING OF ATRIAL APPENDAGE;  Surgeon: Alleen Borne, MD;  Location: [facility] OR;  Service: Open  Heart Surgery;;   COLONOSCOPY     CORONARY ARTERY BYPASS GRAFT N/A 01/28/2018   Procedure: CORONARY ARTERY BYPASS GRAFTING (CABG) X 4 USING LEFT INTERNAL MAMMARY ARTERY AND LEFT GREATER SAPHENOUS VEIN HARVESTED ENDOSCOPICALLY;  Surgeon: Alleen Borne, MD;  Location: [facility] OR;  Service: Open Heart Surgery;  Laterality: N/A;   CORONARY STENT PLACEMENT  1999   RCA tandem NIR stents   LEFT HEART CATH AND CORONARY ANGIOGRAPHY N/A 01/23/2018   Procedure: LEFT HEART CATH AND CORONARY ANGIOGRAPHY;  Surgeon: Yvonne Kendall, MD;  Location: [facility];  Service: Cardiovascular;  Laterality: N/A;   PERIPHERAL VASCULAR ATHERECTOMY Bilateral 12/21/2021   Procedure: PERIPHERAL VASCULAR ATHERECTOMY;  Surgeon: [name], MD;  Location: [facility];  Service: Cardiovascular;  Laterality: Bilateral;  common illiac   PERIPHERAL VASCULAR INTERVENTION Bilateral 12/21/2021   Procedure: PERIPHERAL VASCULAR INTERVENTION;  Surgeon: [name], MD;  Location: [facility];  Service: Cardiovascular;  Laterality: Bilateral;  Common Illiacs   RIGHT COLECTOMY  05/29/2006   Laparoscopic assisted   RIGHT HEART CATH N/A 06/14/2021   Procedure: RIGHT HEART CATH;  Surgeon: Dolores Patty, MD;  Location: [facility]  INVASIVE CV LAB;  Service: Cardiovascular;  Laterality: N/A;   TEE WITHOUT CARDIOVERSION N/A 01/28/2018   Procedure: TRANSESOPHAGEAL ECHOCARDIOGRAM (TEE);  Surgeon: Alleen Borne, MD;  Location: [facility] OR;  Service: Open Heart Surgery;  Laterality: N/A;   TONSILLECTOMY     Patient Active Problem List   Diagnosis Date Noted   Atherosclerotic heart disease of native coronary artery with other forms of angina pectoris (HCC) 09/12/2022   Chronic diastolic heart failure (HCC) 09/12/2022   Near syncope 08/26/2022   Hyponatremia 08/26/2022   Symptomatic bradycardia 08/26/2022   Claudication in peripheral vascular disease (HCC) 12/21/2021   OSA treated with BiPAP 07/04/2021   Peripheral  arterial disease (HCC) 04/25/2021   Pulmonary hypertension, unspecified (HCC) 03/22/2021   Edema of leg 02/26/2018   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   NSTEMI (non-ST elevated myocardial infarction) (HCC) 01/21/2018   History of colon cancer 08/19/2013   Essential hypertension 11/20/2012   Dyslipidemia, goal LDL below 70 11/20/2012   Chronic anticoagulation    Carotid stenosis, asymptomatic    CAD S/P percutaneous coronary angioplasty    Atrial fibrillation with slow ventricular response (HCC) 06/02/2012    PCP: Thana Ates, MD  REFERRING PROVIDER: Osvaldo Shipper, MD  REFERRING DIAG: Muscle weakness, Other abnormalities of gait and mobility, Unsteadiness on feet, Difficulty in walking, not elsewhere classified  THERAPY DIAG:  Muscle weakness (generalized)  Other abnormalities of gait and mobility  Rationale for Evaluation and Treatment: Rehabilitation  ONSET DATE: Chronic, about 2 years    SUBJECTIVE:  SUBJECTIVE STATEMENT: Pt reports he continues to have to right lower leg pains with activity due to vascular issues. Overall he does feel better and stronger.    PAIN:  Are you having pain? No  PERTINENT HISTORY: See cardiac history above  PRECAUTIONS: Fall  WEIGHT BEARING RESTRICTIONS: No  PATIENT GOALS: Get stronger and improve walking   OBJECTIVE:  PATIENT SURVEYS:  FOTO 52% functional status  10/11/2022: 47%  11/13/2022: 45%  POSTURE:   Rounded shoulder posture  LOWER EXTREMITY MMT:  MMT Right eval Left eval Rt / Lt 11/13/2022  Hip flexion 4 4   Hip extension 3 3 4- / 4-  Hip abduction 3 3 4- / 4-  Hip adduction     Hip internal rotation     Hip external rotation     Knee flexion 4+ 4+ 5 / 5  Knee extension 4+ 4+ 4+ / 5  Ankle dorsiflexion     Ankle plantarflexion     Ankle inversion     Ankle eversion      (Blank rows = not tested)  FUNCTIONAL TESTS:  5 times sit to stand: 15 seconds  10/02/2022: 12 seconds Timed up and go (TUG): 12  seconds  10/30/2022: 10 seconds  11/13/2022: 9 seconds 6 minute walk test: 555 ft- 2 min rest break at 3 min 11/08/22: 641 feet -1 standing rest break  GAIT: Distance walked: 555 ft Assistive device utilized: None Level of assistance: Complete Independence Comments: Decreased gait speed and step length, occasional unsteadiness   TODAY'S TREATMENT:     [facility] Adult PT Treatment:                                                DATE: 11/13/2022 Therapeutic Exercise: NuStep L6 x 6 min with UE/LE to improve workload capacity Leg  press (cybex) 100# x 8, 3 x 10 Forward 6" step-up 2 x 10 each Standing hip abduction and extension with yellow at knees 2 x 15 each Sit to stand 2 x 10   [facility] Adult PT Treatment:                                                DATE: 11/08/22 Therapeutic Exercise: Nustep L6 x 7 min with UE/LE to improve workload capacity Knee ext 35# 15 x 2 Figure stretches bilat SLR to fatigue Hip abduction to fatigue  STS x 6- fatigues Therapeutic Activity: 6 MWT 641 feet standing and seated rest break  [facility] Adult PT Treatment:                                                DATE: 10/30/2022 Therapeutic Exercise: NuStep L6 x 6 min with UE/LE to improve workload capacity Leg press (cybex) 100# 4 x 8 SLR 2 x 10 each Bridge 2 x 10 Sidelying hip abduction 2 x 10 each  Attempted sit to stand but patient reports right leg discomfort/weakness Knee extension machine 35# 3 x 10  [facility] Adult PT Treatment:                                                DATE: 10/16/2022 Therapeutic Exercise: NuStep L7 x 6 min with UE/LE to improve workload capacity Leg press (cybex) 100# 4 x 8 Resisted lateral stepping with FM 13# x 4 lengths each Knee extension machine 20# x 10, 2 x 15 Knee flexion machine 35# 3 x 15 Sit to stand from mat table x 10  [facility] Adult PT Treatment:                                                DATE: 10/11/2022 Therapeutic Exercise: NuStep L7 x 6 min with UE/LE while  taking subjective Leg press (cybex) 60# x 10, 100# 4 x 6 SLR 2 x 10 each Bridge x 10, x 4 - patient reports hamstring cramping so d/c'd exercise Sidelying hip abduction 2 x 10 each Seated hamstring stretch 2 x 20 sec each Standing heel raises 2 x 15  Standing hip extension 2 x 15 each Therapeutic Activity: FOTO reassessment to assess progress toward goal  [facility] Adult PT Treatment:                                                DATE: 10/08/2022 Therapeutic Exercise: NuStep L6 x 5 min with UE/LE while taking subjective Sit to stand 3 x 10 Forward 8" step-up 2 x 10 each  Resisted lateral stepping with FM 10# x 4 lengths each Standing hip abduction and extension with red at knees 2 x 15 each Tandem stance x 30 sec each - d/c'd due to patient reporting fatigue Knee  extension machine 15# 3 x 15 each  PATIENT EDUCATION:  Education details: POC update, HEP Person educated: Patient Education method: Explanation, Demonstration, Tactile cues, Verbal cues Education comprehension: verbalized understanding, returned demonstration, verbal cues required, tactile cues required, and needs further education  HOME EXERCISE PROGRAM: Access Code: [email or web address]    ASSESSMENT: CLINICAL IMPRESSION: Patient tolerated therapy well with no adverse effects. Therapy continues to focus on progressing LE strengthening and stability. He does continue to report limitation related to right lower leg pain from vascular issues. He does exhibit improved strength and has progressed well with all other function measures as noted above. He does continue to exhibit limitations in his walking ability and reports decrease function on FOTO, that he reports is due to lower leg pain that limits him. Overall he has made progress in therapy and patient would benefit from continued skilled PT to progress his mobility, strength, and balance in order to improve confidence with walking and maximize functional ability, so will extend his PT  POC for 8 more weeks.    OBJECTIVE IMPAIRMENTS: Abnormal gait, decreased activity tolerance, decreased balance, decreased endurance, decreased strength, postural dysfunction, and pain.   ACTIVITY LIMITATIONS: lifting, squatting, and locomotion level  PARTICIPATION LIMITATIONS: shopping and community activity  PERSONAL FACTORS: Fitness, Past/current experiences, and Time since onset of injury/illness/exacerbation are also affecting patient's functional outcome.    GOALS: Goals reviewed with patient? Yes  SHORT TERM GOALS: Target date: 10/15/2022  Patient will be I with initial HEP in order to progress with therapy. Baseline: HEP provided at eval 10/11/2022: independent Goal status: MET  2.  Patient will perform 5xSTS </= 12 seconds to indicate improved strength and reduced fall risk Baseline: 15 seconds 10/02/2022: 12 seconds Goal status: MET  3.  Patient will perform TUG </= 9 seconds to indicate improved mobility and confidence with walking Baseline: 12 seconds 10/30/2022: 10 seconds 11/13/2022: 9 seconds Goal status: MET  LONG TERM GOALS: Target date: 01/08/2023  Patient will be I with final HEP to maintain progress from PT. Baseline:  11/13/2022: progressing  Goal status: ONGOING  2.  Patient will report >/= 58% status on FOTO to indicate improved functional ability. Baseline: 52% 10/11/2022: 47% 11/13/2022: 45% Goal status: ONGOING  3.  Patient will perform >/= 900 ft in order to improve community ambulation Baseline: 555 ft 11/08/22: 641 feet Goal status: ONGOING  4.  Patient will demonstrate hip strength >/= 4/5 MMT and knee strength 5/5 MMT to improve his walking tolerance and steadiness Baseline: hip strength grossly 3/4 MMT and knee strength grossly 4+/5 MMT 11/13/2022: hip strength grossly 4-/5 MMT, right knee strength 4+/5 MMT Goal status: ONGOING   PLAN: PT FREQUENCY: 1-2x/week  PT DURATION: 8 weeks  PLANNED INTERVENTIONS: Therapeutic exercises,  Therapeutic activity, Neuromuscular re-education, Balance training, Gait training, Patient/Family education, Self Care, Joint mobilization, Aquatic Therapy, Manual therapy, and Re-evaluation  PLAN FOR NEXT SESSION: Review HEP and progress PRN, focus on LE and postural strengthening, endurance training, balance training   Rosana Hoes, PT, DPT, LAT, ATC 11/13/22  11:51 AM Phone: [phone] Fax: [phone]

## 2022-11-22 ENCOUNTER — Other Ambulatory Visit: Payer: Self-pay

## 2022-11-22 ENCOUNTER — Encounter: Payer: Self-pay | Admitting: Physical Therapy

## 2022-11-22 ENCOUNTER — Ambulatory Visit: Payer: Medicare Other | Attending: Internal Medicine | Admitting: Physical Therapy

## 2022-11-22 DIAGNOSIS — M6281 Muscle weakness (generalized): Secondary | ICD-10-CM | POA: Insufficient documentation

## 2022-11-22 DIAGNOSIS — R2689 Other abnormalities of gait and mobility: Secondary | ICD-10-CM | POA: Diagnosis present

## 2022-11-22 NOTE — Therapy (Addendum)
OUTPATIENT PHYSICAL THERAPY TREATMENT    Patient Name: [name] MRN: [id number] DOB:02-23-1941, 82 y.o., male Today's Date: 11/22/2022   END OF SESSION:  PT End of Session - 11/22/22 1105     Visit Number 11    Number of Visits 18    Date for PT Re-Evaluation 01/08/23    Authorization Type [employer] MCR    Progress Note Due on Visit 20    PT Start Time 1100    PT Stop Time 1138    PT Time Calculation (min) 38 min    Activity Tolerance Patient tolerated treatment well    Behavior During Therapy WFL for tasks assessed/performed                     Past Medical History:  Diagnosis Date   A-fib (HCC)    Allergy    Seasonal--pollen   Arrhythmia    CAD (coronary artery disease) 1999   RCA stent   Carotid stenosis, asymptomatic    moderate RT and mild LT with carotid dopplers 10/26/11   Colon cancer (HCC) 04/2006   Stage III--T3 N1   COPD (chronic obstructive pulmonary disease) (HCC)    H/O cardiovascular stress test 01/13/2010   low risk scan, similar to 2008 study   H/O echocardiogram 05/23/2009   EF >55%, mild MR, Mild TR,    Hand foot syndrome    Hemorrhoids    Hyperlipidemia    Hypertension    Neuropathy    OSA treated with BiPAP    severe OSA with an AHI fo 45/hr and O2 sats as low as 85%. On auto BiPAP   Permanent atrial fibrillation (HCC)    Psoriasis    Tubular adenoma of colon    Past Surgical History:  Procedure Laterality Date   ABDOMINAL AORTOGRAM W/LOWER EXTREMITY N/A 12/21/2021   Procedure: ABDOMINAL AORTOGRAM W/LOWER EXTREMITY;  Surgeon: [name], MD;  Location: [facility];  Service: Cardiovascular;  Laterality: N/A;   CARDIAC CATHETERIZATION     CLIPPING OF ATRIAL APPENDAGE  01/28/2018   Procedure: CLIPPING OF ATRIAL APPENDAGE;  Surgeon: Alleen Borne, MD;  Location: [facility];  Service: Open Heart Surgery;;   COLONOSCOPY     CORONARY ARTERY BYPASS GRAFT N/A 01/28/2018   Procedure: CORONARY ARTERY BYPASS GRAFTING (CABG)  X 4 USING LEFT INTERNAL MAMMARY ARTERY AND LEFT GREATER SAPHENOUS VEIN HARVESTED ENDOSCOPICALLY;  Surgeon: Alleen Borne, MD;  Location: [facility];  Service: Open Heart Surgery;  Laterality: N/A;   CORONARY STENT PLACEMENT  1999   RCA tandem NIR stents   LEFT HEART CATH AND CORONARY ANGIOGRAPHY N/A 01/23/2018   Procedure: LEFT HEART CATH AND CORONARY ANGIOGRAPHY;  Surgeon: Yvonne Kendall, MD;  [facility]: [facility];  Service: Cardiovascular;  Laterality: N/A;   PERIPHERAL VASCULAR ATHERECTOMY Bilateral 12/21/2021   Procedure: PERIPHERAL VASCULAR ATHERECTOMY;  Surgeon: [name], MD;  [facility]: [facility];  Service: Cardiovascular;  Laterality: Bilateral;  common illiac   PERIPHERAL VASCULAR INTERVENTION Bilateral 12/21/2021   Procedure: PERIPHERAL VASCULAR INTERVENTION;  Surgeon: [name], MD;  [facility]: [facility];  Service: Cardiovascular;  Laterality: Bilateral;  Common Illiacs   RIGHT COLECTOMY  05/29/2006   Laparoscopic assisted   RIGHT HEART CATH N/A 06/14/2021   Procedure: RIGHT HEART CATH;  Surgeon: Dolores Patty, MD;  [facility]: [facility];  Service: Cardiovascular;  Laterality: N/A;   TEE WITHOUT CARDIOVERSION N/A 01/28/2018   Procedure:  TRANSESOPHAGEAL ECHOCARDIOGRAM (TEE);  Surgeon: Alleen Borne, MD;  Location: [facility] OR;  Service: Open Heart Surgery;  Laterality: N/A;   TONSILLECTOMY     Patient Active Problem List   Diagnosis Date Noted   Atherosclerotic heart disease of native coronary artery with other forms of angina pectoris (HCC) 09/12/2022   Chronic diastolic heart failure (HCC) 09/12/2022   Near syncope 08/26/2022   Hyponatremia 08/26/2022   Symptomatic bradycardia 08/26/2022   Claudication in peripheral vascular disease (HCC) 12/21/2021   OSA treated with BiPAP 07/04/2021   Peripheral arterial disease (HCC) 04/25/2021   Pulmonary hypertension, unspecified (HCC) 03/22/2021   Edema of leg 02/26/2018   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   NSTEMI (non-ST elevated myocardial infarction) (HCC) 01/21/2018   History of colon cancer 08/19/2013   Essential hypertension 11/20/2012   Dyslipidemia, goal LDL below 70 11/20/2012   Chronic anticoagulation    Carotid stenosis, asymptomatic    CAD S/P percutaneous coronary angioplasty    Atrial fibrillation with slow ventricular response (HCC) 06/02/2012    PCP: Thana Ates, MD  REFERRING PROVIDER: Osvaldo Shipper, MD  REFERRING DIAG: Muscle weakness, Other abnormalities of gait and mobility, Unsteadiness on feet, Difficulty in walking, not elsewhere classified  THERAPY DIAG:  Muscle weakness (generalized)  Other abnormalities of gait and mobility  Rationale for Evaluation and Treatment: Rehabilitation  ONSET DATE: Chronic, about 2 years    SUBJECTIVE:  SUBJECTIVE STATEMENT: Pt reports he has been able to get out and do a little work in his yard. He reports that his left hip feels stiff and he has trouble stretching the left hip.  PAIN:  Are you having pain? No  PERTINENT HISTORY: See cardiac history above  PRECAUTIONS: Fall  WEIGHT BEARING RESTRICTIONS: No  PATIENT GOALS: Get stronger and improve walking   OBJECTIVE:  PATIENT SURVEYS:  FOTO 52% functional status  10/11/2022: 47%  11/13/2022: 45%  POSTURE:   Rounded shoulder posture  LOWER EXTREMITY MMT:  MMT Right eval Left eval Rt / Lt 11/13/2022  Hip flexion 4 4   Hip extension 3 3 4- / 4-  Hip abduction 3 3 4- / 4-  Hip adduction     Hip internal rotation     Hip external rotation     Knee flexion 4+ 4+ 5 / 5  Knee extension 4+ 4+ 4+ / 5  Ankle dorsiflexion     Ankle plantarflexion     Ankle inversion     Ankle eversion      (Blank rows = not tested)  FUNCTIONAL TESTS:  5 times sit to stand: 15 seconds  10/02/2022: 12 seconds Timed up and go (TUG): 12 seconds  10/30/2022: 10 seconds  11/13/2022: 9 seconds 6 minute walk test: 555 ft- 2 min rest break at 3 min 11/08/22: 641  feet -1 standing rest break  GAIT: Distance walked: 555 ft Assistive device utilized: None Level of assistance: Complete Independence Comments: Decreased gait speed and step length, occasional unsteadiness   TODAY'S TREATMENT:     [facility] Adult PT Treatment:                                                DATE: 11/22/2022 Therapeutic Exercise: NuStep L6 x 6 min with UE/LE to improve workload capacity Supine piriformis push / pull stretch 3 x 20 sec each Sit to  stand x 10, 2 x 5 Knee extension machine 25# 3 x 10 Side stepping at counter with red at knees x 6 lengths down/back Forward 8" step-up 2 x 10 each Standing heel raises x 20   [facility] Adult PT Treatment:                                                DATE: 11/13/2022 Therapeutic Exercise: NuStep L6 x 6 min with UE/LE to improve workload capacity Leg press (cybex) 100# x 8, 3 x 10 Forward 6" step-up 2 x 10 each Standing hip abduction and extension with yellow at knees 2 x 15 each Sit to stand 2 x 10  [facility] Adult PT Treatment:                                                DATE: 11/08/22 Therapeutic Exercise: Nustep L6 x 7 min with UE/LE to improve workload capacity Knee ext 35# 15 x 2 Figure stretches bilat SLR to fatigue Hip abduction to fatigue  STS x 6- fatigues Therapeutic Activity: 6 MWT 641 feet standing and seated rest break  [facility] Adult PT Treatment:                                                DATE: 10/30/2022 Therapeutic Exercise: NuStep L6 x 6 min with UE/LE to improve workload capacity Leg press (cybex) 100# 4 x 8 SLR 2 x 10 each Bridge 2 x 10 Sidelying hip abduction 2 x 10 each  Attempted sit to stand but patient reports right leg discomfort/weakness Knee extension machine 35# 3 x 10  [facility] Adult PT Treatment:                                                DATE: 10/16/2022 Therapeutic Exercise: NuStep L7 x 6 min with UE/LE to improve workload capacity Leg press (cybex) 100# 4 x 8 Resisted lateral stepping  with FM 13# x 4 lengths each Knee extension machine 20# x 10, 2 x 15 Knee flexion machine 35# 3 x 15 Sit to stand from mat table x 10  PATIENT EDUCATION:  Education details: HEP Person educated: Patient Education method: Programmer, multimedia, Demonstration, Actor cues, Verbal cues Education comprehension: verbalized understanding, returned demonstration, verbal cues required, tactile cues required, and needs further education  HOME EXERCISE PROGRAM: Access Code: [email or web address]    ASSESSMENT: CLINICAL IMPRESSION: Patient tolerated therapy well with no adverse effects. He did report increased leg soreness and fatigue this visit due to an increase in activity level over the past few days with his yard work. Therapy continues to focus on progressing his LE strength to tolerance. He did report increased leg fatigue with exercises that limited the exercises. Updated his HEP this visit to include some hip stretching for home. Patient would benefit from continued skilled PT to progress his mobility, strength, and balance in order to improve confidence with walking and maximize functional ability.   OBJECTIVE  IMPAIRMENTS: Abnormal gait, decreased activity tolerance, decreased balance, decreased endurance, decreased strength, postural dysfunction, and pain.   ACTIVITY LIMITATIONS: lifting, squatting, and locomotion level  PARTICIPATION LIMITATIONS: shopping and community activity  PERSONAL FACTORS: Fitness, Past/current experiences, and Time since onset of injury/illness/exacerbation are also affecting patient's functional outcome.    GOALS: Goals reviewed with patient? Yes  SHORT TERM GOALS: Target date: 10/15/2022  Patient will be I with initial HEP in order to progress with therapy. Baseline: HEP provided at eval 10/11/2022: independent Goal status: MET  2.  Patient will perform 5xSTS </= 12 seconds to indicate improved strength and reduced fall risk Baseline: 15 seconds 10/02/2022: 12  seconds Goal status: MET  3.  Patient will perform TUG </= 9 seconds to indicate improved mobility and confidence with walking Baseline: 12 seconds 10/30/2022: 10 seconds 11/13/2022: 9 seconds Goal status: MET  LONG TERM GOALS: Target date: 01/08/2023  Patient will be I with final HEP to maintain progress from PT. Baseline:  11/13/2022: progressing  Goal status: ONGOING  2.  Patient will report >/= 58% status on FOTO to indicate improved functional ability. Baseline: 52% 10/11/2022: 47% 11/13/2022: 45% Goal status: ONGOING  3.  Patient will perform >/= 900 ft in order to improve community ambulation Baseline: 555 ft 11/08/22: 641 feet Goal status: ONGOING  4.  Patient will demonstrate hip strength >/= 4/5 MMT and knee strength 5/5 MMT to improve his walking tolerance and steadiness Baseline: hip strength grossly 3/4 MMT and knee strength grossly 4+/5 MMT 11/13/2022: hip strength grossly 4-/5 MMT, right knee strength 4+/5 MMT Goal status: ONGOING   PLAN: PT FREQUENCY: 1-2x/week  PT DURATION: 8 weeks  PLANNED INTERVENTIONS: Therapeutic exercises, Therapeutic activity, Neuromuscular re-education, Balance training, Gait training, Patient/Family education, Self Care, Joint mobilization, Aquatic Therapy, Manual therapy, and Re-evaluation  PLAN FOR NEXT SESSION: Review HEP and progress PRN, focus on LE and postural strengthening, endurance training, balance training   Rosana Hoes, PT, DPT, LAT, ATC 11/22/22  11:46 AM Phone: [phone] Fax: [phone]     Date of referral: 08/28/2022 Referring provider: Osvaldo Shipper, MD Referring diagnosis? Muscle weakness (generalized) (M62.81)  Treatment diagnosis? (if different Keller referring diagnosis) Muscle weakness (generalized) (M62.81)  What was this (referring dx) caused by? []  Surgery (Type: _________) []  Fall [x]  Ongoing issue []  Arthritis []  Unspecified []  Work related []  Librarian, academic []  Other:  ____________  Ashby Dawes of Condition  []  Initial Onset (within last 3 months)  []  Recurrent (multiple episodes of < 3 months)  [x]  Chronic (continuous duration > 3 months)  Laterality: []  Rt []  Lt [x]  Both  Current Functional Measure Score  [x]  FOTO ____45%____ []  Neck Index ______  []  Back Index ______  []  DASH _______  []  LEFS________  Briefly describe symptoms: Chronic weakness and lack of confidence with gait and balance   How did symptoms start: Gradual onset, history of CABG and other cardiac medical history  Average pain intensity:  Last 24 hours: 0/10  Past week: 0/10  How often does the pt experience symptoms?  []  Constantly [x]  Frequently []  Occasionally []  Intermittently  How much have the symptoms interfered with usual daily activities?  []  Not at all  []  A little bit  [x]  Moderately  []  Quite a bit  []  Extremely  How has condition changed since care began at this facility?  []  NA - iniital visit  []  Much worse  []  Worse  []  A little worse  []  No change  [x]  A little  better  []  Better  []  Much better  In general, how is the patients overall health?  []  Excellend  []  Very good  [x]  Good  []  Fair  []  Poor

## 2022-11-29 ENCOUNTER — Ambulatory Visit: Payer: Medicare Other | Admitting: Physical Therapy

## 2022-11-29 ENCOUNTER — Other Ambulatory Visit: Payer: Self-pay

## 2022-11-29 ENCOUNTER — Encounter: Payer: Self-pay | Admitting: Physical Therapy

## 2022-11-29 DIAGNOSIS — R2689 Other abnormalities of gait and mobility: Secondary | ICD-10-CM

## 2022-11-29 DIAGNOSIS — M6281 Muscle weakness (generalized): Secondary | ICD-10-CM

## 2022-11-29 NOTE — Therapy (Signed)
OUTPATIENT PHYSICAL THERAPY TREATMENT    Patient Name: [name] MRN: [id number] DOB:[birth year], 82 y.o., male Today's Date: 11/29/2022   END OF SESSION:  PT End of Session - 11/29/22 0948     Visit Number 12    Number of Visits 18    Date for PT Re-Evaluation 01/08/23    Authorization Type [employer] MCR    Progress Note Due on Visit 20    PT Start Time 0937    PT Stop Time 1015    PT Time Calculation (min) 38 min    Activity Tolerance Patient tolerated treatment well    Behavior During Therapy [facility] for tasks assessed/performed                      Past Medical History:  Diagnosis Date   A-fib (HCC)    Allergy    Seasonal--pollen   Arrhythmia    CAD (coronary artery disease) 1999   RCA stent   Carotid stenosis, asymptomatic    moderate RT and mild LT with carotid dopplers 10/26/11   Colon cancer (HCC) 04/2006   Stage III--T3 N1   COPD (chronic obstructive pulmonary disease) (HCC)    H/O cardiovascular stress test 01/13/2010   low risk scan, similar to 2008 study   H/O echocardiogram 05/23/2009   EF >55%, mild MR, Mild TR,    Hand foot syndrome    Hemorrhoids    Hyperlipidemia    Hypertension    Neuropathy    OSA treated with BiPAP    severe OSA with an AHI fo 45/hr and O2 sats as low as 85%. On auto BiPAP   Permanent atrial fibrillation (HCC)    Psoriasis    Tubular adenoma of colon    Past Surgical History:  Procedure Laterality Date   ABDOMINAL AORTOGRAM W/LOWER EXTREMITY N/A 12/21/2021   Procedure: ABDOMINAL AORTOGRAM W/LOWER EXTREMITY;  Surgeon: [name], MD;  [facility]: [facility];  Service: Cardiovascular;  Laterality: N/A;   CARDIAC CATHETERIZATION     CLIPPING OF ATRIAL APPENDAGE  01/28/2018   Procedure: CLIPPING OF ATRIAL APPENDAGE;  Surgeon: Alleen Borne, MD;  Location: [facility];  Service: Open Heart Surgery;;   COLONOSCOPY     CORONARY ARTERY BYPASS GRAFT N/A 01/28/2018   Procedure: CORONARY ARTERY BYPASS GRAFTING  (CABG) X 4 USING LEFT INTERNAL MAMMARY ARTERY AND LEFT GREATER SAPHENOUS VEIN HARVESTED ENDOSCOPICALLY;  Surgeon: Alleen Borne, MD;  Location: [facility];  Service: Open Heart Surgery;  Laterality: N/A;   CORONARY STENT PLACEMENT  1999   RCA tandem NIR stents   LEFT HEART CATH AND CORONARY ANGIOGRAPHY N/A 01/23/2018   Procedure: LEFT HEART CATH AND CORONARY ANGIOGRAPHY;  Surgeon: Yvonne Kendall, MD;  Location: [facility];  Service: Cardiovascular;  Laterality: N/A;   PERIPHERAL VASCULAR ATHERECTOMY Bilateral 12/21/2021   Procedure: PERIPHERAL VASCULAR ATHERECTOMY;  Surgeon: [name], MD;  [facility]: [facility];  Service: Cardiovascular;  Laterality: Bilateral;  common illiac   PERIPHERAL VASCULAR INTERVENTION Bilateral 12/21/2021   Procedure: PERIPHERAL VASCULAR INTERVENTION;  Surgeon: [name], MD;  [facility]: [facility];  Service: Cardiovascular;  Laterality: Bilateral;  Common Illiacs   RIGHT COLECTOMY  05/29/2006   Laparoscopic assisted   RIGHT HEART CATH N/A 06/14/2021   Procedure: RIGHT HEART CATH;  Surgeon: Dolores Patty, MD;  Location: [facility];  Service: Cardiovascular;  Laterality: N/A;   TEE WITHOUT CARDIOVERSION N/A 01/28/2018  Procedure: TRANSESOPHAGEAL ECHOCARDIOGRAM (TEE);  Surgeon: Alleen Borne, MD;  Location[facility] OR;  Service: Open Heart Surgery;  Laterality: N/A;   TONSILLECTOMY     Patient Active Problem List   Diagnosis Date Noted   Atherosclerotic heart disease of native coronary artery with other forms of angina pectoris (HCC) 09/12/2022   Chronic diastolic heart failure (HCC) 09/12/2022   Near syncope 08/26/2022   Hyponatremia 08/26/2022   Symptomatic bradycardia 08/26/2022   Claudication in peripheral vascular disease (HCC) 12/21/2021   OSA treated with BiPAP 07/04/2021   Peripheral arterial disease (HCC) 04/25/2021   Pulmonary hypertension, unspecified (HCC) 03/22/2021   Edema of leg 02/26/2018   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   NSTEMI (non-ST elevated myocardial infarction) (HCC) 01/21/2018   History of colon cancer 08/19/2013   Essential hypertension 11/20/2012   Dyslipidemia, goal LDL below 70 11/20/2012   Chronic anticoagulation    Carotid stenosis, asymptomatic    CAD S/P percutaneous coronary angioplasty    Atrial fibrillation with slow ventricular response (HCC) 06/02/2012    PCP: Thana Ates, MD  REFERRING PROVIDER: Osvaldo Shipper, MD  REFERRING DIAG: Muscle weakness, Other abnormalities of gait and mobility, Unsteadiness on feet, Difficulty in walking, not elsewhere classified  THERAPY DIAG:  Muscle weakness (generalized)  Other abnormalities of gait and mobility  Rationale for Evaluation and Treatment: Rehabilitation  ONSET DATE: Chronic, about 2 years    SUBJECTIVE:  SUBJECTIVE STATEMENT: Pt reports he is a little stiffer than normal this morning. He does help his neighbor and he did a lot yesterday.   PAIN:  Are you having pain? No  PERTINENT HISTORY: See cardiac history above  PRECAUTIONS: Fall  WEIGHT BEARING RESTRICTIONS: No  PATIENT GOALS: Get stronger and improve walking   OBJECTIVE:  PATIENT SURVEYS:  FOTO 52% functional status  10/11/2022: 47%  11/13/2022: 45%  POSTURE:   Rounded shoulder posture  LOWER EXTREMITY MMT:  MMT Right eval Left eval Rt / Lt 11/13/2022  Hip flexion 4 4   Hip extension 3 3 4- / 4-  Hip abduction 3 3 4- / 4-  Hip adduction     Hip internal rotation     Hip external rotation     Knee flexion 4+ 4+ 5 / 5  Knee extension 4+ 4+ 4+ / 5  Ankle dorsiflexion     Ankle plantarflexion     Ankle inversion     Ankle eversion      (Blank rows = not tested)  FUNCTIONAL TESTS:  5 times sit to stand: 15 seconds  10/02/2022: 12 seconds Timed up and go (TUG): 12 seconds  10/30/2022: 10 seconds  11/13/2022: 9 seconds 6 minute walk test: 555 ft- 2 min rest break at 3 min 11/08/22: 641 feet -1 standing rest break  GAIT: Distance  walked: 555 ft Assistive device utilized: None Level of assistance: Complete Independence Comments: Decreased gait speed and step length, occasional unsteadiness   TODAY'S TREATMENT:     [facility] Adult PT Treatment:                                                DATE: 11/29/2022 Therapeutic Exercise: NuStep L6 x 6 min with UE/LE to improve workload capacity Leg press (cybex) 100# 3 x 10 Forward 8" step-up with single UE support 2 x 10 each Standing hip abduction with  green at knees standing on 4" box 3 x 10 each Sit to stand x 10 Tandem stance 3 x 15 sec or fatigue each Knee extension machine 25# 3 x 10   [facility] Adult PT Treatment:                                                DATE: 11/22/2022 Therapeutic Exercise: NuStep L6 x 6 min with UE/LE to improve workload capacity Supine piriformis push / pull stretch 3 x 20 sec each Sit to stand x 10, 2 x 5 Knee extension machine 25# 3 x 10 Side stepping at counter with red at knees x 6 lengths down/back Forward 8" step-up 2 x 10 each Standing heel raises x 20  [facility] Adult PT Treatment:                                                DATE: 11/13/2022 Therapeutic Exercise: NuStep L6 x 6 min with UE/LE to improve workload capacity Leg press (cybex) 100# x 8, 3 x 10 Forward 6" step-up 2 x 10 each Standing hip abduction and extension with yellow at knees 2 x 15 each Sit to stand 2 x 10  [facility] Adult PT Treatment:                                                DATE: 11/08/22 Therapeutic Exercise: Nustep L6 x 7 min with UE/LE to improve workload capacity Knee ext 35# 15 x 2 Figure stretches bilat SLR to fatigue Hip abduction to fatigue  STS x 6- fatigues Therapeutic Activity: 6 MWT 641 feet standing and seated rest break  [facility] Adult PT Treatment:                                                DATE: 10/30/2022 Therapeutic Exercise: NuStep L6 x 6 min with UE/LE to improve workload capacity Leg press (cybex) 100# 4 x 8 SLR 2 x 10 each Bridge 2 x  10 Sidelying hip abduction 2 x 10 each  Attempted sit to stand but patient reports right leg discomfort/weakness Knee extension machine 35# 3 x 10  PATIENT EDUCATION:  Education details: HEP Person educated: Patient Education method: Programmer, multimedia, Demonstration, Actor cues, Verbal cues Education comprehension: verbalized understanding, returned demonstration, verbal cues required, tactile cues required, and needs further education  HOME EXERCISE PROGRAM: Access Code: [email or web address]    ASSESSMENT: CLINICAL IMPRESSION: Patient tolerated therapy well with no adverse effects. Therapy focused on progressing LE strength and standing stability. He continues to do well with his strengthening exercises but he does require extended rest breaks for LE muscle recovery between sets. He does note challenge with balance tasks and exhibits difficulty with tandem stance, requiring occasional use of UE for support. No changes to HEP this visit. Patient would benefit from continued skilled PT to progress his mobility, strength, and balance in order to improve confidence with walking and maximize functional ability.   OBJECTIVE  IMPAIRMENTS: Abnormal gait, decreased activity tolerance, decreased balance, decreased endurance, decreased strength, postural dysfunction, and pain.   ACTIVITY LIMITATIONS: lifting, squatting, and locomotion level  PARTICIPATION LIMITATIONS: shopping and community activity  PERSONAL FACTORS: Fitness, Past/current experiences, and Time since onset of injury/illness/exacerbation are also affecting patient's functional outcome.    GOALS: Goals reviewed with patient? Yes  SHORT TERM GOALS: Target date: 10/15/2022  Patient will be I with initial HEP in order to progress with therapy. Baseline: HEP provided at eval 10/11/2022: independent Goal status: MET  2.  Patient will perform 5xSTS </= 12 seconds to indicate improved strength and reduced fall risk Baseline: 15 seconds 10/02/2022:  12 seconds Goal status: MET  3.  Patient will perform TUG </= 9 seconds to indicate improved mobility and confidence with walking Baseline: 12 seconds 10/30/2022: 10 seconds 11/13/2022: 9 seconds Goal status: MET  LONG TERM GOALS: Target date: 01/08/2023  Patient will be I with final HEP to maintain progress from PT. Baseline:  11/13/2022: progressing  Goal status: ONGOING  2.  Patient will report >/= 58% status on FOTO to indicate improved functional ability. Baseline: 52% 10/11/2022: 47% 11/13/2022: 45% Goal status: ONGOING  3.  Patient will perform >/= 900 ft in order to improve community ambulation Baseline: 555 ft 11/08/22: 641 feet Goal status: ONGOING  4.  Patient will demonstrate hip strength >/= 4/5 MMT and knee strength 5/5 MMT to improve his walking tolerance and steadiness Baseline: hip strength grossly 3/4 MMT and knee strength grossly 4+/5 MMT 11/13/2022: hip strength grossly 4-/5 MMT, right knee strength 4+/5 MMT Goal status: ONGOING   PLAN: PT FREQUENCY: 1-2x/week  PT DURATION: 8 weeks  PLANNED INTERVENTIONS: Therapeutic exercises, Therapeutic activity, Neuromuscular re-education, Balance training, Gait training, Patient/Family education, Self Care, Joint mobilization, Aquatic Therapy, Manual therapy, and Re-evaluation  PLAN FOR NEXT SESSION: Review HEP and progress PRN, focus on LE and postural strengthening, endurance training, balance training   Rosana Hoes, PT, DPT, LAT, ATC 11/29/22  10:15 AM Phone: [phone] Fax: [phone]

## 2022-12-03 ENCOUNTER — Ambulatory Visit: Payer: Medicare Other | Admitting: Physical Therapy

## 2022-12-06 ENCOUNTER — Ambulatory Visit: Payer: Medicare Other | Admitting: Physical Therapy

## 2022-12-06 ENCOUNTER — Encounter: Payer: Self-pay | Admitting: Physical Therapy

## 2022-12-06 DIAGNOSIS — M6281 Muscle weakness (generalized): Secondary | ICD-10-CM | POA: Diagnosis not present

## 2022-12-06 DIAGNOSIS — R2689 Other abnormalities of gait and mobility: Secondary | ICD-10-CM

## 2022-12-10 ENCOUNTER — Ambulatory Visit: Payer: Medicare Other | Admitting: Physical Therapy

## 2022-12-17 ENCOUNTER — Ambulatory Visit: Payer: Medicare Other | Admitting: Physical Therapy

## 2022-12-18 ENCOUNTER — Other Ambulatory Visit: Payer: Self-pay

## 2022-12-18 ENCOUNTER — Other Ambulatory Visit: Payer: Self-pay | Admitting: Cardiovascular Disease

## 2023-02-19 ENCOUNTER — Other Ambulatory Visit: Payer: Self-pay | Admitting: Cardiovascular Disease

## 2023-02-21 ENCOUNTER — Encounter (INDEPENDENT_AMBULATORY_CARE_PROVIDER_SITE_OTHER): Payer: Self-pay

## 2023-02-22 ENCOUNTER — Telehealth: Payer: Self-pay | Admitting: Cardiovascular Disease

## 2023-02-22 NOTE — Telephone Encounter (Signed)
*  STAT* If patient is at the pharmacy, call can be transferred to refill team.   1. Which medications need to be refilled? (please list name of each medication and dose if known)   atorvastatin (LIPITOR) 80 MG tablet    2. Which pharmacy/location (including street and city if local pharmacy) is medication to be sent to?  [facility] [place], [place] - [address]      3. Do they need a 30 day or 90 day supply? 90 day

## 2023-03-18 ENCOUNTER — Other Ambulatory Visit: Payer: Self-pay | Admitting: Cardiovascular Disease

## 2023-03-18 DIAGNOSIS — I5032 Chronic diastolic (congestive) heart failure: Secondary | ICD-10-CM

## 2023-04-15 ENCOUNTER — Encounter: Payer: Self-pay | Admitting: Nurse Practitioner

## 2023-04-15 ENCOUNTER — Ambulatory Visit: Payer: Medicare Other | Attending: Nurse Practitioner | Admitting: Nurse Practitioner

## 2023-04-15 VITALS — BP 130/54 | HR 53 | Ht 75.0 in | Wt 204.6 lb

## 2023-04-15 DIAGNOSIS — R55 Syncope and collapse: Secondary | ICD-10-CM | POA: Diagnosis not present

## 2023-04-15 DIAGNOSIS — Z9861 Coronary angioplasty status: Secondary | ICD-10-CM

## 2023-04-15 DIAGNOSIS — I739 Peripheral vascular disease, unspecified: Secondary | ICD-10-CM

## 2023-04-15 DIAGNOSIS — I2510 Atherosclerotic heart disease of native coronary artery without angina pectoris: Secondary | ICD-10-CM | POA: Diagnosis not present

## 2023-04-15 DIAGNOSIS — I272 Pulmonary hypertension, unspecified: Secondary | ICD-10-CM | POA: Diagnosis not present

## 2023-04-15 DIAGNOSIS — I10 Essential (primary) hypertension: Secondary | ICD-10-CM | POA: Diagnosis not present

## 2023-04-15 DIAGNOSIS — I6523 Occlusion and stenosis of bilateral carotid arteries: Secondary | ICD-10-CM

## 2023-04-15 DIAGNOSIS — G4733 Obstructive sleep apnea (adult) (pediatric): Secondary | ICD-10-CM

## 2023-04-15 DIAGNOSIS — I4891 Unspecified atrial fibrillation: Secondary | ICD-10-CM

## 2023-04-15 NOTE — Progress Notes (Addendum)
Office Visit    Patient Name: [name] Date of Encounter: 04/15/2023  Primary Care Provider:  Thana Ates, MD Primary Cardiologist:  Nanetta Batty, MD  Chief Complaint    83 year old male with a history of CAD s/p RCA stent in 1999, CABG x 4 in 2019, chronic atrial fibrillation with slow ventricular rate, severe TR, pulmonary hypertension, PAD s/p orbital atherectomy/VBX stenting to bilateral iliac arteries, colon cancer s/p right colectomy, OSA and COPD who presents for follow-up related to CAD and atrial fibrillation.   Past Medical History    Past Medical History:  Diagnosis Date   A-fib (HCC)    Allergy    Seasonal--pollen   Arrhythmia    CAD (coronary artery disease) 1999   RCA stent   Carotid stenosis, asymptomatic    moderate RT and mild LT with carotid dopplers 10/26/11   Colon cancer (HCC) 04/2006   Stage III--T3 N1   COPD (chronic obstructive pulmonary disease) (HCC)    H/O cardiovascular stress test 01/13/2010   low risk scan, similar to 2008 study   H/O echocardiogram 05/23/2009   EF >55%, mild MR, Mild TR,    Hand foot syndrome    Hemorrhoids    Hyperlipidemia    Hypertension    Neuropathy    OSA treated with BiPAP    severe OSA with an AHI fo 45/hr and O2 sats as low as 85%. On auto BiPAP   Permanent atrial fibrillation (HCC)    Psoriasis    Tubular adenoma of colon    Past Surgical History:  Procedure Laterality Date   ABDOMINAL AORTOGRAM W/LOWER EXTREMITY N/A 12/21/2021   Procedure: ABDOMINAL AORTOGRAM W/LOWER EXTREMITY;  Surgeon: [name], MD;  Location: [facility];  Service: Cardiovascular;  Laterality: N/A;   CARDIAC CATHETERIZATION     CLIPPING OF ATRIAL APPENDAGE  01/28/2018   Procedure: CLIPPING OF ATRIAL APPENDAGE;  Surgeon: Alleen Borne, MD;  Location: [facility] OR;  Service: Open Heart Surgery;;   COLONOSCOPY     CORONARY ARTERY BYPASS GRAFT N/A 01/28/2018   Procedure: CORONARY ARTERY BYPASS GRAFTING (CABG) X 4 USING  LEFT INTERNAL MAMMARY ARTERY AND LEFT GREATER SAPHENOUS VEIN HARVESTED ENDOSCOPICALLY;  Surgeon: Alleen Borne, MD;  Location: [facility] OR;  Service: Open Heart Surgery;  Laterality: N/A;   CORONARY STENT PLACEMENT  1999   RCA tandem NIR stents   LEFT HEART CATH AND CORONARY ANGIOGRAPHY N/A 01/23/2018   Procedure: LEFT HEART CATH AND CORONARY ANGIOGRAPHY;  Surgeon: Yvonne Kendall, MD;  Location: [facility];  Service: Cardiovascular;  Laterality: N/A;   PERIPHERAL VASCULAR ATHERECTOMY Bilateral 12/21/2021   Procedure: PERIPHERAL VASCULAR ATHERECTOMY;  Surgeon: [name], MD;  Location: [facility];  Service: Cardiovascular;  Laterality: Bilateral;  common illiac   PERIPHERAL VASCULAR INTERVENTION Bilateral 12/21/2021   Procedure: PERIPHERAL VASCULAR INTERVENTION;  Surgeon: [name], MD;  Location: [facility];  Service: Cardiovascular;  Laterality: Bilateral;  Common Illiacs   RIGHT COLECTOMY  05/29/2006   Laparoscopic assisted   RIGHT HEART CATH N/A 06/14/2021   Procedure: RIGHT HEART CATH;  Surgeon: Dolores Patty, MD;  Location: [facility];  Service: Cardiovascular;  Laterality: N/A;   TEE WITHOUT CARDIOVERSION N/A 01/28/2018   Procedure: TRANSESOPHAGEAL ECHOCARDIOGRAM (TEE);  Surgeon: Alleen Borne, MD;  Location: [facility] OR;  Service: Open Heart Surgery;  Laterality: N/A;   TONSILLECTOMY      Allergies  No Known Allergies  Labs/Other Studies Reviewed    The following studies were reviewed today:  Cardiac Studies & Procedures   CARDIAC CATHETERIZATION  CARDIAC CATHETERIZATION 06/14/2021  Narrative Findings:  RA = 12 (v- waves to 23) RV = 40/14 PA = 43/11 (26) PCW = 14 (v- waves up to 35) Fick cardiac output/index = 6.6/2.9 PVR = 1.8 WU Ao sat = 93% PA sat = 64%, 69% PAPi = 2.7  Assessment: 1. Mild pulmonary HTN with normal PVR and normal cardiac output 2. Prominent v-waves in bot RA and PCWP tracings suggestive of significant  TR/MR  Plan/Discussion:  Suspect PH related to valvular disease. Will need TEE to further evaluate. There is near equalization of pressures but suspicion for post-op constriction, not overly high. cMRI pending.  Arvilla Meres, MD 12:31 PM   CARDIAC CATHETERIZATION  CARDIAC CATHETERIZATION 01/23/2018  Narrative Conclusions: 1. Severe multivessel coronary artery disease, including calcified 90% stenoses involving the LMCA, LAD, and RCA. 2. Patent stent in the mid RCA; 90% mid RCA stenosis is located just proximal to the stent. 3. Normal left ventricular systolic function and filling pressure.  Recommendations: 1. Inpatient cardiac surgery consultation for CABG. 2. Restart heparin infusion 2 hours after deflation of TR band. 3. Aggressive secondary prevention.  Recommend to resume apixaban following CABG.  Recommend concurrent antiplatelet therapy of aspirin 81mg  daily for at least 12 months.  Yvonne Kendall, MD [facility] Pager: [phone]  Findings Coronary Findings Diagnostic  Dominance: Right  Left Main Vessel is large. Mid LM lesion is 90% stenosed. The lesion is eccentric. The lesion is severely calcified.  Left Anterior Descending Mid LAD-1 lesion is 90% stenosed. Mid LAD-2 lesion is 70% stenosed. Dist LAD lesion is 80% stenosed.  First Diagonal Branch Vessel is small in size.  Second Diagonal Branch Vessel is moderate in size.  Third Diagonal Branch Vessel is small in size.  Left Circumflex Vessel is moderate in size.  First Obtuse Marginal Branch Vessel is small in size.  Second Obtuse Marginal Branch Vessel is small in size.  Right Coronary Artery Vessel is large. Prox RCA lesion is 30% stenosed. Prox RCA to Mid RCA lesion is 30% stenosed. The lesion is calcified. Mid RCA-1 lesion is 90% stenosed. The lesion is eccentric. The lesion is calcified. Previously placed Mid RCA-2 stent (unknown type) is widely patent. Dist RCA lesion is  50% stenosed. The lesion is eccentric.  Right Posterior Descending Artery Vessel is moderate in size.  Right Posterior Atrioventricular Artery Vessel is large in size.  Intervention  No interventions have been documented.   STRESS TESTS  NM MYOCAR MULTI W/SPECT W 01/13/2010  ECHOCARDIOGRAM  ECHOCARDIOGRAM COMPLETE 08/27/2022  Narrative ECHOCARDIOGRAM REPORT    Patient Name:   [name] Date of Exam: 08/27/2022 Medical Rec #:  [id number]        Height:       75.0 in Accession #:    [id number]       Weight:       209.9 lb Date of Birth:  [birth year]       BSA:          2.240 m Patient Age:    81 years         BP:           145/61 mmHg Patient Gender: M                HR:           45 bpm. Exam Location:  Inpatient  Procedure: 2D Echo, Cardiac Doppler and Color Doppler  Indications:    syncope  History:        Patient has prior history of Echocardiogram examinations, most recent 12/20/2020. Prior CABG, Pulmonary HTN and PAD, Arrythmias:Bradycardia and Atrial Fibrillation, Signs/Symptoms:Syncope and Edema; Risk Factors:Sleep Apnea and Hypertension.  Sonographer:    Delcie Roch RDCS Referring Phys: [id number] TIMOTHY S OPYD  IMPRESSIONS   1. Patient bradycardic with HR 30-low 40s during exam. 2. Left ventricular ejection fraction, by estimation, is 55 to 60%. The left ventricle has normal function. The left ventricle has no regional wall motion abnormalities. There is mild left ventricular hypertrophy. Left ventricular diastolic parameters are indeterminate. 3. Right ventricular systolic function is mildly reduced. The right ventricular size is mildly enlarged. There is moderately elevated pulmonary artery systolic pressure. The estimated right ventricular systolic pressure is 53.9 mmHg. 4. Left atrial size was moderately dilated. 5. Right atrial size was severely dilated. 6. The mitral valve is degenerative. Trivial mitral valve regurgitation. 7. Tricuspid  valve regurgitation is moderate. 8. The aortic valve is tricuspid. There is mild calcification of the aortic valve. There is mild thickening of the aortic valve. Aortic valve regurgitation is not visualized. Aortic valve sclerosis/calcification is present, without any evidence of aortic stenosis. 9. Aortic dilatation noted. There is borderline dilatation of the aortic root, measuring 40 mm. 10. The inferior vena cava is dilated in size with <50% respiratory variability, suggesting right atrial pressure of 15 mmHg.  Comparison(s): Compared to prior TTE in 12/2020, the patient is now bradycardic with HR 30-40s. Hyperdynamic LV function is no longer present but EF still within normal range.  FINDINGS Left Ventricle: Left ventricular ejection fraction, by estimation, is 55 to 60%. The left ventricle has normal function. The left ventricle has no regional wall motion abnormalities. The left ventricular internal cavity size was normal in size. There is mild left ventricular hypertrophy. Abnormal (paradoxical) septal motion consistent with post-operative status. Left ventricular diastolic parameters are indeterminate.  Right Ventricle: The right ventricular size is mildly enlarged. No increase in right ventricular wall thickness. Right ventricular systolic function is mildly reduced. There is moderately elevated pulmonary artery systolic pressure. The tricuspid regurgitant velocity is 3.12 m/s, and with an assumed right atrial pressure of 15 mmHg, the estimated right ventricular systolic pressure is 53.9 mmHg.  Left Atrium: Left atrial size was moderately dilated.  Right Atrium: Right atrial size was severely dilated.  Pericardium: There is no evidence of pericardial effusion.  Mitral Valve: The mitral valve is degenerative in appearance. There is mild thickening of the mitral valve leaflet(s). There is mild calcification of the mitral valve leaflet(s). Mild mitral annular calcification. Trivial mitral  valve regurgitation.  Tricuspid Valve: The tricuspid valve is normal in structure. Tricuspid valve regurgitation is moderate.  Aortic Valve: The aortic valve is tricuspid. There is mild calcification of the aortic valve. There is mild thickening of the aortic valve. Aortic valve regurgitation is not visualized. Aortic valve sclerosis/calcification is present, without any evidence of aortic stenosis.  Pulmonic Valve: The pulmonic valve was normal in structure. Pulmonic valve regurgitation is trivial.  Aorta: Aortic dilatation noted. There is borderline dilatation of the aortic root, measuring 40 mm.  Venous: The inferior vena cava is dilated in size with less than 50% respiratory variability, suggesting right atrial pressure of 15 mmHg.  IAS/Shunts: The atrial septum is grossly normal.   LEFT VENTRICLE PLAX 2D LVIDd:         4.30 cm  Diastology LVIDs:         3.20 cm   LV e' medial:    5.22 cm/s LV PW:         1.30 cm   LV E/e' medial:  25.9 LV IVS:        1.30 cm   LV e' lateral:   12.30 cm/s LVOT diam:     2.20 cm   LV E/e' lateral: 11.0 LV SV:         102 LV SV Index:   45 LVOT Area:     3.80 cm   RIGHT VENTRICLE            IVC RV Basal diam:  4.00 cm    IVC diam: 3.00 cm RV S prime:     9.14 cm/s TAPSE (M-mode): 1.8 cm  LEFT ATRIUM              Index        RIGHT ATRIUM           Index LA diam:        4.40 cm  1.96 cm/m   RA Area:     30.70 cm LA Vol (A2C):   96.5 ml  43.08 ml/m  RA Volume:   109.00 ml 48.66 ml/m LA Vol (A4C):   102.0 ml 45.54 ml/m LA Biplane Vol: 103.0 ml 45.99 ml/m AORTIC VALVE LVOT Vmax:   107.00 cm/s LVOT Vmean:  70.200 cm/s LVOT VTI:    0.268 m  AORTA Ao Root diam: 3.30 cm Ao Asc diam:  3.70 cm  MITRAL VALVE                TRICUSPID VALVE MV Area (PHT): 3.27 cm     TR Peak grad:   38.9 mmHg MV Decel Time: 232 msec     TR Vmax:        312.00 cm/s MV E velocity: 135.00 cm/s SHUNTS Systemic VTI:  0.27 m Systemic Diam: 2.20  cm  Laurance Flatten MD Electronically signed by Laurance Flatten MD Signature Date/Time: 08/27/2022/11:53:55 AM    Final  TEE  ECHO TEE 01/28/2018  Interpretation Summary  Septum: No Patent Foramen Ovale present.  Left atrium: Patent foramen ovale not present.  Aortic valve: The valve is trileaflet. Mild valve thickening present. No stenosis. No regurgitation.  Mitral valve: Mild leaflet thickening is present.  Right ventricle: Normal cavity size, wall thickness and ejection fraction.  MONITORS  LONG TERM MONITOR (3-14 DAYS) 10/08/2022  Narrative Patch Wear Time:  9 days and 6 hours (2024-06-27T08:43:09-398 to 2024-07-06T15:21:40-0400)  3 Ventricular Tachycardia runs occurred, the run with the fastest interval lasting 12 beats with a max rate of 144 bpm, the longest lasting 12 beats with an avg rate of 108 bpm. Atrial Fibrillation occurred continuously (100% burden), ranging from 32-101 bpm (avg of 46 bpm). Intermittent Bundle Branch Block was present. Isolated VEs were rare (<1.0%), VE Couplets were rare (<1.0%), and no VE Triplets were present. Ventricular Trigeminy was present. MD notification criteria for Slow Atrial Fibrillation met - report posted prior to notification per account request (MG).  Underlying rhythm AFIB with 100% burden Occasional aberrantly  conducted beats/PVCs Short runs of NSVT Needs ROV to discuss with me or an APP   CARDIAC MRI  MR CARDIAC MORPHOLOGY W WO CONTRAST 02/06/2022  Narrative CLINICAL DATA:  R/O Amyloid  EXAM: CARDIAC MRI  TECHNIQUE: The patient was scanned on a 1.5 Tesla Siemens magnet. A dedicated cardiac coil was used.  Functional imaging was done using Fiesta sequences. 2,3, and 4 chamber views were done to assess for RWMA's. Modified Simpson's rule using a short axis stack was used to calculate an ejection fraction on a dedicated work [employer]. The patient received 13 cc of Gadavist. After  10 minutes inversion recovery sequences were used to assess for infiltration and scar tissue.  CONTRAST:  ZOXWRUEA  FINDINGS: Severe RAE. Moderate LAE. 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 Normal appearing MV trivial MR. AV is tri leaflet with no significant AR or restriction to motion. Normal PV/TV with normal RVOT Normal LV size and function Mild LVH 13 mm septum. Quantitative EF 71% (EDV 184 ESV 53 cc SV 131 cc) Inversion recovery sequences with gadolinium showed no scar, infarct or infiltration No evidence of amyloid. Normal RV size and function Quantitative RVEF 55% (EDV 244 cc ESV 110 cc SV 134 cc )  T1 1019 msec  ECV Using Hct of 37 27%  T2 normal 47 msec  CO using flow analysis through the AV equal to 5.1 L/min  IMPRESSION: 1.  Severe RAE, moderate LAE  2.  Mild LVH normal LVEF 71% with no RWMAls  3. Normal post gadolinium images no evidence of amyloid or infiltrative disease  4.  Normal parametric measures see above  5.  Normal CO 5.1 L/min  6.  Normal RV size and function RVEF 55%  7.  Moderate ascending thoracic dilatation 4.2 cm  Charlton Haws   Electronically Signed By: Charlton Haws M.D. On: 02/06/2022 17:03        Recent Labs: 06/29/2022: ALT 54 08/26/2022: Magnesium 2.2; TSH 2.798 09/11/2022: BNP 49.1; BUN 19; Creatinine, Ser 0.99; Hemoglobin 13.9; Platelets 203; Potassium 4.5; Sodium 135  Recent Lipid Panel    Component Value Date/Time   CHOL 99 12/22/2021 0144   TRIG 59 12/22/2021 0144   HDL 38 (L) 12/22/2021 0144   CHOLHDL 2.6 12/22/2021 0144   VLDL 12 12/22/2021 0144   LDLCALC 49 12/22/2021 0144    History of Present Illness    83 year old male with the above past medical history including CAD s/p RCA stent in 1999, CABG x 4 in 2019, chronic atrial fibrillation with slow ventricular rate, severe TR, pulmonary hypertension, PAD s/p orbital atherectomy/VBX  stenting to bilateral iliac arteries, colon cancer s/p right colectomy, OSA and COPD.   He has a history of CAD s/p stenting-RCA in 1999. Cardiac catheterization in 2019 in the setting of NSTEMI revealed multivessel CAD, including left main disease.  He underwent CABG x 4 (LIMA-LAD, SVG-OM, SVG-PDA/PLA) in 2019.  Echocardiogram in 12/2020 revealed severe TR, pulmonary hypertension.  Right heart cath in 05/2021 showed mild pulmonary hypertension with V waves in both the right atrium and wedge positions suggesting MR and TR.  Cardiac MRI was negative for amyloid or sarcoid.  He also has a history of severe PAD with bilateral iliac and right SFA disease s/p orbital atherectomy/VBX stenting to bilateral iliac arteries in 12/2021.  Additionally he has a history of chronic atrial fibrillation on Eliquis with slow ventricular response.  He has been evaluated by EP.  Ongoing monitoring of heart rate/symptoms was advised.  He was hospitalized in June 2024 in the setting of near syncope. Echocardiogram was essentially normal.  It was felt that he was likely hypovolemic. Spironolactone was discontinued, furosemide was reduced to 40 mg daily.  He was bradycardic.  EP was consulted who felt that  his slow heart rate was not causing any significant symptoms.  Pacemaker was deferred.  He was later seen by hypertension clinic Pharm.D. Lasix was increased to 40 mg twice daily, metolazone was increased to 2.5 mg 3 times weekly.  He was started on Farxiga. Given baseline bradycardia, 14 day Zio patch was ordered, which revealed continuous atrial fibrillation (100% burden), bradycardia with average heart rate in the 40s, intermittent bundle branch block, short runs of NSVT.  He was lst seen in the office on 10/16/2022 and was stable from a cardiac standpoint. He was asymptomatic with bradycardia, and once again, PPM was deferred.  He saw Dr. Mayford Knife on 11/05/2022 for management of OSA and was doing well.   He presents today for  follow-up.  Since his last visit he has been stable overall from a cardiac standpoint.  He has stable mild nonpitting bilateral ankle edema, he denies any chest pain, palpitations, dizziness, presyncope, syncope, dyspnea, PND, orthopnea, or weight gain.  He remains active and continues to exercise multiple days a week.  He has noted over the past 3 months increased lower extremity weakness, dull aching pain with prolonged activity, concerning for worsening claudication.  Otherwise, he denies any additional concerns today.  Home Medications    Current Outpatient Medications  Medication Sig Dispense Refill   Alpha-Lipoic Acid 600 MG TABS Take 1,200 mg by mouth daily.     atorvastatin (LIPITOR) 80 MG tablet TAKE 1 TABLET AT 6PM. 90 tablet 1   cholecalciferol (VITAMIN D) 1000 UNITS tablet Take 1,000 Units by mouth daily.     clopidogrel (PLAVIX) 75 MG tablet TAKE ONE TAB DAILY WITH BREAKFAST 90 tablet 2   Coenzyme Q10 200 MG capsule Take 200 mg by mouth daily.      Cyanocobalamin (VITAMIN B 12 PO) Take 200 mcg by mouth daily.     ELIQUIS 5 MG TABS tablet TAKE 1 TABLET BY MOUTH TWICE DAILY. 180 tablet 1   FARXIGA 10 MG TABS tablet Take 1 tablet (10 mg total) by mouth daily. 30 tablet 5   Flaxseed, Linseed, 1000 MG CAPS Take 1,000 mg by mouth daily.     furosemide (LASIX) 40 MG tablet Take 1 tablet (40 mg total) by mouth 2 (two) times daily. 60 tablet 2   Magnesium Citrate 200 MG TABS Take 400 mg by mouth daily in the afternoon.     metolazone (ZAROXOLYN) 2.5 MG tablet Take 1 tablet by mouth on Monday, Wednesday, and Friday 30 tablet 2   Polyethyl Glycol-Propyl Glycol (SYSTANE) 0.4-0.3 % GEL ophthalmic gel Place 1 application. into both eyes every 8 (eight) hours as needed (dry eyes).     potassium chloride SA (KLOR-CON M) 20 MEQ tablet Take 1 tablet (20 mEq total) by mouth daily. 90 tablet 3   pyridoxine (B-6) 200 MG tablet Take 200 mg by mouth daily.     vitamin C (ASCORBIC ACID) 500 MG tablet Take  500 mg by mouth 3 (three) times daily.      No current facility-administered medications for this visit.     Review of Systems    He denies chest pain, palpitations, dyspnea, pnd, orthopnea, n, v, dizziness, syncope, weight gain, or early satiety. All other systems reviewed and are otherwise negative except as noted above.   Physical Exam    VS:  BP (!) 130/54   Pulse (!) 53   Ht 6\' 3"  (1.905 m)   Wt 204 lb 9.6 oz (92.8 kg)   SpO2 94%  BMI 25.57 kg/m   GEN: Well nourished, well developed, in no acute distress. HEENT: normal. Neck: Supple, no JVD, carotid bruits, or masses. Cardiac: IRIR, no murmurs, rubs, or gallops. No clubbing, cyanosis, mild nonpitting bilateral ankle edema.  Radials/DP/PT 2+ and equal bilaterally.  Respiratory:  Respirations regular and unlabored, clear to auscultation bilaterally. GI: Soft, nontender, nondistended, BS + x 4. MS: no deformity or atrophy. Skin: warm and dry, no rash. Neuro:  Strength and sensation are intact. Psych: Normal affect.  Accessory Clinical Findings    ECG personally reviewed by me today - EKG Interpretation Date/Time:  Monday April 15 2023 10:40:10 EST Ventricular Rate:  53 PR Interval:    QRS Duration:  146 QT Interval:  516 QTC Calculation: 484 R Axis:   -62  Text Interpretation: Atrial fibrillation with slow ventricular response Right bundle branch block Left anterior fascicular block Bifascicular block When compared with ECG of 16-Oct-2022 10:01, No significant change was found Confirmed by Bernadene Person ([id number]) on 04/15/2023 10:42:27 AM  - no acute changes.   Lab Results  Component Value Date   WBC 9.0 09/11/2022   HGB 13.9 09/11/2022   HCT 41.1 09/11/2022   MCV 92 09/11/2022   PLT 203 09/11/2022   Lab Results  Component Value Date   CREATININE 0.99 09/11/2022   BUN 19 09/11/2022   NA 135 09/11/2022   K 4.5 09/11/2022   CL 93 (L) 09/11/2022   CO2 25 09/11/2022   Lab Results  Component Value Date   ALT  54 (H) 06/29/2022   AST 46 (H) 06/29/2022   ALKPHOS 87 06/29/2022   BILITOT 1.8 (H) 06/29/2022   Lab Results  Component Value Date   CHOL 99 12/22/2021   HDL 38 (L) 12/22/2021   LDLCALC 49 12/22/2021   TRIG 59 12/22/2021   CHOLHDL 2.6 12/22/2021    Lab Results  Component Value Date   HGBA1C 5.3 01/22/2018    Assessment & Plan   1. Presyncope: Felt to be largely due to hypovolemia.  Echocardiogram was essentially normal.  He does have baseline bradycardia.  Evaluated by EP who felt that pacemaker was not indicated.  Denies any recurrent syncope, denies dizziness.  14-day ZIO as below.  Continue to monitor symptoms.  Discussed ED precautions.   2. Atrial fibrillation with slow VR:  14 day Zio patch was ordered, which revealed continuous atrial fibrillation (100% burden), bradycardia with average heart rate in the 40s, intermittent bundle branch block, short runs of NSVT. Evaluated by EP who felt that pacemaker was not indicated. Denies syncope.  He is due for routine labs (CBC, BMET), he has follow-up scheduled with his PCP on 04/18/2023 and will have labs at this time. Continue Eliquis.   3. CAD: S/p stenting-RCA in 1999. Cardiac catheterization in 2019 in the setting of NSTEMI revealed multivessel CAD, including left main disease.  He underwent CABG x 4 (LIMA-LAD, SVG-OM, SVG-PDA/PLA) in 2019.  Stable with no anginal symptoms.  No indication for ischemic evaluation.  Continue aspirin, Plavix, Lipitor.   4. Pulmonary hypertension/severe TR: Right heart cath in 05/2021 showed mild pulmonary hypertension with V waves in both the right atrium and wedge positions suggesting MR and TR.  Cardiac MRI was negative for amyloid or sarcoid.  Lasix was recently decreased and spironolactone was discontinued in the setting of near syncope, possible hypovolemia.  He subsequently experienced symptoms of fluid volume overload with increased lower extremity edema, shortness of breath. Lasix was increased to 40  mg  twice daily, metolazone was increased to 2.5 mg 3 times weekly.  He was started on Farxiga.  Generally euvolemic, and well compensated on exam.  Continue Farxiga, Lasix, and metolazone.   5. PAD/carotid artery stenosis:  H/o bilateral iliac and right SFA disease s/p orbital atherectomy/VBX stenting to bilateral iliac arteries in 12/2021.  Denies any significant claudication.  Most recent Doppler studies performed 06/25/2022 suggested patent stents. Carotid ultrasound in 06/2022 revealed 40 to 59% R ICA stenosis, 1 to 39% LICA stenosis.  He does have concern for worsening claudication over the past 3 months.  He continues to exercise regularly despite this, but he does note dull aching pain, generalized weakness in his legs with prolonged activity.   Pending repeat studies in 06/2023.  Follow-up with Dr. Allyson Sabal after repeat scans.  Continue Lipitor.    6. Hyperlipidemia: LDL was 48 in 12/2022.  Continue Lipitor.  7. OSA: Adherent to BiPAP.    8. Disposition: Follow-up in 06/2022 with Dr. Allyson Sabal.       Joylene Grapes, NP 04/15/2023, 11:27 AM

## 2023-04-15 NOTE — Patient Instructions (Signed)
Medication Instructions:  Your physician recommends that you continue on your current medications as directed. Please refer to the Current Medication list given to you today.  *If you need a refill on your cardiac medications before your next appointment, please call your pharmacy*   Lab Work: NONE ordered at this time of appointment     Testing/Procedures: NONE ordered at this time of appointment     Follow-Up: At [facility], you and your health needs are our priority.  As part of our continuing mission to provide you with exceptional heart care, we have created designated Provider Care Teams.  These Care Teams include your primary Cardiologist (physician) and Advanced Practice Providers (APPs -  Physician Assistants and Nurse Practitioners) who all work together to provide you with the care you need, when you need it.  We recommend signing up for the patient portal called "MyChart".  Sign up information is provided on this After Visit Summary.  MyChart is used to connect with patients for Virtual Visits (Telemedicine).  Patients are able to view lab/test results, encounter notes, upcoming appointments, etc.  Non-urgent messages can be sent to your provider as well.   To learn more about what you can do with MyChart, go to [email or web address].    Your next appointment:    April 2025  Provider:   Nanetta Batty, MD

## 2023-04-18 DIAGNOSIS — F5101 Primary insomnia: Secondary | ICD-10-CM | POA: Diagnosis not present

## 2023-04-18 DIAGNOSIS — Z79899 Other long term (current) drug therapy: Secondary | ICD-10-CM | POA: Diagnosis not present

## 2023-04-19 ENCOUNTER — Other Ambulatory Visit: Payer: Self-pay | Admitting: Cardiovascular Disease

## 2023-04-19 DIAGNOSIS — I10 Essential (primary) hypertension: Secondary | ICD-10-CM

## 2023-05-11 ENCOUNTER — Telehealth: Payer: Self-pay | Admitting: Student

## 2023-05-11 NOTE — Telephone Encounter (Signed)
   Patient called Answering Service with concerns for bradycardia. Called and spoke with patient. He has known permanent atrial fibrillation with slow ventricular hear rates. Rates typically run in the 40s to 50s. He has previously been seen by EP (Dr Nelly Laurence) and not felt to need a PPM. He got nervous this morning because his heart rate was intermittently dropping to the mid 30s. He is completely asymptomatic. Currently resting heart rates are in the 40s but will rate will come up to the 60s with walking. He has not checked his BP yet today.   This is a chronic issue, and I don't think anything needs to be done urgently over the weekend. He is not on any AV nodal agents.  I do not think he needs to go to the ED as long as he remains asymptomatic and BP is okay. Advised patient to let us know if heart rate is staying consistently in the 30s. Patient voiced understanding and thanked me for calling.   I will route this note to Dr. Nelly Laurence just as an Lorain Childes.  Corrin Parker, PA-C 05/11/2023 11:13 AM

## 2023-05-17 ENCOUNTER — Emergency Department (HOSPITAL_COMMUNITY)
Admission: EM | Admit: 2023-05-17 | Discharge: 2023-05-17 | Disposition: A | Discharge status: Home / Self Care | Payer: Medicare Other | Attending: Emergency Medicine | Admitting: Emergency Medicine

## 2023-05-17 ENCOUNTER — Telehealth: Payer: Medicare Other | Admitting: Physician Assistant

## 2023-05-17 ENCOUNTER — Encounter (HOSPITAL_COMMUNITY): Payer: Self-pay

## 2023-05-17 ENCOUNTER — Emergency Department (HOSPITAL_COMMUNITY): Payer: Medicare Other

## 2023-05-17 ENCOUNTER — Other Ambulatory Visit: Payer: Self-pay

## 2023-05-17 DIAGNOSIS — R609 Edema, unspecified: Secondary | ICD-10-CM

## 2023-05-17 DIAGNOSIS — R600 Localized edema: Secondary | ICD-10-CM | POA: Diagnosis not present

## 2023-05-17 DIAGNOSIS — M7989 Other specified soft tissue disorders: Secondary | ICD-10-CM

## 2023-05-17 DIAGNOSIS — E876 Hypokalemia: Secondary | ICD-10-CM | POA: Diagnosis not present

## 2023-05-17 DIAGNOSIS — R2242 Localized swelling, mass and lump, left lower limb: Secondary | ICD-10-CM | POA: Insufficient documentation

## 2023-05-17 DIAGNOSIS — I480 Paroxysmal atrial fibrillation: Secondary | ICD-10-CM | POA: Insufficient documentation

## 2023-05-17 DIAGNOSIS — Z7901 Long term (current) use of anticoagulants: Secondary | ICD-10-CM | POA: Diagnosis not present

## 2023-05-17 DIAGNOSIS — L409 Psoriasis, unspecified: Secondary | ICD-10-CM | POA: Insufficient documentation

## 2023-05-17 DIAGNOSIS — R001 Bradycardia, unspecified: Secondary | ICD-10-CM | POA: Diagnosis not present

## 2023-05-17 LAB — COMPREHENSIVE METABOLIC PANEL
ALT: 49 U/L — ABNORMAL HIGH (ref 0–44)
AST: 45 U/L — ABNORMAL HIGH (ref 15–41)
Albumin: 5.1 g/dL — ABNORMAL HIGH (ref 3.5–5.0)
Alkaline Phosphatase: 74 U/L (ref 38–126)
Anion gap: 13 (ref 5–15)
BUN: 28 mg/dL — ABNORMAL HIGH (ref 8–23)
CO2: 27 mmol/L (ref 22–32)
Calcium: 9.9 mg/dL (ref 8.9–10.3)
Chloride: 93 mmol/L — ABNORMAL LOW (ref 98–111)
Creatinine, Ser: 0.76 mg/dL (ref 0.61–1.24)
GFR, Estimated: >60 mL/min (ref >60)
Glucose, Bld: 98 mg/dL (ref 70–99)
Potassium: 2.9 mmol/L — ABNORMAL LOW (ref 3.5–5.1)
Sodium: 133 mmol/L — ABNORMAL LOW (ref 135–145)
Total Bilirubin: 2.3 mg/dL — ABNORMAL HIGH (ref 0.0–1.2)
Total Protein: 7.9 g/dL (ref 6.5–8.1)

## 2023-05-17 LAB — CBC WITH DIFFERENTIAL/PLATELET
Abs Immature Granulocytes: 0.03 10*3/uL (ref 0.00–0.07)
Basophils Absolute: 0.1 10*3/uL (ref 0.0–0.1)
Basophils Relative: 1 %
Eosinophils Absolute: 0.1 10*3/uL (ref 0.0–0.5)
Eosinophils Relative: 1 %
HCT: 46 % (ref 39.0–52.0)
Hemoglobin: 15.5 g/dL (ref 13.0–17.0)
Immature Granulocytes: 0 %
Lymphocytes Relative: 16 %
Lymphs Abs: 1.3 10*3/uL (ref 0.7–4.0)
MCH: 32.1 pg (ref 26.0–34.0)
MCHC: 33.7 g/dL (ref 30.0–36.0)
MCV: 95.2 fL (ref 80.0–100.0)
Monocytes Absolute: 0.8 10*3/uL (ref 0.1–1.0)
Monocytes Relative: 10 %
Neutro Abs: 5.9 10*3/uL (ref 1.7–7.7)
Neutrophils Relative %: 72 %
Platelets: 174 10*3/uL (ref 150–400)
RBC: 4.83 MIL/uL (ref 4.22–5.81)
RDW: 13.2 % (ref 11.5–15.5)
WBC: 8.2 10*3/uL (ref 4.0–10.5)
nRBC: 0 % (ref 0.0–0.2)

## 2023-05-17 LAB — BRAIN NATRIURETIC PEPTIDE: B Natriuretic Peptide: 60.4 pg/mL (ref 0.0–100.0)

## 2023-05-17 MED ORDER — POTASSIUM CHLORIDE CRYS ER 20 MEQ PO TBCR
40.0000 meq | EXTENDED_RELEASE_TABLET | Freq: Once | ORAL | Status: AC
  Administered 2023-05-17: 40 meq via ORAL
  Filled 2023-05-17: qty 2

## 2023-05-17 MED ORDER — POTASSIUM CHLORIDE CRYS ER 20 MEQ PO TBCR
20.0000 meq | EXTENDED_RELEASE_TABLET | Freq: Two times a day (BID) | ORAL | 3 refills | Status: DC
Start: 2023-05-17 — End: 2023-12-23

## 2023-05-17 NOTE — ED Provider Notes (Signed)
 [place] EMERGENCY DEPARTMENT AT [facility] Provider Note   CSN: [id number] Arrival date & time: 05/17/23  1204     History  Chief Complaint  Patient presents with   Leg Swelling    [name] is a 83 y.o. male with history of peripheral vascular disease, paroxysmal A-fib on Eliquis, also on Plavix, presented to ED with complaint of swelling in his left lower extremity noticed this morning.  He has chronic claudication issues and neuropathy in his feet but feels that the left leg was much more swollen today.  Does suffer from psoriasis as well.  HPI     Home Medications Prior to Admission medications   Medication Sig Start Date End Date Taking? Authorizing Provider  Alpha-Lipoic Acid 600 MG TABS Take 1,200 mg by mouth daily.    [provider]  atorvastatin (LIPITOR) 80 MG tablet TAKE 1 TABLET AT 6PM. 02/22/23   [name], MD  cholecalciferol (VITAMIN D) 1000 UNITS tablet Take 1,000 Units by mouth daily.    [provider]  clopidogrel (PLAVIX) 75 MG tablet TAKE ONE TAB DAILY WITH BREAKFAST 12/19/22   [name], MD  Coenzyme Q10 200 MG capsule Take 200 mg by mouth daily.     [provider]  Cyanocobalamin (VITAMIN B 12 PO) Take 200 mcg by mouth daily.    [provider]  ELIQUIS 5 MG TABS tablet TAKE 1 TABLET BY MOUTH TWICE DAILY. 10/26/22   [name], MD  FARXIGA 10 MG TABS tablet Take 1 tablet (10 mg total) by mouth daily. 03/18/23   [name], MD  Flaxseed, Linseed, 1000 MG CAPS Take 1,000 mg by mouth daily.    [provider]  furosemide (LASIX) 40 MG tablet Take 1 tablet (40 mg total) by mouth 2 (two) times daily. 09/07/22   [name], MD  Magnesium Citrate 200 MG TABS Take 400 mg by mouth daily in the afternoon.    [provider]  metolazone (ZAROXOLYN) 2.5 MG tablet Take 1 tablet by mouth on Monday, Wednesday, and Friday 04/22/23   [name], MD  Polyethyl  Glycol-Propyl Glycol (SYSTANE) 0.4-0.3 % GEL ophthalmic gel Place 1 application. into both eyes every 8 (eight) hours as needed (dry eyes).    [provider]  potassium chloride SA (KLOR-CON M) 20 MEQ tablet Take 1 tablet (20 mEq total) by mouth 2 (two) times daily. 05/17/23   Terald Sleeper, MD  pyridoxine (B-6) 200 MG tablet Take 200 mg by mouth daily.    [provider]  vitamin C (ASCORBIC ACID) 500 MG tablet Take 500 mg by mouth 3 (three) times daily.     [provider]  lisinopril (PRINIVIL,ZESTRIL) 20 MG tablet Take 1 tablet (20 mg total) by mouth daily. HOLD UNTIL YOUR APPT WITH DR Cecelia Byars Patient not taking: No sig reported 02/11/18 01/25/20  Abelino Derrick, PA-C      Allergies    Patient has no known allergies.    Review of Systems   Review of Systems  Physical Exam Updated Vital Signs BP 128/63   Pulse (!) 40   Temp 97.6 F (36.4 C) (Oral)   Resp 18   Ht 6\' 3"  (1.905 m)   Wt 92.8 kg   SpO2 99%   BMI 25.57 kg/m  Physical Exam Constitutional:      General: He is not in acute distress. HENT:     Head: Normocephalic and atraumatic.  Eyes:     Conjunctiva/sclera: Conjunctivae normal.     Pupils: Pupils are equal, round, and reactive to light.  Cardiovascular:     Rate and Rhythm: Normal rate and regular rhythm.  Pulmonary:     Effort: Pulmonary effort is normal. No respiratory distress.  Abdominal:     General: There is no distension.     Tenderness: There is no abdominal tenderness.  Skin:    General: Skin is warm and dry.     Comments: Psoriatic lesions of the lower extremities which are chronic, brawny edema of the symmetrical the lower extremities, mild increase edema of the left lower extremity compared to the right  Neurological:     General: No focal deficit present.     Mental Status: He is alert. Mental status is at baseline.  Psychiatric:        Mood and Affect: Mood normal.        Behavior: Behavior normal.     ED  Results / Procedures / Treatments   Labs (all labs ordered are listed, but only abnormal results are displayed) Labs Reviewed  COMPREHENSIVE METABOLIC PANEL - Abnormal; Notable for the following components:      Result Value   Sodium 133 (*)    Potassium 2.9 (*)    Chloride 93 (*)    BUN 28 (*)    Albumin 5.1 (*)    AST 45 (*)    ALT 49 (*)    Total Bilirubin 2.3 (*)    All other components within normal limits  CBC WITH DIFFERENTIAL/PLATELET  BRAIN NATRIURETIC PEPTIDE    EKG None  Radiology VAS [place] LOWER EXTREMITY VENOUS (DVT) (7a-7p) Result Date: 05/17/2023  Lower Venous DVT Study Patient Name:  [name]  Date of Exam:   05/17/2023 Medical Rec #: [id number]         Accession #:    [id number] Date of Birth: 1940-05-04        Patient Gender: M Patient Age:   20 years Exam Location:  [facility] Procedure:      VAS [place] LOWER EXTREMITY VENOUS (DVT) Referring Phys: Maryanna Shape --------------------------------------------------------------------------------  Indications: Swelling, and Edema.  Comparison Study: No prior exam. Performing Technologist: Fernande Bras  Examination Guidelines: A complete evaluation includes B-mode imaging, spectral Doppler, color Doppler, and power Doppler as needed of all accessible portions of each vessel. Bilateral testing is considered an integral part of a complete examination. Limited examinations for reoccurring indications may be performed as noted. The reflux portion of the exam is performed with the patient in reverse Trendelenburg.  +-----+---------------+---------+-----------+----------+--------------+ RIGHTCompressibilityPhasicitySpontaneityPropertiesThrombus Aging +-----+---------------+---------+-----------+----------+--------------+ CFV  Full           Yes      Yes                                 +-----+---------------+---------+-----------+----------+--------------+ SFJ  Full           Yes      Yes                                  +-----+---------------+---------+-----------+----------+--------------+   +---------+---------------+---------+-----------+----------+--------------+ LEFT     CompressibilityPhasicitySpontaneityPropertiesThrombus Aging +---------+---------------+---------+-----------+----------+--------------+ CFV      Full           Yes      Yes                                 +---------+---------------+---------+-----------+----------+--------------+  SFJ      Full           Yes      Yes                                 +---------+---------------+---------+-----------+----------+--------------+ FV Prox  Full                                                        +---------+---------------+---------+-----------+----------+--------------+ FV Mid   Full                                                        +---------+---------------+---------+-----------+----------+--------------+ FV DistalFull                                                        +---------+---------------+---------+-----------+----------+--------------+ PFV      Full                                                        +---------+---------------+---------+-----------+----------+--------------+ POP      Full           Yes      Yes                                 +---------+---------------+---------+-----------+----------+--------------+ PTV      Full                                                        +---------+---------------+---------+-----------+----------+--------------+ PERO     Full                                                        +---------+---------------+---------+-----------+----------+--------------+    Summary: RIGHT: - No evidence of common femoral vein obstruction.   LEFT: - There is no evidence of deep vein thrombosis in the lower extremity.  - No cystic structure found in the popliteal fossa.  *See table(s) above for measurements and observations.  Electronically signed by Coral Else MD on 05/17/2023 at 8:40:23 PM.    Final     Procedures Procedures    Medications Ordered in ED Medications  potassium chloride SA (KLOR-CON M) CR tablet 40 mEq (40 mEq Oral Given 05/17/23 1350)    ED Course/ Medical Decision Making/ A&P Clinical Course as of 05/18/23 1209  Fri May 17, 2023  1456 DVT study negative  [  MT]    Clinical Course User Index [MT] Shadrack Brummitt, Kermit Balo, MD                                 Medical Decision Making Risk Prescription drug management.   Patient is here with concern for worsening swelling of the left lower leg, atraumatic.  Differential would include cellulitis or skin infection versus DVT versus other  He has symmetrical cap refills, poor pedal pulse which is chronic, bilaterally, but faintly present.  Do not suspect this is acute ischemia.  Appears reviewed interpreted patient's labs and imaging.  There is no acute DVT.  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  We will need to increase this to 20 mg twice daily for now and he was given additional supplements in the ED.  He can follow-up with his PCP for this issue.  Blood pressure has been a sinus bradycardia but regular throughout his stay in the ED, which is also not a new or acute problem.  I also agree he could follow-up as an outpatient for this issue with his cardiologist.  He is stable for discharge at this time.        Final Clinical Impression(s) / ED Diagnoses Final diagnoses:  Edema, unspecified type  Hypokalemia  Sinus bradycardia    Rx / DC Orders ED Discharge Orders          Ordered    potassium chloride SA (KLOR-CON M) 20 MEQ tablet  2 times daily        05/17/23 1503              Terald Sleeper, MD 05/18/23 1209

## 2023-05-17 NOTE — Progress Notes (Signed)
 [name], [name] are scheduled for a virtual visit with your provider today.    Just as we do with appointments in the office, we must obtain your consent to participate.  Your consent will be active for this visit and any virtual visit you may have with one of our providers in the next 365 days.    If you have a MyChart account, I can also send a copy of this consent to you electronically.  All virtual visits are billed to your insurance company just like a traditional visit in the office.  As this is a virtual visit, video technology does not allow for your provider to perform a traditional examination.  This may limit your provider's ability to fully assess your condition.  If your provider identifies any concerns that need to be evaluated in person or the need to arrange testing such as labs, EKG, etc, we will make arrangements to do so.    Although advances in technology are sophisticated, we cannot ensure that it will always work on either your end or our end.  If the connection with a video visit is poor, we may have to switch to a telephone visit.  With either a video or telephone visit, we are not always able to ensure that we have a secure connection.   I need to obtain your verbal consent now.   Are you willing to proceed with your visit today?   [name] has provided verbal consent on 05/17/2023 for a virtual visit (video or telephone).   Karrie Meres, [place] 05/17/2023  11:26 AM   Date:  05/17/2023   ID:  [name] Feb 19, 1941, MRN [id number]  Patient Location: Home Provider Location: Home Office   Participants: Patient and Provider for Visit and Wrap up  Method of visit: Video  Location of Patient: Home Location of Provider: Home Office Consent was obtain for visit over the video. Services rendered by provider: Visit was performed via video  A video enabled telemedicine application was used and I verified that I am speaking with the correct person using two  identifiers.  PCP:  Thana Ates, MD   Chief Complaint:  leg swelling   History of Present Illness:    [name] is a 83 y.o. male with history as stated below. Presents video telehealth for an acute care visit  Pt reports that the left leg started swelling overnight. States he has pain to the left leg as well. 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 (12/21/21). Reports no color changes to the left leg. Pt is anticoagulated.  Tried calling his cardiologist but has not received callback.   Past Medical, Surgical, Social History, Allergies, and Medications have been Reviewed.  Past Medical History:  Diagnosis Date   A-fib (HCC)    Allergy    Seasonal--pollen   Arrhythmia    CAD (coronary artery disease) 1999   RCA stent   Carotid stenosis, asymptomatic    moderate RT and mild LT with carotid dopplers 10/26/11   Colon cancer (HCC) 04/2006   Stage III--T3 N1   COPD (chronic obstructive pulmonary disease) (HCC)    H/O cardiovascular stress test 01/13/2010   low risk scan, similar to 2008 study   H/O echocardiogram 05/23/2009   EF >55%, mild MR, Mild TR,    Hand foot syndrome    Hemorrhoids    Hyperlipidemia    Hypertension    Neuropathy  OSA treated with BiPAP    severe OSA with an AHI fo 45/hr and O2 sats as low as 85%. On auto BiPAP   Permanent atrial fibrillation (HCC)    Psoriasis    Tubular adenoma of colon     No outpatient medications have been marked as taking for the 05/17/23 encounter (Video Visit) with [facility] PROVIDER.     Allergies:   Patient has no known allergies.   ROS See HPI for history of present illness.  Physical Exam Musculoskeletal:     Comments: Chronic venous stasis changes to the ble. Unilateral leg swelling on the left.               MDM:  Pt with hx PAD and iliac artery occlusion s/p stenting presenting for c/o left leg pain and swelling. Advised that pt needs an in person  exam and may require imaging to r/o recurrent arterial occlusion vs other etiologies. Pt states he will go to [facility].   Tests Ordered: No orders of the defined types were placed in this encounter.   Medication Changes: No orders of the defined types were placed in this encounter.    Disposition:  Follow up  Signed, Karrie Meres, PA-C  05/17/2023 11:26 AM

## 2023-05-17 NOTE — Discharge Instructions (Addendum)
 Your serum level was low when you are increasing your potassium dosing from once a day to twice a day.  Try to take this about 12 hours apart.  I sent a new prescription to your pharmacy.

## 2023-05-17 NOTE — ED Provider Triage Note (Signed)
 Emergency Medicine Provider Triage Evaluation Note  [name] , a 83 y.o. male  was evaluated in triage.  Pt complains of leg swelling.  Patient has history of PAD, CHF presents ED with concerns of left lower leg swelling.  Ongoing for several days without significant improvement.  He reports he has been taking his medication as prescribed.  He is on a blood thinner.  Denies any significant changes regarding the coloration or the pain in his foot from his PAD.  Has classic claudication type symptoms with activity when he tries to walk around. He is acutely worsening low and causing more difficulty with ambulation.  Review of Systems  Positive: As above Negative: As above  Physical Exam  BP (!) 158/62   Pulse (!) 55   Temp 97.6 F (36.4 C) (Oral)   Resp 18   Ht 6\' 3"  (1.905 m)   Wt 92.8 kg   SpO2 97%   BMI 25.57 kg/m  Gen:   Awake, no distress   Resp:  Normal effort  MSK:   Moves extremities without difficulty  Other:  Erythematous legs with left leg slightly more swollen than right, minimal edema. PT and DP pulses faint.  Medical Decision Making  Medically screening exam initiated at 12:41 PM.  Appropriate orders placed.  INIKO ROBLES was informed that the remainder of the evaluation will be completed by another provider, this initial triage assessment does not replace that evaluation, and the importance of remaining in the ED until their evaluation is complete.     Smitty Knudsen, PA-C 05/17/23 1242

## 2023-05-17 NOTE — ED Triage Notes (Signed)
 Patient is here for evaluation of swelling to the left leg. Reports history of PAD. States he has a generalized discomfort to the foot and ankle but no pain or change in skin color. Patient able to ambulate but states it is more difficult than normally.

## 2023-05-17 NOTE — Patient Instructions (Signed)
 [name], thank you for joining Karrie Meres, PA-C for today's virtual visit.  While this provider is not your primary care provider (PCP), if your PCP is located in our provider database this encounter information will be shared with them immediately following your visit.   A [facility] MyChart account gives you access to today's visit and all your visits, tests, and labs performed at [facility] " click here if you don't have a [facility] MyChart account or go to [email or web address]  Consent: (Patient) [name] provided verbal consent for this virtual visit at the beginning of the encounter.  Current Medications:  Current Outpatient Medications:    Alpha-Lipoic Acid 600 MG TABS, Take 1,200 mg by mouth daily., Disp: , Rfl:    atorvastatin (LIPITOR) 80 MG tablet, TAKE 1 TABLET AT 6PM., Disp: 90 tablet, Rfl: 1   cholecalciferol (VITAMIN D) 1000 UNITS tablet, Take 1,000 Units by mouth daily., Disp: , Rfl:    clopidogrel (PLAVIX) 75 MG tablet, TAKE ONE TAB DAILY WITH BREAKFAST, Disp: 90 tablet, Rfl: 2   Coenzyme Q10 200 MG capsule, Take 200 mg by mouth daily. , Disp: , Rfl:    Cyanocobalamin (VITAMIN B 12 PO), Take 200 mcg by mouth daily., Disp: , Rfl:    ELIQUIS 5 MG TABS tablet, TAKE 1 TABLET BY MOUTH TWICE DAILY., Disp: 180 tablet, Rfl: 1   FARXIGA 10 MG TABS tablet, Take 1 tablet (10 mg total) by mouth daily., Disp: 30 tablet, Rfl: 5   Flaxseed, Linseed, 1000 MG CAPS, Take 1,000 mg by mouth daily., Disp: , Rfl:    furosemide (LASIX) 40 MG tablet, Take 1 tablet (40 mg total) by mouth 2 (two) times daily., Disp: 60 tablet, Rfl: 2   Magnesium Citrate 200 MG TABS, Take 400 mg by mouth daily in the afternoon., Disp: , Rfl:    metolazone (ZAROXOLYN) 2.5 MG tablet, Take 1 tablet by mouth on Monday, Wednesday, and Friday, Disp: 30 tablet, Rfl: 11   Polyethyl Glycol-Propyl Glycol (SYSTANE) 0.4-0.3 % GEL ophthalmic gel, Place 1 application. into both eyes every  8 (eight) hours as needed (dry eyes)., Disp: , Rfl:    potassium chloride SA (KLOR-CON M) 20 MEQ tablet, Take 1 tablet (20 mEq total) by mouth daily., Disp: 90 tablet, Rfl: 3   pyridoxine (B-6) 200 MG tablet, Take 200 mg by mouth daily., Disp: , Rfl:    vitamin C (ASCORBIC ACID) 500 MG tablet, Take 500 mg by mouth 3 (three) times daily. , Disp: , Rfl:    Medications ordered in this encounter:  No orders of the defined types were placed in this encounter.    *If you need refills on other medications prior to your next appointment, please contact your pharmacy*  Follow-Up: Call back or seek an in-person evaluation if the symptoms worsen or if the condition fails to improve as anticipated.  [facility] [phone]  Other Instructions Please go to the nearest emergency room for further evaluation.    If you have been instructed to have an in-person evaluation today at a local Urgent Care facility, please use the link below. It will take you to a list of all of our available [facility], including address, phone number and hours of operation. Please do not delay care.   Urgent Cares  If you or a family member do not have a primary care provider, use the link below to schedule a visit and establish care.  When you choose a [facility] primary care physician or advanced practice provider, you gain a long-term partner in health. Find a Primary Care Provider  Learn more about [facility]'s in-office and virtual care options: [facility] [facility] - Get Care Now

## 2023-05-24 ENCOUNTER — Other Ambulatory Visit: Payer: Self-pay | Admitting: Cardiovascular Disease

## 2023-05-24 DIAGNOSIS — I4821 Permanent atrial fibrillation: Secondary | ICD-10-CM

## 2023-05-24 NOTE — Telephone Encounter (Signed)
 Prescription refill request for Eliquis received. Indication:afib Last office visit:1/25 Scr:0.76  2/25 Age: 83 Weight:92.8  kg  Prescription refilled

## 2023-05-27 ENCOUNTER — Telehealth: Payer: Self-pay | Admitting: Cardiovascular Disease

## 2023-05-27 NOTE — Telephone Encounter (Signed)
 Left message for pt to call.

## 2023-05-27 NOTE — Telephone Encounter (Signed)
 Pt c/o swelling: STAT is pt has developed SOB within 24 hours  How much weight have you gained and in what time span? About 2 lbs and was at [facility] 2/28 and they called it inconclusive edema and since it hasn't improved he figure Dr. Allyson Sabal would want to know   If swelling, where is the swelling located? Left leg and foot   Are you currently taking a fluid pill? Yes  Are you currently SOB? No  Do you have a log of your daily weights (if so, list)? No  Have you gained 3 pounds in a day or 5 pounds in a week? Yes  Have you traveled recently? No

## 2023-05-28 NOTE — Telephone Encounter (Signed)
 Pt returning call

## 2023-05-28 NOTE — Telephone Encounter (Signed)
 Attempted to call patient x2, no answer left message requesting a call back.

## 2023-05-29 ENCOUNTER — Telehealth (HOSPITAL_COMMUNITY): Payer: Self-pay | Admitting: Cardiology

## 2023-05-29 NOTE — Telephone Encounter (Signed)
 2nd attempt to call patient, no answer left message requesting a call back.

## 2023-05-29 NOTE — Telephone Encounter (Signed)
 Patient left V on triage line with concerns of edema, weakness, and increased SOB  Returned call pt is very concerned with swelling and SOB  Reports swelling is in RLE>LLE Requests an appt with provider  Add on with [facility] 3/13 @ 3

## 2023-05-30 ENCOUNTER — Ambulatory Visit (HOSPITAL_COMMUNITY)
Admission: RE | Admit: 2023-05-30 | Discharge: 2023-05-30 | Disposition: A | Discharge status: Home / Self Care | Source: Ambulatory Visit | Attending: Cardiology | Admitting: Cardiology

## 2023-05-30 ENCOUNTER — Encounter (HOSPITAL_COMMUNITY): Payer: Self-pay | Admitting: Cardiology

## 2023-05-30 VITALS — BP 120/70 | HR 55 | Wt 204.4 lb

## 2023-05-30 DIAGNOSIS — Z79899 Other long term (current) drug therapy: Secondary | ICD-10-CM | POA: Insufficient documentation

## 2023-05-30 DIAGNOSIS — I482 Chronic atrial fibrillation, unspecified: Secondary | ICD-10-CM | POA: Insufficient documentation

## 2023-05-30 DIAGNOSIS — Z7982 Long term (current) use of aspirin: Secondary | ICD-10-CM | POA: Diagnosis not present

## 2023-05-30 DIAGNOSIS — I2510 Atherosclerotic heart disease of native coronary artery without angina pectoris: Secondary | ICD-10-CM | POA: Insufficient documentation

## 2023-05-30 DIAGNOSIS — I739 Peripheral vascular disease, unspecified: Secondary | ICD-10-CM | POA: Insufficient documentation

## 2023-05-30 DIAGNOSIS — I5032 Chronic diastolic (congestive) heart failure: Secondary | ICD-10-CM | POA: Diagnosis not present

## 2023-05-30 DIAGNOSIS — Z955 Presence of coronary angioplasty implant and graft: Secondary | ICD-10-CM | POA: Diagnosis not present

## 2023-05-30 DIAGNOSIS — Z951 Presence of aortocoronary bypass graft: Secondary | ICD-10-CM | POA: Diagnosis not present

## 2023-05-30 DIAGNOSIS — J449 Chronic obstructive pulmonary disease, unspecified: Secondary | ICD-10-CM | POA: Insufficient documentation

## 2023-05-30 DIAGNOSIS — I272 Pulmonary hypertension, unspecified: Secondary | ICD-10-CM | POA: Diagnosis not present

## 2023-05-30 DIAGNOSIS — I110 Hypertensive heart disease with heart failure: Secondary | ICD-10-CM | POA: Diagnosis not present

## 2023-05-30 MED ORDER — SPIRONOLACTONE 25 MG PO TABS: 25.0000 mg | ORAL_TABLET | Freq: Every day | ORAL | 3 refills | Status: AC

## 2023-05-30 NOTE — Telephone Encounter (Signed)
 Called patient. Left message to return call and left number to call in message.   Josie LPN

## 2023-05-30 NOTE — Patient Instructions (Signed)
 START Spironolactone 25 mg  CHANGE Lasix to 80 mg in the morning and 40 mg in the evening for 3 days, then go back to your regular dose of 40 mg Twice daily  Blood work in 2 weeks at [facility]  PLEASE follow up with Dr. Allyson Sabal as scheduled.

## 2023-05-30 NOTE — Progress Notes (Signed)
   ADVANCED HEART FAILURE FOLLOW UP CLINIC NOTE  Referring Physician: Thana Ates, MD  Primary Care: Thana Ates, MD Primary Cardiologist:  HPI: [name] is a 83 y.o. male with a PMH of with HTN, chronic AF, CAD s/p RCA stenting in 1999 and CABG 11/19, PAD, colonCA s/p right colectomy 3/08 followed by 11 cycles of adjuvant Folfox chemotherapy. who presents for follow up of chronic diastolic heart faliure.    SUBJECTIVE:  Patient reports that over the past week or so he has had worsening swelling of the left lower extremity as well as some associated shortness of breath. He reports being short of breath moving his trash cans out of his house the other day. He went to the emergency department who evaluated him for lower extremity thrombus which was negative. He reports that the edema is worse in the evening and improves after his legs are elevated overnight.   PMH, current medications, allergies, social history, and family history reviewed in epic.  PHYSICAL EXAM: Vitals:   05/30/23 1512  BP: 120/70  Pulse: (!) 55  SpO2: 96%   GENERAL: Well nourished and in no apparent distress at rest.  PULM:  Normal work of breathing, clear to auscultation bilaterally. Respirations are unlabored.  CARDIAC:  JVP: mildly elevated         Normal rate with regular rhythm. No murmurs, rubs or gallops.  Chronic venous stasis edema with significant rubor/rash, no warmth, mild increase in edema in the left lower extremity. Warm and well perfused extremities. ABDOMEN: Soft, non-tender, non-distended. NEUROLOGIC: Patient is oriented x3 with no focal or lateralizing neurologic deficits.    DATA REVIEW  ECHO: Echo 10/22 LVEF >65% RV dilated with normal function. Severe central TR RVSP (previously 38)    CATH: RA = 12 (v- waves to 23) PA = 43/11 (26) PCW = 14 (v- waves up to 35) Fick cardiac output/index = 6.6/2.9 PVR = 1.8 WU PAPi = 2.7    PFTs 3/23: FEV1 1.54 (42%) FVC 3.23 (64)  FEV1/FVC 48% DLCO 46%   cMRI 11/23 1.  Severe RAE, moderate LAE 2.  Mild LVH normal LVEF 71% with no RWMAls 3. Normal post gadolinium images no evidence of amyloid or infiltrative disease 4.  Normal RV size and function RVEF 55%  ReDs reading: 26 %, normal    ASSESSMENT & PLAN:  Chronic diastolic heart failure: Likely moderate restrictive disease versus poor atrial compliance given v waves on RHC. Mildly volume overloaded today, though right side predominant given normal ReDs.  - Increase lasix to 80/40 for three days then return to normal 40/40 - Start spironolactone 25mg  daily - Labs in 2 weeks - Continue metolazone MWF - Follow up with Dr. 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 On CPAP, remainder of workup negative.  - Management of HFpEF as above  CAD  - s/p CABG 2019  - No s/s angina - Continue ASA/statin - Managed by Dr. Allyson Sabal   COPD - Consider pulmonary f/u   PAD - s/p bilateral iliac stenting 10/23 - Claudication improving, has mild symptoms  Follow up prn  Clearnce Hasten, MD Advanced Heart Failure Mechanical Circulatory Support 05/30/23

## 2023-05-31 NOTE — Telephone Encounter (Signed)
 Multiple outreach attempts no answer, left message requesting a call back Nursing will await for patient to return call

## 2023-06-19 ENCOUNTER — Other Ambulatory Visit: Payer: Self-pay

## 2023-06-19 ENCOUNTER — Ambulatory Visit (HOSPITAL_BASED_OUTPATIENT_CLINIC_OR_DEPARTMENT_OTHER)
Admission: RE | Admit: 2023-06-19 | Discharge: 2023-06-19 | Disposition: A | Discharge status: Home / Self Care | Payer: Medicare Other | Source: Ambulatory Visit | Attending: Cardiovascular Disease | Admitting: Cardiovascular Disease

## 2023-06-19 ENCOUNTER — Ambulatory Visit (HOSPITAL_COMMUNITY)
Admission: RE | Admit: 2023-06-19 | Discharge: 2023-06-19 | Disposition: A | Discharge status: Home / Self Care | Payer: Medicare Other | Source: Ambulatory Visit | Attending: Cardiovascular Disease | Admitting: Cardiovascular Disease

## 2023-06-19 DIAGNOSIS — I6523 Occlusion and stenosis of bilateral carotid arteries: Secondary | ICD-10-CM | POA: Diagnosis not present

## 2023-06-19 DIAGNOSIS — I5032 Chronic diastolic (congestive) heart failure: Secondary | ICD-10-CM

## 2023-06-19 DIAGNOSIS — I739 Peripheral vascular disease, unspecified: Secondary | ICD-10-CM

## 2023-06-19 DIAGNOSIS — I10 Essential (primary) hypertension: Secondary | ICD-10-CM | POA: Insufficient documentation

## 2023-06-19 DIAGNOSIS — Z95820 Peripheral vascular angioplasty status with implants and grafts: Secondary | ICD-10-CM | POA: Insufficient documentation

## 2023-06-19 LAB — VAS US ABI WITH/WO TBI

## 2023-06-20 LAB — BASIC METABOLIC PANEL WITH GFR
BUN/Creatinine Ratio: 24 (ref 10–24)
BUN: 27 mg/dL (ref 8–27)
CO2: 29 mmol/L (ref 20–29)
Calcium: 9.8 mg/dL (ref 8.6–10.2)
Chloride: 94 mmol/L — ABNORMAL LOW (ref 96–106)
Creatinine, Ser: 1.12 mg/dL (ref 0.76–1.27)
Glucose: 111 mg/dL — ABNORMAL HIGH (ref 70–99)
Potassium: 4.7 mmol/L (ref 3.5–5.2)
Sodium: 136 mmol/L (ref 134–144)
eGFR: 66 mL/min/{1.73_m2} (ref >59)

## 2023-06-20 LAB — BRAIN NATRIURETIC PEPTIDE: BNP: 71.4 pg/mL (ref 0.0–100.0)

## 2023-06-24 DIAGNOSIS — M67432 Ganglion, left wrist: Secondary | ICD-10-CM | POA: Diagnosis not present

## 2023-06-25 ENCOUNTER — Telehealth: Payer: Self-pay | Admitting: Cardiology

## 2023-06-25 ENCOUNTER — Encounter (HOSPITAL_COMMUNITY): Payer: Self-pay | Admitting: Cardiology

## 2023-06-25 NOTE — Telephone Encounter (Signed)
 They need sleep study faxed to them. They received everything but that. Send to [phone]

## 2023-06-26 NOTE — Telephone Encounter (Signed)
 Sleep Study faxed to [facility] today.

## 2023-07-03 ENCOUNTER — Emergency Department (HOSPITAL_COMMUNITY)
Admission: EM | Admit: 2023-07-03 | Discharge: 2023-07-03 | Disposition: A | Discharge status: Home / Self Care | Attending: Emergency Medicine | Admitting: Emergency Medicine

## 2023-07-03 ENCOUNTER — Other Ambulatory Visit: Payer: Self-pay

## 2023-07-03 ENCOUNTER — Emergency Department (HOSPITAL_COMMUNITY)

## 2023-07-03 ENCOUNTER — Encounter (HOSPITAL_COMMUNITY): Payer: Self-pay

## 2023-07-03 DIAGNOSIS — Z7901 Long term (current) use of anticoagulants: Secondary | ICD-10-CM | POA: Insufficient documentation

## 2023-07-03 DIAGNOSIS — I517 Cardiomegaly: Secondary | ICD-10-CM | POA: Diagnosis not present

## 2023-07-03 DIAGNOSIS — I771 Stricture of artery: Secondary | ICD-10-CM | POA: Diagnosis not present

## 2023-07-03 DIAGNOSIS — J449 Chronic obstructive pulmonary disease, unspecified: Secondary | ICD-10-CM | POA: Diagnosis not present

## 2023-07-03 DIAGNOSIS — I6782 Cerebral ischemia: Secondary | ICD-10-CM | POA: Diagnosis not present

## 2023-07-03 DIAGNOSIS — Z955 Presence of coronary angioplasty implant and graft: Secondary | ICD-10-CM | POA: Insufficient documentation

## 2023-07-03 DIAGNOSIS — I700 Atherosclerosis of aorta: Secondary | ICD-10-CM | POA: Diagnosis not present

## 2023-07-03 DIAGNOSIS — I509 Heart failure, unspecified: Secondary | ICD-10-CM | POA: Diagnosis not present

## 2023-07-03 DIAGNOSIS — R55 Syncope and collapse: Secondary | ICD-10-CM | POA: Insufficient documentation

## 2023-07-03 DIAGNOSIS — R42 Dizziness and giddiness: Secondary | ICD-10-CM | POA: Insufficient documentation

## 2023-07-03 LAB — URINALYSIS, ROUTINE W REFLEX MICROSCOPIC
Bacteria, UA: NONE SEEN
Bilirubin Urine: NEGATIVE
Glucose, UA: >=500 mg/dL — AB
Hgb urine dipstick: NEGATIVE
Ketones, ur: NEGATIVE mg/dL
Leukocytes,Ua: NEGATIVE
Nitrite: NEGATIVE
Protein, ur: NEGATIVE mg/dL
Specific Gravity, Urine: 1.009 (ref 1.005–1.030)
pH: 7 (ref 5.0–8.0)

## 2023-07-03 LAB — CBC WITH DIFFERENTIAL/PLATELET
Abs Immature Granulocytes: 0.03 10*3/uL (ref 0.00–0.07)
Basophils Absolute: 0.1 10*3/uL (ref 0.0–0.1)
Basophils Relative: 1 %
Eosinophils Absolute: 0.1 10*3/uL (ref 0.0–0.5)
Eosinophils Relative: 1 %
HCT: 44.8 % (ref 39.0–52.0)
Hemoglobin: 15 g/dL (ref 13.0–17.0)
Immature Granulocytes: 0 %
Lymphocytes Relative: 17 %
Lymphs Abs: 1.3 10*3/uL (ref 0.7–4.0)
MCH: 31.6 pg (ref 26.0–34.0)
MCHC: 33.5 g/dL (ref 30.0–36.0)
MCV: 94.3 fL (ref 80.0–100.0)
Monocytes Absolute: 0.5 10*3/uL (ref 0.1–1.0)
Monocytes Relative: 7 %
Neutro Abs: 5.9 10*3/uL (ref 1.7–7.7)
Neutrophils Relative %: 74 %
Platelets: 163 10*3/uL (ref 150–400)
RBC: 4.75 MIL/uL (ref 4.22–5.81)
RDW: 13.5 % (ref 11.5–15.5)
WBC: 7.9 10*3/uL (ref 4.0–10.5)
nRBC: 0 % (ref 0.0–0.2)

## 2023-07-03 LAB — COMPREHENSIVE METABOLIC PANEL WITH GFR
ALT: 40 U/L (ref 0–44)
AST: 41 U/L (ref 15–41)
Albumin: 4.2 g/dL (ref 3.5–5.0)
Alkaline Phosphatase: 59 U/L (ref 38–126)
Anion gap: 14 (ref 5–15)
BUN: 28 mg/dL — ABNORMAL HIGH (ref 8–23)
CO2: 25 mmol/L (ref 22–32)
Calcium: 9.6 mg/dL (ref 8.9–10.3)
Chloride: 93 mmol/L — ABNORMAL LOW (ref 98–111)
Creatinine, Ser: 0.83 mg/dL (ref 0.61–1.24)
GFR, Estimated: >60 mL/min (ref >60)
Glucose, Bld: 120 mg/dL — ABNORMAL HIGH (ref 70–99)
Potassium: 3.8 mmol/L (ref 3.5–5.1)
Sodium: 132 mmol/L — ABNORMAL LOW (ref 135–145)
Total Bilirubin: 1.9 mg/dL — ABNORMAL HIGH (ref 0.0–1.2)
Total Protein: 6.9 g/dL (ref 6.5–8.1)

## 2023-07-03 LAB — TROPONIN I (HIGH SENSITIVITY)
Troponin I (High Sensitivity): 18 ng/L — ABNORMAL HIGH (ref <18)
Troponin I (High Sensitivity): 20 ng/L — ABNORMAL HIGH (ref <18)

## 2023-07-03 LAB — CBG MONITORING, ED: Glucose-Capillary: 152 mg/dL — ABNORMAL HIGH (ref 70–99)

## 2023-07-03 NOTE — Discharge Instructions (Addendum)
 Today you were seen for a near syncopal episode.  Please see the attached handout and be sure to maintain adequate hydration.  Please return to the ED if your symptoms return for further evaluation and treatment.  Thank you for letting us  treat you today. After reviewing your labs and imaging, I feel you are safe to go home. Please follow up with your PCP in the next several days and provide them with your records from this visit. Return to the Emergency Room if pain becomes severe or symptoms worsen.

## 2023-07-03 NOTE — ED Provider Notes (Signed)
 [place] EMERGENCY DEPARTMENT AT [facility] Provider Note   CSN: [id number] Arrival date & time: 07/03/23  1407     History  Chief Complaint  Patient presents with   Dizziness    REVAN GENDRON is a 83 y.o. male.  Past medical history significant for A-fib, NSTEMI, CABG x 4, near syncope, hyponatremia, CHF presents today for an episode of dizziness.  Patient is concerned he may have had a "mini stroke".  Patient states that the episode lasted approximately 2 seconds and he had no unilateral weakness or numbness.  Patient denies shortness of breath, numbness, fever, weakness, confusion, vision changes, nausea, vomiting, chest pain, any other complaints at this time.  Patient is anticoagulated on Plavix and Eliquis.   Dizziness      Home Medications Prior to Admission medications   Medication Sig Start Date End Date Taking? Authorizing Provider  Alpha-Lipoic Acid 600 MG TABS Take 1,200 mg by mouth daily.    [provider]  atorvastatin (LIPITOR) 80 MG tablet TAKE 1 TABLET AT 6PM. 02/22/23   [name], MD  cholecalciferol (VITAMIN D) 1000 UNITS tablet Take 1,000 Units by mouth daily.    [provider]  clopidogrel (PLAVIX) 75 MG tablet TAKE ONE TAB DAILY WITH BREAKFAST 12/19/22   [name], MD  Coenzyme Q10 200 MG capsule Take 200 mg by mouth daily.     [provider]  Cyanocobalamin (VITAMIN B 12 PO) Take 200 mcg by mouth daily.    [provider]  ELIQUIS 5 MG TABS tablet TAKE 1 TABLET BY MOUTH TWICE DAILY. 05/24/23   [name], MD  FARXIGA 10 MG TABS tablet Take 1 tablet (10 mg total) by mouth daily. 03/18/23   [name], MD  Flaxseed, Linseed, 1000 MG CAPS Take 1,000 mg by mouth daily.    [provider]  furosemide (LASIX) 40 MG tablet Take 1 tablet (40 mg total) by mouth 2 (two) times daily. 09/07/22   [name], MD  Magnesium Citrate 200 MG TABS Take 400 mg by mouth daily in the  afternoon.    [provider]  metolazone (ZAROXOLYN) 2.5 MG tablet Take 1 tablet by mouth on Monday, Wednesday, and Friday 04/22/23   [name], MD  Polyethyl Glycol-Propyl Glycol (SYSTANE) 0.4-0.3 % GEL ophthalmic gel Place 1 application. into both eyes every 8 (eight) hours as needed (dry eyes).    [provider]  potassium chloride SA (KLOR-CON M) 20 MEQ tablet Take 1 tablet (20 mEq total) by mouth 2 (two) times daily. 05/17/23   Terald Sleeper, MD  pyridoxine (B-6) 200 MG tablet Take 200 mg by mouth daily.    [provider]  spironolactone (ALDACTONE) 25 MG tablet Take 1 tablet (25 mg total) by mouth daily. 05/30/23 08/28/23  Romie Minus, MD  vitamin C (ASCORBIC ACID) 500 MG tablet Take 500 mg by mouth 3 (three) times daily.     [provider]  Zinc 50 MG TABS Take 1 tablet by mouth daily.    [provider]  lisinopril (PRINIVIL,ZESTRIL) 20 MG tablet Take 1 tablet (20 mg total) by mouth daily. HOLD UNTIL YOUR APPT WITH DR Cecelia Byars Patient not taking: No sig reported 02/11/18 01/25/20  Abelino Derrick, PA-C      Allergies    Patient has no known allergies.    Review of Systems   Review of Systems  Neurological:  Positive for dizziness.  Physical Exam Updated Vital Signs BP (!) 159/74   Pulse (!) 41   Temp 98 F (36.7 C) (Oral)   Resp (!) 21   Ht 6\' 3"  (1.905 m)   Wt 92.7 kg   SpO2 100%   BMI 25.54 kg/m  Physical Exam Vitals and nursing note reviewed.  Constitutional:      General: He is not in acute distress.    Appearance: Normal appearance. He is well-developed. He is not ill-appearing or diaphoretic.  HENT:     Head: Normocephalic and atraumatic.     Right Ear: External ear normal.     Left Ear: External ear normal.     Nose: Nose normal.     Mouth/Throat:     Mouth: Mucous membranes are moist.  Eyes:     Conjunctiva/sclera: Conjunctivae normal.  Cardiovascular:     Rate and Rhythm: Normal rate and  regular rhythm.     Pulses: Normal pulses.     Heart sounds: No murmur heard. Pulmonary:     Effort: Pulmonary effort is normal. No respiratory distress.     Breath sounds: Normal breath sounds.  Abdominal:     Palpations: Abdomen is soft.     Tenderness: There is no abdominal tenderness.  Musculoskeletal:        General: No swelling.     Cervical back: Normal range of motion and neck supple.  Skin:    General: Skin is warm and dry.     Capillary Refill: Capillary refill takes less than 2 seconds.  Neurological:     General: No focal deficit present.     Mental Status: He is alert and oriented to person, place, and time.     Cranial Nerves: No cranial nerve deficit.     Sensory: No sensory deficit.     Motor: No weakness.     Coordination: Coordination normal.     Gait: Gait normal.     Comments: Equal bilateral grip strength  Psychiatric:        Mood and Affect: Mood normal.     ED Results / Procedures / Treatments   Labs (all labs ordered are listed, but only abnormal results are displayed) Labs Reviewed  COMPREHENSIVE METABOLIC PANEL WITH GFR - Abnormal; Notable for the following components:      Result Value   Sodium 132 (*)    Chloride 93 (*)    Glucose, Bld 120 (*)    BUN 28 (*)    Total Bilirubin 1.9 (*)    All other components within normal limits  URINALYSIS, ROUTINE W REFLEX MICROSCOPIC - Abnormal; Notable for the following components:   Color, Urine STRAW (*)    Glucose, UA >=500 (*)    All other components within normal limits  CBG MONITORING, ED - Abnormal; Notable for the following components:   Glucose-Capillary 152 (*)    All other components within normal limits  TROPONIN I (HIGH SENSITIVITY) - Abnormal; Notable for the following components:   Troponin I (High Sensitivity) 18 (*)    All other components within normal limits  TROPONIN I (HIGH SENSITIVITY) - Abnormal; Notable for the following components:   Troponin I (High Sensitivity) 20 (*)     All other components within normal limits  CBC WITH DIFFERENTIAL/PLATELET    EKG EKG Interpretation Date/Time:  Wednesday July 03 2023 14:14:55 EDT Ventricular Rate:  55 PR Interval:    QRS Duration:  137 QT Interval:  430 QTC Calculation: 412 R Axis:   -  69  Text Interpretation: Atrial fibrillation RBBB and LAFB Abnormal T, consider ischemia, lateral leads No significant change since last tracing Confirmed by Zackowski, Scott [phone]) on 07/03/2023 5:44:57 PM  Radiology DG Chest 2 View Result Date: 07/03/2023 CLINICAL DATA:  near syncope EXAM: CHEST - 2 VIEW COMPARISON:  August 26, 2022 FINDINGS: No focal airspace consolidation, pleural effusion, or pneumothorax. Mild cardiomegaly. Left atrial appendage occlusion clip. Sternotomy wires. Tortuous aorta with aortic atherosclerosis. No acute fracture or destructive lesions. Osteopenia. Multilevel degenerative disc disease of the spine. IMPRESSION: Cardiomegaly.  Otherwise, no acute cardiopulmonary abnormality. Electronically Signed   By: Rance Burrows M.D.   On: 07/03/2023 19:19   CT Head Wo Contrast Result Date: 07/03/2023 CLINICAL DATA:  Syncope, presyncope, dizziness. EXAM: CT HEAD WITHOUT CONTRAST TECHNIQUE: Contiguous axial images were obtained from the base of the skull through the vertex without intravenous contrast. RADIATION DOSE REDUCTION: This exam was performed according to the departmental dose-optimization program which includes automated exposure control, adjustment of the mA and/or kV according to patient size and/or use of iterative reconstruction technique. COMPARISON:  MRI head 08/26/2022. FINDINGS: Brain: No acute intracranial hemorrhage. No CT evidence of acute infarct. Nonspecific hypoattenuation in the periventricular and subcortical white matter favored to reflect chronic microvascular ischemic changes. Mild parenchymal volume loss. No edema, mass effect, or midline shift. The basilar cisterns are patent. Ventricles:  Prominence of the ventricles suggesting underlying parenchymal volume loss. Vascular: Atherosclerotic calcifications of the carotid siphons and intracranial vertebral arteries. No hyperdense vessel. Skull: No acute or aggressive finding. Orbits: Orbits are symmetric. Sinuses: The visualized paranasal sinuses are clear. Other: Mastoid air cells are clear. IMPRESSION: No CT evidence of acute intracranial abnormality. Mild chronic microvascular ischemic changes and mild parenchymal volume loss. Electronically Signed   By: Denny Flack M.D.   On: 07/03/2023 15:44    Procedures Procedures    Medications Ordered in ED Medications - No data to display  ED Course/ Medical Decision Making/ A&P                                 Medical Decision Making Amount and/or Complexity of Data Reviewed Labs: ordered. Radiology: ordered.   This patient presents to the ED for concern of episode of dizziness, this involves an extensive number of treatment options, and is a complaint that carries with it a high risk of complications and morbidity.  The differential diagnosis includes arrhythmia, STEMI, NSTEMI, electrolyte abnormality, hypoglycemia, UTI, orthostatic hypotension   Co morbidities that complicate the patient evaluation  A-fib, CHF, NSTEMI, COPD   Additional history obtained:  External records from outside source obtained and reviewed including cardiology office visit   Lab Tests:  I Ordered, and personally interpreted labs.  The pertinent results include:  CBC WNL, troponin 18, 20, elevated total bilirubin which is chronic per historical values, mildly elevated bun which is not uncommon for historical values, greater than 500 glucose on UA   Imaging Studies ordered:  I ordered imaging studies including CT head Noncon and chest x-ray I independently visualized and interpreted imaging which showed No acute intracranial abnormality and cardiomegaly, otherwise no acute cardiopulmonary  abnormality. I agree with the radiologist interpretation   Cardiac Monitoring: / EKG:  The patient was maintained on a cardiac monitor.  I personally viewed and interpreted the cardiac monitored which showed an underlying rhythm of: A-fib   Problem List / ED Course / Critical interventions / Medication  management I have reviewed the patients home medicines and have made adjustments as needed Orthostatic Lying BP- Lying: 132/51 Pulse- Lying: 51 Orthostatic Sitting BP- Sitting: 143/57 Pulse- Sitting: 44 (!) Orthostatic Standing at 0 minutes BP- Standing at 0 minutes: 139/55 Pulse- Standing at 0 minutes: 47 (!) Orthostatic Standing at 3 minutes BP- Standing at 3 minutes: 156/66 Pulse- Standing at 3 minutes: 55    Test / Admission - Considered:  Considered for admission or further workup however patient's vital signs, physical exam, labs, and imaging were reassuring.  Patient's symptoms likely due to bradycardia and/or hypovolemia.  Patient advised to return to the ED if symptoms persist for further evaluation and workup.        Final Clinical Impression(s) / ED Diagnoses Final diagnoses:  Near syncope    Rx / DC Orders ED Discharge Orders     None         Merryl Abraham 07/03/23 1942    Cheyenne Cotta, MD 07/04/23 [phone]

## 2023-07-03 NOTE — ED Notes (Signed)
 Patient transported to CT

## 2023-07-03 NOTE — ED Triage Notes (Signed)
 Pt states that he was eating lunch and felt dizzy almost falling backwards. Pt states that it happened out of nowhere and he was able to catch himself.

## 2023-07-03 NOTE — ED Notes (Signed)
 Pt stated he did not feel dizzy during orthostatic vitals.

## 2023-07-08 ENCOUNTER — Encounter: Payer: Self-pay | Admitting: Cardiovascular Disease

## 2023-07-08 ENCOUNTER — Ambulatory Visit: Payer: Medicare Other | Attending: Cardiovascular Disease | Admitting: Cardiovascular Disease

## 2023-07-08 VITALS — BP 140/57 | HR 59 | Ht 75.0 in | Wt 200.8 lb

## 2023-07-08 DIAGNOSIS — Z9861 Coronary angioplasty status: Secondary | ICD-10-CM | POA: Diagnosis not present

## 2023-07-08 DIAGNOSIS — E785 Hyperlipidemia, unspecified: Secondary | ICD-10-CM

## 2023-07-08 DIAGNOSIS — I739 Peripheral vascular disease, unspecified: Secondary | ICD-10-CM | POA: Diagnosis not present

## 2023-07-08 DIAGNOSIS — I10 Essential (primary) hypertension: Secondary | ICD-10-CM | POA: Diagnosis not present

## 2023-07-08 DIAGNOSIS — G4733 Obstructive sleep apnea (adult) (pediatric): Secondary | ICD-10-CM

## 2023-07-08 DIAGNOSIS — I4891 Unspecified atrial fibrillation: Secondary | ICD-10-CM

## 2023-07-08 DIAGNOSIS — I272 Pulmonary hypertension, unspecified: Secondary | ICD-10-CM | POA: Diagnosis not present

## 2023-07-08 DIAGNOSIS — I2510 Atherosclerotic heart disease of native coronary artery without angina pectoris: Secondary | ICD-10-CM

## 2023-07-08 DIAGNOSIS — I6523 Occlusion and stenosis of bilateral carotid arteries: Secondary | ICD-10-CM | POA: Diagnosis not present

## 2023-07-08 NOTE — Assessment & Plan Note (Signed)
 History of PAD status post bilateral iliac artery stenting by myself 12/21/2021 using VBX covered stents.  He did have significant SFA disease bilaterally with three-vessel runoff on the right and 2 on the left.  He really denies claudication but does have some balance issues.  His most recent Doppler studies performed 06/19/23 suggested patent stents with moderately elevated velocities bilaterally.

## 2023-07-08 NOTE — Assessment & Plan Note (Signed)
 History of dyslipidemia on high-dose statin therapy with lipid profile performed 01/17/2023 revealing total cholesterol 117, LDL 48 and HDL 51.

## 2023-07-08 NOTE — Assessment & Plan Note (Signed)
 History of essential hypertension her blood pressure measured today at 140/57.  He is on spironolactone .

## 2023-07-08 NOTE — Assessment & Plan Note (Signed)
 History of CAD status post RCA stenting by myself 01/11/1998.  He had normal circumflex and LAD and normal EF at that time.  He was admitted to the hospital with a non-STEMI 01/21/2018 and underwent cardiac catheterization by Dr. Nolan Battle revealing left main disease and underwent CABG by Dr. Sherene Dilling 01/28/2018 with LIMA to his LAD, vein to an obtuse marginal branch, PDA and PLA sequentially.  Also had left atrial clipping at that time.  He is denying chest pain or shortness of breath.

## 2023-07-08 NOTE — Assessment & Plan Note (Addendum)
 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  This will be repeated in 1 year.

## 2023-07-08 NOTE — Patient Instructions (Signed)
 Medication Instructions:  Your physician recommends that you continue on your current medications as directed. Please refer to the Current Medication list given to you today.  *If you need a refill on your cardiac medications before your next appointment, please call your pharmacy*  Testing/Procedures: Your physician has requested that you have a carotid duplex. This test is an ultrasound of the carotid arteries in your neck. It looks at blood flow through these arteries that supply the brain with blood. Allow one hour for this exam. There are no restrictions or special instructions. This will take place at [address]. 4th Floor- To do in April 2026  Please note: We ask at that you not bring children with you during ultrasound (echo/ vascular) testing. Due to room size and safety concerns, children are not allowed in the ultrasound rooms during exams. Our front office staff cannot provide observation of children in our lobby area while testing is being conducted. An adult accompanying a patient to their appointment will only be allowed in the ultrasound room at the discretion of the ultrasound technician under special circumstances. We apologize for any inconvenience.   Follow-Up: At [facility], you and your health needs are our priority.  As part of our continuing mission to provide you with exceptional heart care, our providers are all part of one team.  This team includes your primary Cardiologist (physician) and Advanced Practice Providers or APPs (Physician Assistants and Nurse Practitioners) who all work together to provide you with the care you need, when you need it.  Your next appointment:   6 month(s)  Provider:   Marlana Silvan, NP       Then, Lauro Portal, MD will plan to see you again in 12 month(s).   We recommend signing up for the patient portal called "MyChart".  Sign up information is provided on this After Visit Summary.  MyChart is used to connect with  patients for Virtual Visits (Telemedicine).  Patients are able to view lab/test results, encounter notes, upcoming appointments, etc.  Non-urgent messages can be sent to your provider as well.   To learn more about what you can do with MyChart, go to [email or web address].   Other Instructions       1st Floor: - Lobby - Registration  - Pharmacy  - Lab - Cafe  2nd Floor: - PV Lab - Diagnostic Testing (echo, CT, nuclear med)  3rd Floor: - Vacant  4th Floor: - TCTS (cardiothoracic surgery) - AFib Clinic - Structural Heart Clinic - Vascular Surgery  - Vascular Ultrasound  5th Floor: - HeartCare Cardiology (general and EP) - Clinical Pharmacy for coumadin , hypertension, lipid, weight-loss medications, and med management appointments    Valet parking services will be available as well.

## 2023-07-08 NOTE — Progress Notes (Signed)
 07/08/2023 [name]   03-Oct-1940  [id number]  Primary Physician Tena Feeling, MD Primary Cardiologist: Avanell Leigh MD FACP, [place], [place], [place]  HPI:  [name] is a 83 y.o.  mildly overweight, married Caucasian male father of 2, grandfather of 3 grandchildren who I saw  in the office 10/16/2022.[name]  His wife unfortunately has developed some dementia which is required him to perform additional tasks at home.  He has a history of CAD status post RCA stenting by myself January 11, 1998. He had normal circumflex, LAD and ejection fraction. His other problems include hypertension and chronic atrial fibrillation on Coumadin  anticoagulation, rate controlled, as well as erectile dysfunction. He is totally asymptomatic. He did have colon cancer picked up on screening colonoscopy and underwent right colectomy May 29, 2006 followed by 11 cycles of adjuvant Folfox chemotherapy. [name] He is very active exercises 6 days a week doing weight training 3 days a week, and lower extremity training 3 times a week.[name]   He was admitted to the hospital on 01/21/2018 with non-STEMI.  He underwent cardiac catheterization 2 days later by Dr. Nolan Battle revealing left main disease but disease in his LAD and RCA as well.  He underwent CABG by Dr. Sherene Dilling on 01/28/2018 with a LIMA to his LAD, vein to an obtuse marginal branch and to the PDA and PLA sequentially.  He also had left atrial clipping at that time.    He ended up completing cardiac rehab as an outpatient.   He saw Neomi Banks, NP in the office 12/14/2020 who placed him on a low-dose diuretic and obtain a 2D echocardiogram which was performed 12/20/2020 revealing severe TR with pulmonary hypertension which is new for him.  He is fairly active but does complain of dyspnea on exertion which is a new symptom.     His symptoms of dyspnea significantly improved with the addition of a diuretic.  I did go chest CTA that showed no evidence of thromboembolic disease.   In addition, I did lower extremity arterial Doppler studies that show significant iliac disease bilaterally.  He does complain of lifestyle limiting claudication.  He had a right heart cath performed by Dr. Julane Ny 06/14/2021 that showed mild pulmonary hypertension although he had V waves in both the right atrium and the wedge positions suggesting MR and TR.  He had a sleep study that showed severe obstructive sleep apnea.  He does have an appointment with Dr. Bensimhon at the end of May to discuss the right heart findings.  He may need a cardiac MRI and a TEE to further evaluate.  A chest CTA was negative for pulmonary thromboembolic disease as well as a VQ scan.  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   He has lost significant mount of weight as a result of the needed metolazone  which I placed him on.  His peripheral edema has completely resolved.  His dyspnea has resolved as well and he feels clinically improved.  He currently denies chest pain or shortness of breath.  His major complaint is lifestyle-limiting claudication with Doppler studies performed 04/02/2021 that revealed bilateral iliac disease .   I performed peripheral angiography on him 12/21/2021 revealing significant bilateral iliac disease which I stented with a 7 mm x 29 mm long VBX covered stents.  He did have tandem lesions in both SFAs.  He was discharged home the following day.  His claudication is markedly improved.  Postprocedural Doppler studies performed 01/05/2022 revealed the stents to be widely patent.   He was admitted to the hospital 6/24 with near syncope and thought to be related to dehydration.  He had a monitor which was placed that revealed A-fib with 100% burden, average heart rate in the 40s with some short runs of nonsustained ventricular tachycardia.  He is rejoined the gym and is  going back to exercising.  Since I stented his iliac arteries 12/21/2021 he denies claudication and his most recent Doppler studies 06/19/2023 suggested that his stents have remained patent.  Since I saw him almost a year ago he continues to do well.  He works out in [employer] 5 days a week right stationary bike, walking the treadmill and lifting weights.  He walks [phone] steps a day.  He denies chest pain or shortness of breath.  He does complain of some imbalance and is seeing a physical therapist for this.  He continues to deny claudication.     Current Meds  Medication Sig   Alpha-Lipoic Acid 600 MG TABS Take 1,200 mg by mouth daily.   atorvastatin  (LIPITOR ) 80 MG tablet TAKE 1 TABLET AT 6PM.   cholecalciferol  (VITAMIN D ) 1000 UNITS tablet Take 1,000 Units by mouth daily.   clopidogrel  (PLAVIX ) 75 MG tablet TAKE ONE TAB DAILY WITH BREAKFAST   Coenzyme Q10 200 MG capsule Take 200 mg by mouth daily.    Cyanocobalamin  (VITAMIN B 12 PO) Take 200 mcg by mouth daily.   ELIQUIS  5 MG TABS tablet TAKE 1 TABLET BY MOUTH TWICE DAILY.   FARXIGA  10 MG TABS tablet Take 1 tablet (10 mg total) by mouth daily.   Flaxseed, Linseed, 1000 MG CAPS Take 1,000 mg by mouth daily.   furosemide  (LASIX ) 40 MG tablet Take 1 tablet (40 mg total) by mouth 2 (two) times daily.   Magnesium  Citrate 200 MG TABS Take 400 mg by mouth daily in the afternoon.   metolazone  (ZAROXOLYN ) 2.5 MG tablet Take 1 tablet by mouth on Monday, Wednesday, and Friday   Polyethyl Glycol-Propyl Glycol (SYSTANE) 0.4-0.3 % GEL ophthalmic gel Place 1 application. into both eyes every 8 (eight) hours as needed (dry eyes).   potassium chloride  SA (KLOR-CON  M) 20 MEQ tablet Take 1 tablet (20 mEq total) by mouth 2 (two) times daily.   pyridoxine (B-6) 200 MG tablet Take 200 mg by mouth daily.   spironolactone  (ALDACTONE ) 25 MG tablet Take 1 tablet (25 mg total) by mouth daily.   vitamin C (ASCORBIC ACID) 500 MG tablet Take 500 mg by mouth 3 (three) times  daily.    Zinc 50 MG TABS Take 1 tablet by mouth daily.     No Known Allergies  Social History   Socioeconomic History   Marital status: Married    Spouse name: Rosalynd Combs   Number of children: 2   Years of education: Not on file   Highest education level: Bachelor's degree (e.g., BA, AB, BS)  Occupational History   Occupation: retired    Associate Professor: RETIRED  Tobacco Use   Smoking status: Former    Current packs/day: 0.00    Types: Cigarettes    Start date: 11/26/1963    Quit date: 11/25/1993    Years since quitting: 29.6   Smokeless tobacco: Never  Vaping Use   Vaping status: Never Used  Substance and Sexual Activity   Alcohol  use: Yes  Alcohol /week: 1.0 standard drink of alcohol     Types: 1 Cans of beer per week   Drug use: No   Sexual activity: Not on file  Other Topics Concern   Not on file  Social History Narrative   Not on file   Social Drivers of Health   Financial Resource Strain: Low Risk  (04/08/2018)   Overall Financial Resource Strain (CARDIA)    Difficulty of Paying Living Expenses: Not hard at all  Food Insecurity: No Food Insecurity (08/26/2022)   Hunger Vital Sign    Worried About Running Out of Food in the Last Year: Never true    Ran Out of Food in the Last Year: Never true  Transportation Needs: No Transportation Needs (08/26/2022)   PRAPARE - Administrator, Civil Service (Medical): No    Lack of Transportation (Non-Medical): No  Physical Activity: Unknown (04/08/2018)   Exercise Vital Sign    Days of Exercise per Week: 3 days    Minutes of Exercise per Session: Not asked  Stress: Stress Concern Present (04/08/2018)   [employer] of Occupational Health - Occupational Stress Questionnaire    Feeling of Stress : To some extent  Social Connections: Not on file  Intimate Partner Violence: Not At Risk (08/26/2022)   Humiliation, Afraid, Rape, and Kick questionnaire    Fear of Current or Ex-Partner: No    Emotionally Abused: No     Physically Abused: No    Sexually Abused: No     Review of Systems: General: negative for chills, fever, night sweats or weight changes.  Cardiovascular: negative for chest pain, dyspnea on exertion, edema, orthopnea, palpitations, paroxysmal nocturnal dyspnea or shortness of breath Dermatological: negative for rash Respiratory: negative for cough or wheezing Urologic: negative for hematuria Abdominal: negative for nausea, vomiting, diarrhea, bright red blood per rectum, melena, or hematemesis Neurologic: negative for visual changes, syncope, or dizziness All other systems reviewed and are otherwise negative except as noted above.    Blood pressure (!) 140/57, pulse (!) 59, height 6\' 3"  (1.905 m), weight 200 lb 12.8 oz (91.1 kg), SpO2 99%.  General appearance: alert and no distress Neck: no adenopathy, no carotid bruit, no JVD, supple, symmetrical, trachea midline, and thyroid  not enlarged, symmetric, no tenderness/mass/nodules Lungs: clear to auscultation bilaterally Heart: irregularly irregular rhythm Extremities: extremities normal, atraumatic, no cyanosis or edema Pulses: Diminished pedal pulses Skin: Skin color, texture, turgor normal. No rashes or lesions Neurologic: Grossly normal  EKG not performed today      ASSESSMENT AND PLAN:   Essential hypertension History of essential hypertension her blood pressure measured today at 140/57.  He is on spironolactone .  Atrial fibrillation with slow ventricular response (HCC) Permanent A-fib with slow ventricular response not on beta-blocker.  He is on Eliquis .  He wears a smart watch and has heart rates in the 40s.  He had an episode of presyncope recently for unclear reasons.  Carotid stenosis, asymptomatic Moderate right ICA stenosis by duplex ultrasound 06/19/23.  This will be repeated in 1 year.  CAD S/P percutaneous coronary angioplasty History of CAD status post RCA stenting by myself 01/11/1998.  He had normal circumflex  and LAD and normal EF at that time.  He was admitted to the hospital with a non-STEMI 01/21/2018 and underwent cardiac catheterization by Dr. Nolan Battle revealing left main disease and underwent CABG by Dr. Sherene Dilling 01/28/2018 with LIMA to his LAD, vein to an obtuse marginal branch, PDA and PLA sequentially.  Also had left  atrial clipping at that time.  He is denying chest pain or shortness of breath.  Dyslipidemia, goal LDL below 70 History of dyslipidemia on high-dose statin therapy with lipid profile performed 01/17/2023 revealing total cholesterol 117, LDL 48 and HDL 51.  Pulmonary hypertension, unspecified (HCC) Mild pulmonary hypertension with severe TR followed by Drs. Bensimhon and Stoner.  Right heart cath did not suggest significantly elevated pulmonary pressures.  He is on oral diuretic.  Peripheral arterial disease (HCC) History of PAD status post bilateral iliac artery stenting by myself 12/21/2021 using VBX covered stents.  He did have significant SFA disease bilaterally with three-vessel runoff on the right and 2 on the left.  He really denies claudication but does have some balance issues.  His most recent Doppler studies performed 06/19/23 suggested patent stents with moderately elevated velocities bilaterally.  OSA treated with BiPAP Obstructive sleep apnea on BiPAP     Avanell Leigh MD [facility], FSCAI 07/08/2023 11:13 AM

## 2023-07-08 NOTE — Assessment & Plan Note (Signed)
 Mild pulmonary hypertension with severe TR followed by Drs. Bensimhon and Stoner.  Right heart cath did not suggest significantly elevated pulmonary pressures.  He is on oral diuretic.

## 2023-07-08 NOTE — Assessment & Plan Note (Signed)
Obstructive sleep apnea on BiPAP 

## 2023-07-08 NOTE — Assessment & Plan Note (Signed)
 Permanent A-fib with slow ventricular response not on beta-blocker.  He is on Eliquis .  He wears a smart watch and has heart rates in the 40s.  He had an episode of presyncope recently for unclear reasons.

## 2023-08-24 ENCOUNTER — Other Ambulatory Visit: Payer: Self-pay | Admitting: Cardiovascular Disease

## 2023-08-27 DIAGNOSIS — M67432 Ganglion, left wrist: Secondary | ICD-10-CM | POA: Diagnosis not present

## 2023-09-12 ENCOUNTER — Other Ambulatory Visit: Payer: Self-pay | Admitting: Cardiovascular Disease

## 2023-09-12 DIAGNOSIS — I5032 Chronic diastolic (congestive) heart failure: Secondary | ICD-10-CM

## 2023-09-13 ENCOUNTER — Other Ambulatory Visit: Payer: Self-pay | Admitting: Cardiovascular Disease

## 2023-09-13 DIAGNOSIS — I10 Essential (primary) hypertension: Secondary | ICD-10-CM

## 2023-09-21 ENCOUNTER — Other Ambulatory Visit: Payer: Self-pay | Admitting: Cardiovascular Disease

## 2023-11-01 DIAGNOSIS — R634 Abnormal weight loss: Secondary | ICD-10-CM | POA: Diagnosis not present

## 2023-11-01 DIAGNOSIS — R252 Cramp and spasm: Secondary | ICD-10-CM | POA: Diagnosis not present

## 2023-11-13 NOTE — Progress Notes (Unsigned)
 Date:  11/14/2023   ID:  [name], DOB 1940/08/30, MRN [id number] The patient was identified using 2 identifiers.  PCP:  Dwight Trula SQUIBB, MD   [facility] Providers Cardiologist:  Dorn Lesches, MD     Evaluation Performed: Followup  Chief Complaint:  OSA  History of Present Illness:    [name] is a 82 y.o. male  with a hx of HTN, chronic afib, ASCAD, PHTN and colon CA s/p right colectomy and severe OSA with an AHI fo 45/hr and O2 sats as low as 85%. He underwent CPAP titration which was unsuccessful due to ongoing respiratory events.  He was started on auto BiPAP with IPAP max of 20cm H2O, EPAP min 5cm H2O and PS 4cm H2O.    He is doing well with his PAP device and thinks that he has gotten used to it.  He  tolerates the full face mask and feels the pressure is adequate.  He wakes up frequently at night for unknown reasons.   He complains of mouth and nasal dryness even with adjusting the humidity.  An Epworth Sleepiness Scale score was calculated the office today and this endorsed at 12 consistent with some daytime sleepiness that is related to sleeping only 4 hours nightly due to insomnia. Patient denies any episodes of bruxism, restless legs, No gagging hallucinations or cataplectic events.    Past Medical History:  Diagnosis Date   A-fib (HCC)    Allergy    Seasonal--pollen   Arrhythmia    CAD (coronary artery disease) 1999   RCA stent   Carotid stenosis, asymptomatic    moderate RT and mild LT with carotid dopplers 10/26/11   Colon cancer (HCC) 04/2006   Stage III--T3 N1   COPD (chronic obstructive pulmonary disease) (HCC)    H/O cardiovascular stress test 01/13/2010   low risk scan, similar to 2008 study   H/O echocardiogram 05/23/2009   EF >55%, mild MR, Mild TR,    Hand foot syndrome    Hemorrhoids    Hyperlipidemia    Hypertension    Neuropathy    OSA treated with BiPAP    severe OSA with an AHI fo 45/hr and O2 sats as low as  85%. On auto BiPAP   Permanent atrial fibrillation (HCC)    Psoriasis    Tubular adenoma of colon    Past Surgical History:  Procedure Laterality Date   ABDOMINAL AORTOGRAM W/LOWER EXTREMITY N/A 12/21/2021   Procedure: ABDOMINAL AORTOGRAM W/LOWER EXTREMITY;  Surgeon: Lesches Dorn PARAS, MD;  Location: [facility] INVASIVE CV LAB;  Service: Cardiovascular;  Laterality: N/A;   CARDIAC CATHETERIZATION     CLIPPING OF ATRIAL APPENDAGE  01/28/2018   Procedure: CLIPPING OF ATRIAL APPENDAGE;  Surgeon: Lucas Dorise POUR, MD;  Location: [facility] OR;  Service: Open Heart Surgery;;   COLONOSCOPY     CORONARY ARTERY BYPASS GRAFT N/A 01/28/2018   Procedure: CORONARY ARTERY BYPASS GRAFTING (CABG) X 4 USING LEFT INTERNAL MAMMARY ARTERY AND LEFT GREATER SAPHENOUS VEIN HARVESTED ENDOSCOPICALLY;  Surgeon: Lucas Dorise POUR, MD;  Location: [facility] OR;  Service: Open Heart Surgery;  Laterality: N/A;   CORONARY STENT PLACEMENT  1999   RCA tandem NIR stents   LEFT HEART CATH AND CORONARY ANGIOGRAPHY N/A 01/23/2018   Procedure: LEFT HEART CATH AND CORONARY ANGIOGRAPHY;  Surgeon: Mady Bruckner, MD;  Location: [facility] INVASIVE CV LAB;  Service: Cardiovascular;  Laterality: N/A;   PERIPHERAL VASCULAR ATHERECTOMY Bilateral 12/21/2021  Procedure: PERIPHERAL VASCULAR ATHERECTOMY;  Surgeon: Court Dorn PARAS, MD;  Location: [facility] INVASIVE CV LAB;  Service: Cardiovascular;  Laterality: Bilateral;  common illiac   PERIPHERAL VASCULAR INTERVENTION Bilateral 12/21/2021   Procedure: PERIPHERAL VASCULAR INTERVENTION;  Surgeon: Court Dorn PARAS, MD;  Location: [facility] INVASIVE CV LAB;  Service: Cardiovascular;  Laterality: Bilateral;  Common Illiacs   RIGHT COLECTOMY  05/29/2006   Laparoscopic assisted   RIGHT HEART CATH N/A 06/14/2021   Procedure: RIGHT HEART CATH;  Surgeon: Cherrie Toribio SAUNDERS, MD;  Location: [facility] INVASIVE CV LAB;  Service: Cardiovascular;  Laterality: N/A;   TEE WITHOUT CARDIOVERSION N/A 01/28/2018   Procedure: TRANSESOPHAGEAL ECHOCARDIOGRAM  (TEE);  Surgeon: Lucas Dorise POUR, MD;  Location: [facility] OR;  Service: Open Heart Surgery;  Laterality: N/A;   TONSILLECTOMY       Current Meds  Medication Sig   Alpha-Lipoic Acid 600 MG TABS Take 1,200 mg by mouth daily.   atorvastatin  (LIPITOR ) 80 MG tablet TAKE ONE TABLET AT SIX IN THE EVENING.   cholecalciferol  (VITAMIN D ) 1000 UNITS tablet Take 1,000 Units by mouth daily.   clopidogrel  (PLAVIX ) 75 MG tablet TAKE ONE TAB DAILY WITH BREAKFAST   Coenzyme Q10 200 MG capsule Take 200 mg by mouth daily.    Cyanocobalamin  (VITAMIN B 12 PO) Take 200 mcg by mouth daily.   dapagliflozin  propanediol (FARXIGA ) 10 MG TABS tablet Take 1 tablet (10 mg total) by mouth daily.   ELIQUIS  5 MG TABS tablet TAKE 1 TABLET BY MOUTH TWICE DAILY.   Flaxseed, Linseed, 1000 MG CAPS Take 1,000 mg by mouth daily.   furosemide  (LASIX ) 40 MG tablet Take 1 tablet (40 mg total) by mouth 2 (two) times daily.   Magnesium  Citrate 200 MG TABS Take 400 mg by mouth daily in the afternoon.   metolazone  (ZAROXOLYN ) 2.5 MG tablet Take 1 tablet by mouth on Monday, Wednesday, and Friday   Polyethyl Glycol-Propyl Glycol (SYSTANE) 0.4-0.3 % GEL ophthalmic gel Place 1 application. into both eyes every 8 (eight) hours as needed (dry eyes).   potassium chloride  SA (KLOR-CON  M) 20 MEQ tablet Take 1 tablet (20 mEq total) by mouth 2 (two) times daily.   pyridoxine (B-6) 200 MG tablet Take 200 mg by mouth daily.   spironolactone  (ALDACTONE ) 25 MG tablet Take 1 tablet (25 mg total) by mouth daily.   vitamin C (ASCORBIC ACID) 500 MG tablet Take 500 mg by mouth 3 (three) times daily.    Zinc 50 MG TABS Take 1 tablet by mouth daily.     Allergies:   Patient has no known allergies.   Social History   Tobacco Use   Smoking status: Former    Current packs/day: 0.00    Types: Cigarettes    Start date: 11/26/1963    Quit date: 11/25/1993    Years since quitting: 29.9   Smokeless tobacco: Never  Vaping Use   Vaping status: Never Used   Substance Use Topics   Alcohol  use: Yes    Alcohol /week: 1.0 standard drink of alcohol     Types: 1 Cans of beer per week   Drug use: No     Family Hx: The patient's family history includes Healthy in his sister; Heart disease in his mother, paternal grandfather, and paternal grandmother; Heart disease (age of onset: 8) in his father; Hypertension in his mother and paternal grandfather. There is no history of Colon cancer.  ROS:   Please see the history of present illness.     All other  systems reviewed and are negative.   Prior Sleep studies:   The following studies were reviewed today:  PAP titration 07/18/2021 IMPRESSIONS - An optimal PAP pressure was selected for this patient ( 22/18 cm of water) - Central sleep apnea was not noted during this titration (CAI = 4.6/h). - Mild oxygen  desaturations were observed during this titration (min O2 = 87.0%). - The patient snored with soft snoring volume. - No cardiac abnormalities were observed during this study. - Clinically significant periodic limb movements were not noted during this study. Arousals associated with PLMs were rare.   DIAGNOSIS - Obstructive Sleep Apnea (G47.33)   RECOMMENDATIONS - Trial of auto BiPAP therapy with IPAP max 20cm H2O, EPAP min 5cm H2O and PS 4cm H2O with a Medium size [employer]Paykel Full Face Simplus mask and heated humidification. - Avoid alcohol , sedatives and other CNS depressants that may worsen sleep apnea and disrupt normal sleep architecture. - Sleep hygiene should be reviewed to assess factors that may improve sleep quality. - Weight management and regular exercise should be initiated or continued. - Return to Sleep Center for re-evaluation after 6 weeks of therapy  Home Sleep Study  06/04/2021 FINDINGS: 1. Severe Obstructive Sleep Apnea with AHI 45 /hr. 2. Mild Central Sleep Apnea with 15/hr. 3. Oxygen  desaturations as low as 85%. 4. Minimal snoring was present. O2 sats were < 88% for 3.1  min. 5. Total sleep time was 5 hrs and 28 min. 6. 20% of total sleep time was spent in REM sleep. 7. Normal sleep onset latency at 15 min. 8. Shortened REM sleep onset latency at 54 min. 9. Total awakenings were 4.   DIAGNOSIS: Severe Obstructive Sleep Apnea (G47.33) Central Sleep Apnea  Labs/Other Tests and Data Reviewed:     Recent Labs: 06/19/2023: BNP 71.4 07/03/2023: ALT 40; BUN 28; Creatinine, Ser 0.83; Hemoglobin 15.0; Platelets 163; Potassium 3.8; Sodium 132    Wt Readings from Last 3 Encounters:  11/14/23 195 lb 6.4 oz (88.6 kg)  07/08/23 200 lb 12.8 oz (91.1 kg)  07/03/23 204 lb 5.9 oz (92.7 kg)     Risk Assessment/Calculations:          Objective:    Vital Signs:  BP (!) 140/46   Pulse (!) 46   Ht 6' 3 (1.905 m)   Wt 195 lb 6.4 oz (88.6 kg)   SpO2 99%   BMI 24.42 kg/m   GEN: Well nourished, well developed in no acute distress HEENT: Normal NECK: No JVD; No carotid bruits LYMPHATICS: No lymphadenopathy CARDIAC:RRR, no murmurs, rubs, gallops RESPIRATORY:  Clear to auscultation without rales, wheezing or rhonchi  ABDOMEN: Soft, non-tender, non-distended MUSCULOSKELETAL:  No edema; No deformity  SKIN: Warm and dry NEUROLOGIC:  Alert and oriented x 3 PSYCHIATRIC:  Normal affect  ASSESSMENT & PLAN:    OSA - The patient is tolerating PAP therapy well without any problems. The PAP download performed by his DME was personally reviewed and interpreted by me today and showed an AHI of 1.6 /hr on auto BiPAP with IPAP max 20cm H2O, EPAP min 5cm H2O and PS 4 cm H2O with 90% compliance in using more than 4 hours nightly.  The patient has been using and benefiting from PAP use and will continue to benefit from therapy.  -he has problems with insomnia throughout the night despite practicing good sleep hygiene -given his advanced age would not recommend a sleep aide  HTN -BP controlled on exam today -Continue spironolactone  25 mg daily with  as needed  refills    Medication Adjustments/Labs and Tests Ordered: Current medicines are reviewed at length with the patient today.  Concerns regarding medicines are outlined above.   Tests Ordered: No orders of the defined types were placed in this encounter.   Medication Changes: No orders of the defined types were placed in this encounter.   Follow Up:   in 1 year(s)  Signed, Wilbert Bihari, MD  11/14/2023 8:29 AM    [facility]

## 2023-11-14 ENCOUNTER — Ambulatory Visit: Attending: Cardiology | Admitting: Cardiology

## 2023-11-14 ENCOUNTER — Encounter: Payer: Self-pay | Admitting: Cardiovascular Disease

## 2023-11-14 ENCOUNTER — Encounter: Payer: Self-pay | Admitting: Cardiology

## 2023-11-14 VITALS — BP 140/46 | HR 46 | Ht 75.0 in | Wt 195.4 lb

## 2023-11-14 DIAGNOSIS — I10 Essential (primary) hypertension: Secondary | ICD-10-CM

## 2023-11-14 DIAGNOSIS — G4733 Obstructive sleep apnea (adult) (pediatric): Secondary | ICD-10-CM | POA: Diagnosis not present

## 2023-11-14 NOTE — Patient Instructions (Signed)

## 2023-11-16 ENCOUNTER — Other Ambulatory Visit: Payer: Self-pay | Admitting: Cardiovascular Disease

## 2023-11-16 DIAGNOSIS — I4821 Permanent atrial fibrillation: Secondary | ICD-10-CM

## 2023-11-19 MED ORDER — CILOSTAZOL 50 MG PO TABS: 50.0000 mg | ORAL_TABLET | Freq: Two times a day (BID) | ORAL | 1 refills | Status: AC

## 2023-11-19 NOTE — Telephone Encounter (Signed)
 Prescription refill request for Eliquis  received. Indication:afib Last office visit:8/25 Scr:0.83  4/25 Age: 83 Weight:88.6  kg  Prescription refilled

## 2023-12-06 DIAGNOSIS — L989 Disorder of the skin and subcutaneous tissue, unspecified: Secondary | ICD-10-CM | POA: Diagnosis not present

## 2023-12-09 ENCOUNTER — Encounter (HOSPITAL_COMMUNITY): Payer: Self-pay

## 2023-12-09 ENCOUNTER — Inpatient Hospital Stay (HOSPITAL_COMMUNITY)
Admission: EM | Admit: 2023-12-09 | Discharge: 2023-12-11 | DRG: 312 | Disposition: A | Discharge status: Home / Self Care | Attending: Internal Medicine | Admitting: Internal Medicine

## 2023-12-09 ENCOUNTER — Emergency Department (HOSPITAL_COMMUNITY)

## 2023-12-09 ENCOUNTER — Other Ambulatory Visit: Payer: Self-pay

## 2023-12-09 DIAGNOSIS — S0990XA Unspecified injury of head, initial encounter: Secondary | ICD-10-CM | POA: Diagnosis not present

## 2023-12-09 DIAGNOSIS — I4891 Unspecified atrial fibrillation: Secondary | ICD-10-CM | POA: Diagnosis not present

## 2023-12-09 DIAGNOSIS — Z8249 Family history of ischemic heart disease and other diseases of the circulatory system: Secondary | ICD-10-CM

## 2023-12-09 DIAGNOSIS — W19XXXA Unspecified fall, initial encounter: Secondary | ICD-10-CM | POA: Diagnosis not present

## 2023-12-09 DIAGNOSIS — R519 Headache, unspecified: Secondary | ICD-10-CM | POA: Diagnosis not present

## 2023-12-09 DIAGNOSIS — Z951 Presence of aortocoronary bypass graft: Secondary | ICD-10-CM

## 2023-12-09 DIAGNOSIS — I6782 Cerebral ischemia: Secondary | ICD-10-CM | POA: Diagnosis not present

## 2023-12-09 DIAGNOSIS — I951 Orthostatic hypotension: Principal | ICD-10-CM | POA: Diagnosis present

## 2023-12-09 DIAGNOSIS — Z860101 Personal history of adenomatous and serrated colon polyps: Secondary | ICD-10-CM

## 2023-12-09 DIAGNOSIS — S3993XA Unspecified injury of pelvis, initial encounter: Secondary | ICD-10-CM | POA: Diagnosis not present

## 2023-12-09 DIAGNOSIS — S4991XA Unspecified injury of right shoulder and upper arm, initial encounter: Secondary | ICD-10-CM | POA: Diagnosis not present

## 2023-12-09 DIAGNOSIS — S0003XA Contusion of scalp, initial encounter: Secondary | ICD-10-CM | POA: Diagnosis not present

## 2023-12-09 DIAGNOSIS — J439 Emphysema, unspecified: Secondary | ICD-10-CM | POA: Diagnosis not present

## 2023-12-09 DIAGNOSIS — Y92009 Unspecified place in unspecified non-institutional (private) residence as the place of occurrence of the external cause: Secondary | ICD-10-CM

## 2023-12-09 DIAGNOSIS — Z9861 Coronary angioplasty status: Secondary | ICD-10-CM | POA: Diagnosis not present

## 2023-12-09 DIAGNOSIS — I482 Chronic atrial fibrillation, unspecified: Secondary | ICD-10-CM

## 2023-12-09 DIAGNOSIS — G4733 Obstructive sleep apnea (adult) (pediatric): Secondary | ICD-10-CM | POA: Diagnosis not present

## 2023-12-09 DIAGNOSIS — J449 Chronic obstructive pulmonary disease, unspecified: Secondary | ICD-10-CM | POA: Diagnosis not present

## 2023-12-09 DIAGNOSIS — I252 Old myocardial infarction: Secondary | ICD-10-CM

## 2023-12-09 DIAGNOSIS — Z85038 Personal history of other malignant neoplasm of large intestine: Secondary | ICD-10-CM

## 2023-12-09 DIAGNOSIS — Z79899 Other long term (current) drug therapy: Secondary | ICD-10-CM

## 2023-12-09 DIAGNOSIS — S41112A Laceration without foreign body of left upper arm, initial encounter: Secondary | ICD-10-CM | POA: Diagnosis not present

## 2023-12-09 DIAGNOSIS — R55 Syncope and collapse: Secondary | ICD-10-CM | POA: Diagnosis not present

## 2023-12-09 DIAGNOSIS — Z6824 Body mass index (BMI) 24.0-24.9, adult: Secondary | ICD-10-CM

## 2023-12-09 DIAGNOSIS — R634 Abnormal weight loss: Secondary | ICD-10-CM | POA: Diagnosis not present

## 2023-12-09 DIAGNOSIS — I872 Venous insufficiency (chronic) (peripheral): Secondary | ICD-10-CM | POA: Diagnosis not present

## 2023-12-09 DIAGNOSIS — S0093XA Contusion of unspecified part of head, initial encounter: Secondary | ICD-10-CM | POA: Diagnosis not present

## 2023-12-09 DIAGNOSIS — Z7902 Long term (current) use of antithrombotics/antiplatelets: Secondary | ICD-10-CM

## 2023-12-09 DIAGNOSIS — Y9301 Activity, walking, marching and hiking: Secondary | ICD-10-CM | POA: Diagnosis present

## 2023-12-09 DIAGNOSIS — W010XXA Fall on same level from slipping, tripping and stumbling without subsequent striking against object, initial encounter: Secondary | ICD-10-CM | POA: Diagnosis present

## 2023-12-09 DIAGNOSIS — Z9049 Acquired absence of other specified parts of digestive tract: Secondary | ICD-10-CM | POA: Diagnosis not present

## 2023-12-09 DIAGNOSIS — Z043 Encounter for examination and observation following other accident: Secondary | ICD-10-CM | POA: Diagnosis not present

## 2023-12-09 DIAGNOSIS — R42 Dizziness and giddiness: Secondary | ICD-10-CM | POA: Diagnosis not present

## 2023-12-09 DIAGNOSIS — Z955 Presence of coronary angioplasty implant and graft: Secondary | ICD-10-CM | POA: Diagnosis not present

## 2023-12-09 DIAGNOSIS — Z7901 Long term (current) use of anticoagulants: Secondary | ICD-10-CM

## 2023-12-09 DIAGNOSIS — R001 Bradycardia, unspecified: Secondary | ICD-10-CM | POA: Diagnosis present

## 2023-12-09 DIAGNOSIS — E785 Hyperlipidemia, unspecified: Secondary | ICD-10-CM | POA: Diagnosis present

## 2023-12-09 DIAGNOSIS — I10 Essential (primary) hypertension: Secondary | ICD-10-CM | POA: Diagnosis not present

## 2023-12-09 DIAGNOSIS — S299XXA Unspecified injury of thorax, initial encounter: Secondary | ICD-10-CM | POA: Diagnosis not present

## 2023-12-09 DIAGNOSIS — I452 Bifascicular block: Secondary | ICD-10-CM | POA: Diagnosis not present

## 2023-12-09 DIAGNOSIS — S59902A Unspecified injury of left elbow, initial encounter: Secondary | ICD-10-CM | POA: Diagnosis not present

## 2023-12-09 DIAGNOSIS — I2510 Atherosclerotic heart disease of native coronary artery without angina pectoris: Secondary | ICD-10-CM | POA: Diagnosis present

## 2023-12-09 DIAGNOSIS — R58 Hemorrhage, not elsewhere classified: Secondary | ICD-10-CM | POA: Diagnosis not present

## 2023-12-09 DIAGNOSIS — I5032 Chronic diastolic (congestive) heart failure: Secondary | ICD-10-CM | POA: Diagnosis present

## 2023-12-09 DIAGNOSIS — S41111A Laceration without foreign body of right upper arm, initial encounter: Secondary | ICD-10-CM | POA: Diagnosis not present

## 2023-12-09 DIAGNOSIS — S3991XA Unspecified injury of abdomen, initial encounter: Secondary | ICD-10-CM | POA: Diagnosis not present

## 2023-12-09 DIAGNOSIS — I272 Pulmonary hypertension, unspecified: Secondary | ICD-10-CM | POA: Diagnosis present

## 2023-12-09 DIAGNOSIS — I739 Peripheral vascular disease, unspecified: Secondary | ICD-10-CM | POA: Diagnosis not present

## 2023-12-09 DIAGNOSIS — I4821 Permanent atrial fibrillation: Secondary | ICD-10-CM | POA: Diagnosis present

## 2023-12-09 DIAGNOSIS — I110 Hypertensive heart disease with heart failure: Secondary | ICD-10-CM | POA: Diagnosis not present

## 2023-12-09 DIAGNOSIS — Z87891 Personal history of nicotine dependence: Secondary | ICD-10-CM | POA: Diagnosis not present

## 2023-12-09 DIAGNOSIS — E860 Dehydration: Secondary | ICD-10-CM | POA: Diagnosis not present

## 2023-12-09 HISTORY — DX: Bifascicular block: I45.2

## 2023-12-09 HISTORY — DX: Bradycardia, unspecified: R00.1

## 2023-12-09 HISTORY — DX: Rheumatic tricuspid insufficiency: I07.1

## 2023-12-09 HISTORY — DX: Chronic diastolic (congestive) heart failure: I50.32

## 2023-12-09 HISTORY — DX: Pulmonary hypertension, unspecified: I27.20

## 2023-12-09 LAB — COMPREHENSIVE METABOLIC PANEL WITH GFR
ALT: 35 U/L (ref 0–44)
AST: 36 U/L (ref 15–41)
Albumin: 4.1 g/dL (ref 3.5–5.0)
Alkaline Phosphatase: 71 U/L (ref 38–126)
Anion gap: 12 (ref 5–15)
BUN: 31 mg/dL — ABNORMAL HIGH (ref 8–23)
CO2: 24 mmol/L (ref 22–32)
Calcium: 9.5 mg/dL (ref 8.9–10.3)
Chloride: 100 mmol/L (ref 98–111)
Creatinine, Ser: 1.29 mg/dL — ABNORMAL HIGH (ref 0.61–1.24)
GFR, Estimated: 56 mL/min — ABNORMAL LOW (ref >60)
Glucose, Bld: 116 mg/dL — ABNORMAL HIGH (ref 70–99)
Potassium: 3.9 mmol/L (ref 3.5–5.1)
Sodium: 136 mmol/L (ref 135–145)
Total Bilirubin: 1.1 mg/dL (ref 0.0–1.2)
Total Protein: 6.6 g/dL (ref 6.5–8.1)

## 2023-12-09 LAB — I-STAT CG4 LACTIC ACID, ED: Lactic Acid, Venous: 1.6 mmol/L (ref 0.5–1.9)

## 2023-12-09 LAB — CBC
HCT: 42.7 % (ref 39.0–52.0)
Hemoglobin: 14.3 g/dL (ref 13.0–17.0)
MCH: 32.4 pg (ref 26.0–34.0)
MCHC: 33.5 g/dL (ref 30.0–36.0)
MCV: 96.6 fL (ref 80.0–100.0)
Platelets: 175 K/uL (ref 150–400)
RBC: 4.42 MIL/uL (ref 4.22–5.81)
RDW: 13.7 % (ref 11.5–15.5)
WBC: 7.6 K/uL (ref 4.0–10.5)
nRBC: 0 % (ref 0.0–0.2)

## 2023-12-09 LAB — I-STAT CHEM 8, ED
BUN: 32 mg/dL — ABNORMAL HIGH (ref 8–23)
Calcium, Ion: 1.19 mmol/L (ref 1.15–1.40)
Chloride: 100 mmol/L (ref 98–111)
Creatinine, Ser: 1.3 mg/dL — ABNORMAL HIGH (ref 0.61–1.24)
Glucose, Bld: 115 mg/dL — ABNORMAL HIGH (ref 70–99)
HCT: 40 % (ref 39.0–52.0)
Hemoglobin: 13.6 g/dL (ref 13.0–17.0)
Potassium: 3.9 mmol/L (ref 3.5–5.1)
Sodium: 137 mmol/L (ref 135–145)
TCO2: 25 mmol/L (ref 22–32)

## 2023-12-09 LAB — PROTIME-INR
INR: 1 (ref 0.8–1.2)
Prothrombin Time: 14.2 s (ref 11.4–15.2)

## 2023-12-09 LAB — URINALYSIS, ROUTINE W REFLEX MICROSCOPIC
Bilirubin Urine: NEGATIVE
Glucose, UA: 150 mg/dL — AB
Hgb urine dipstick: NEGATIVE
Ketones, ur: NEGATIVE mg/dL
Leukocytes,Ua: NEGATIVE
Nitrite: NEGATIVE
Protein, ur: NEGATIVE mg/dL
Specific Gravity, Urine: >1.046 — ABNORMAL HIGH (ref 1.005–1.030)
pH: 6 (ref 5.0–8.0)

## 2023-12-09 LAB — SURGICAL PCR SCREEN
MRSA, PCR: NEGATIVE
Staphylococcus aureus: NEGATIVE

## 2023-12-09 LAB — MAGNESIUM: Magnesium: 2.3 mg/dL (ref 1.7–2.4)

## 2023-12-09 LAB — BRAIN NATRIURETIC PEPTIDE: B Natriuretic Peptide: 63.5 pg/mL (ref 0.0–100.0)

## 2023-12-09 LAB — SAMPLE TO BLOOD BANK

## 2023-12-09 LAB — TSH: TSH: 1.488 u[IU]/mL (ref 0.350–4.500)

## 2023-12-09 LAB — TROPONIN I (HIGH SENSITIVITY)
Troponin I (High Sensitivity): 24 ng/L — ABNORMAL HIGH (ref <18)
Troponin I (High Sensitivity): 49 ng/L — ABNORMAL HIGH (ref <18)
Troponin I (High Sensitivity): 85 ng/L — ABNORMAL HIGH (ref <18)
Troponin I (High Sensitivity): 77 ng/L — ABNORMAL HIGH (ref <18)

## 2023-12-09 LAB — ETHANOL: Alcohol, Ethyl (B): <15 mg/dL (ref <15)

## 2023-12-09 MED ORDER — ATROPINE SULFATE 1 MG/10ML IJ SOSY: 1.0000 mg | PREFILLED_SYRINGE | INTRAMUSCULAR | Status: DC | PRN

## 2023-12-09 MED ORDER — FUROSEMIDE 20 MG PO TABS
40.0000 mg | ORAL_TABLET | Freq: Two times a day (BID) | ORAL | Status: DC
Start: 2023-12-09 — End: 2023-12-09

## 2023-12-09 MED ORDER — SODIUM CHLORIDE 0.9% FLUSH
3.0000 mL | Freq: Two times a day (BID) | INTRAVENOUS | Status: DC
  Administered 2023-12-09 – 2023-12-11 (×4): 3 mL via INTRAVENOUS

## 2023-12-09 MED ORDER — ATORVASTATIN CALCIUM 80 MG PO TABS
80.0000 mg | ORAL_TABLET | Freq: Every day | ORAL | Status: DC
Start: 2023-12-09 — End: 2023-12-11
  Administered 2023-12-09 – 2023-12-11 (×3): 80 mg via ORAL
  Filled 2023-12-09 (×2): qty 1
  Filled 2023-12-09: qty 2

## 2023-12-09 MED ORDER — FUROSEMIDE 40 MG PO TABS
40.0000 mg | ORAL_TABLET | Freq: Two times a day (BID) | ORAL | Status: DC
Start: 2023-12-09 — End: 2023-12-10
  Administered 2023-12-09: 40 mg via ORAL
  Filled 2023-12-09: qty 1

## 2023-12-09 MED ORDER — IOHEXOL 350 MG/ML SOLN
75.0000 mL | Freq: Once | INTRAVENOUS | Status: AC | PRN
  Administered 2023-12-09: 75 mL via INTRAVENOUS

## 2023-12-09 MED ORDER — CLOPIDOGREL BISULFATE 75 MG PO TABS
75.0000 mg | ORAL_TABLET | Freq: Every day | ORAL | Status: DC
Start: 2023-12-10 — End: 2023-12-10

## 2023-12-09 MED ORDER — APIXABAN 5 MG PO TABS
5.0000 mg | ORAL_TABLET | Freq: Two times a day (BID) | ORAL | Status: DC
  Administered 2023-12-09 – 2023-12-11 (×4): 5 mg via ORAL
  Filled 2023-12-09 (×4): qty 1

## 2023-12-09 MED ORDER — SODIUM CHLORIDE 0.9% FLUSH: 3.0000 mL | INTRAVENOUS | Status: DC | PRN

## 2023-12-09 MED ORDER — LACTATED RINGERS IV BOLUS
1000.0000 mL | Freq: Once | INTRAVENOUS | Status: AC
  Administered 2023-12-09: 1000 mL via INTRAVENOUS

## 2023-12-09 MED ORDER — SODIUM CHLORIDE 0.9 % IV SOLN: 250.0000 mL | INTRAVENOUS | Status: AC | PRN

## 2023-12-09 NOTE — ED Triage Notes (Signed)
 Pt arrives via ems for mechanical fall from home.unsure what he fell on but possible furniture. Complaints of head pain where laceration is. Pt reports almost LOC, saw stars but was conscious the whole time Approx 4in hematoma to back of head, on eliquis . Has numbness on legs intermittent that is normal, denies neck pain  160/palp  60 HR  97% RA  CBG 122

## 2023-12-09 NOTE — ED Notes (Signed)
 EDP at [facility]

## 2023-12-09 NOTE — Plan of Care (Signed)

## 2023-12-09 NOTE — Assessment & Plan Note (Signed)
 Continue atorvastatin and Eliquis.

## 2023-12-09 NOTE — Assessment & Plan Note (Addendum)
 Suspect secondary to symptomatic bradycardia.  Cardiology consulted. Will admit to cardiac floor continuous telemetry monitoring continuous pulse oximetry.  Differentials also include TIA and CVA and will obtain MRI of the brain.  Additional evaluation per cardiology.  As needed atropine  for heart rate below 50s.  Electrolyte check TFTs.

## 2023-12-09 NOTE — ED Notes (Signed)
 CCMD contacted to place the patient on cardiac monitoring services.

## 2023-12-09 NOTE — Assessment & Plan Note (Signed)
 Cpap per home settings.

## 2023-12-09 NOTE — Assessment & Plan Note (Signed)
 Currently on eliquis .  We will continue once med rec is available.

## 2023-12-09 NOTE — Consult Note (Addendum)
 Cardiology Consultation   Patient ID: [name] MRN: [id number]; DOB: 06/18/1940  Admit date: 12/09/2023 Date of Consult: 12/09/2023  PCP:  Dwight Trula SQUIBB, MD   [facility] Providers Cardiologist:  Dorn Lesches, MD  Advanced Heart Failure:  Morene JINNY Brownie, MD  Sleep Medicine:  Wilbert Bihari, MD       Patient Profile: [name] is a 83 y.o. 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 Tobie.  History of Present Illness: Mr. Deneault follows with Dr. Lesches for cardiac history and Dr. Bihari for OSA. Last ischemic assessment was at time of his NSTEMI in 2019 prompting CABG. He had a RHC by Dr. Bensimhon in 05/2021 for pHTN felt to be mild related to valvular disease with severe tricuspid regurgitation. There was mention of obtaining TEE but eventually not pursued. VQ scan 06/2021 negative for PE. Sleep study showed OSA and he eventually required BiPAP.  cMRI in 01/2022 showed mild LVH, EF 71%, no infiltrative disease, moderate ascending dilation of 4.2cm, severe RAE, moderate LAE. He also had iliac stenting in 12/2021. He is also known to have permanent atrial fibrillation with chronic borderline bradycardia seen by EP. He had prior near-syncope in 08/2022 felt to be hypovolemic requiring d/c of spironolactone  and reduction of furosemide . He later required re-escalation of dosing with readdition of spironolactone , titration of Lasix  + metolazone  due to volume retention. Last echo was in 08/2022 (with HR in 30s-40s) showing EF 55-60%, mild LVH,  mildly reduced RVSF, moderately elevated PASP, moderate LAE, severe RAE, moderate TR, borderline dilation of aortic root. 14 day Zio 09/2022 showed continuous atrial fibrillation (100% burden), bradycardia with average heart rate in the 40s, occasionally abberrantly conducted beats, 3 short NSVT. He was not felt to require PPM.  He last saw advanced HF team who felt that chronic HFpEF was likely moderate restrictive disease versus poor atrial compliance given v waves on RHC. They suspected pulmonary HTN was combination group II/III with plan for PRN f/u only going forward. Dr. Bensimhon also previously suspected component of venous insufficiency with stasis dermatitis with psoriatic irritation, though cautioned regarding compression with known underlying PAD.  He has been doing well recently, going to the gym 5 days a week without angina, dyspnea, dizziness. He has had significant unintentional weight loss over the last 10 months (238->194lb). This morning he was walking out of the bathroom after a shower today and reports he lost his balance while trying to put on his shirt. He fell backwards, striking his back and head. He reports he otherwise had not fallen recently in years. He denies any preceding dizziness. He denies any loss of consciousness. He was able to speak right away. No B/b incontinence. When he could not physically hoist himself up, his wife summoned EMS. Labs show normal CBC, normal lactic acid, hsTroponin 24->49->85->77, BNP wnl, AKI with BUN 31, Cr 1.29 compared to prior Cr 0.83. Pan scan showed no acute abnormality (non-urgent ancillary findings per primary team). Initial BP 141/60. HR in ER upper 30s-50s in known Afib. Orthostatics were positive for drop  in BP, HR trends relatively flat:  Orthostatic VS Lying BP 136/54 HR 51 Sitting BP 152/62 HR 50 Standing 66m 171/68 BP HR 70 Standing 107m 99/57 BP HR 53  --> He received 1L saline bolus. Aside from MSK soreness from fall, no cardiac  complaints. His main limitation of QOL recently has been difficulty staying asleep. No other acute focal neurologic symptoms.  Past Medical History:  Diagnosis Date   Allergy    Seasonal--pollen   Bradycardia    CAD (coronary artery disease)    Carotid stenosis, asymptomatic    Chronic heart failure with preserved ejection fraction (HFpEF) (HCC)    Colon cancer (HCC) 04/2006   Stage III--T3 N1   COPD (chronic obstructive pulmonary disease) (HCC)    Hand foot syndrome    Hemorrhoids    Hyperlipidemia    Hypertension    Neuropathy    OSA treated with BiPAP    severe OSA with an AHI fo 45/hr and O2 sats as low as 85%. On auto BiPAP   perm    Permanent atrial fibrillation (HCC)    Psoriasis    Pulmonary hypertension (HCC)    Right bundle branch block (RBBB) with left anterior fascicular block (LAFB)    Tricuspid regurgitation    Tubular adenoma of colon     Past Surgical History:  Procedure Laterality Date   ABDOMINAL AORTOGRAM W/LOWER EXTREMITY N/A 12/21/2021   Procedure: ABDOMINAL AORTOGRAM W/LOWER EXTREMITY;  Surgeon: Court Dorn PARAS, MD;  Location: [facility];  Service: Cardiovascular;  Laterality: N/A;   CARDIAC CATHETERIZATION     CLIPPING OF ATRIAL APPENDAGE  01/28/2018   Procedure: CLIPPING OF ATRIAL APPENDAGE;  Surgeon: Lucas Dorise POUR, MD;  Location: [facility] OR;  Service: Open Heart Surgery;;   COLONOSCOPY     CORONARY ARTERY BYPASS GRAFT N/A 01/28/2018   Procedure: CORONARY ARTERY BYPASS GRAFTING (CABG) X 4 USING LEFT INTERNAL MAMMARY ARTERY AND LEFT GREATER SAPHENOUS VEIN HARVESTED ENDOSCOPICALLY;  Surgeon: Lucas Dorise POUR, MD;  Location: [facility] OR;  Service: Open Heart Surgery;  Laterality: N/A;   CORONARY STENT PLACEMENT  1999   RCA tandem NIR stents   LEFT HEART CATH AND CORONARY ANGIOGRAPHY N/A 01/23/2018   Procedure: LEFT HEART CATH AND CORONARY ANGIOGRAPHY;  Surgeon: Mady Bruckner, MD;  Location: [facility];  Service: Cardiovascular;  Laterality: N/A;    PERIPHERAL VASCULAR ATHERECTOMY Bilateral 12/21/2021   Procedure: PERIPHERAL VASCULAR ATHERECTOMY;  Surgeon: Court Dorn PARAS, MD;  Location: [facility];  Service: Cardiovascular;  Laterality: Bilateral;  common illiac   PERIPHERAL VASCULAR INTERVENTION Bilateral 12/21/2021   Procedure: PERIPHERAL VASCULAR INTERVENTION;  Surgeon: Court Dorn PARAS, MD;  Location: [facility];  Service: Cardiovascular;  Laterality: Bilateral;  Common Illiacs   RIGHT COLECTOMY  05/29/2006   Laparoscopic assisted   RIGHT HEART CATH N/A 06/14/2021   Procedure: RIGHT HEART CATH;  Surgeon: Cherrie Toribio SAUNDERS, MD;  Location: [facility];  Service: Cardiovascular;  Laterality: N/A;   TEE WITHOUT CARDIOVERSION N/A 01/28/2018   Procedure: TRANSESOPHAGEAL ECHOCARDIOGRAM (TEE);  Surgeon: Lucas Dorise POUR, MD;  Location: [facility] OR;  Service: Open Heart Surgery;  Laterality: N/A;   TONSILLECTOMY       Home Medications:  Prior to Admission medications   Medication Sig Start Date End Date Taking? Authorizing Provider  Alpha-Lipoic Acid 600 MG TABS Take 1,200 mg by mouth daily.   Yes [provider]  apixaban  (ELIQUIS ) 5 MG TABS tablet Take 5 mg  by mouth 2 (two) times daily.   Yes [provider]  atorvastatin  (LIPITOR ) 80 MG tablet TAKE ONE TABLET AT SIX IN THE EVENING. 08/26/23  Yes Court Dorn PARAS, MD  cholecalciferol  (VITAMIN D ) 1000 UNITS tablet Take 1,000 Units by mouth daily.   Yes [provider]  cilostazol  (PLETAL ) 50 MG tablet Take 1 tablet (50 mg total) by mouth 2 (two) times daily. 11/19/23  Yes Court Dorn PARAS, MD  clopidogrel  (PLAVIX ) 75 MG tablet TAKE ONE TAB DAILY WITH BREAKFAST 09/24/23  Yes Court Dorn PARAS, MD  Coenzyme Q10 200 MG capsule Take 200 mg by mouth daily.    Yes [provider]  Cyanocobalamin  (VITAMIN B 12 PO) Take 200 mcg by mouth daily.   Yes [provider]  dapagliflozin  propanediol (FARXIGA ) 10 MG TABS tablet Take 1 tablet (10  mg total) by mouth daily. 09/12/23  Yes Court Dorn PARAS, MD  Flaxseed, Linseed, 1000 MG CAPS Take 1,000 mg by mouth daily.   Yes [provider]  furosemide  (LASIX ) 40 MG tablet Take 1 tablet (40 mg total) by mouth 2 (two) times daily. 09/13/23  Yes Court Dorn PARAS, MD  Magnesium  Citrate 200 MG TABS Take 400 mg by mouth daily in the afternoon.   Yes [provider]  metolazone  (ZAROXOLYN ) 2.5 MG tablet Take 1 tablet by mouth on Monday, Wednesday, and Friday 04/22/23  Yes Court Dorn PARAS, MD  Polyethyl Glycol-Propyl Glycol (SYSTANE) 0.4-0.3 % GEL ophthalmic gel Place 1 application. into both eyes every 8 (eight) hours as needed (dry eyes).   Yes [provider]  potassium chloride  SA (KLOR-CON  M) 20 MEQ tablet Take 1 tablet (20 mEq total) by mouth 2 (two) times daily. 05/17/23  Yes Cottie Donnice PARAS, MD  pyridoxine (B-6) 200 MG tablet Take 200 mg by mouth daily.   Yes [provider]  spironolactone  (ALDACTONE ) 25 MG tablet Take 1 tablet (25 mg total) by mouth daily. 05/30/23 12/09/23 Yes Zenaida Morene PARAS, MD  vitamin C (ASCORBIC ACID) 500 MG tablet Take 500 mg by mouth 3 (three) times daily.    Yes [provider]  Zinc 50 MG TABS Take 1 tablet by mouth daily.   Yes [provider]  lisinopril  (PRINIVIL ,ZESTRIL ) 20 MG tablet Take 1 tablet (20 mg total) by mouth daily. HOLD UNTIL YOUR APPT WITH DR KATHERYN Patient not taking: No sig reported 02/11/18 01/25/20  Maxie Herlene POUR, PA-C    Scheduled Meds:  atorvastatin   80 mg Oral Daily   furosemide   40 mg Oral BID   sodium chloride  flush  3 mL Intravenous Q12H   Continuous Infusions:  sodium chloride      PRN Meds: sodium chloride , sodium chloride  flush  Allergies:   No Known Allergies  Social History:   Social History   Socioeconomic History   Marital status: Married    Spouse name: [name]   Number of children: 2   Years of education: Not on file   Highest education level:  Bachelor's degree (e.g., BA, AB, BS)  Occupational History   Occupation: retired    Associate Professor: RETIRED  Tobacco Use   Smoking status: Former    Current packs/day: 0.00    Types: Cigarettes    Start date: 11/26/1963    Quit date: 11/25/1993    Years since quitting: 30.0   Smokeless tobacco: Never  Vaping Use   Vaping status: Never Used  Substance and Sexual Activity   Alcohol  use: Yes    Alcohol /week:  1.0 standard drink of alcohol     Types: 1 Cans of beer per week   Drug use: No   Sexual activity: Not on file  Other Topics Concern   Not on file  Social History Narrative   Not on file   Social Drivers of Health   Financial Resource Strain: Low Risk  (04/08/2018)   Overall Financial Resource Strain (CARDIA)    Difficulty of Paying Living Expenses: Not hard at all  Food Insecurity: No Food Insecurity (08/26/2022)   Hunger Vital Sign    Worried About Running Out of Food in the Last Year: Never true    Ran Out of Food in the Last Year: Never true  Transportation Needs: No Transportation Needs (08/26/2022)   PRAPARE - Administrator, Civil Service (Medical): No    Lack of Transportation (Non-Medical): No  Physical Activity: Unknown (04/08/2018)   Exercise Vital Sign    Days of Exercise per Week: 3 days    Minutes of Exercise per Session: Not asked  Stress: Stress Concern Present (04/08/2018)   [employer] of Occupational Health - Occupational Stress Questionnaire    Feeling of Stress : To some extent  Social Connections: Not on file  Intimate Partner Violence: Not At Risk (08/26/2022)   Humiliation, Afraid, Rape, and Kick questionnaire    Fear of Current or Ex-Partner: No    Emotionally Abused: No    Physically Abused: No    Sexually Abused: No    Family History:    Family History  Problem Relation Age of Onset   Heart disease Father 72   Heart disease Paternal Grandmother    Heart disease Mother    Hypertension Mother    Healthy Sister    Heart disease  Paternal Grandfather    Hypertension Paternal Grandfather    Colon cancer Neg Hx      ROS:  Please see the history of present illness.  All other ROS reviewed and negative.     Physical Exam/Data: Vitals:   12/09/23 1315 12/09/23 1330 12/09/23 1550 12/09/23 1611  BP: (!) 155/62 (!) 155/107    Pulse: (!) 48 (!) 48 (!) 40   Resp: (!) 22 16 (!) 22   Temp:    98.1 F (36.7 C)  TempSrc:    Oral  SpO2: 100% 100% 98%   Weight:      Height:        Intake/Output Summary (Last 24 hours) at 12/09/2023 1620 Last data filed at 12/09/2023 1015 Gross per 24 hour  Intake 1000 ml  Output --  Net 1000 ml      12/09/2023    6:36 AM 11/14/2023    8:14 AM 07/08/2023   10:25 AM  Last 3 Weights  Weight (lbs) 194 lb 195 lb 6.4 oz 200 lb 12.8 oz  Weight (kg) 87.998 kg 88.633 kg 91.082 kg     Body mass index is 24.25 kg/m.  General: Well developed, well nourished, in no acute distress. Head: Normocephalic, atraumatic, sclera non-icteric, no xanthomas, nares are without discharge. Neck: Negative for carotid bruits. JVP not elevated. Lungs: Clear bilaterally to auscultation without wheezes, rales, or rhonchi. Breathing is unlabored. Heart: RRR S1 S2 without murmurs, rubs, or gallops.  Abdomen: Soft, non-tender, non-distended with normoactive bowel sounds. No rebound/guarding. Extremities: No clubbing or cyanosis. Mild BLE edema with marked chronic venous stasis and psoriatic changes - pt reports appears stable. Distal pedal pulses in tact Neuro: Alert and oriented X 3. Moves all  extremities spontaneously. Psych:  Responds to questions appropriately with a normal affect.   EKG:  The EKG was personally reviewed and demonstrates:  atrial fibrillation 47bpm, RBBB + LAFB Telemetry:  Telemetry was personally reviewed and demonstrates:  AF HR upper 30s-50s without prolonged pauses  Relevant CV Studies: 2d echo 08/2022  1. Patient bradycardic with HR 30-low 40s during exam.   2. Left ventricular  ejection fraction, by estimation, is 55 to 60%. The  left ventricle has normal function. The left ventricle has no regional  wall motion abnormalities. There is mild left ventricular hypertrophy.  Left ventricular diastolic parameters  are indeterminate.   3. Right ventricular systolic function is mildly reduced. The right  ventricular size is mildly enlarged. There is moderately elevated  pulmonary artery systolic pressure. The estimated right ventricular  systolic pressure is 53.9 mmHg.   4. Left atrial size was moderately dilated.   5. Right atrial size was severely dilated.   6. The mitral valve is degenerative. Trivial mitral valve regurgitation.   7. Tricuspid valve regurgitation is moderate.   8. The aortic valve is tricuspid. There is mild calcification of the  aortic valve. There is mild thickening of the aortic valve. Aortic valve  regurgitation is not visualized. Aortic valve sclerosis/calcification is  present, without any evidence of  aortic stenosis.   9. Aortic dilatation noted. There is borderline dilatation of the aortic  root, measuring 40 mm.  10. The inferior vena cava is dilated in size with <50% respiratory  variability, suggesting right atrial pressure of 15 mmHg.  Comparison(s): Compared to prior TTE in 12/2020, the patient is now  bradycardic with HR 30-40s. Hyperdynamic LV function is no longer present  but EF still within normal range.   Laboratory Data: High Sensitivity Troponin:   Recent Labs  Lab 12/09/23 0653 12/09/23 0901 12/09/23 1319  TROPONINIHS 24* 49* 85*     Chemistry Recent Labs  Lab 12/09/23 0653 12/09/23 0701  NA 136 137  K 3.9 3.9  CL 100 100  CO2 24  --   GLUCOSE 116* 115*  BUN 31* 32*  CREATININE 1.29* 1.30*  CALCIUM  9.5  --   GFRNONAA 56*  --   ANIONGAP 12  --     Recent Labs  Lab 12/09/23 0653  PROT 6.6  ALBUMIN  4.1  AST 36  ALT 35  ALKPHOS 71  BILITOT 1.1   Lipids No results for input(s): CHOL, TRIG,  HDL, LABVLDL, LDLCALC, CHOLHDL in the last 168 hours.  Hematology Recent Labs  Lab 12/09/23 0653 12/09/23 0701  WBC 7.6  --   RBC 4.42  --   HGB 14.3 13.6  HCT 42.7 40.0  MCV 96.6  --   MCH 32.4  --   MCHC 33.5  --   RDW 13.7  --   PLT 175  --    Thyroid  No results for input(s): TSH, FREET4 in the last 168 hours.  BNP Recent Labs  Lab 12/09/23 0652  BNP 63.5    DDimer No results for input(s): DDIMER in the last 168 hours.  Radiology/Studies:  DG Humerus Right Result Date: 12/09/2023 EXAM: 1 VIEW XRAY OF THE RIGHT HUMERUS 12/09/2023 08:14:00 AM COMPARISON: None available. CLINICAL HISTORY: Fall. Pt arrives via EMS for mechanical fall from home. FINDINGS: BONES AND JOINTS: No acute fracture. No focal osseous lesion. No joint dislocation. SOFT TISSUES: The soft tissues are unremarkable. IMPRESSION: 1. No evidence of acute traumatic injury. 2. Normal humerus radiographs. Electronically signed by:  Waddell Calk MD 12/09/2023 08:25 AM EDT RP Workstation: [id number]   CT CHEST ABDOMEN PELVIS W CONTRAST Result Date: 12/09/2023 EXAM: CT CHEST, ABDOMEN AND PELVIS WITH CONTRAST 12/09/2023 07:15:30 AM TECHNIQUE: CT of the chest, abdomen and pelvis was performed with the administration of intravenous contrast. Multiplanar reformatted images are provided for review. Automated exposure control, iterative reconstruction, and/or weight based adjustment of the mA/kV was utilized to reduce the radiation dose to as low as reasonably achievable. COMPARISON: CT angio chest 04/05/2021. CLINICAL HISTORY: Polytrauma, blunt. FINDINGS: CHEST: MEDIASTINUM AND LYMPH NODES: Status post median sternotomy and CABG procedure. Heart size normal. No pericardial effusion. LUNGS AND PLEURA: Mild changes of emphysema. No pleural fluid, interstitial edema or pneumothorax. Diffuse bronchial wall thickening identified. Calcified granuloma noted in the left lung base. There is persistent focal area of ground-glass  attenuation in the central left upper lobe without solid component measuring 3.6 cm, image 87/9. On the previous exam this measured the same. ABDOMEN AND PELVIS: LIVER: Contour of the liver is slightly irregular which may reflect changes of early cirrhosis. GALLBLADDER AND BILE DUCTS: 8 mm gallstone. No gallbladder wall thickening or inflammation. SPLEEN: No acute abnormality. PANCREAS: No acute abnormality. ADRENAL GLANDS: No acute abnormality. KIDNEYS, URETERS AND BLADDER: No stones in the kidneys or ureters. No hydronephrosis. No perinephric or periureteral stranding. Urinary bladder is unremarkable. GI AND BOWEL: Mild stool burden noted within the colon and rectum. Status post right colectomy. There is no bowel obstruction. REPRODUCTIVE ORGANS: Prostate gland appears normal. PERITONEUM AND RETROPERITONEUM: No ascites. No free air. VASCULATURE: Aortic atherosclerotic calcification. ABDOMINAL AND PELVIS LYMPH NODES: No lymphadenopathy. BONES AND SOFT TISSUES: Lumbar spondylosis. No signs of acute fracture. IMPRESSION: 1. No evidence of acute traumatic injury within the chest, abdomen, or pelvis. . 2. Persistent ground glass attenuating nodule within the central left upper lobe without solid component, measuring 3.6 cm, unchanged from the previous exam. Follow-up imaging in 12 months with repeat CT of the chest is recommended. 3. Slightly irregular liver contour, possibly reflecting early cirrhosis. 4. 8 mm gallstone. No gallbladder wall thickening or inflammation. 5. Status post right colectomy. Mild stool burden noted within the colon and rectum. 6. Aortic atherosclerotic calcification. 7. Mild emphysema. Electronically signed by: Waddell Calk MD 12/09/2023 07:36 AM EDT RP Workstation: [id number]   CT Head Wo Contrast Result Date: 12/09/2023 EXAM: CT HEAD AND CERVICAL SPINE 12/09/2023 06:56:34 AM TECHNIQUE: CT of the head and cervical spine was performed without the administration of intravenous contrast.  Multiplanar reformatted images are provided for review. Automated exposure control, iterative reconstruction, and/or weight based adjustment of the mA/kV was utilized to reduce the radiation dose to as low as reasonably achievable. COMPARISON: CT head 07/03/2023 and CT cervical spine 01/28/2013. CLINICAL HISTORY: Facial trauma, blunt. No contrast. Pt arrives via ems for mechanical fall from home. Unsure what he fell on but possible furniture. Complaints of head pain where laceration is. Pt reports almost LOC, saw stars but was conscious the whole time; Approx 4in hematoma to back of head, on eliquis . Has numbness on legs intermittent that is normal, denies neck pain. FINDINGS: CT HEAD BRAIN AND VENTRICLES: No acute intracranial hemorrhage. No mass effect or midline shift. No abnormal extra-axial fluid collection. No evidence of acute infarct. No hydrocephalus. Hypoattenuating foci in the cerebral white matter, most likely representing chronic small vessel disease. Prominence of the sulci and ventricles compatible with brain atrophy. ORBITS: No acute abnormality. SINUSES AND MASTOIDS: No acute abnormality. SOFT TISSUES AND SKULL: No acute  skull fracture. Mild subcutaneous soft tissue stranding overlying the posterior scalp compatible with superficial contusion. CT CERVICAL SPINE BONES AND ALIGNMENT: No acute fracture. Retrolisthesis of C4 on C5 measures 3 mm. DEGENERATIVE CHANGES: Multilevel disc space narrowing and endplate spurring identified within the thoracic spine. This is most severe at C4-5 and C6-7 . SOFT TISSUES: No prevertebral soft tissue swelling. IMPRESSION: 1. No acute intracranial abnormality. 2. Chronic small vessel ischemic disease. 3. Cerebral atrophy. 4. Mild subcutaneous soft tissue stranding overlying the posterior scalp compatible with superficial contusion. 5. No acute fracture or traumatic malalignment of the cervical spine. 6. Retrolisthesis of C4 on C5 (3 mm). 7. Multilevel cervical  degenerative disc disease. Electronically signed by: Waddell Calk MD 12/09/2023 07:13 AM EDT RP Workstation: [id number]   CT Cervical Spine Wo Contrast Result Date: 12/09/2023 EXAM: CT HEAD AND CERVICAL SPINE 12/09/2023 06:56:34 AM TECHNIQUE: CT of the head and cervical spine was performed without the administration of intravenous contrast. Multiplanar reformatted images are provided for review. Automated exposure control, iterative reconstruction, and/or weight based adjustment of the mA/kV was utilized to reduce the radiation dose to as low as reasonably achievable. COMPARISON: CT head 07/03/2023 and CT cervical spine 01/28/2013. CLINICAL HISTORY: Facial trauma, blunt. No contrast. Pt arrives via ems for mechanical fall from home. Unsure what he fell on but possible furniture. Complaints of head pain where laceration is. Pt reports almost LOC, saw stars but was conscious the whole time; Approx 4in hematoma to back of head, on eliquis . Has numbness on legs intermittent that is normal, denies neck pain. FINDINGS: CT HEAD BRAIN AND VENTRICLES: No acute intracranial hemorrhage. No mass effect or midline shift. No abnormal extra-axial fluid collection. No evidence of acute infarct. No hydrocephalus. Hypoattenuating foci in the cerebral white matter, most likely representing chronic small vessel disease. Prominence of the sulci and ventricles compatible with brain atrophy. ORBITS: No acute abnormality. SINUSES AND MASTOIDS: No acute abnormality. SOFT TISSUES AND SKULL: No acute skull fracture. Mild subcutaneous soft tissue stranding overlying the posterior scalp compatible with superficial contusion. CT CERVICAL SPINE BONES AND ALIGNMENT: No acute fracture. Retrolisthesis of C4 on C5 measures 3 mm. DEGENERATIVE CHANGES: Multilevel disc space narrowing and endplate spurring identified within the thoracic spine. This is most severe at C4-5 and C6-7 . SOFT TISSUES: No prevertebral soft tissue swelling. IMPRESSION: 1.  No acute intracranial abnormality. 2. Chronic small vessel ischemic disease. 3. Cerebral atrophy. 4. Mild subcutaneous soft tissue stranding overlying the posterior scalp compatible with superficial contusion. 5. No acute fracture or traumatic malalignment of the cervical spine. 6. Retrolisthesis of C4 on C5 (3 mm). 7. Multilevel cervical degenerative disc disease. Electronically signed by: Waddell Calk MD 12/09/2023 07:13 AM EDT RP Workstation: [id number]   DG Chest Portable 1 View Result Date: 12/09/2023 CLINICAL DATA:  Fall injury with blunt polytrauma. EXAM: PORTABLE CHEST 1 VIEW RIGHT SHOULDER 3 VIEWS LEFT ELBOW 2 VIEWS PORTABLE PELVIS 1 VIEW COMPARISON:  Chest AP portable 08/26/2022, right shoulder series 05/07/2010, CT abdomen pelvis 05/26/2009. FINDINGS: Chest AP portable 6:38 a.m.: There is a tangle of overlying telemetry wiring. Intact midline sternotomy sutures with old CABG. Stable mild cardiomegaly. There is a left atrial appendage clip. No vascular congestion is seen. The mediastinum is stable. There is calcification in the aortic arch. The lungs are mildly emphysematous but clear. Osteopenia with no acute osseous findings. Right shoulder three views: Mild osteopenia. No evidence of fracture or dislocation. There is narrowing at the [facility] joint, moderate inferior AC joint osteophytes.  The glenohumeral joint is unremarkable. No displaced fracture of the right upper ribcage. Soft tissues are unremarkable. AP and lateral left elbow: Normal bone mineralization. No evidence of fracture, dislocation or joint effusion. Narrowing and spurring is noted on the medial trochleoulnar joint. Arthritic changes are not seen at the radiocapitellar joint. There is no other focal bone abnormality. Soft tissues are unremarkable. Portable pelvis: No evidence of pelvic fracture or diastasis. There is mild nonerosive arthrosis at the SI joints and hips. No other focal bone abnormality. There are marginal osteophytes in the  lower lumbar spine. There are bilateral common iliac arterial stents with heavy iliofemoral calcific arteriosclerosis. Postsurgical changes prior ileocecectomy. IMPRESSION: 1. No evidence of acute chest disease. 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 2. No evidence of right shoulder fracture or dislocation. AC joint DJD. 3. No evidence of left elbow fracture or dislocation. 4. No evidence of pelvic fracture or diastasis. 5. Aortic and iliofemoral atherosclerosis. 6. Bilateral common iliac arterial stents. Electronically Signed   By: Francis Quam M.D.   On: 12/09/2023 07:08   DG Pelvis Portable Result Date: 12/09/2023 CLINICAL DATA:  Fall injury with blunt polytrauma. EXAM: PORTABLE CHEST 1 VIEW RIGHT SHOULDER 3 VIEWS LEFT ELBOW 2 VIEWS PORTABLE PELVIS 1 VIEW COMPARISON:  Chest AP portable 08/26/2022, right shoulder series 05/07/2010, CT abdomen pelvis 05/26/2009. FINDINGS: Chest AP portable 6:38 a.m.: There is a tangle of overlying telemetry wiring. Intact midline sternotomy sutures with old CABG. Stable mild cardiomegaly. There is a left atrial appendage clip. No vascular congestion is seen. The mediastinum is stable. There is calcification in the aortic arch. The lungs are mildly emphysematous but clear. Osteopenia with no acute osseous findings. Right shoulder three views: Mild osteopenia. No evidence of fracture or dislocation. There is narrowing at the [facility] joint, moderate inferior AC joint osteophytes. The glenohumeral joint is unremarkable. No displaced fracture of the right upper ribcage. Soft tissues are unremarkable. AP and lateral left elbow: Normal bone mineralization. No evidence of fracture, dislocation or joint effusion. Narrowing and spurring is noted on the medial trochleoulnar joint. Arthritic changes are not seen at the radiocapitellar joint. There is no other focal bone abnormality. Soft tissues are unremarkable. Portable pelvis: No evidence of pelvic fracture or diastasis. There is  mild nonerosive arthrosis at the SI joints and hips. No other focal bone abnormality. There are marginal osteophytes in the lower lumbar spine. There are bilateral common iliac arterial stents with heavy iliofemoral calcific arteriosclerosis. Postsurgical changes prior ileocecectomy. IMPRESSION: 1. No evidence of acute chest disease. 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 2. No evidence of right shoulder fracture or dislocation. AC joint DJD. 3. No evidence of left elbow fracture or dislocation. 4. No evidence of pelvic fracture or diastasis. 5. Aortic and iliofemoral atherosclerosis. 6. Bilateral common iliac arterial stents. Electronically Signed   By: Francis Quam M.D.   On: 12/09/2023 07:08   DG Shoulder Right Result Date: 12/09/2023 CLINICAL DATA:  Fall injury with blunt polytrauma. EXAM: PORTABLE CHEST 1 VIEW RIGHT SHOULDER 3 VIEWS LEFT ELBOW 2 VIEWS PORTABLE PELVIS 1 VIEW COMPARISON:  Chest AP portable 08/26/2022, right shoulder series 05/07/2010, CT abdomen pelvis 05/26/2009. FINDINGS: Chest AP portable 6:38 a.m.: There is a tangle of overlying telemetry wiring. Intact midline sternotomy sutures with old CABG. Stable mild cardiomegaly. There is a left atrial appendage clip. No vascular congestion is seen. The mediastinum is stable. There is calcification in the aortic arch. The lungs are mildly emphysematous but clear. Osteopenia with  no acute osseous findings. Right shoulder three views: Mild osteopenia. No evidence of fracture or dislocation. There is narrowing at the [facility] joint, moderate inferior AC joint osteophytes. The glenohumeral joint is unremarkable. No displaced fracture of the right upper ribcage. Soft tissues are unremarkable. AP and lateral left elbow: Normal bone mineralization. No evidence of fracture, dislocation or joint effusion. Narrowing and spurring is noted on the medial trochleoulnar joint. Arthritic changes are not seen at the radiocapitellar joint. There is no other focal  bone abnormality. Soft tissues are unremarkable. Portable pelvis: No evidence of pelvic fracture or diastasis. There is mild nonerosive arthrosis at the SI joints and hips. No other focal bone abnormality. There are marginal osteophytes in the lower lumbar spine. There are bilateral common iliac arterial stents with heavy iliofemoral calcific arteriosclerosis. Postsurgical changes prior ileocecectomy. IMPRESSION: 1. No evidence of acute chest disease. 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 2. No evidence of right shoulder fracture or dislocation. AC joint DJD. 3. No evidence of left elbow fracture or dislocation. 4. No evidence of pelvic fracture or diastasis. 5. Aortic and iliofemoral atherosclerosis. 6. Bilateral common iliac arterial stents. Electronically Signed   By: Francis Quam M.D.   On: 12/09/2023 07:08   DG Elbow 2 Views Left Result Date: 12/09/2023 CLINICAL DATA:  Fall injury with blunt polytrauma. EXAM: PORTABLE CHEST 1 VIEW RIGHT SHOULDER 3 VIEWS LEFT ELBOW 2 VIEWS PORTABLE PELVIS 1 VIEW COMPARISON:  Chest AP portable 08/26/2022, right shoulder series 05/07/2010, CT abdomen pelvis 05/26/2009. FINDINGS: Chest AP portable 6:38 a.m.: There is a tangle of overlying telemetry wiring. Intact midline sternotomy sutures with old CABG. Stable mild cardiomegaly. There is a left atrial appendage clip. No vascular congestion is seen. The mediastinum is stable. There is calcification in the aortic arch. The lungs are mildly emphysematous but clear. Osteopenia with no acute osseous findings. Right shoulder three views: Mild osteopenia. No evidence of fracture or dislocation. There is narrowing at the [facility] joint, moderate inferior AC joint osteophytes. The glenohumeral joint is unremarkable. No displaced fracture of the right upper ribcage. Soft tissues are unremarkable. AP and lateral left elbow: Normal bone mineralization. No evidence of fracture, dislocation or joint effusion. Narrowing and spurring is  noted on the medial trochleoulnar joint. Arthritic changes are not seen at the radiocapitellar joint. There is no other focal bone abnormality. Soft tissues are unremarkable. Portable pelvis: No evidence of pelvic fracture or diastasis. There is mild nonerosive arthrosis at the SI joints and hips. No other focal bone abnormality. There are marginal osteophytes in the lower lumbar spine. There are bilateral common iliac arterial stents with heavy iliofemoral calcific arteriosclerosis. Postsurgical changes prior ileocecectomy. IMPRESSION: 1. No evidence of acute chest disease. 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 2. No evidence of right shoulder fracture or dislocation. AC joint DJD. 3. No evidence of left elbow fracture or dislocation. 4. No evidence of pelvic fracture or diastasis. 5. Aortic and iliofemoral atherosclerosis. 6. Bilateral common iliac arterial stents. Electronically Signed   By: Francis Quam M.D.   On: 12/09/2023 07:08     Assessment and Plan:  1. Loss of balance, fall, possible orthostasis - patient is very clear that he lost his balance and fell backwards without preceding dizziness or any loss of consciousness, but knows that he hit the ground hard - VS notable for drop in BP to 99 systolic, may be contributing - has also had significant unintentional weight loss in 10 months' time perhaps decreasing his threshold  for diuretic burden - has had permanent AF with HR 30s-40s in the past, not previously felt to require PPM - can consider EP consultation in AM - obtain f/u echocardiogram - hold spironolactone , metolazone  for now - will review further with MD  2. Permanent atrial fibrillation with chronic bradycardia, RBBB, LAFB - as above - anticipate continuation of Eliquis  when OK with primary team (they are considering MRI per incomplete note) - TSH pending  3. CAD s/p remote PCI 1990s, CABG 2019 with low level troponin elevation - hsTroponins 24->49->85->77, do not  suspect ACS - no recent CP, SOB - exercise capacity has been stable per patient - on statin therapy - f/u echocardiogram  4. PAD - will await input from MD regarding whether Plavix  can be discontinued in setting of concomitant Pletal  / Eliquis  therapy - otherwise anticipate OP follow-up  5. Chronic HFpEF with pHTN, tricuspid regurgitation, venous insufficiency - hold metolazone , spironolactone  for now - on Lasix  40mg  BID - f/u echo pending  6. 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 OSA - would continue BiPAP at bedtime when admission orders are placed by primary team, adherent to regimen  Risk Assessment/Risk Scores:       [place]  Heart Association (NYHA) Functional Class NYHA Class II  CHA2DS2-VASc Score = 5   This indicates a 7.2% annual risk of stroke. The patient's score is based upon: CHF History: 1 HTN History: 1 Diabetes History: 0 Stroke History: 0 Vascular Disease History: 1 Age Score: 2 Gender Score: 0     For questions or updates, please contact [facility] Please consult [email or web address] for contact info under      Signed, [place] Liz N Mckinzie Saksa, PA-C  12/09/2023 4:20 PM

## 2023-12-09 NOTE — ED Notes (Signed)
 Patient transported to CT

## 2023-12-09 NOTE — ED Provider Notes (Signed)
  [place] EMERGENCY DEPARTMENT AT [facility] Provider Assume Care Note I assumed care of [name] on 12/09/2023 at 7 AM from Dr. Carita.   Briefly, [name] is a 83 y.o. male who: PMHx: CAD with stents, PVD, CAD, CABG, sleep apnea, atrial fibrillation on Plavix  and Eliquis , COPD, hypertension, hyperlipidemia.  P/w ground-level fall with headache and right shoulder pain, abrasions however no lacerations requiring wound repair  Plan at the time of handoff: Follow-up CT imaging and troponin   Please refer to the original provider's note for additional information regarding the care of [name].  Reassessment: I personally reassessed the patient: Patient without any acute complaints or additional questions.  Vital Signs:  ED Triage Vitals  Encounter Vitals Group     BP 12/09/23 0632 (!) 141/60     [name] --      [name] Diastolic BP Keller --      Boys Systolic BP Keller --      Boys Diastolic BP Keller --      Pulse Rate 12/09/23 0641 (!) 52     Resp 12/09/23 0639 20     Temp 12/09/23 0632 97.6 F (36.4 C)     Temp Source 12/09/23 0632 Oral     SpO2 12/09/23 0634 97 %     Weight 12/09/23 0636 194 lb (88 kg)     Height 12/09/23 0636 6' 3 (1.905 m)     Head Circumference --      Peak Flow --      Pain Score 12/09/23 0636 0     Pain Loc --      Pain Education --      Exclude from Growth Chart --      Hemodynamics:  The patient is hemodynamically stable. Mental Status:  The patient is alert  Additional MDM: CT imaging reassuring against acute traumatic finding, however patient with elevated troponin, 24 initially, and uptrending to 49.  On reassessment, patient is without chest pain.  Discussed fall, patient reports that he was putting on a shirt, however does not describe a clear trip and fall mechanism, additionally does not have any prodromal symptoms, therefore presentation in combination with elevated  troponin is concerning for cardiac syncope/high risk syncope, therefore do feel that patient warrants admission for further workup.  Cardiology consulted, discussed patient with Carley, PA on-call for cardiology who states that they will evaluate the patient.  Additionally hospitalist consulted, and accepted for admission by Dr. Tobie.  No additional acute events while under my care.  Disposition: ADMIT: I believe the patient requires admission for further care and management. The patient was admitted to medicine with a cardiology consult. Please see inpatient provider note for additional treatment plan details.    FREDRIK CANDIE Later, MD Emergency Medicine    Later Jerilynn RAMAN, MD 12/09/23 [phone]

## 2023-12-09 NOTE — Assessment & Plan Note (Signed)
 Will continue patient on his home regimen of Lasix  with strict I's and O's and daily weights. Last Weight  Most recent update: 12/09/2023  6:37 AM    Weight  88 kg (194 lb)

## 2023-12-09 NOTE — ED Provider Notes (Signed)
 [place] EMERGENCY DEPARTMENT AT [facility] Provider Note   CSN: [id number] Arrival date & time: 12/09/23  9372     Patient presents with: [name] is a 83 y.o. male.   Patient from home after falling.  Does have a history of CAD with stents,PVD,  CAD CABG, sleep apnea, atrial fibrillation on Plavix  and Eliquis , COPD, hypertension, hyperlipidemia. States he was trying to put his shirt on this morning when he lost his balance and fell and of his right side.  Believes he hit his head and his right shoulder.  Denies loss of consciousness but states I saw stars.  No vomiting.  No midline neck or back pain.  Complains of pain to right shoulder and upper arm as well as scalp hematoma.  Multiple skin tears to bilateral upper extremities.  Does take Eliquis .  Denies chest pain or shortness of breath.  Denies abdominal pain.  Denies low back pain.  Denies any room spinning dizziness currently.  No lightheadedness.  Does take Eliquis  and Plavix .  The history is provided by the patient and the EMS personnel.  Fall Associated symptoms include headaches. Pertinent negatives include no chest pain, no abdominal pain and no shortness of breath.       Prior to Admission medications   Medication Sig Start Date End Date Taking? Authorizing Provider  Alpha-Lipoic Acid 600 MG TABS Take 1,200 mg by mouth daily.    [provider]  atorvastatin  (LIPITOR ) 80 MG tablet TAKE ONE TABLET AT SIX IN THE EVENING. 08/26/23   Court Dorn PARAS, MD  cholecalciferol  (VITAMIN D ) 1000 UNITS tablet Take 1,000 Units by mouth daily.    [provider]  cilostazol  (PLETAL ) 50 MG tablet Take 1 tablet (50 mg total) by mouth 2 (two) times daily. 11/19/23   Court Dorn PARAS, MD  clopidogrel  (PLAVIX ) 75 MG tablet TAKE ONE TAB DAILY WITH BREAKFAST 09/24/23   Court Dorn PARAS, MD  Coenzyme Q10 200 MG capsule Take 200 mg by mouth daily.     [provider]  Cyanocobalamin  (VITAMIN B  12 PO) Take 200 mcg by mouth daily.    [provider]  dapagliflozin  propanediol (FARXIGA ) 10 MG TABS tablet Take 1 tablet (10 mg total) by mouth daily. 09/12/23   Court Dorn PARAS, MD  ELIQUIS  5 MG TABS tablet TAKE 1 TABLET BY MOUTH TWICE DAILY. 11/19/23   Court Dorn PARAS, MD  Flaxseed, Linseed, 1000 MG CAPS Take 1,000 mg by mouth daily.    [provider]  furosemide  (LASIX ) 40 MG tablet Take 1 tablet (40 mg total) by mouth 2 (two) times daily. 09/13/23   Court Dorn PARAS, MD  Magnesium  Citrate 200 MG TABS Take 400 mg by mouth daily in the afternoon.    [provider]  metolazone  (ZAROXOLYN ) 2.5 MG tablet Take 1 tablet by mouth on Monday, Wednesday, and Friday 04/22/23   Court Dorn PARAS, MD  Polyethyl Glycol-Propyl Glycol (SYSTANE) 0.4-0.3 % GEL ophthalmic gel Place 1 application. into both eyes every 8 (eight) hours as needed (dry eyes).    [provider]  potassium chloride  SA (KLOR-CON  M) 20 MEQ tablet Take 1 tablet (20 mEq total) by mouth 2 (two) times daily. 05/17/23   Cottie Donnice PARAS, MD  pyridoxine (B-6) 200 MG tablet Take 200 mg by mouth daily.    [provider]  spironolactone  (ALDACTONE ) 25 MG tablet Take 1 tablet (25 mg total) by mouth daily. 05/30/23 11/14/23  Zenaida Morene PARAS, MD  vitamin C (ASCORBIC ACID) 500 MG tablet Take 500 mg by mouth 3 (three) times daily.     [provider]  Zinc 50 MG TABS Take 1 tablet by mouth daily.    [provider]  lisinopril  (PRINIVIL ,ZESTRIL ) 20 MG tablet Take 1 tablet (20 mg total) by mouth daily. HOLD UNTIL YOUR APPT WITH DR KATHERYN Patient not taking: No sig reported 02/11/18 01/25/20  Kilroy, Luke K, PA-C    Allergies: Patient has no known allergies.    Review of Systems  Constitutional:  Negative for activity change, appetite change and fever.  HENT:  Negative for congestion and rhinorrhea.   Respiratory:  Negative for cough, chest tightness and shortness of breath.    Cardiovascular:  Negative for chest pain.  Gastrointestinal:  Negative for abdominal pain, nausea and vomiting.  Genitourinary:  Negative for dysuria.  Musculoskeletal:  Positive for arthralgias and myalgias.  Skin:  Negative for rash.  Neurological:  Positive for light-headedness and headaches. Negative for dizziness and weakness.   all other systems are negative except as noted in the HPI and PMH.    Updated Vital Signs BP (!) 141/60 (BP Location: Left Arm)   Pulse (!) 52   Temp 97.6 F (36.4 C) (Oral)   Resp 20   Ht 6' 3 (1.905 m)   Wt 88 kg   SpO2 100%   BMI 24.25 kg/m   Physical Exam Vitals and nursing note reviewed.  Constitutional:      General: He is not in acute distress.    Appearance: He is well-developed.  HENT:     Head: Normocephalic.     Comments: Occipital hematoma with overlying small abrasion and laceration.  Hemostatic    Mouth/Throat:     Pharynx: No oropharyngeal exudate.  Eyes:     Conjunctiva/sclera: Conjunctivae normal.     Pupils: Pupils are equal, round, and reactive to light.  Neck:     Comments: No midline C spine pain Cardiovascular:     Rate and Rhythm: Normal rate and regular rhythm.     Heart sounds: Normal heart sounds. No murmur heard. Pulmonary:     Effort: Pulmonary effort is normal. No respiratory distress.     Breath sounds: Normal breath sounds.  Abdominal:     Palpations: Abdomen is soft.     Tenderness: There is no abdominal tenderness. There is no guarding or rebound.  Musculoskeletal:        General: No tenderness. Normal range of motion.     Cervical back: Normal range of motion and neck supple.     Comments: No midline T or L spine pain.   Skin tear left elbow without bony deformity.  Abrasion right forearm without bony deformity.  Tender right anterior lateral shoulder without obvious deformity or dislocation.  Intact radial pulse.  Skin:    General: Skin is warm.  Neurological:     Mental Status: He is alert and  oriented to person, place, and time.     Cranial Nerves: No cranial nerve deficit.     Motor: No abnormal muscle tone.     Coordination: Coordination normal.     Comments:  5/5 strength throughout. CN 2-12 intact.Equal grip strength.   Psychiatric:        Behavior: Behavior normal.     (all labs ordered are listed, but only abnormal results are displayed) Labs Reviewed  I-STAT CHEM 8, ED - Abnormal; Notable for the following components:  Result Value   BUN 32 (*)    Creatinine, Ser 1.30 (*)    Glucose, Bld 115 (*)    All other components within normal limits  CBC  COMPREHENSIVE METABOLIC PANEL WITH GFR  ETHANOL  URINALYSIS, ROUTINE W REFLEX MICROSCOPIC  PROTIME-INR  BRAIN NATRIURETIC PEPTIDE  I-STAT CG4 LACTIC ACID, ED  SAMPLE TO BLOOD BANK  TROPONIN I (HIGH SENSITIVITY)    EKG: None  Radiology: No results found.   Procedures   Medications Ordered in the ED - No data to display  Clinical Course as of 12/09/23 0844  [facility] Dec 09, 2023  0743 Trauma, pending trop and CTCAP, eliquis  for afib, CAD, pain and head and R shoulder, hematoma w abrasion, no true lac [LS]    Clinical Course User Index [LS] Rogelia Jerilynn RAMAN, MD                                 Medical Decision Making Amount and/or Complexity of Data Reviewed Independent Historian: EMS Labs: ordered. Decision-making details documented in ED Course. Radiology: ordered and independent interpretation performed. Decision-making details documented in ED Course. ECG/medicine tests: ordered and independent interpretation performed. Decision-making details documented in ED Course.  Risk Prescription drug management.   Level 2 trauma.  Patient fell while attempting to put his shirt on and striking his head and his shoulder.  Possible loss of conscious.  Does take Eliquis .  GCS is 15, ABCs are intact.  Complains of pain to right shoulder and head only.  Multiple skin tears to upper extremities.  Will obtain  x-rays of right shoulder, chest, pelvis, CT head and C-spine.  CT head and C spine negative for acute traumatic injury. Chest, shoulder, humerus, elbow and pelvis xray negative.   EKG with slow Afib in 40s-50s. Patient with history of similar. He is not on a beta blocker and denies any dizziness or lightheadedness. Will check orthostatics.   Scalp wound cleaned and is not amenable to repair. Tetanus up to date.   CT chest, abdomen, pelvis pending at shift change. Troponin pending as well. Orthostatics pending. Care transferred to Dr. Rogelia.  Anticipate discharge if troponin and remainder of imaging is reassuring.      Final diagnoses:  None    ED Discharge Orders     None          Chin Wachter, Garnette, MD 12/09/23 [phone]

## 2023-12-09 NOTE — ED Notes (Signed)
 Pt. Back from CT.

## 2023-12-09 NOTE — ED Notes (Signed)
 Patient transported back from CT

## 2023-12-09 NOTE — Assessment & Plan Note (Signed)
 Bradycardia noted on the EKG.  Patient is currently on Eliquis  which we will continue.  Med rec is pending patient is on triple therapy with Eliquis  Pletal  and Plavix .  Will resume once med rec is available.

## 2023-12-09 NOTE — H&P (Addendum)
 History and Physical    Patient: [name] FMW:[id number] DOB: [birth year] DOA: 12/09/2023 DOS: the patient was seen and examined on 12/09/2023 . PCP: Dwight Trula SQUIBB, MD  Patient coming from: Home Chief complaint: Chief Complaint  Patient presents with   Fall   HPI:  [name] is a 83 y.o. 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  Patient denies dizziness states he has occasional alcohol , no smoking.  No chest pain palpitations nausea or vomiting.  ED Course:  Vital signs in the ED were notable for the following:  Vitals:   12/09/23 1550 12/09/23 1611 12/09/23 1813 12/09/23 1814  BP:    (!) 149/131  Pulse: (!) 40   (!) 50  Temp:  98.1 F (36.7 C)  98.4 F (36.9 C)  Resp: (!) 22   18  Height:      Weight:      SpO2: 98%  99% 96%  TempSrc:  Oral  Oral  BMI (Calculated):       >>ED evaluation thus far shows: EKG today shows A-fib at 47 with right bundle branch block QTc of 485. -CMP shows glucose of 116 BUN of 31 creatinine 1.29 eGFR 56 normal LFTs. - Initial troponin of 24/49/85/77. -Normal CBC.    >>While in the ED patient received the following: Medications  atorvastatin  (LIPITOR ) tablet 80 mg (has no administration in time range)  furosemide  (LASIX ) tablet 40 mg (has no administration in time range)  sodium chloride  flush (NS) 0.9 % injection 3 mL (has no administration in time range)  sodium chloride  flush (NS) 0.9 % injection 3 mL (has no administration in time range)  0.9 %  sodium chloride  infusion (has no administration in time range)  iohexol  (OMNIPAQUE ) 350 MG/ML injection 75 mL (75 mLs Intravenous Contrast Given 12/09/23 0710)  lactated ringers  bolus 1,000 mL (0 mLs Intravenous Stopped 12/09/23 1015)    Review of Systems  Musculoskeletal:        Pain in the back of the scalp where he struck his head.  Neurological:  Positive for loss of consciousness.   Past Medical History:  Diagnosis Date   Allergy    Seasonal--pollen   Bradycardia    CAD (coronary artery disease)    Carotid stenosis, asymptomatic    Chronic heart failure with preserved ejection fraction (HFpEF) (HCC)    Colon cancer (HCC) 04/2006   Stage III--T3 N1   COPD (chronic obstructive pulmonary disease) (HCC)    Hand foot syndrome    Hemorrhoids    Hyperlipidemia    Hypertension    Neuropathy    OSA treated with BiPAP    severe OSA with an AHI fo 45/hr and O2 sats as low as 85%. On auto BiPAP   perm    Permanent atrial fibrillation (HCC)    Psoriasis    Pulmonary hypertension (HCC)    Right bundle branch block (RBBB) with left anterior fascicular block (LAFB)    Tricuspid regurgitation    Tubular adenoma of colon    Past Surgical History:  Procedure Laterality Date   ABDOMINAL AORTOGRAM W/LOWER EXTREMITY N/A 12/21/2021   Procedure: ABDOMINAL AORTOGRAM W/LOWER EXTREMITY;  Surgeon: Court Dorn PARAS, MD;  Location: [facility] INVASIVE CV  LAB;  Service: Cardiovascular;  Laterality: N/A;   CARDIAC CATHETERIZATION     CLIPPING OF ATRIAL APPENDAGE  01/28/2018   Procedure: CLIPPING OF ATRIAL APPENDAGE;  Surgeon: Lucas Dorise POUR, MD;  Location: [facility] OR;  Service: Open Heart Surgery;;   COLONOSCOPY     CORONARY ARTERY BYPASS GRAFT N/A 01/28/2018   Procedure: CORONARY ARTERY BYPASS GRAFTING (CABG) X 4 USING LEFT INTERNAL MAMMARY ARTERY AND LEFT GREATER SAPHENOUS VEIN HARVESTED ENDOSCOPICALLY;  Surgeon: Lucas Dorise POUR, MD;  Location: [facility] OR;  Service: Open Heart Surgery;  Laterality: N/A;   CORONARY STENT PLACEMENT  1999   RCA tandem NIR stents   LEFT HEART CATH AND CORONARY ANGIOGRAPHY N/A 01/23/2018   Procedure: LEFT HEART CATH AND CORONARY ANGIOGRAPHY;  Surgeon: Mady Bruckner, MD;  Location: [facility] INVASIVE CV LAB;  Service:  Cardiovascular;  Laterality: N/A;   PERIPHERAL VASCULAR ATHERECTOMY Bilateral 12/21/2021   Procedure: PERIPHERAL VASCULAR ATHERECTOMY;  Surgeon: Court Dorn PARAS, MD;  Location: [facility] INVASIVE CV LAB;  Service: Cardiovascular;  Laterality: Bilateral;  common illiac   PERIPHERAL VASCULAR INTERVENTION Bilateral 12/21/2021   Procedure: PERIPHERAL VASCULAR INTERVENTION;  Surgeon: Court Dorn PARAS, MD;  Location: [facility] INVASIVE CV LAB;  Service: Cardiovascular;  Laterality: Bilateral;  Common Illiac[place]   RIGHT COLECTOMY  05/29/2006   Laparoscopic assisted   RIGHT HEART CATH N/A 06/14/2021   Procedure: RIGHT HEART CATH;  Surgeon: Cherrie Toribio SAUNDERS, MD;  Location: [facility] INVASIVE CV LAB;  Service: Cardiovascular;  Laterality: N/A;   TEE WITHOUT CARDIOVERSION N/A 01/28/2018   Procedure: TRANSESOPHAGEAL ECHOCARDIOGRAM (TEE);  Surgeon: Lucas Dorise POUR, MD;  Location: [facility] OR;  Service: Open Heart Surgery;  Laterality: N/A;   TONSILLECTOMY      reports that he quit smoking about 30 years ago. His smoking use included cigarettes. He started smoking about 60 years ago. He has never used smokeless tobacco. He reports current alcohol  use of about 1.0 standard drink of alcohol  per week. He reports that he does not use drugs. No Known Allergies Family History  Problem Relation Age of Onset   Heart disease Father 91   Heart disease Paternal Grandmother    Heart disease Mother    Hypertension Mother    Healthy Sister    Heart disease Paternal Grandfather    Hypertension Paternal Grandfather    Colon cancer Neg Hx    Prior to Admission medications   Medication Sig Start Date End Date Taking? Authorizing Provider  Alpha-Lipoic Acid 600 MG TABS Take 1,200 mg by mouth daily.   Yes [provider]  apixaban  (ELIQUIS ) 5 MG TABS tablet Take 5 mg by mouth 2 (two) times daily.   Yes [provider]  atorvastatin  (LIPITOR ) 80 MG tablet TAKE ONE TABLET AT SIX IN THE EVENING. 08/26/23  Yes Court Dorn PARAS, MD   cholecalciferol  (VITAMIN D ) 1000 UNITS tablet Take 1,000 Units by mouth daily.   Yes [provider]  cilostazol  (PLETAL ) 50 MG tablet Take 1 tablet (50 mg total) by mouth 2 (two) times daily. 11/19/23  Yes Court Dorn PARAS, MD  clopidogrel  (PLAVIX ) 75 MG tablet TAKE ONE TAB DAILY WITH BREAKFAST 09/24/23  Yes Court Dorn PARAS, MD  Coenzyme Q10 200 MG capsule Take 200 mg by mouth daily.    Yes [provider]  Cyanocobalamin  (VITAMIN B 12 PO) Take 200 mcg by mouth daily.   Yes [provider]  dapagliflozin  propanediol (FARXIGA ) 10 MG TABS tablet Take 1 tablet (10 mg total) by mouth daily. 09/12/23  Yes Court Dorn PARAS, MD  Flaxseed, Linseed, 1000 MG CAPS Take 1,000 mg by mouth daily.   Yes [provider]  furosemide  (LASIX ) 40 MG tablet Take 1 tablet (40 mg total) by mouth 2 (two) times daily. 09/13/23  Yes Court Dorn PARAS, MD  Magnesium  Citrate 200 MG TABS Take 400 mg by mouth daily in the afternoon.   Yes [provider]  metolazone  (ZAROXOLYN ) 2.5 MG tablet Take 1 tablet by mouth on Monday, Wednesday, and Friday 04/22/23  Yes Court Dorn PARAS, MD  Polyethyl Glycol-Propyl Glycol (SYSTANE) 0.4-0.3 % GEL ophthalmic gel Place 1 application. into both eyes every 8 (eight) hours as needed (dry eyes).   Yes [provider]  potassium chloride  SA (KLOR-CON  M) 20 MEQ tablet Take 1 tablet (20 mEq total) by mouth 2 (two) times daily. 05/17/23  Yes Cottie Donnice PARAS, MD  pyridoxine (B-6) 200 MG tablet Take 200 mg by mouth daily.   Yes [provider]  spironolactone  (ALDACTONE ) 25 MG tablet Take 1 tablet (25 mg total) by mouth daily. 05/30/23 12/09/23 Yes Zenaida Morene PARAS, MD  vitamin C (ASCORBIC ACID) 500 MG tablet Take 500 mg by mouth 3 (three) times daily.    Yes [provider]  Zinc 50 MG TABS Take 1 tablet by mouth daily.   Yes [provider]  lisinopril  (PRINIVIL ,ZESTRIL ) 20 MG tablet Take 1 tablet (20 mg total) by mouth  daily. HOLD UNTIL YOUR APPT WITH DR KATHERYN Patient not taking: No sig reported 02/11/18 01/25/20  Maxie Herlene POUR, PA-C                                                                                 Vitals:   12/09/23 1550 12/09/23 1611 12/09/23 1813 12/09/23 1814  BP:    (!) 149/131  Pulse: (!) 40   (!) 50  Resp: (!) 22   18  Temp:  98.1 F (36.7 C)  98.4 F (36.9 C)  TempSrc:  Oral  Oral  SpO2: 98%  99% 96%  Weight:      Height:       Physical Exam Vitals reviewed.  Constitutional:      General: He is not in acute distress.    Appearance: He is not ill-appearing.  HENT:     Head: Normocephalic.  Eyes:     Extraocular Movements: Extraocular movements intact.  Cardiovascular:     Rate and Rhythm: Bradycardia present. Rhythm irregular.     Pulses:          Dorsalis pedis pulses are 1+ on the right side and 1+ on the left side.       Posterior tibial pulses are 1+ on the right side and 1+ on the left side.     Heart sounds: Murmur heard.  Pulmonary:     Effort: Pulmonary effort is normal.  Abdominal:     General: There is no distension.     Palpations: Abdomen is soft.     Tenderness: There is no abdominal tenderness.  Musculoskeletal:     Right lower leg: 2+ Edema present.     Left lower leg: 2+ Edema present.  Neurological:  General: No focal deficit present.     Mental Status: He is alert and oriented to person, place, and time.     Cranial Nerves: No cranial nerve deficit or facial asymmetry.  Psychiatric:        Mood and Affect: Mood normal.        Speech: Speech normal.        Behavior: Behavior normal. Behavior is cooperative.     Labs on Admission: I have personally reviewed following labs and imaging studies CBC: Recent Labs  Lab 12/09/23 0653 12/09/23 0701  WBC 7.6  --   HGB 14.3 13.6  HCT 42.7 40.0  MCV 96.6  --   PLT 175  --    Basic Metabolic Panel: Recent Labs  Lab 12/09/23 0653 12/09/23 0701 12/09/23 1557  NA 136 137  --   K 3.9 3.9   --   CL 100 100  --   CO2 24  --   --   GLUCOSE 116* 115*  --   BUN 31* 32*  --   CREATININE 1.29* 1.30*  --   CALCIUM  9.5  --   --   MG  --   --  2.3   GFR: Estimated Creatinine Clearance: 54.2 mL/min (A) (by C-G formula based on SCr of 1.3 mg/dL (H)). Liver Function Tests: Recent Labs  Lab 12/09/23 0653  AST 36  ALT 35  ALKPHOS 71  BILITOT 1.1  PROT 6.6  ALBUMIN  4.1   No results for input(s): LIPASE, AMYLASE in the last 168 hours. No results for input(s): AMMONIA in the last 168 hours. Recent Labs    05/17/23 1254 06/19/23 0923 07/03/23 1451 12/09/23 0653 12/09/23 0701  BUN 28* 27 28* 31* 32*  CREATININE 0.76 1.12 0.83 1.29* 1.30*    Cardiac Enzymes: No results for input(s): CKTOTAL, CKMB, CKMBINDEX, TROPONINI in the last 168 hours. BNP (last 3 results) No results for input(s): PROBNP in the last 8760 hours. HbA1C: No results for input(s): HGBA1C in the last 72 hours. CBG: No results for input(s): GLUCAP in the last 168 hours. Lipid Profile: No results for input(s): CHOL, HDL, LDLCALC, TRIG, CHOLHDL, LDLDIRECT in the last 72 hours. Thyroid  Function Tests: Recent Labs    12/09/23 1557  TSH 1.488   Anemia Panel: No results for input(s): VITAMINB12, FOLATE, FERRITIN, TIBC, IRON, RETICCTPCT in the last 72 hours. Urine analysis:    Component Value Date/Time   COLORURINE YELLOW 12/09/2023 0958   APPEARANCEUR CLEAR 12/09/2023 0958   LABSPEC >1.046 (H) 12/09/2023 0958   LABSPEC 1.005 08/21/2006 1145   PHURINE 6.0 12/09/2023 0958   GLUCOSEU 150 (A) 12/09/2023 0958   HGBUR NEGATIVE 12/09/2023 0958   BILIRUBINUR NEGATIVE 12/09/2023 0958   BILIRUBINUR Negative 08/21/2006 1145   KETONESUR NEGATIVE 12/09/2023 0958   PROTEINUR NEGATIVE 12/09/2023 0958   UROBILINOGEN 0.2 05/07/2010 1911   NITRITE NEGATIVE 12/09/2023 0958   LEUKOCYTESUR NEGATIVE 12/09/2023 0958   LEUKOCYTESUR Trace 08/21/2006 1145   Radiological  Exams on Admission: DG Humerus Right Result Date: 12/09/2023 EXAM: 1 VIEW XRAY OF THE RIGHT HUMERUS 12/09/2023 08:14:00 AM COMPARISON: None available. CLINICAL HISTORY: Fall. Pt arrives via EMS for mechanical fall from home. FINDINGS: BONES AND JOINTS: No acute fracture. No focal osseous lesion. No joint dislocation. SOFT TISSUES: The soft tissues are unremarkable. IMPRESSION: 1. No evidence of acute traumatic injury. 2. Normal humerus radiographs. Electronically signed by: Waddell Calk MD 12/09/2023 08:25 AM EDT RP Workstation: [id number]   CT CHEST ABDOMEN PELVIS W  CONTRAST Result Date: 12/09/2023 EXAM: CT CHEST, ABDOMEN AND PELVIS WITH CONTRAST 12/09/2023 07:15:30 AM TECHNIQUE: CT of the chest, abdomen and pelvis was performed with the administration of intravenous contrast. Multiplanar reformatted images are provided for review. Automated exposure control, iterative reconstruction, and/or weight based adjustment of the mA/kV was utilized to reduce the radiation dose to as low as reasonably achievable. COMPARISON: CT angio chest 04/05/2021. CLINICAL HISTORY: Polytrauma, blunt. FINDINGS: CHEST: MEDIASTINUM AND LYMPH NODES: Status post median sternotomy and CABG procedure. Heart size normal. No pericardial effusion. LUNGS AND PLEURA: Mild changes of emphysema. No pleural fluid, interstitial edema or pneumothorax. Diffuse bronchial wall thickening identified. Calcified granuloma noted in the left lung base. There is persistent focal area of ground-glass attenuation in the central left upper lobe without solid component measuring 3.6 cm, image 87/9. On the previous exam this measured the same. ABDOMEN AND PELVIS: LIVER: Contour of the liver is slightly irregular which may reflect changes of early cirrhosis. GALLBLADDER AND BILE DUCTS: 8 mm gallstone. No gallbladder wall thickening or inflammation. SPLEEN: No acute abnormality. PANCREAS: No acute abnormality. ADRENAL GLANDS: No acute abnormality. KIDNEYS,  URETERS AND BLADDER: No stones in the kidneys or ureters. No hydronephrosis. No perinephric or periureteral stranding. Urinary bladder is unremarkable. GI AND BOWEL: Mild stool burden noted within the colon and rectum. Status post right colectomy. There is no bowel obstruction. REPRODUCTIVE ORGANS: Prostate gland appears normal. PERITONEUM AND RETROPERITONEUM: No ascites. No free air. VASCULATURE: Aortic atherosclerotic calcification. ABDOMINAL AND PELVIS LYMPH NODES: No lymphadenopathy. BONES AND SOFT TISSUES: Lumbar spondylosis. No signs of acute fracture. IMPRESSION: 1. No evidence of acute traumatic injury within the chest, abdomen, or pelvis. . 2. Persistent ground glass attenuating nodule within the central left upper lobe without solid component, measuring 3.6 cm, unchanged from the previous exam. Follow-up imaging in 12 months with repeat CT of the chest is recommended. 3. Slightly irregular liver contour, possibly reflecting early cirrhosis. 4. 8 mm gallstone. No gallbladder wall thickening or inflammation. 5. Status post right colectomy. Mild stool burden noted within the colon and rectum. 6. Aortic atherosclerotic calcification. 7. Mild emphysema. Electronically signed by: Waddell Calk MD 12/09/2023 07:36 AM EDT RP Workstation: [id number]   CT Head Wo Contrast Result Date: 12/09/2023 EXAM: CT HEAD AND CERVICAL SPINE 12/09/2023 06:56:34 AM TECHNIQUE: CT of the head and cervical spine was performed without the administration of intravenous contrast. Multiplanar reformatted images are provided for review. Automated exposure control, iterative reconstruction, and/or weight based adjustment of the mA/kV was utilized to reduce the radiation dose to as low as reasonably achievable. COMPARISON: CT head 07/03/2023 and CT cervical spine 01/28/2013. CLINICAL HISTORY: Facial trauma, blunt. No contrast. Pt arrives via ems for mechanical fall from home. Unsure what he fell on but possible furniture. Complaints of  head pain where laceration is. Pt reports almost LOC, saw stars but was conscious the whole time; Approx 4in hematoma to back of head, on eliquis . Has numbness on legs intermittent that is normal, denies neck pain. FINDINGS: CT HEAD BRAIN AND VENTRICLES: No acute intracranial hemorrhage. No mass effect or midline shift. No abnormal extra-axial fluid collection. No evidence of acute infarct. No hydrocephalus. Hypoattenuating foci in the cerebral white matter, most likely representing chronic small vessel disease. Prominence of the sulci and ventricles compatible with brain atrophy. ORBITS: No acute abnormality. SINUSES AND MASTOIDS: No acute abnormality. SOFT TISSUES AND SKULL: No acute skull fracture. Mild subcutaneous soft tissue stranding overlying the posterior scalp compatible with superficial contusion. CT CERVICAL  SPINE BONES AND ALIGNMENT: No acute fracture. Retrolisthesis of C4 on C5 measures 3 mm. DEGENERATIVE CHANGES: Multilevel disc space narrowing and endplate spurring identified within the thoracic spine. This is most severe at C4-5 and C6-7 . SOFT TISSUES: No prevertebral soft tissue swelling. IMPRESSION: 1. No acute intracranial abnormality. 2. Chronic small vessel ischemic disease. 3. Cerebral atrophy. 4. Mild subcutaneous soft tissue stranding overlying the posterior scalp compatible with superficial contusion. 5. No acute fracture or traumatic malalignment of the cervical spine. 6. Retrolisthesis of C4 on C5 (3 mm). 7. Multilevel cervical degenerative disc disease. Electronically signed by: Waddell Calk MD 12/09/2023 07:13 AM EDT RP Workstation: [id number]   CT Cervical Spine Wo Contrast Result Date: 12/09/2023 EXAM: CT HEAD AND CERVICAL SPINE 12/09/2023 06:56:34 AM TECHNIQUE: CT of the head and cervical spine was performed without the administration of intravenous contrast. Multiplanar reformatted images are provided for review. Automated exposure control, iterative reconstruction, and/or  weight based adjustment of the mA/kV was utilized to reduce the radiation dose to as low as reasonably achievable. COMPARISON: CT head 07/03/2023 and CT cervical spine 01/28/2013. CLINICAL HISTORY: Facial trauma, blunt. No contrast. Pt arrives via ems for mechanical fall from home. Unsure what he fell on but possible furniture. Complaints of head pain where laceration is. Pt reports almost LOC, saw stars but was conscious the whole time; Approx 4in hematoma to back of head, on eliquis . Has numbness on legs intermittent that is normal, denies neck pain. FINDINGS: CT HEAD BRAIN AND VENTRICLES: No acute intracranial hemorrhage. No mass effect or midline shift. No abnormal extra-axial fluid collection. No evidence of acute infarct. No hydrocephalus. Hypoattenuating foci in the cerebral white matter, most likely representing chronic small vessel disease. Prominence of the sulci and ventricles compatible with brain atrophy. ORBITS: No acute abnormality. SINUSES AND MASTOIDS: No acute abnormality. SOFT TISSUES AND SKULL: No acute skull fracture. Mild subcutaneous soft tissue stranding overlying the posterior scalp compatible with superficial contusion. CT CERVICAL SPINE BONES AND ALIGNMENT: No acute fracture. Retrolisthesis of C4 on C5 measures 3 mm. DEGENERATIVE CHANGES: Multilevel disc space narrowing and endplate spurring identified within the thoracic spine. This is most severe at C4-5 and C6-7 . SOFT TISSUES: No prevertebral soft tissue swelling. IMPRESSION: 1. No acute intracranial abnormality. 2. Chronic small vessel ischemic disease. 3. Cerebral atrophy. 4. Mild subcutaneous soft tissue stranding overlying the posterior scalp compatible with superficial contusion. 5. No acute fracture or traumatic malalignment of the cervical spine. 6. Retrolisthesis of C4 on C5 (3 mm). 7. Multilevel cervical degenerative disc disease. Electronically signed by: Waddell Calk MD 12/09/2023 07:13 AM EDT RP Workstation: [id number]    DG Chest Portable 1 View Result Date: 12/09/2023 CLINICAL DATA:  Fall injury with blunt polytrauma. EXAM: PORTABLE CHEST 1 VIEW RIGHT SHOULDER 3 VIEWS LEFT ELBOW 2 VIEWS PORTABLE PELVIS 1 VIEW COMPARISON:  Chest AP portable 08/26/2022, right shoulder series 05/07/2010, CT abdomen pelvis 05/26/2009. FINDINGS: Chest AP portable 6:38 a.m.: There is a tangle of overlying telemetry wiring. Intact midline sternotomy sutures with old CABG. Stable mild cardiomegaly. There is a left atrial appendage clip. No vascular congestion is seen. The mediastinum is stable. There is calcification in the aortic arch. The lungs are mildly emphysematous but clear. Osteopenia with no acute osseous findings. Right shoulder three views: Mild osteopenia. No evidence of fracture or dislocation. There is narrowing at the [facility] joint, moderate inferior AC joint osteophytes. The glenohumeral joint is unremarkable. No displaced fracture of the right upper ribcage. Soft tissues are unremarkable.  AP and lateral left elbow: Normal bone mineralization. No evidence of fracture, dislocation or joint effusion. Narrowing and spurring is noted on the medial trochleoulnar joint. Arthritic changes are not seen at the radiocapitellar joint. There is no other focal bone abnormality. Soft tissues are unremarkable. Portable pelvis: No evidence of pelvic fracture or diastasis. There is mild nonerosive arthrosis at the SI joints and hips. No other focal bone abnormality. There are marginal osteophytes in the lower lumbar spine. There are bilateral common iliac arterial stents with heavy iliofemoral calcific arteriosclerosis. Postsurgical changes prior ileocecectomy. IMPRESSION: 1. No evidence of acute chest disease. 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 2. No evidence of right shoulder fracture or dislocation. AC joint DJD. 3. No evidence of left elbow fracture or dislocation. 4. No evidence of pelvic fracture or diastasis. 5. Aortic and iliofemoral  atherosclerosis. 6. Bilateral common iliac arterial stents. Electronically Signed   By: Francis Quam M.D.   On: 12/09/2023 07:08   DG Pelvis Portable Result Date: 12/09/2023 CLINICAL DATA:  Fall injury with blunt polytrauma. EXAM: PORTABLE CHEST 1 VIEW RIGHT SHOULDER 3 VIEWS LEFT ELBOW 2 VIEWS PORTABLE PELVIS 1 VIEW COMPARISON:  Chest AP portable 08/26/2022, right shoulder series 05/07/2010, CT abdomen pelvis 05/26/2009. FINDINGS: Chest AP portable 6:38 a.m.: There is a tangle of overlying telemetry wiring. Intact midline sternotomy sutures with old CABG. Stable mild cardiomegaly. There is a left atrial appendage clip. No vascular congestion is seen. The mediastinum is stable. There is calcification in the aortic arch. The lungs are mildly emphysematous but clear. Osteopenia with no acute osseous findings. Right shoulder three views: Mild osteopenia. No evidence of fracture or dislocation. There is narrowing at the [facility] joint, moderate inferior AC joint osteophytes. The glenohumeral joint is unremarkable. No displaced fracture of the right upper ribcage. Soft tissues are unremarkable. AP and lateral left elbow: Normal bone mineralization. No evidence of fracture, dislocation or joint effusion. Narrowing and spurring is noted on the medial trochleoulnar joint. Arthritic changes are not seen at the radiocapitellar joint. There is no other focal bone abnormality. Soft tissues are unremarkable. Portable pelvis: No evidence of pelvic fracture or diastasis. There is mild nonerosive arthrosis at the SI joints and hips. No other focal bone abnormality. There are marginal osteophytes in the lower lumbar spine. There are bilateral common iliac arterial stents with heavy iliofemoral calcific arteriosclerosis. Postsurgical changes prior ileocecectomy. IMPRESSION: 1. No evidence of acute chest disease. 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 2. No evidence of right shoulder fracture or dislocation. AC joint DJD. 3. No  evidence of left elbow fracture or dislocation. 4. No evidence of pelvic fracture or diastasis. 5. Aortic and iliofemoral atherosclerosis. 6. Bilateral common iliac arterial stents. Electronically Signed   By: Francis Quam M.D.   On: 12/09/2023 07:08   DG Shoulder Right Result Date: 12/09/2023 CLINICAL DATA:  Fall injury with blunt polytrauma. EXAM: PORTABLE CHEST 1 VIEW RIGHT SHOULDER 3 VIEWS LEFT ELBOW 2 VIEWS PORTABLE PELVIS 1 VIEW COMPARISON:  Chest AP portable 08/26/2022, right shoulder series 05/07/2010, CT abdomen pelvis 05/26/2009. FINDINGS: Chest AP portable 6:38 a.m.: There is a tangle of overlying telemetry wiring. Intact midline sternotomy sutures with old CABG. Stable mild cardiomegaly. There is a left atrial appendage clip. No vascular congestion is seen. The mediastinum is stable. There is calcification in the aortic arch. The lungs are mildly emphysematous but clear. Osteopenia with no acute osseous findings. Right shoulder three views: Mild osteopenia. No evidence of fracture or dislocation. There  is narrowing at the [facility] joint, moderate inferior AC joint osteophytes. The glenohumeral joint is unremarkable. No displaced fracture of the right upper ribcage. Soft tissues are unremarkable. AP and lateral left elbow: Normal bone mineralization. No evidence of fracture, dislocation or joint effusion. Narrowing and spurring is noted on the medial trochleoulnar joint. Arthritic changes are not seen at the radiocapitellar joint. There is no other focal bone abnormality. Soft tissues are unremarkable. Portable pelvis: No evidence of pelvic fracture or diastasis. There is mild nonerosive arthrosis at the SI joints and hips. No other focal bone abnormality. There are marginal osteophytes in the lower lumbar spine. There are bilateral common iliac arterial stents with heavy iliofemoral calcific arteriosclerosis. Postsurgical changes prior ileocecectomy. IMPRESSION: 1. No evidence of acute chest disease.  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 2. No evidence of right shoulder fracture or dislocation. AC joint DJD. 3. No evidence of left elbow fracture or dislocation. 4. No evidence of pelvic fracture or diastasis. 5. Aortic and iliofemoral atherosclerosis. 6. Bilateral common iliac arterial stents. Electronically Signed   By: Francis Quam M.D.   On: 12/09/2023 07:08   DG Elbow 2 Views Left Result Date: 12/09/2023 CLINICAL DATA:  Fall injury with blunt polytrauma. EXAM: PORTABLE CHEST 1 VIEW RIGHT SHOULDER 3 VIEWS LEFT ELBOW 2 VIEWS PORTABLE PELVIS 1 VIEW COMPARISON:  Chest AP portable 08/26/2022, right shoulder series 05/07/2010, CT abdomen pelvis 05/26/2009. FINDINGS: Chest AP portable 6:38 a.m.: There is a tangle of overlying telemetry wiring. Intact midline sternotomy sutures with old CABG. Stable mild cardiomegaly. There is a left atrial appendage clip. No vascular congestion is seen. The mediastinum is stable. There is calcification in the aortic arch. The lungs are mildly emphysematous but clear. Osteopenia with no acute osseous findings. Right shoulder three views: Mild osteopenia. No evidence of fracture or dislocation. There is narrowing at the [facility] joint, moderate inferior AC joint osteophytes. The glenohumeral joint is unremarkable. No displaced fracture of the right upper ribcage. Soft tissues are unremarkable. AP and lateral left elbow: Normal bone mineralization. No evidence of fracture, dislocation or joint effusion. Narrowing and spurring is noted on the medial trochleoulnar joint. Arthritic changes are not seen at the radiocapitellar joint. There is no other focal bone abnormality. Soft tissues are unremarkable. Portable pelvis: No evidence of pelvic fracture or diastasis. There is mild nonerosive arthrosis at the SI joints and hips. No other focal bone abnormality. There are marginal osteophytes in the lower lumbar spine. There are bilateral common iliac arterial stents with heavy  iliofemoral calcific arteriosclerosis. Postsurgical changes prior ileocecectomy. IMPRESSION: 1. No evidence of acute chest disease. 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 2. No evidence of right shoulder fracture or dislocation. AC joint DJD. 3. No evidence of left elbow fracture or dislocation. 4. No evidence of pelvic fracture or diastasis. 5. Aortic and iliofemoral atherosclerosis. 6. Bilateral common iliac arterial stents. Electronically Signed   By: Francis Quam M.D.   On: 12/09/2023 07:08   Data Reviewed: Relevant notes from primary care and specialist visits, past discharge summaries as available in EHR, including Care Everywhere . Prior diagnostic testing as pertinent to current admission diagnoses, Updated medications and problem lists for reconciliation .ED course, including vitals, labs, imaging, treatment and response to treatment,Triage notes, nursing and pharmacy notes and ED provider's notes.Notable results as noted in HPI.Discussed case with EDMD/ ED APP/ or Specialty MD on call and as needed.  Assessment & Plan   >> Syncope collapse: Suspect secondary to symptomatic  bradycardia.  Cardiology consulted. Will admit to cardiac floor continuous telemetry monitoring continuous pulse oximetry.  Differentials also include TIA and CVA and will obtain MRI of the brain.  Additional evaluation per cardiology.  As needed atropine  for heart rate below 50s.  Electrolyte check TFTs.  >> Atrial fibrillation with bradycardia: Bradycardia noted on the EKG. Patient is currently on Eliquis  which we will continue. Med rec is pending patient is on triple therapy with Eliquis  Pletal  and Plavix . Will resume once med rec is available.  Currently on eliquis . We will continue once med rec is available.  >> PAD: Status post bilateral iliac stenting October 2023, decreased pedal pulses will obtain ABIs.   >> CAD status post PCI: Continue atorvastatin  and Eliquis .  >> Essential  hypertension: Currently will continue patient on Lasix  hold Aldactone  and metolazone .    >> OSA: Continue home CPAP   >> Chronic diastolic congestive heart failure: Will continue patient on his home regimen of Lasix  with strict I's and O's and daily weights. Last Weight  Most recent update: 12/09/2023  6:37 AM    Weight  88 kg (194 lb)           1. Patient bradycardic with HR 30- low 40s during exam. 2. Left ventricular ejection fraction, by estimation, is 55 to 60% . The left ventricle has normal function. The left ventricle has no regional wall motion abnormalities. There is mild left ventricular hypertrophy. Left ventricular diastolic parameters are indeterminate. 3. Right ventricular systolic function is mildly reduced. The right ventricular size is mildly enlarged. There is moderately elevated pulmonary artery systolic pressure. The estimated right ventricular systolic pressure is 53. 9 mmHg. 4. Left atrial size was moderately dilated. 5. Right atrial size was severely dilated. 6. The mitral valve is degenerative. Trivial mitral valve regurgitation. 7. Tricuspid valve regurgitation is moderate. 8. The aortic valve is tricuspid. There is mild calcification of the aortic valve. There is mild thickening of the aortic valve. Aortic valve regurgitation is not visualized. Aortic valve sclerosis/ calcification is present, without any evidence of aortic stenosis. 9. Aortic dilatation noted. There is borderline dilatation of the aortic root, measuring 40 mm. 10. The inferior vena cava is dilated in size with < 50% respiratory variability, suggesting right atrial pressure of 15 mmHg.    DVT prophylaxis:  Eliquis   Consults:  Cardiology.   Advance Care Planning:    Code Status: Full Code   Family Communication:  None.  Disposition Plan:  Home.  Severity of Illness: The appropriate patient status for this patient is INPATIENT. Inpatient status is judged to be reasonable and necessary in  order to provide the required intensity of service to ensure the patient's safety. The patient's presenting symptoms, physical exam findings, and initial radiographic and laboratory data in the context of their chronic comorbidities is felt to place them at high risk for further clinical deterioration. Furthermore, it is not anticipated that the patient will be medically stable for discharge from the hospital within 2 midnights of admission.   * I certify that at the point of admission it is my clinical judgment that the patient will require inpatient hospital care spanning beyond 2 midnights from the point of admission due to high intensity of service, high risk for further deterioration and high frequency of surveillance required.*  Unresulted Labs (From admission, onward)     Start     Ordered   12/10/23 0500  Comprehensive metabolic panel  Tomorrow morning,   R  12/09/23 1531   12/10/23 0500  CBC  Tomorrow morning,   R        12/09/23 1531            Meds ordered this encounter  Medications   iohexol  (OMNIPAQUE ) 350 MG/ML injection 75 mL   lactated ringers  bolus 1,000 mL   atorvastatin  (LIPITOR ) tablet 80 mg   DISCONTD: furosemide  (LASIX ) tablet 40 mg   sodium chloride  flush (NS) 0.9 % injection 3 mL   sodium chloride  flush (NS) 0.9 % injection 3 mL   0.9 %  sodium chloride  infusion   furosemide  (LASIX ) tablet 40 mg   apixaban  (ELIQUIS ) tablet 5 mg   clopidogrel  (PLAVIX ) tablet 75 mg   atropine  1 MG/10ML injection 1 mg     Orders Placed This Encounter  Procedures   DG Chest Portable 1 View   DG Pelvis Portable   DG Shoulder Right   DG Humerus Right   CT Head Wo Contrast   CT Cervical Spine Wo Contrast   DG Elbow 2 Views Left   CT CHEST ABDOMEN PELVIS W CONTRAST   Comprehensive metabolic panel   CBC   Ethanol   Urinalysis, Routine w reflex microscopic -Urine, Clean Catch   Protime-INR   Brain natriuretic peptide   Magnesium    TSH   Comprehensive metabolic  panel   CBC   Diet 2 gram sodium Room service appropriate? Yes; Fluid consistency: Thin   ED Cardiac monitoring   Measure blood pressure   Initiate Carrier Fluid Protocol   Clean incision/wound with   Orthostatic vital signs   Vital signs   Notify physician (specify)   Mobility Protocol: No Restrictions   Refer to Sidebar Report Mobility Protocol for Adult Inpatient   Initiate Adult Central Line Maintenance and Catheter Protocol for patients with central line (CVC, PICC, Port, Hemodialysis, Trialysis)   Initiate CHG Protocol   Do not place and if present remove PureWick   Initiate Oral Care Protocol   Initiate Carrier Fluid Protocol   RN may order General Admission PRN Orders utilizing General Admission PRN medications (through manage orders) for the following patient needs: allergy symptoms (Claritin), cold sores (Carmex), cough (Robitussin DM), eye irritation (Liquifilm Tears), hemorrhoids (Tucks), indigestion (Maalox), minor skin irritation (Hydrocortisone Cream), muscle pain Lucienne Gay), nose irritation (saline nasal spray) and sore throat (Chloraseptic spray).   Cardiac Monitoring - Continuous Indefinite   Ambulate with assistance   Strict intake and output   Daily weights   Full code   Inpatient consult to Cardiology   Consult to hospitalist   ED Pulse oximetry, continuous   Oxygen  therapy Mode or (Route): Nasal cannula; Liters Per Minute: 2; Keep O2 saturation between: greater than 92 %   I-Stat Chem 8, ED   I-Stat Lactic Acid, ED   ED EKG   EKG 12-Lead   ECHOCARDIOGRAM COMPLETE   Sample to Blood Bank   Admit to Inpatient (patient's expected length of stay will be greater than 2 midnights or inpatient only procedure)   Aspiration precautions   Fall precautions   VAS US  LOWER EXTREMITY ARTERIAL DUPLEX (ONLY [facility] & [facility])    Author: Mario LULLA Blanch, MD 12 pm- 8 pm. [facility]. 12/09/2023 6:44 PM Please note for any communication after hours contact [facility] Assigned provider  on call on Amion.

## 2023-12-09 NOTE — Assessment & Plan Note (Signed)
 Vitals:   12/09/23 0632 12/09/23 0700 12/09/23 0715 12/09/23 0900  BP: (!) 141/60 (!) 130/58 (!) 161/65 (!) 136/54   12/09/23 1220 12/09/23 1230 12/09/23 1315 12/09/23 1330  BP: (!) 122/47 (!) 127/36 (!) 155/62 (!) 155/107  Currently will continue patient on Lasix  hold Aldactone  and metolazone .

## 2023-12-10 ENCOUNTER — Inpatient Hospital Stay (HOSPITAL_COMMUNITY)

## 2023-12-10 DIAGNOSIS — R55 Syncope and collapse: Secondary | ICD-10-CM | POA: Diagnosis not present

## 2023-12-10 DIAGNOSIS — I5032 Chronic diastolic (congestive) heart failure: Secondary | ICD-10-CM | POA: Diagnosis not present

## 2023-12-10 DIAGNOSIS — I739 Peripheral vascular disease, unspecified: Secondary | ICD-10-CM | POA: Diagnosis not present

## 2023-12-10 DIAGNOSIS — I2510 Atherosclerotic heart disease of native coronary artery without angina pectoris: Secondary | ICD-10-CM

## 2023-12-10 DIAGNOSIS — Z9861 Coronary angioplasty status: Secondary | ICD-10-CM

## 2023-12-10 DIAGNOSIS — R001 Bradycardia, unspecified: Secondary | ICD-10-CM

## 2023-12-10 DIAGNOSIS — I4821 Permanent atrial fibrillation: Secondary | ICD-10-CM | POA: Diagnosis not present

## 2023-12-10 DIAGNOSIS — I4891 Unspecified atrial fibrillation: Secondary | ICD-10-CM | POA: Diagnosis not present

## 2023-12-10 LAB — COMPREHENSIVE METABOLIC PANEL WITH GFR
ALT: 29 U/L (ref 0–44)
AST: 27 U/L (ref 15–41)
Albumin: 3.7 g/dL (ref 3.5–5.0)
Alkaline Phosphatase: 41 U/L (ref 38–126)
Anion gap: 11 (ref 5–15)
BUN: 23 mg/dL (ref 8–23)
CO2: 27 mmol/L (ref 22–32)
Calcium: 8.9 mg/dL (ref 8.9–10.3)
Chloride: 98 mmol/L (ref 98–111)
Creatinine, Ser: 1 mg/dL (ref 0.61–1.24)
GFR, Estimated: >60 mL/min (ref >60)
Glucose, Bld: 100 mg/dL — ABNORMAL HIGH (ref 70–99)
Potassium: 3.8 mmol/L (ref 3.5–5.1)
Sodium: 136 mmol/L (ref 135–145)
Total Bilirubin: 1.9 mg/dL — ABNORMAL HIGH (ref 0.0–1.2)
Total Protein: 5.9 g/dL — ABNORMAL LOW (ref 6.5–8.1)

## 2023-12-10 LAB — CBC
HCT: 39.4 % (ref 39.0–52.0)
Hemoglobin: 13.1 g/dL (ref 13.0–17.0)
MCH: 32.3 pg (ref 26.0–34.0)
MCHC: 33.2 g/dL (ref 30.0–36.0)
MCV: 97 fL (ref 80.0–100.0)
Platelets: 163 K/uL (ref 150–400)
RBC: 4.06 MIL/uL — ABNORMAL LOW (ref 4.22–5.81)
RDW: 13.9 % (ref 11.5–15.5)
WBC: 9.4 K/uL (ref 4.0–10.5)
nRBC: 0 % (ref 0.0–0.2)

## 2023-12-10 LAB — ECHOCARDIOGRAM COMPLETE
Area-P 1/2: 2.37 cm2
Height: 75 in
S' Lateral: 2.6 cm
Weight: 3171.1 [oz_av]

## 2023-12-10 LAB — VAS US ABI WITH/WO TBI

## 2023-12-10 NOTE — Progress Notes (Signed)
   12/10/23 9177  Orthostatic Lying   BP- Lying 104/57  Pulse- Lying (!) 44  Orthostatic Sitting  BP- Sitting (!) 110/39  Pulse- Sitting (!) 46  Orthostatic Standing at 0 minutes  BP- Standing at 0 minutes 118/47  Pulse- Standing at 0 minutes 60  Orthostatic Standing at 3 minutes  BP- Standing at 3 minutes (!) 117/37  Pulse- Standing at 3 minutes 53

## 2023-12-10 NOTE — Plan of Care (Signed)
   Problem: Activity: Goal: Risk for activity intolerance will decrease Outcome: Progressing   Problem: Nutrition: Goal: Adequate nutrition will be maintained Outcome: Progressing   Problem: Coping: Goal: Level of anxiety will decrease Outcome: Progressing   Problem: Elimination: Goal: Will not experience complications related to bowel motility Outcome: Progressing Goal: Will not experience complications related to urinary retention Outcome: Progressing

## 2023-12-10 NOTE — Progress Notes (Addendum)
 PROGRESS NOTE    [name]  FMW:[id number] DOB: [birth year] DOA: 12/09/2023 PCP: Dwight Trula SQUIBB, MD   Brief Narrative:    Assessment & Plan:   Principal Problem:   Syncope and collapse Active Problems:   Atrial fibrillation with slow ventricular response (HCC)   Chronic anticoagulation   CAD S/P percutaneous coronary angioplasty   Essential hypertension   OSA treated with BiPAP   Chronic diastolic heart failure (HCC)  Summary: [name] is a 83 y.o. 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  Patient denies dizziness states he has occasional alcohol , no smoking.  No chest pain palpitations nausea or vomiting.  Patient found to have bradycardia in 40s with chronic A-fib.  Troponin mildly elevated, peaked at 85.  Creatinine 1.25 with baseline 0.7-1.0.  Fall likely mechanical or orthostatic hypotension, patient admitted for further observation and cardiology/EP evaluation given bradycardia with fall    Assessment and plan:  >> Fall/syncope: Fall likely mechanical.  Patient states he fell simply because he was hurrying up putting his clothes.  Denies any loss of consciousness or dizziness. Bradycardia noted.  Cardiology/EP on board.  Bradycardia not felt to be symptomatic and no indication for pacemaker.  Will continue to monitor in telemetry. Monitor orthostatics Hold diuretics today. Follow-up on echocardiogram Will do PT evaluation   >> Atrial fibrillation with bradycardia: Apparently on triple anticoagulation therapy with Eliquis , Pletal  and Plavix .  Likely can discontinue Plavix .   >> PAD: Status post bilateral iliac stenting October 2023, decreased pedal pulses  Will follow-up on ABIs ordered on admission   >>  CAD status post PCI: Continue atorvastatin  and Eliquis .   >> Essential hypertension: Will hold Lasix , Aldactone  and metolazone  due to concern for fall/orthostasis.     >> OSA: Continue home CPAP     >> Chronic diastolic congestive heart failure: Holding diuretics today, monitor intake and output, daily weights. Continue statins.  Antiplatelets.  >> Cirrhotic appearance of liver, need to follow-up outpatient for any signs or symptoms of cirrhosis/liver failure  >> Left upper lobe ground glass opacity, similar to before, radiology has recommended 55-month follow-up CT.  DVT prophylaxis: Eliquis  chronically Code Status: Full code Family Communication: No family at the bedside Disposition Plan: home  Consultants:   Procedures:   Antimicrobials:    Subjective:  Patient seen and examined at the bedside.  He was sitting on chair not in any acute distress.  Vital signs are stable.  He continues to be bradycardic.  Denies dizziness .  He says he simply fell because he was hurrying up putting his clothes. Objective: Vitals:   12/09/23 2336 12/10/23 0436 12/10/23 0640 12/10/23 1041  BP: (!) 129/44 104/78 (!) 115/47 (!) 114/47  Pulse: (!) 44 (!) 48 (!) 50 (!) 43  Resp: 16 20 20 17   Temp:  (!) 97.1 F (36.2 C) 98.1 F (36.7 C) 98.1 F (36.7 C)  TempSrc:  Oral Oral Oral  SpO2: 97% 98% 98% 98%  Weight:      Height:        Intake/Output Summary (Last 24 hours) at 12/10/2023 1110 Last data filed at 12/10/2023 0745 Gross per 24 hour  Intake 478 ml  Output --  Net 478 ml  Filed Weights   12/09/23 0636 12/09/23 1946  Weight: 88 kg 89.9 kg    Examination:  Physical Exam Vitals reviewed.  Constitutional:      General: He is not in acute distress.    Appearance: He is not ill-appearing.  HENT:     Head: Normocephalic.  Eyes:     Extraocular Movements: Extraocular movements intact.  Cardiovascular:     Rate and Rhythm: Bradycardia present. Rhythm irregular.      Pulses:          Dorsalis pedis pulses are 1+ on the right side and 1+ on the left side.       Posterior tibial pulses are 1+ on the right side and 1+ on the left side.     Heart sounds: Murmur heard.  Pulmonary:     Effort: Pulmonary effort is normal.  Abdominal:     General: There is no distension.     Palpations: Abdomen is soft.     Tenderness: There is no abdominal tenderness.  Musculoskeletal:     Right lower leg: 2+ Edema present.     Left lower leg: 2+ Edema present.  Neurological:     General: No focal deficit present.     Mental Status: He is alert and oriented to person, place, and time.     Cranial Nerves: No cranial nerve deficit or facial asymmetry.  Psychiatric:        Mood and Affect: Mood normal.        Speech: Speech normal.        Behavior: Behavior normal. Behavior is cooperative.    Data Reviewed: I have personally reviewed following labs and imaging studies  CBC: Recent Labs  Lab 12/09/23 0653 12/09/23 0701 12/10/23 0251  WBC 7.6  --  9.4  HGB 14.3 13.6 13.1  HCT 42.7 40.0 39.4  MCV 96.6  --  97.0  PLT 175  --  163   Basic Metabolic Panel: Recent Labs  Lab 12/09/23 0653 12/09/23 0701 12/09/23 1557 12/10/23 0251  NA 136 137  --  136  K 3.9 3.9  --  3.8  CL 100 100  --  98  CO2 24  --   --  27  GLUCOSE 116* 115*  --  100*  BUN 31* 32*  --  23  CREATININE 1.29* 1.30*  --  1.00  CALCIUM  9.5  --   --  8.9  MG  --   --  2.3  --    GFR: Estimated Creatinine Clearance: 70.4 mL/min (by C-G formula based on SCr of 1 mg/dL). Liver Function Tests: Recent Labs  Lab 12/09/23 0653 12/10/23 0251  AST 36 27  ALT 35 29  ALKPHOS 71 41  BILITOT 1.1 1.9*  PROT 6.6 5.9*  ALBUMIN  4.1 3.7   No results for input(s): LIPASE, AMYLASE in the last 168 hours. No results for input(s): AMMONIA in the last 168 hours. Coagulation Profile: Recent Labs  Lab 12/09/23 0653  INR 1.0   Cardiac Enzymes: No results for input(s): CKTOTAL, CKMB,  CKMBINDEX, TROPONINI in the last 168 hours. BNP (last 3 results) No results for input(s): PROBNP in the last 8760 hours. HbA1C: No results for input(s): HGBA1C in the last 72 hours. CBG: No results for input(s): GLUCAP in the last 168 hours. Lipid Profile: No results for input(s): CHOL, HDL, LDLCALC, TRIG, CHOLHDL, LDLDIRECT in the last 72 hours. Thyroid  Function Tests: Recent Labs    12/09/23 1557  TSH 1.488  Anemia Panel: No results for input(s): VITAMINB12, FOLATE, FERRITIN, TIBC, IRON, RETICCTPCT in the last 72 hours. Sepsis Labs: Recent Labs  Lab 12/09/23 0701  LATICACIDVEN 1.6    Recent Results (from the past 240 hours)  Surgical pcr screen     Status: None   Collection Time: 12/09/23  7:41 PM   Specimen: Nasal Mucosa; Nasal Swab  Result Value Ref Range Status   MRSA, PCR NEGATIVE NEGATIVE Final   Staphylococcus aureus NEGATIVE NEGATIVE Final    Comment: (NOTE) The Xpert SA Assay (FDA approved for NASAL specimens in patients 43 years of age and older), is one component of a comprehensive surveillance program. It is not intended to diagnose infection nor to guide or monitor treatment. Performed at [facility], [address] [place], [place] [address]          Radiology Studies: DG Humerus Right Result Date: 12/09/2023 EXAM: 1 VIEW XRAY OF THE RIGHT HUMERUS 12/09/2023 08:14:00 AM COMPARISON: None available. CLINICAL HISTORY: Fall. Pt arrives via EMS for mechanical fall from home. FINDINGS: BONES AND JOINTS: No acute fracture. No focal osseous lesion. No joint dislocation. SOFT TISSUES: The soft tissues are unremarkable. IMPRESSION: 1. No evidence of acute traumatic injury. 2. Normal humerus radiographs. Electronically signed by: Waddell Calk MD 12/09/2023 08:25 AM EDT RP Workstation: [id number]   CT CHEST ABDOMEN PELVIS W CONTRAST Result Date: 12/09/2023 EXAM: CT CHEST, ABDOMEN AND PELVIS WITH CONTRAST 12/09/2023  07:15:30 AM TECHNIQUE: CT of the chest, abdomen and pelvis was performed with the administration of intravenous contrast. Multiplanar reformatted images are provided for review. Automated exposure control, iterative reconstruction, and/or weight based adjustment of the mA/kV was utilized to reduce the radiation dose to as low as reasonably achievable. COMPARISON: CT angio chest 04/05/2021. CLINICAL HISTORY: Polytrauma, blunt. FINDINGS: CHEST: MEDIASTINUM AND LYMPH NODES: Status post median sternotomy and CABG procedure. Heart size normal. No pericardial effusion. LUNGS AND PLEURA: Mild changes of emphysema. No pleural fluid, interstitial edema or pneumothorax. Diffuse bronchial wall thickening identified. Calcified granuloma noted in the left lung base. There is persistent focal area of ground-glass attenuation in the central left upper lobe without solid component measuring 3.6 cm, image 87/9. On the previous exam this measured the same. ABDOMEN AND PELVIS: LIVER: Contour of the liver is slightly irregular which may reflect changes of early cirrhosis. GALLBLADDER AND BILE DUCTS: 8 mm gallstone. No gallbladder wall thickening or inflammation. SPLEEN: No acute abnormality. PANCREAS: No acute abnormality. ADRENAL GLANDS: No acute abnormality. KIDNEYS, URETERS AND BLADDER: No stones in the kidneys or ureters. No hydronephrosis. No perinephric or periureteral stranding. Urinary bladder is unremarkable. GI AND BOWEL: Mild stool burden noted within the colon and rectum. Status post right colectomy. There is no bowel obstruction. REPRODUCTIVE ORGANS: Prostate gland appears normal. PERITONEUM AND RETROPERITONEUM: No ascites. No free air. VASCULATURE: Aortic atherosclerotic calcification. ABDOMINAL AND PELVIS LYMPH NODES: No lymphadenopathy. BONES AND SOFT TISSUES: Lumbar spondylosis. No signs of acute fracture. IMPRESSION: 1. No evidence of acute traumatic injury within the chest, abdomen, or pelvis. . 2. Persistent ground  glass attenuating nodule within the central left upper lobe without solid component, measuring 3.6 cm, unchanged from the previous exam. Follow-up imaging in 12 months with repeat CT of the chest is recommended. 3. Slightly irregular liver contour, possibly reflecting early cirrhosis. 4. 8 mm gallstone. No gallbladder wall thickening or inflammation. 5. Status post right colectomy. Mild stool burden noted within the colon and rectum. 6. Aortic atherosclerotic calcification. 7. Mild emphysema. Electronically  signed by: Waddell Calk MD 12/09/2023 07:36 AM EDT RP Workstation: [id number]   CT Head Wo Contrast Result Date: 12/09/2023 EXAM: CT HEAD AND CERVICAL SPINE 12/09/2023 06:56:34 AM TECHNIQUE: CT of the head and cervical spine was performed without the administration of intravenous contrast. Multiplanar reformatted images are provided for review. Automated exposure control, iterative reconstruction, and/or weight based adjustment of the mA/kV was utilized to reduce the radiation dose to as low as reasonably achievable. COMPARISON: CT head 07/03/2023 and CT cervical spine 01/28/2013. CLINICAL HISTORY: Facial trauma, blunt. No contrast. Pt arrives via ems for mechanical fall from home. Unsure what he fell on but possible furniture. Complaints of head pain where laceration is. Pt reports almost LOC, saw stars but was conscious the whole time; Approx 4in hematoma to back of head, on eliquis . Has numbness on legs intermittent that is normal, denies neck pain. FINDINGS: CT HEAD BRAIN AND VENTRICLES: No acute intracranial hemorrhage. No mass effect or midline shift. No abnormal extra-axial fluid collection. No evidence of acute infarct. No hydrocephalus. Hypoattenuating foci in the cerebral white matter, most likely representing chronic small vessel disease. Prominence of the sulci and ventricles compatible with brain atrophy. ORBITS: No acute abnormality. SINUSES AND MASTOIDS: No acute abnormality. SOFT TISSUES AND  SKULL: No acute skull fracture. Mild subcutaneous soft tissue stranding overlying the posterior scalp compatible with superficial contusion. CT CERVICAL SPINE BONES AND ALIGNMENT: No acute fracture. Retrolisthesis of C4 on C5 measures 3 mm. DEGENERATIVE CHANGES: Multilevel disc space narrowing and endplate spurring identified within the thoracic spine. This is most severe at C4-5 and C6-7 . SOFT TISSUES: No prevertebral soft tissue swelling. IMPRESSION: 1. No acute intracranial abnormality. 2. Chronic small vessel ischemic disease. 3. Cerebral atrophy. 4. Mild subcutaneous soft tissue stranding overlying the posterior scalp compatible with superficial contusion. 5. No acute fracture or traumatic malalignment of the cervical spine. 6. Retrolisthesis of C4 on C5 (3 mm). 7. Multilevel cervical degenerative disc disease. Electronically signed by: Waddell Calk MD 12/09/2023 07:13 AM EDT RP Workstation: [id number]   CT Cervical Spine Wo Contrast Result Date: 12/09/2023 EXAM: CT HEAD AND CERVICAL SPINE 12/09/2023 06:56:34 AM TECHNIQUE: CT of the head and cervical spine was performed without the administration of intravenous contrast. Multiplanar reformatted images are provided for review. Automated exposure control, iterative reconstruction, and/or weight based adjustment of the mA/kV was utilized to reduce the radiation dose to as low as reasonably achievable. COMPARISON: CT head 07/03/2023 and CT cervical spine 01/28/2013. CLINICAL HISTORY: Facial trauma, blunt. No contrast. Pt arrives via ems for mechanical fall from home. Unsure what he fell on but possible furniture. Complaints of head pain where laceration is. Pt reports almost LOC, saw stars but was conscious the whole time; Approx 4in hematoma to back of head, on eliquis . Has numbness on legs intermittent that is normal, denies neck pain. FINDINGS: CT HEAD BRAIN AND VENTRICLES: No acute intracranial hemorrhage. No mass effect or midline shift. No abnormal  extra-axial fluid collection. No evidence of acute infarct. No hydrocephalus. Hypoattenuating foci in the cerebral white matter, most likely representing chronic small vessel disease. Prominence of the sulci and ventricles compatible with brain atrophy. ORBITS: No acute abnormality. SINUSES AND MASTOIDS: No acute abnormality. SOFT TISSUES AND SKULL: No acute skull fracture. Mild subcutaneous soft tissue stranding overlying the posterior scalp compatible with superficial contusion. CT CERVICAL SPINE BONES AND ALIGNMENT: No acute fracture. Retrolisthesis of C4 on C5 measures 3 mm. DEGENERATIVE CHANGES: Multilevel disc space narrowing and endplate spurring identified within the  thoracic spine. This is most severe at C4-5 and C6-7 . SOFT TISSUES: No prevertebral soft tissue swelling. IMPRESSION: 1. No acute intracranial abnormality. 2. Chronic small vessel ischemic disease. 3. Cerebral atrophy. 4. Mild subcutaneous soft tissue stranding overlying the posterior scalp compatible with superficial contusion. 5. No acute fracture or traumatic malalignment of the cervical spine. 6. Retrolisthesis of C4 on C5 (3 mm). 7. Multilevel cervical degenerative disc disease. Electronically signed by: Waddell Calk MD 12/09/2023 07:13 AM EDT RP Workstation: [id number]   DG Chest Portable 1 View Result Date: 12/09/2023 CLINICAL DATA:  Fall injury with blunt polytrauma. EXAM: PORTABLE CHEST 1 VIEW RIGHT SHOULDER 3 VIEWS LEFT ELBOW 2 VIEWS PORTABLE PELVIS 1 VIEW COMPARISON:  Chest AP portable 08/26/2022, right shoulder series 05/07/2010, CT abdomen pelvis 05/26/2009. FINDINGS: Chest AP portable 6:38 a.m.: There is a tangle of overlying telemetry wiring. Intact midline sternotomy sutures with old CABG. Stable mild cardiomegaly. There is a left atrial appendage clip. No vascular congestion is seen. The mediastinum is stable. There is calcification in the aortic arch. The lungs are mildly emphysematous but clear. Osteopenia with no acute  osseous findings. Right shoulder three views: Mild osteopenia. No evidence of fracture or dislocation. There is narrowing at the [facility] joint, moderate inferior AC joint osteophytes. The glenohumeral joint is unremarkable. No displaced fracture of the right upper ribcage. Soft tissues are unremarkable. AP and lateral left elbow: Normal bone mineralization. No evidence of fracture, dislocation or joint effusion. Narrowing and spurring is noted on the medial trochleoulnar joint. Arthritic changes are not seen at the radiocapitellar joint. There is no other focal bone abnormality. Soft tissues are unremarkable. Portable pelvis: No evidence of pelvic fracture or diastasis. There is mild nonerosive arthrosis at the SI joints and hips. No other focal bone abnormality. There are marginal osteophytes in the lower lumbar spine. There are bilateral common iliac arterial stents with heavy iliofemoral calcific arteriosclerosis. Postsurgical changes prior ileocecectomy. IMPRESSION: 1. No evidence of acute chest disease. 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 2. No evidence of right shoulder fracture or dislocation. AC joint DJD. 3. No evidence of left elbow fracture or dislocation. 4. No evidence of pelvic fracture or diastasis. 5. Aortic and iliofemoral atherosclerosis. 6. Bilateral common iliac arterial stents. Electronically Signed   By: Francis Quam M.D.   On: 12/09/2023 07:08   DG Pelvis Portable Result Date: 12/09/2023 CLINICAL DATA:  Fall injury with blunt polytrauma. EXAM: PORTABLE CHEST 1 VIEW RIGHT SHOULDER 3 VIEWS LEFT ELBOW 2 VIEWS PORTABLE PELVIS 1 VIEW COMPARISON:  Chest AP portable 08/26/2022, right shoulder series 05/07/2010, CT abdomen pelvis 05/26/2009. FINDINGS: Chest AP portable 6:38 a.m.: There is a tangle of overlying telemetry wiring. Intact midline sternotomy sutures with old CABG. Stable mild cardiomegaly. There is a left atrial appendage clip. No vascular congestion is seen. The mediastinum is  stable. There is calcification in the aortic arch. The lungs are mildly emphysematous but clear. Osteopenia with no acute osseous findings. Right shoulder three views: Mild osteopenia. No evidence of fracture or dislocation. There is narrowing at the [facility] joint, moderate inferior AC joint osteophytes. The glenohumeral joint is unremarkable. No displaced fracture of the right upper ribcage. Soft tissues are unremarkable. AP and lateral left elbow: Normal bone mineralization. No evidence of fracture, dislocation or joint effusion. Narrowing and spurring is noted on the medial trochleoulnar joint. Arthritic changes are not seen at the radiocapitellar joint. There is no other focal bone abnormality. Soft tissues are unremarkable. Portable pelvis: No evidence  of pelvic fracture or diastasis. There is mild nonerosive arthrosis at the SI joints and hips. No other focal bone abnormality. There are marginal osteophytes in the lower lumbar spine. There are bilateral common iliac arterial stents with heavy iliofemoral calcific arteriosclerosis. Postsurgical changes prior ileocecectomy. IMPRESSION: 1. No evidence of acute chest disease. 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 2. No evidence of right shoulder fracture or dislocation. AC joint DJD. 3. No evidence of left elbow fracture or dislocation. 4. No evidence of pelvic fracture or diastasis. 5. Aortic and iliofemoral atherosclerosis. 6. Bilateral common iliac arterial stents. Electronically Signed   By: Francis Quam M.D.   On: 12/09/2023 07:08   DG Shoulder Right Result Date: 12/09/2023 CLINICAL DATA:  Fall injury with blunt polytrauma. EXAM: PORTABLE CHEST 1 VIEW RIGHT SHOULDER 3 VIEWS LEFT ELBOW 2 VIEWS PORTABLE PELVIS 1 VIEW COMPARISON:  Chest AP portable 08/26/2022, right shoulder series 05/07/2010, CT abdomen pelvis 05/26/2009. FINDINGS: Chest AP portable 6:38 a.m.: There is a tangle of overlying telemetry wiring. Intact midline sternotomy sutures with old  CABG. Stable mild cardiomegaly. There is a left atrial appendage clip. No vascular congestion is seen. The mediastinum is stable. There is calcification in the aortic arch. The lungs are mildly emphysematous but clear. Osteopenia with no acute osseous findings. Right shoulder three views: Mild osteopenia. No evidence of fracture or dislocation. There is narrowing at the [facility] joint, moderate inferior AC joint osteophytes. The glenohumeral joint is unremarkable. No displaced fracture of the right upper ribcage. Soft tissues are unremarkable. AP and lateral left elbow: Normal bone mineralization. No evidence of fracture, dislocation or joint effusion. Narrowing and spurring is noted on the medial trochleoulnar joint. Arthritic changes are not seen at the radiocapitellar joint. There is no other focal bone abnormality. Soft tissues are unremarkable. Portable pelvis: No evidence of pelvic fracture or diastasis. There is mild nonerosive arthrosis at the SI joints and hips. No other focal bone abnormality. There are marginal osteophytes in the lower lumbar spine. There are bilateral common iliac arterial stents with heavy iliofemoral calcific arteriosclerosis. Postsurgical changes prior ileocecectomy. IMPRESSION: 1. No evidence of acute chest disease. 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 2. No evidence of right shoulder fracture or dislocation. AC joint DJD. 3. No evidence of left elbow fracture or dislocation. 4. No evidence of pelvic fracture or diastasis. 5. Aortic and iliofemoral atherosclerosis. 6. Bilateral common iliac arterial stents. Electronically Signed   By: Francis Quam M.D.   On: 12/09/2023 07:08   DG Elbow 2 Views Left Result Date: 12/09/2023 CLINICAL DATA:  Fall injury with blunt polytrauma. EXAM: PORTABLE CHEST 1 VIEW RIGHT SHOULDER 3 VIEWS LEFT ELBOW 2 VIEWS PORTABLE PELVIS 1 VIEW COMPARISON:  Chest AP portable 08/26/2022, right shoulder series 05/07/2010, CT abdomen pelvis 05/26/2009.  FINDINGS: Chest AP portable 6:38 a.m.: There is a tangle of overlying telemetry wiring. Intact midline sternotomy sutures with old CABG. Stable mild cardiomegaly. There is a left atrial appendage clip. No vascular congestion is seen. The mediastinum is stable. There is calcification in the aortic arch. The lungs are mildly emphysematous but clear. Osteopenia with no acute osseous findings. Right shoulder three views: Mild osteopenia. No evidence of fracture or dislocation. There is narrowing at the [facility] joint, moderate inferior AC joint osteophytes. The glenohumeral joint is unremarkable. No displaced fracture of the right upper ribcage. Soft tissues are unremarkable. AP and lateral left elbow: Normal bone mineralization. No evidence of fracture, dislocation or joint effusion. Narrowing and spurring is  noted on the medial trochleoulnar joint. Arthritic changes are not seen at the radiocapitellar joint. There is no other focal bone abnormality. Soft tissues are unremarkable. Portable pelvis: No evidence of pelvic fracture or diastasis. There is mild nonerosive arthrosis at the SI joints and hips. No other focal bone abnormality. There are marginal osteophytes in the lower lumbar spine. There are bilateral common iliac arterial stents with heavy iliofemoral calcific arteriosclerosis. Postsurgical changes prior ileocecectomy. IMPRESSION: 1. No evidence of acute chest disease. 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 2. No evidence of right shoulder fracture or dislocation. AC joint DJD. 3. No evidence of left elbow fracture or dislocation. 4. No evidence of pelvic fracture or diastasis. 5. Aortic and iliofemoral atherosclerosis. 6. Bilateral common iliac arterial stents. Electronically Signed   By: Francis Quam M.D.   On: 12/09/2023 07:08        Scheduled Meds:  apixaban   5 mg Oral BID   atorvastatin   80 mg Oral Daily   sodium chloride  flush  3 mL Intravenous Q12H   Continuous Infusions:  sodium  chloride            Tryton Bodi, MD [facility] 12/10/2023, 11:10 AM

## 2023-12-10 NOTE — Progress Notes (Signed)
 ABI has been completed.   Results can be found under chart review under CV PROC. 12/10/2023 12:32 PM Zubair Lofton RVT, RDMS

## 2023-12-10 NOTE — Progress Notes (Signed)
 Echocardiogram 2D Echocardiogram has been performed.  Tinnie FORBES Gosling RDCS 12/10/2023, 10:21 AM

## 2023-12-10 NOTE — TOC CM/SW Note (Signed)
 Transition of Care [facility]) - Inpatient Brief Assessment   Patient Details  Name: [name] MRN: [id number] Date of Birth: 29-Jan-1941  Transition of Care [facility]) CM/SW Contact:    Lauraine FORBES Saa, LCSWA Phone Number: 12/10/2023, 9:11 AM   Clinical Narrative:  9:11 AM Per chart review, patient resides at home with spouse. Patient has a PCP and insurance. Patient does not have SNF or HH history. Patient has DME (CPAP, shower stool) history with [facility] and [employer]. Patient's preferred pharmacy's are [facility] and [facility]. No TOC needs identified at this time. TOC will continue to follow and be available to assist.  Transition of Care Asessment: Insurance and Status: Insurance coverage has been reviewed Patient has primary care physician: Yes Home environment has been reviewed: Private Residence Prior level of function:: N/A Prior/Current Home Services: No current home services Social Drivers of Health Review: SDOH reviewed no interventions necessary Readmission risk has been reviewed: Yes (Currently Green 12%) Transition of care needs: no transition of care needs at this time

## 2023-12-10 NOTE — Plan of Care (Signed)

## 2023-12-10 NOTE — Progress Notes (Signed)
 DAILY PROGRESS NOTE   Patient Name: [name] Date of Encounter: 12/10/2023 Cardiologist: Dorn Lesches, MD  Chief Complaint   No issues overnight  Patient Profile   [name] is a 83 y.o. 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 Tobie.   Subjective   No issues overnight- spironolactone  and metolazone  held. Creatinine improved to 1.00 (From 1.30). Labs otherwise stable. Urine appeared concentrated with high specific gravity. He feels less dizzy today- still somewhat weak.  Objective   Vitals:   12/09/23 1946 12/09/23 2336 12/10/23 0436 12/10/23 0640  BP: (!) 123/57 (!) 129/44 104/78 (!) 115/47  Pulse:  (!) 44 (!) 48 (!) 50  Resp: 15 16 20 20   Temp: 98.5 F (36.9 C)  (!) 97.1 F (36.2 C) 98.1 F (36.7 C)  TempSrc: Oral  Oral Oral  SpO2:  97% 98% 98%  Weight: 89.9 kg     Height: 6' 3 (1.905 m)       Intake/Output Summary (Last 24 hours) at 12/10/2023 0801 Last data filed at 12/10/2023 0745 Gross per 24 hour  Intake 1478 ml  Output --  Net 1478 ml   Filed Weights   12/09/23 0636 12/09/23 1946  Weight: 88 kg 89.9 kg    Physical Exam   General appearance: alert and no distress Lungs: clear to auscultation bilaterally Heart: regular rate and rhythm Extremities: extremities normal, atraumatic, no cyanosis or edema Neurologic: Grossly normal  Inpatient Medications    Scheduled Meds:  apixaban   5 mg Oral BID   atorvastatin   80 mg Oral Daily   clopidogrel   75 mg Oral Daily   sodium chloride  flush  3 mL Intravenous  Q12H    Continuous Infusions:  sodium chloride       PRN Meds: sodium chloride , atropine , sodium chloride  flush   Labs   Results for orders placed or performed during the hospital encounter of 12/09/23 (from the past 48 hours)  Sample to Blood Bank     Status: None   Collection Time: 12/09/23  6:46 AM  Result Value Ref Range   Blood Bank Specimen SAMPLE AVAILABLE FOR TESTING    Sample Expiration      12/12/2023,2359 Performed at [facility], [address], [place], [place] [address]   Brain natriuretic peptide     Status: None   Collection Time: 12/09/23  6:52 AM  Result Value Ref Range   B Natriuretic Peptide 63.5 0.0 - 100.0 pg/mL    Comment: Performed at [facility], [address], [place], [place] [address]  Comprehensive metabolic panel     Status: Abnormal   Collection Time: 12/09/23  6:53 AM  Result Value Ref Range   Sodium 136 135 - 145 mmol/L   Potassium 3.9 3.5 - 5.1 mmol/L   Chloride 100 98 - 111 mmol/L   CO2 24 22 - 32 mmol/L   Glucose, Bld 116 (H) 70 - 99 mg/dL    Comment: Glucose reference range applies only to samples taken after fasting for at least 8 hours.  BUN 31 (H) 8 - 23 mg/dL   Creatinine, Ser 8.70 (H) 0.61 - 1.24 mg/dL   Calcium  9.5 8.9 - 10.3 mg/dL   Total Protein 6.6 6.5 - 8.1 g/dL   Albumin  4.1 3.5 - 5.0 g/dL   AST 36 15 - 41 U/L   ALT 35 0 - 44 U/L   Alkaline Phosphatase 71 38 - 126 U/L   Total Bilirubin 1.1 0.0 - 1.2 mg/dL   GFR, Estimated 56 (L) >60 mL/min    Comment: (NOTE) Calculated using the CKD-EPI Creatinine Equation (2021)    Anion gap 12 5 - 15    Comment: Performed at [facility], [address], [place], [place] [address]  CBC     Status: None   Collection Time: 12/09/23  6:53 AM  Result Value Ref Range   WBC 7.6 4.0 - 10.5 K/uL   RBC 4.42 4.22 - 5.81 MIL/uL   Hemoglobin 14.3 13.0 - 17.0 g/dL   HCT 57.2 60.9 - 47.9 %   MCV 96.6 80.0 - 100.0 fL   MCH 32.4 26.0 - 34.0 pg   MCHC 33.5 30.0 - 36.0  g/dL   RDW 86.2 88.4 - 84.4 %   Platelets 175 150 - 400 K/uL   nRBC 0.0 0.0 - 0.2 %    Comment: Performed at [facility], [address], [place], [place] [address]  Ethanol     Status: None   Collection Time: 12/09/23  6:53 AM  Result Value Ref Range   Alcohol , Ethyl (B) <15 <15 mg/dL    Comment: (NOTE) For medical purposes only. Performed at [facility], [address], [place], [place] [address]   Protime-INR     Status: None   Collection Time: 12/09/23  6:53 AM  Result Value Ref Range   Prothrombin Time 14.2 11.4 - 15.2 seconds   INR 1.0 0.8 - 1.2    Comment: (NOTE) INR goal varies based on device and disease states. Performed at [facility], [address] [place], [place] [address]   Troponin I (High Sensitivity)     Status: Abnormal   Collection Time: 12/09/23  6:53 AM  Result Value Ref Range   Troponin I (High Sensitivity) 24 (H) <18 ng/L    Comment: (NOTE) Elevated high sensitivity troponin I (hsTnI) values and significant  changes across serial measurements may suggest ACS but many other  chronic and acute conditions are known to elevate hsTnI results.  Refer to the Links section for chest pain algorithms and additional  guidance. Performed at [facility], [address], [place], [place] [address]   I-Stat Chem 8, ED     Status: Abnormal   Collection Time: 12/09/23  7:01 AM  Result Value Ref Range   Sodium 137 135 - 145 mmol/L   Potassium 3.9 3.5 - 5.1 mmol/L   Chloride 100 98 - 111 mmol/L   BUN 32 (H) 8 - 23 mg/dL   Creatinine, Ser 8.69 (H) 0.61 - 1.24 mg/dL   Glucose, Bld 884 (H) 70 - 99 mg/dL    Comment: Glucose reference range applies only to samples taken after fasting for at least 8 hours.   Calcium , Ion 1.19 1.15 - 1.40 mmol/L   TCO2 25 22 - 32 mmol/L   Hemoglobin 13.6 13.0 - 17.0 g/dL   HCT 59.9 60.9 - 47.9 %  I-Stat Lactic Acid, ED     Status: None   Collection Time:  12/09/23  7:01 AM  Result Value Ref Range    Lactic Acid, Venous 1.6 0.5 - 1.9 mmol/L  Troponin I (High Sensitivity)     Status: Abnormal   Collection Time: 12/09/23  9:01 AM  Result Value Ref Range   Troponin I (High Sensitivity) 49 (H) <18 ng/L    Comment: RESULT CALLED TO, READ BACK BY AND VERIFIED WITH MORGAN,EMILY RN 1054 12/09/23 AMIREHSANIF (NOTE) Elevated high sensitivity troponin I (hsTnI) values and significant  changes across serial measurements may suggest ACS but many other  chronic and acute conditions are known to elevate hsTnI results.  Refer to the Links section for chest pain algorithms and additional  guidance. Performed at [facility], [address] [place], [place] [address]   Urinalysis, Routine w reflex microscopic -Urine, Clean Catch     Status: Abnormal   Collection Time: 12/09/23  9:58 AM  Result Value Ref Range   Color, Urine YELLOW YELLOW   APPearance CLEAR CLEAR   Specific Gravity, Urine >1.046 (H) 1.005 - 1.030   pH 6.0 5.0 - 8.0   Glucose, UA 150 (A) NEGATIVE mg/dL   Hgb urine dipstick NEGATIVE NEGATIVE   Bilirubin Urine NEGATIVE NEGATIVE   Ketones, ur NEGATIVE NEGATIVE mg/dL   Protein, ur NEGATIVE NEGATIVE mg/dL   Nitrite NEGATIVE NEGATIVE   Leukocytes,Ua NEGATIVE NEGATIVE    Comment: Performed at [facility], [address], [place], [place] [address]  Troponin I (High Sensitivity)     Status: Abnormal   Collection Time: 12/09/23  1:19 PM  Result Value Ref Range   Troponin I (High Sensitivity) 85 (H) <18 ng/L    Comment: RESULT CALLED TO, READ BACK BY AND VERIFIED WITH E MORGAN RN 1600 12/09/2023 WBOND DELTA CHECK NOTED (NOTE) Elevated high sensitivity troponin I (hsTnI) values and significant  changes across serial measurements may suggest ACS but many other  chronic and acute conditions are known to elevate hsTnI results.  Refer to the Links section for chest pain algorithms and additional  guidance. Performed at [facility], [address]  [place], [place] [address]   Troponin I (High Sensitivity)     Status: Abnormal   Collection Time: 12/09/23  3:57 PM  Result Value Ref Range   Troponin I (High Sensitivity) 77 (H) <18 ng/L    Comment: DELTA CHECK NOTED CRITICAL RESULT CALLED TO, READ BACK BY AND VERIFIED WITH LAURAINE MUSTY RN 1732 12/09/2023 WBOND (NOTE) Elevated high sensitivity troponin I (hsTnI) values and significant  changes across serial measurements may suggest ACS but many other  chronic and acute conditions are known to elevate hsTnI results.  Refer to the Links section for chest pain algorithms and additional  guidance. Performed at [facility], [address], [place], [place] [address]   Magnesium      Status: None   Collection Time: 12/09/23  3:57 PM  Result Value Ref Range   Magnesium  2.3 1.7 - 2.4 mg/dL    Comment: Performed at [facility], [address], [place], [place] [address]  TSH     Status: None   Collection Time: 12/09/23  3:57 PM  Result Value Ref Range   TSH 1.488 0.350 - 4.500 uIU/mL    Comment: Performed by a 3rd Generation assay with a functional sensitivity of <=0.01 uIU/mL. Performed at [facility], [address], [place], [place] [address]   Surgical pcr screen     Status: None  Collection Time: 12/09/23  7:41 PM   Specimen: Nasal Mucosa; Nasal Swab  Result Value Ref Range   MRSA, PCR NEGATIVE NEGATIVE   Staphylococcus aureus NEGATIVE NEGATIVE    Comment: (NOTE) The Xpert SA Assay (FDA approved for NASAL specimens in patients 53 years of age and older), is one component of a comprehensive surveillance program. It is not intended to diagnose infection nor to guide or monitor treatment. Performed at [facility], [address] [place], [place] [address]   Comprehensive metabolic panel     Status: Abnormal   Collection Time: 12/10/23  2:51 AM  Result Value Ref Range   Sodium 136 135 - 145 mmol/L   Potassium 3.8 3.5 - 5.1 mmol/L   Chloride 98 98 -  111 mmol/L   CO2 27 22 - 32 mmol/L   Glucose, Bld 100 (H) 70 - 99 mg/dL    Comment: Glucose reference range applies only to samples taken after fasting for at least 8 hours.   BUN 23 8 - 23 mg/dL   Creatinine, Ser 8.99 0.61 - 1.24 mg/dL   Calcium  8.9 8.9 - 10.3 mg/dL   Total Protein 5.9 (L) 6.5 - 8.1 g/dL   Albumin  3.7 3.5 - 5.0 g/dL   AST 27 15 - 41 U/L   ALT 29 0 - 44 U/L   Alkaline Phosphatase 41 38 - 126 U/L   Total Bilirubin 1.9 (H) 0.0 - 1.2 mg/dL   GFR, Estimated >39 >39 mL/min    Comment: (NOTE) Calculated using the CKD-EPI Creatinine Equation (2021)    Anion gap 11 5 - 15    Comment: Performed at [facility], [address], [place], [place] [address]  CBC     Status: Abnormal   Collection Time: 12/10/23  2:51 AM  Result Value Ref Range   WBC 9.4 4.0 - 10.5 K/uL   RBC 4.06 (L) 4.22 - 5.81 MIL/uL   Hemoglobin 13.1 13.0 - 17.0 g/dL   HCT 60.5 60.9 - 47.9 %   MCV 97.0 80.0 - 100.0 fL   MCH 32.3 26.0 - 34.0 pg   MCHC 33.2 30.0 - 36.0 g/dL   RDW 86.0 88.4 - 84.4 %   Platelets 163 150 - 400 K/uL   nRBC 0.0 0.0 - 0.2 %    Comment: Performed at [facility], [address] [place], [place] [address]    ECG   N/A  Telemetry   Afib with slow VR in the 30's, short run of ventricular escape rhythm - Personally Reviewed  Radiology    DG Humerus Right Result Date: 12/09/2023 EXAM: 1 VIEW XRAY OF THE RIGHT HUMERUS 12/09/2023 08:14:00 AM COMPARISON: None available. CLINICAL HISTORY: Fall. Pt arrives via EMS for mechanical fall from home. FINDINGS: BONES AND JOINTS: No acute fracture. No focal osseous lesion. No joint dislocation. SOFT TISSUES: The soft tissues are unremarkable. IMPRESSION: 1. No evidence of acute traumatic injury. 2. Normal humerus radiographs. Electronically signed by: Waddell Calk MD 12/09/2023 08:25 AM EDT RP Workstation: [id number]   CT CHEST ABDOMEN PELVIS W CONTRAST Result Date: 12/09/2023 EXAM: CT CHEST, ABDOMEN AND PELVIS WITH CONTRAST  12/09/2023 07:15:30 AM TECHNIQUE: CT of the chest, abdomen and pelvis was performed with the administration of intravenous contrast. Multiplanar reformatted images are provided for review. Automated exposure control, iterative reconstruction, and/or weight based adjustment of the mA/kV was utilized to reduce the radiation dose to as low as reasonably achievable. COMPARISON: CT angio chest 04/05/2021.  CLINICAL HISTORY: Polytrauma, blunt. FINDINGS: CHEST: MEDIASTINUM AND LYMPH NODES: Status post median sternotomy and CABG procedure. Heart size normal. No pericardial effusion. LUNGS AND PLEURA: Mild changes of emphysema. No pleural fluid, interstitial edema or pneumothorax. Diffuse bronchial wall thickening identified. Calcified granuloma noted in the left lung base. There is persistent focal area of ground-glass attenuation in the central left upper lobe without solid component measuring 3.6 cm, image 87/9. On the previous exam this measured the same. ABDOMEN AND PELVIS: LIVER: Contour of the liver is slightly irregular which may reflect changes of early cirrhosis. GALLBLADDER AND BILE DUCTS: 8 mm gallstone. No gallbladder wall thickening or inflammation. SPLEEN: No acute abnormality. PANCREAS: No acute abnormality. ADRENAL GLANDS: No acute abnormality. KIDNEYS, URETERS AND BLADDER: No stones in the kidneys or ureters. No hydronephrosis. No perinephric or periureteral stranding. Urinary bladder is unremarkable. GI AND BOWEL: Mild stool burden noted within the colon and rectum. Status post right colectomy. There is no bowel obstruction. REPRODUCTIVE ORGANS: Prostate gland appears normal. PERITONEUM AND RETROPERITONEUM: No ascites. No free air. VASCULATURE: Aortic atherosclerotic calcification. ABDOMINAL AND PELVIS LYMPH NODES: No lymphadenopathy. BONES AND SOFT TISSUES: Lumbar spondylosis. No signs of acute fracture. IMPRESSION: 1. No evidence of acute traumatic injury within the chest, abdomen, or pelvis. . 2.  Persistent ground glass attenuating nodule within the central left upper lobe without solid component, measuring 3.6 cm, unchanged from the previous exam. Follow-up imaging in 12 months with repeat CT of the chest is recommended. 3. Slightly irregular liver contour, possibly reflecting early cirrhosis. 4. 8 mm gallstone. No gallbladder wall thickening or inflammation. 5. Status post right colectomy. Mild stool burden noted within the colon and rectum. 6. Aortic atherosclerotic calcification. 7. Mild emphysema. Electronically signed by: Waddell Calk MD 12/09/2023 07:36 AM EDT RP Workstation: [id number]   CT Head Wo Contrast Result Date: 12/09/2023 EXAM: CT HEAD AND CERVICAL SPINE 12/09/2023 06:56:34 AM TECHNIQUE: CT of the head and cervical spine was performed without the administration of intravenous contrast. Multiplanar reformatted images are provided for review. Automated exposure control, iterative reconstruction, and/or weight based adjustment of the mA/kV was utilized to reduce the radiation dose to as low as reasonably achievable. COMPARISON: CT head 07/03/2023 and CT cervical spine 01/28/2013. CLINICAL HISTORY: Facial trauma, blunt. No contrast. Pt arrives via ems for mechanical fall from home. Unsure what he fell on but possible furniture. Complaints of head pain where laceration is. Pt reports almost LOC, saw stars but was conscious the whole time; Approx 4in hematoma to back of head, on eliquis . Has numbness on legs intermittent that is normal, denies neck pain. FINDINGS: CT HEAD BRAIN AND VENTRICLES: No acute intracranial hemorrhage. No mass effect or midline shift. No abnormal extra-axial fluid collection. No evidence of acute infarct. No hydrocephalus. Hypoattenuating foci in the cerebral white matter, most likely representing chronic small vessel disease. Prominence of the sulci and ventricles compatible with brain atrophy. ORBITS: No acute abnormality. SINUSES AND MASTOIDS: No acute  abnormality. SOFT TISSUES AND SKULL: No acute skull fracture. Mild subcutaneous soft tissue stranding overlying the posterior scalp compatible with superficial contusion. CT CERVICAL SPINE BONES AND ALIGNMENT: No acute fracture. Retrolisthesis of C4 on C5 measures 3 mm. DEGENERATIVE CHANGES: Multilevel disc space narrowing and endplate spurring identified within the thoracic spine. This is most severe at C4-5 and C6-7 . SOFT TISSUES: No prevertebral soft tissue swelling. IMPRESSION: 1. No acute intracranial abnormality. 2. Chronic small vessel ischemic disease. 3. Cerebral atrophy. 4. Mild subcutaneous soft tissue stranding overlying the  posterior scalp compatible with superficial contusion. 5. No acute fracture or traumatic malalignment of the cervical spine. 6. Retrolisthesis of C4 on C5 (3 mm). 7. Multilevel cervical degenerative disc disease. Electronically signed by: Waddell Calk MD 12/09/2023 07:13 AM EDT RP Workstation: [id number]   CT Cervical Spine Wo Contrast Result Date: 12/09/2023 EXAM: CT HEAD AND CERVICAL SPINE 12/09/2023 06:56:34 AM TECHNIQUE: CT of the head and cervical spine was performed without the administration of intravenous contrast. Multiplanar reformatted images are provided for review. Automated exposure control, iterative reconstruction, and/or weight based adjustment of the mA/kV was utilized to reduce the radiation dose to as low as reasonably achievable. COMPARISON: CT head 07/03/2023 and CT cervical spine 01/28/2013. CLINICAL HISTORY: Facial trauma, blunt. No contrast. Pt arrives via ems for mechanical fall from home. Unsure what he fell on but possible furniture. Complaints of head pain where laceration is. Pt reports almost LOC, saw stars but was conscious the whole time; Approx 4in hematoma to back of head, on eliquis . Has numbness on legs intermittent that is normal, denies neck pain. FINDINGS: CT HEAD BRAIN AND VENTRICLES: No acute intracranial hemorrhage. No mass effect or  midline shift. No abnormal extra-axial fluid collection. No evidence of acute infarct. No hydrocephalus. Hypoattenuating foci in the cerebral white matter, most likely representing chronic small vessel disease. Prominence of the sulci and ventricles compatible with brain atrophy. ORBITS: No acute abnormality. SINUSES AND MASTOIDS: No acute abnormality. SOFT TISSUES AND SKULL: No acute skull fracture. Mild subcutaneous soft tissue stranding overlying the posterior scalp compatible with superficial contusion. CT CERVICAL SPINE BONES AND ALIGNMENT: No acute fracture. Retrolisthesis of C4 on C5 measures 3 mm. DEGENERATIVE CHANGES: Multilevel disc space narrowing and endplate spurring identified within the thoracic spine. This is most severe at C4-5 and C6-7 . SOFT TISSUES: No prevertebral soft tissue swelling. IMPRESSION: 1. No acute intracranial abnormality. 2. Chronic small vessel ischemic disease. 3. Cerebral atrophy. 4. Mild subcutaneous soft tissue stranding overlying the posterior scalp compatible with superficial contusion. 5. No acute fracture or traumatic malalignment of the cervical spine. 6. Retrolisthesis of C4 on C5 (3 mm). 7. Multilevel cervical degenerative disc disease. Electronically signed by: Waddell Calk MD 12/09/2023 07:13 AM EDT RP Workstation: [id number]   DG Chest Portable 1 View Result Date: 12/09/2023 CLINICAL DATA:  Fall injury with blunt polytrauma. EXAM: PORTABLE CHEST 1 VIEW RIGHT SHOULDER 3 VIEWS LEFT ELBOW 2 VIEWS PORTABLE PELVIS 1 VIEW COMPARISON:  Chest AP portable 08/26/2022, right shoulder series 05/07/2010, CT abdomen pelvis 05/26/2009. FINDINGS: Chest AP portable 6:38 a.m.: There is a tangle of overlying telemetry wiring. Intact midline sternotomy sutures with old CABG. Stable mild cardiomegaly. There is a left atrial appendage clip. No vascular congestion is seen. The mediastinum is stable. There is calcification in the aortic arch. The lungs are mildly emphysematous but clear.  Osteopenia with no acute osseous findings. Right shoulder three views: Mild osteopenia. No evidence of fracture or dislocation. There is narrowing at the [facility] joint, moderate inferior AC joint osteophytes. The glenohumeral joint is unremarkable. No displaced fracture of the right upper ribcage. Soft tissues are unremarkable. AP and lateral left elbow: Normal bone mineralization. No evidence of fracture, dislocation or joint effusion. Narrowing and spurring is noted on the medial trochleoulnar joint. Arthritic changes are not seen at the radiocapitellar joint. There is no other focal bone abnormality. Soft tissues are unremarkable. Portable pelvis: No evidence of pelvic fracture or diastasis. There is mild nonerosive arthrosis at the SI joints and hips. No other  focal bone abnormality. There are marginal osteophytes in the lower lumbar spine. There are bilateral common iliac arterial stents with heavy iliofemoral calcific arteriosclerosis. Postsurgical changes prior ileocecectomy. IMPRESSION: 1. No evidence of acute chest disease. 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 2. No evidence of right shoulder fracture or dislocation. AC joint DJD. 3. No evidence of left elbow fracture or dislocation. 4. No evidence of pelvic fracture or diastasis. 5. Aortic and iliofemoral atherosclerosis. 6. Bilateral common iliac arterial stents. Electronically Signed   By: Francis Quam M.D.   On: 12/09/2023 07:08   DG Pelvis Portable Result Date: 12/09/2023 CLINICAL DATA:  Fall injury with blunt polytrauma. EXAM: PORTABLE CHEST 1 VIEW RIGHT SHOULDER 3 VIEWS LEFT ELBOW 2 VIEWS PORTABLE PELVIS 1 VIEW COMPARISON:  Chest AP portable 08/26/2022, right shoulder series 05/07/2010, CT abdomen pelvis 05/26/2009. FINDINGS: Chest AP portable 6:38 a.m.: There is a tangle of overlying telemetry wiring. Intact midline sternotomy sutures with old CABG. Stable mild cardiomegaly. There is a left atrial appendage clip. No vascular congestion is  seen. The mediastinum is stable. There is calcification in the aortic arch. The lungs are mildly emphysematous but clear. Osteopenia with no acute osseous findings. Right shoulder three views: Mild osteopenia. No evidence of fracture or dislocation. There is narrowing at the [facility] joint, moderate inferior AC joint osteophytes. The glenohumeral joint is unremarkable. No displaced fracture of the right upper ribcage. Soft tissues are unremarkable. AP and lateral left elbow: Normal bone mineralization. No evidence of fracture, dislocation or joint effusion. Narrowing and spurring is noted on the medial trochleoulnar joint. Arthritic changes are not seen at the radiocapitellar joint. There is no other focal bone abnormality. Soft tissues are unremarkable. Portable pelvis: No evidence of pelvic fracture or diastasis. There is mild nonerosive arthrosis at the SI joints and hips. No other focal bone abnormality. There are marginal osteophytes in the lower lumbar spine. There are bilateral common iliac arterial stents with heavy iliofemoral calcific arteriosclerosis. Postsurgical changes prior ileocecectomy. IMPRESSION: 1. No evidence of acute chest disease. 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 2. No evidence of right shoulder fracture or dislocation. AC joint DJD. 3. No evidence of left elbow fracture or dislocation. 4. No evidence of pelvic fracture or diastasis. 5. Aortic and iliofemoral atherosclerosis. 6. Bilateral common iliac arterial stents. Electronically Signed   By: Francis Quam M.D.   On: 12/09/2023 07:08   DG Shoulder Right Result Date: 12/09/2023 CLINICAL DATA:  Fall injury with blunt polytrauma. EXAM: PORTABLE CHEST 1 VIEW RIGHT SHOULDER 3 VIEWS LEFT ELBOW 2 VIEWS PORTABLE PELVIS 1 VIEW COMPARISON:  Chest AP portable 08/26/2022, right shoulder series 05/07/2010, CT abdomen pelvis 05/26/2009. FINDINGS: Chest AP portable 6:38 a.m.: There is a tangle of overlying telemetry wiring. Intact midline  sternotomy sutures with old CABG. Stable mild cardiomegaly. There is a left atrial appendage clip. No vascular congestion is seen. The mediastinum is stable. There is calcification in the aortic arch. The lungs are mildly emphysematous but clear. Osteopenia with no acute osseous findings. Right shoulder three views: Mild osteopenia. No evidence of fracture or dislocation. There is narrowing at the [facility] joint, moderate inferior AC joint osteophytes. The glenohumeral joint is unremarkable. No displaced fracture of the right upper ribcage. Soft tissues are unremarkable. AP and lateral left elbow: Normal bone mineralization. No evidence of fracture, dislocation or joint effusion. Narrowing and spurring is noted on the medial trochleoulnar joint. Arthritic changes are not seen at the radiocapitellar joint. There is no other focal  bone abnormality. Soft tissues are unremarkable. Portable pelvis: No evidence of pelvic fracture or diastasis. There is mild nonerosive arthrosis at the SI joints and hips. No other focal bone abnormality. There are marginal osteophytes in the lower lumbar spine. There are bilateral common iliac arterial stents with heavy iliofemoral calcific arteriosclerosis. Postsurgical changes prior ileocecectomy. IMPRESSION: 1. No evidence of acute chest disease. 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 2. No evidence of right shoulder fracture or dislocation. AC joint DJD. 3. No evidence of left elbow fracture or dislocation. 4. No evidence of pelvic fracture or diastasis. 5. Aortic and iliofemoral atherosclerosis. 6. Bilateral common iliac arterial stents. Electronically Signed   By: Francis Quam M.D.   On: 12/09/2023 07:08   DG Elbow 2 Views Left Result Date: 12/09/2023 CLINICAL DATA:  Fall injury with blunt polytrauma. EXAM: PORTABLE CHEST 1 VIEW RIGHT SHOULDER 3 VIEWS LEFT ELBOW 2 VIEWS PORTABLE PELVIS 1 VIEW COMPARISON:  Chest AP portable 08/26/2022, right shoulder series 05/07/2010, CT  abdomen pelvis 05/26/2009. FINDINGS: Chest AP portable 6:38 a.m.: There is a tangle of overlying telemetry wiring. Intact midline sternotomy sutures with old CABG. Stable mild cardiomegaly. There is a left atrial appendage clip. No vascular congestion is seen. The mediastinum is stable. There is calcification in the aortic arch. The lungs are mildly emphysematous but clear. Osteopenia with no acute osseous findings. Right shoulder three views: Mild osteopenia. No evidence of fracture or dislocation. There is narrowing at the [facility] joint, moderate inferior AC joint osteophytes. The glenohumeral joint is unremarkable. No displaced fracture of the right upper ribcage. Soft tissues are unremarkable. AP and lateral left elbow: Normal bone mineralization. No evidence of fracture, dislocation or joint effusion. Narrowing and spurring is noted on the medial trochleoulnar joint. Arthritic changes are not seen at the radiocapitellar joint. There is no other focal bone abnormality. Soft tissues are unremarkable. Portable pelvis: No evidence of pelvic fracture or diastasis. There is mild nonerosive arthrosis at the SI joints and hips. No other focal bone abnormality. There are marginal osteophytes in the lower lumbar spine. There are bilateral common iliac arterial stents with heavy iliofemoral calcific arteriosclerosis. Postsurgical changes prior ileocecectomy. IMPRESSION: 1. No evidence of acute chest disease. 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 2. No evidence of right shoulder fracture or dislocation. AC joint DJD. 3. No evidence of left elbow fracture or dislocation. 4. No evidence of pelvic fracture or diastasis. 5. Aortic and iliofemoral atherosclerosis. 6. Bilateral common iliac arterial stents. Electronically Signed   By: Francis Quam M.D.   On: 12/09/2023 07:08    Cardiac Studies   Echo pending  Assessment   Principal Problem:   Syncope and collapse Active Problems:   Atrial fibrillation with slow  ventricular response (HCC)   Chronic anticoagulation   CAD S/P percutaneous coronary angioplasty   Essential hypertension   OSA treated with BiPAP   Chronic diastolic heart failure (HCC)   Plan   Feels less dizzy today- creatinine improved with hydration and holding diuretics. Repeat orthostatics today. Echo pending. Given slow afib and ventricular escape rhythm, have asked EP to again evaluate - ?need for loop recorder and to whether this was syncope or not. He thinks he just fell backward. I suspect this is orthostatic syncope, but could not r/o contribution of slow afib or ventricular rhythm. 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  Time Spent Directly with Patient:  I have spent a  total of 25 minutes with the patient reviewing hospital notes, telemetry, EKGs, labs and examining the patient as well as establishing an assessment and plan that was discussed personally with the patient.  > 50% of time was spent in direct patient care.  Length of Stay:  LOS: 1 day   Vinie KYM Maxcy, MD, [facility], FNLA, FACP  [place]  [facility]  Medical Director of the [facility]  [facility] Diplomate of the American Board of Clinical Lipidology Attending Cardiologist  Direct Dial: [phone]  Fax: [phone]  Website:  [email or web address] Vinie BROCKS Estefanny Moler 12/10/2023, 8:01 AM

## 2023-12-10 NOTE — Progress Notes (Signed)
 PT Cancellation Note  Patient Details Name: [name] MRN: [id number] DOB: [birth year]   Cancelled Treatment:    Reason Eval/Treat Not Completed: Patient declined, no reason specified. Pt declines the need for PT evaluation during this admission. Pt reports feeling somewhat weaker than normal but attributes this to not mobilizing in recent days. Pt denies concerns about mobility at this time. PT signing off. Pt is made aware that if mobility concerns arise he can request a therapy consultation.   Bernardino JINNY Ruth 12/10/2023, 5:03 PM

## 2023-12-10 NOTE — Consult Note (Addendum)
 ELECTROPHYSIOLOGY CONSULT NOTE    Patient ID: [name] MRN: [id number], DOB/AGE: 1940-08-11 83 y.o.  Admit date: 12/09/2023 Date of Consult: 12/10/2023  Primary Physician: Dwight Trula SQUIBB, MD Primary Cardiologist: Dorn Lesches, MD  Electrophysiologist: Dr. Inocencio   Referring Provider: Dr. Mona  Patient Profile: [name] is a 83 y.o. 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 Mona.  HPI:  [name] is a 83 y.o. male with above medical history who presented to the hospital yesterday after a fall at home. Patient was leaving his bathroom after a shower and lost his balance while putting on his shirt, resulting in a backwards fall with impact to back/head. Patient denies any prodromal symptoms including dizziness/lightheadedness. Patient did not have LOC but EMS was called due to patient's inability to get off the floor.   Earlier this year, patient presented to [facility] ED with a brief episode of dizziness, different than the fall that prompted this admission. Patient has previously been seen by EP in the setting of near syncope (June 2024) with permanent atrial fibrillation/chronic bradycardia. That episode of syncope was felt to be driven by dehydration with diuretics and PPM was NOT recommended as HR reliably increased into the 80s with exertion.    Patient regularly goes to the gym and is able to exercise without any physical limitation (treadmill and stationary bike for >30 min with HR >80bpm). He reports weight loss of about 44lbs over the last year  (unintentional).    Labs Potassium3.8 (09/23 0251) Magnesium   2.3 (09/22 1557) Creatinine, ser  1.00 (09/23 0251) PLT  163 (09/23 0251) HGB  13.1 (09/23 0251) WBC 9.4 (09/23 0251) Troponin I (High Sensitivity)77* (09/22 1557).    Past Medical History:  Diagnosis Date   Allergy    Seasonal--pollen   Bradycardia    CAD (coronary artery disease)    Carotid stenosis, asymptomatic    Chronic heart failure with preserved ejection fraction (HFpEF) (HCC)    Colon cancer (HCC) 04/2006   Stage III--T3 N1   COPD (chronic obstructive pulmonary disease) (HCC)    Hand foot syndrome    Hemorrhoids    Hyperlipidemia    Hypertension    Neuropathy    OSA treated with BiPAP    severe OSA with an AHI fo 45/hr and O2 sats as low as 85%. On auto BiPAP   perm    Permanent atrial fibrillation (HCC)    Psoriasis    Pulmonary hypertension (HCC)    Right bundle branch block (RBBB) with left anterior fascicular block (LAFB)    Tricuspid regurgitation    Tubular adenoma of colon      Surgical History:  Past Surgical History:  Procedure Laterality Date   ABDOMINAL AORTOGRAM W/LOWER EXTREMITY N/A 12/21/2021   Procedure: ABDOMINAL AORTOGRAM W/LOWER EXTREMITY;  Surgeon: Lesches Dorn PARAS, MD;  Location: [facility];  Service: Cardiovascular;  Laterality: N/A;   CARDIAC CATHETERIZATION     CLIPPING OF ATRIAL APPENDAGE  01/28/2018   Procedure: CLIPPING OF ATRIAL APPENDAGE;  Surgeon: Lucas Dorise POUR, MD;  Location:  [facility] OR;  Service: Open Heart Surgery;;   COLONOSCOPY     CORONARY ARTERY BYPASS GRAFT N/A 01/28/2018   Procedure: CORONARY ARTERY BYPASS GRAFTING (CABG) X 4 USING LEFT INTERNAL MAMMARY ARTERY AND LEFT GREATER SAPHENOUS VEIN HARVESTED ENDOSCOPICALLY;  Surgeon: Lucas Dorise POUR, MD;  Location: [facility] OR;  Service: Open Heart Surgery;  Laterality: N/A;   CORONARY STENT PLACEMENT  1999   RCA tandem NIR stents   LEFT HEART CATH AND CORONARY ANGIOGRAPHY N/A 01/23/2018   Procedure: LEFT HEART  CATH AND CORONARY ANGIOGRAPHY;  Surgeon: Mady Bruckner, MD;  Location: [facility];  Service: Cardiovascular;  Laterality: N/A;   PERIPHERAL VASCULAR ATHERECTOMY Bilateral 12/21/2021   Procedure: PERIPHERAL VASCULAR ATHERECTOMY;  Surgeon: Court Dorn PARAS, MD;  Location: [facility];  Service: Cardiovascular;  Laterality: Bilateral;  common illiac   PERIPHERAL VASCULAR INTERVENTION Bilateral 12/21/2021   Procedure: PERIPHERAL VASCULAR INTERVENTION;  Surgeon: Court Dorn PARAS, MD;  Location: [facility];  Service: Cardiovascular;  Laterality: Bilateral;  Common Illiac[place]   RIGHT COLECTOMY  05/29/2006   Laparoscopic assisted   RIGHT HEART CATH N/A 06/14/2021   Procedure: RIGHT HEART CATH;  Surgeon: Cherrie Toribio SAUNDERS, MD;  Location: [facility];  Service: Cardiovascular;  Laterality: N/A;   TEE WITHOUT CARDIOVERSION N/A 01/28/2018   Procedure: TRANSESOPHAGEAL ECHOCARDIOGRAM (TEE);  Surgeon: Lucas Dorise POUR, MD;  [facility];  Service: Open Heart Surgery;  Laterality: N/A;   TONSILLECTOMY       Medications Prior to Admission  Medication Sig Dispense Refill Last Dose/Taking   Alpha-Lipoic Acid 600 MG TABS Take 1,200 mg by mouth daily.   12/08/2023   apixaban  (ELIQUIS ) 5 MG TABS tablet Take 5 mg by mouth 2 (two) times daily.   12/08/2023 at  7:00 PM   atorvastatin  (LIPITOR ) 80 MG tablet TAKE ONE TABLET AT SIX IN THE EVENING. 90 tablet 2 12/08/2023   cholecalciferol  (VITAMIN D ) 1000 UNITS tablet Take 1,000 Units by mouth daily.   12/08/2023   cilostazol  (PLETAL ) 50 MG tablet Take 1 tablet (50 mg total) by mouth 2 (two) times daily. 180 tablet 1 12/08/2023   clopidogrel  (PLAVIX ) 75 MG tablet TAKE ONE TAB DAILY WITH BREAKFAST 90 tablet 2 12/08/2023   Coenzyme Q10 200 MG capsule Take 200 mg by mouth daily.    12/08/2023   Cyanocobalamin  (VITAMIN B 12 PO) Take 200 mcg by mouth daily.   12/08/2023   dapagliflozin  propanediol (FARXIGA ) 10 MG TABS tablet Take 1 tablet (10 mg total)  by mouth daily. 90 tablet 2 12/08/2023   Flaxseed, Linseed, 1000 MG CAPS Take 1,000 mg by mouth daily.   12/08/2023   furosemide  (LASIX ) 40 MG tablet Take 1 tablet (40 mg total) by mouth 2 (two) times daily. 60 tablet 2 12/08/2023   Magnesium  Citrate 200 MG TABS Take 400 mg by mouth daily in the afternoon.   12/08/2023   metolazone  (ZAROXOLYN ) 2.5 MG tablet Take 1 tablet by mouth on Monday, Wednesday, and Friday 30 tablet 11 Past Week   Polyethyl Glycol-Propyl Glycol (SYSTANE) 0.4-0.3 % GEL ophthalmic gel Place 1 application. into both eyes every 8 (eight) hours as needed (dry eyes).   Unknown   potassium chloride  SA (KLOR-CON  M) 20 MEQ tablet Take 1 tablet (20 mEq total) by mouth 2 (two) times daily. 90 tablet 3 12/08/2023   pyridoxine (B-6) 200 MG tablet Take 200 mg by mouth daily.   12/08/2023   spironolactone  (ALDACTONE ) 25 MG tablet Take  1 tablet (25 mg total) by mouth daily. 90 tablet 3 12/08/2023   vitamin C (ASCORBIC ACID) 500 MG tablet Take 500 mg by mouth 3 (three) times daily.    12/08/2023   Zinc 50 MG TABS Take 1 tablet by mouth daily.   12/08/2023    Inpatient Medications:   apixaban   5 mg Oral BID   atorvastatin   80 mg Oral Daily   clopidogrel   75 mg Oral Daily   sodium chloride  flush  3 mL Intravenous Q12H    Allergies: No Known Allergies  Family History  Problem Relation Age of Onset   Heart disease Father 58   Heart disease Paternal Grandmother    Heart disease Mother    Hypertension Mother    Healthy Sister    Heart disease Paternal Grandfather    Hypertension Paternal Grandfather    Colon cancer Neg Hx      Physical Exam: Vitals:   12/09/23 1946 12/09/23 2336 12/10/23 0436 12/10/23 0640  BP: (!) 123/57 (!) 129/44 104/78 (!) 115/47  Pulse:  (!) 44 (!) 48 (!) 50  Resp: 15 16 20 20   Temp: 98.5 F (36.9 C)  (!) 97.1 F (36.2 C) 98.1 F (36.7 C)  TempSrc: Oral  Oral Oral  SpO2:  97% 98% 98%  Weight: 89.9 kg     Height: 6' 3 (1.905 m)       GEN- NAD, A&O x 3,  normal affect HEENT: Normocephalic, atraumatic Lungs- CTAB, Normal effort.  Heart- Irregularly irregular rate and rhythm, No M/G/R.  GI- Soft, NT, ND.  Extremities- No clubbing, cyanosis, or edema   Radiology/Studies: DG Humerus Right Result Date: 12/09/2023 EXAM: 1 VIEW XRAY OF THE RIGHT HUMERUS 12/09/2023 08:14:00 AM COMPARISON: None available. CLINICAL HISTORY: Fall. Pt arrives via EMS for mechanical fall from home. FINDINGS: BONES AND JOINTS: No acute fracture. No focal osseous lesion. No joint dislocation. SOFT TISSUES: The soft tissues are unremarkable. IMPRESSION: 1. No evidence of acute traumatic injury. 2. Normal humerus radiographs. Electronically signed by: Waddell Calk MD 12/09/2023 08:25 AM EDT RP Workstation: [id number]   CT CHEST ABDOMEN PELVIS W CONTRAST Result Date: 12/09/2023 EXAM: CT CHEST, ABDOMEN AND PELVIS WITH CONTRAST 12/09/2023 07:15:30 AM TECHNIQUE: CT of the chest, abdomen and pelvis was performed with the administration of intravenous contrast. Multiplanar reformatted images are provided for review. Automated exposure control, iterative reconstruction, and/or weight based adjustment of the mA/kV was utilized to reduce the radiation dose to as low as reasonably achievable. COMPARISON: CT angio chest 04/05/2021. CLINICAL HISTORY: Polytrauma, blunt. FINDINGS: CHEST: MEDIASTINUM AND LYMPH NODES: Status post median sternotomy and CABG procedure. Heart size normal. No pericardial effusion. LUNGS AND PLEURA: Mild changes of emphysema. No pleural fluid, interstitial edema or pneumothorax. Diffuse bronchial wall thickening identified. Calcified granuloma noted in the left lung base. There is persistent focal area of ground-glass attenuation in the central left upper lobe without solid component measuring 3.6 cm, image 87/9. On the previous exam this measured the same. ABDOMEN AND PELVIS: LIVER: Contour of the liver is slightly irregular which may reflect changes of early cirrhosis.  GALLBLADDER AND BILE DUCTS: 8 mm gallstone. No gallbladder wall thickening or inflammation. SPLEEN: No acute abnormality. PANCREAS: No acute abnormality. ADRENAL GLANDS: No acute abnormality. KIDNEYS, URETERS AND BLADDER: No stones in the kidneys or ureters. No hydronephrosis. No perinephric or periureteral stranding. Urinary bladder is unremarkable. GI AND BOWEL: Mild stool burden noted within the colon and rectum. Status post right colectomy. There is no bowel  obstruction. REPRODUCTIVE ORGANS: Prostate gland appears normal. PERITONEUM AND RETROPERITONEUM: No ascites. No free air. VASCULATURE: Aortic atherosclerotic calcification. ABDOMINAL AND PELVIS LYMPH NODES: No lymphadenopathy. BONES AND SOFT TISSUES: Lumbar spondylosis. No signs of acute fracture. IMPRESSION: 1. No evidence of acute traumatic injury within the chest, abdomen, or pelvis. . 2. Persistent ground glass attenuating nodule within the central left upper lobe without solid component, measuring 3.6 cm, unchanged from the previous exam. Follow-up imaging in 12 months with repeat CT of the chest is recommended. 3. Slightly irregular liver contour, possibly reflecting early cirrhosis. 4. 8 mm gallstone. No gallbladder wall thickening or inflammation. 5. Status post right colectomy. Mild stool burden noted within the colon and rectum. 6. Aortic atherosclerotic calcification. 7. Mild emphysema. Electronically signed by: Waddell Calk MD 12/09/2023 07:36 AM EDT RP Workstation: [id number]   CT Head Wo Contrast Result Date: 12/09/2023 EXAM: CT HEAD AND CERVICAL SPINE 12/09/2023 06:56:34 AM TECHNIQUE: CT of the head and cervical spine was performed without the administration of intravenous contrast. Multiplanar reformatted images are provided for review. Automated exposure control, iterative reconstruction, and/or weight based adjustment of the mA/kV was utilized to reduce the radiation dose to as low as reasonably achievable. COMPARISON: CT head  07/03/2023 and CT cervical spine 01/28/2013. CLINICAL HISTORY: Facial trauma, blunt. No contrast. Pt arrives via ems for mechanical fall from home. Unsure what he fell on but possible furniture. Complaints of head pain where laceration is. Pt reports almost LOC, saw stars but was conscious the whole time; Approx 4in hematoma to back of head, on eliquis . Has numbness on legs intermittent that is normal, denies neck pain. FINDINGS: CT HEAD BRAIN AND VENTRICLES: No acute intracranial hemorrhage. No mass effect or midline shift. No abnormal extra-axial fluid collection. No evidence of acute infarct. No hydrocephalus. Hypoattenuating foci in the cerebral white matter, most likely representing chronic small vessel disease. Prominence of the sulci and ventricles compatible with brain atrophy. ORBITS: No acute abnormality. SINUSES AND MASTOIDS: No acute abnormality. SOFT TISSUES AND SKULL: No acute skull fracture. Mild subcutaneous soft tissue stranding overlying the posterior scalp compatible with superficial contusion. CT CERVICAL SPINE BONES AND ALIGNMENT: No acute fracture. Retrolisthesis of C4 on C5 measures 3 mm. DEGENERATIVE CHANGES: Multilevel disc space narrowing and endplate spurring identified within the thoracic spine. This is most severe at C4-5 and C6-7 . SOFT TISSUES: No prevertebral soft tissue swelling. IMPRESSION: 1. No acute intracranial abnormality. 2. Chronic small vessel ischemic disease. 3. Cerebral atrophy. 4. Mild subcutaneous soft tissue stranding overlying the posterior scalp compatible with superficial contusion. 5. No acute fracture or traumatic malalignment of the cervical spine. 6. Retrolisthesis of C4 on C5 (3 mm). 7. Multilevel cervical degenerative disc disease. Electronically signed by: Waddell Calk MD 12/09/2023 07:13 AM EDT RP Workstation: [id number]   CT Cervical Spine Wo Contrast Result Date: 12/09/2023 EXAM: CT HEAD AND CERVICAL SPINE 12/09/2023 06:56:34 AM TECHNIQUE: CT of the  head and cervical spine was performed without the administration of intravenous contrast. Multiplanar reformatted images are provided for review. Automated exposure control, iterative reconstruction, and/or weight based adjustment of the mA/kV was utilized to reduce the radiation dose to as low as reasonably achievable. COMPARISON: CT head 07/03/2023 and CT cervical spine 01/28/2013. CLINICAL HISTORY: Facial trauma, blunt. No contrast. Pt arrives via ems for mechanical fall from home. Unsure what he fell on but possible furniture. Complaints of head pain where laceration is. Pt reports almost LOC, saw stars but was conscious the whole time; Approx 4in hematoma  to back of head, on eliquis . Has numbness on legs intermittent that is normal, denies neck pain. FINDINGS: CT HEAD BRAIN AND VENTRICLES: No acute intracranial hemorrhage. No mass effect or midline shift. No abnormal extra-axial fluid collection. No evidence of acute infarct. No hydrocephalus. Hypoattenuating foci in the cerebral white matter, most likely representing chronic small vessel disease. Prominence of the sulci and ventricles compatible with brain atrophy. ORBITS: No acute abnormality. SINUSES AND MASTOIDS: No acute abnormality. SOFT TISSUES AND SKULL: No acute skull fracture. Mild subcutaneous soft tissue stranding overlying the posterior scalp compatible with superficial contusion. CT CERVICAL SPINE BONES AND ALIGNMENT: No acute fracture. Retrolisthesis of C4 on C5 measures 3 mm. DEGENERATIVE CHANGES: Multilevel disc space narrowing and endplate spurring identified within the thoracic spine. This is most severe at C4-5 and C6-7 . SOFT TISSUES: No prevertebral soft tissue swelling. IMPRESSION: 1. No acute intracranial abnormality. 2. Chronic small vessel ischemic disease. 3. Cerebral atrophy. 4. Mild subcutaneous soft tissue stranding overlying the posterior scalp compatible with superficial contusion. 5. No acute fracture or traumatic malalignment  of the cervical spine. 6. Retrolisthesis of C4 on C5 (3 mm). 7. Multilevel cervical degenerative disc disease. Electronically signed by: Waddell Calk MD 12/09/2023 07:13 AM EDT RP Workstation: [id number]   DG Chest Portable 1 View Result Date: 12/09/2023 CLINICAL DATA:  Fall injury with blunt polytrauma. EXAM: PORTABLE CHEST 1 VIEW RIGHT SHOULDER 3 VIEWS LEFT ELBOW 2 VIEWS PORTABLE PELVIS 1 VIEW COMPARISON:  Chest AP portable 08/26/2022, right shoulder series 05/07/2010, CT abdomen pelvis 05/26/2009. FINDINGS: Chest AP portable 6:38 a.m.: There is a tangle of overlying telemetry wiring. Intact midline sternotomy sutures with old CABG. Stable mild cardiomegaly. There is a left atrial appendage clip. No vascular congestion is seen. The mediastinum is stable. There is calcification in the aortic arch. The lungs are mildly emphysematous but clear. Osteopenia with no acute osseous findings. Right shoulder three views: Mild osteopenia. No evidence of fracture or dislocation. There is narrowing at the [facility] joint, moderate inferior AC joint osteophytes. The glenohumeral joint is unremarkable. No displaced fracture of the right upper ribcage. Soft tissues are unremarkable. AP and lateral left elbow: Normal bone mineralization. No evidence of fracture, dislocation or joint effusion. Narrowing and spurring is noted on the medial trochleoulnar joint. Arthritic changes are not seen at the radiocapitellar joint. There is no other focal bone abnormality. Soft tissues are unremarkable. Portable pelvis: No evidence of pelvic fracture or diastasis. There is mild nonerosive arthrosis at the SI joints and hips. No other focal bone abnormality. There are marginal osteophytes in the lower lumbar spine. There are bilateral common iliac arterial stents with heavy iliofemoral calcific arteriosclerosis. Postsurgical changes prior ileocecectomy. IMPRESSION: 1. No evidence of acute chest disease. Stable post CABG chest with mild  cardiomegaly. 2. No evidence of right shoulder fracture or dislocation. AC joint DJD. 3. No evidence of left elbow fracture or dislocation. 4. No evidence of pelvic fracture or diastasis. 5. Aortic and iliofemoral atherosclerosis. 6. Bilateral common iliac arterial stents. Electronically Signed   By: Francis Quam M.D.   On: 12/09/2023 07:08   DG Pelvis Portable Result Date: 12/09/2023 CLINICAL DATA:  Fall injury with blunt polytrauma. EXAM: PORTABLE CHEST 1 VIEW RIGHT SHOULDER 3 VIEWS LEFT ELBOW 2 VIEWS PORTABLE PELVIS 1 VIEW COMPARISON:  Chest AP portable 08/26/2022, right shoulder series 05/07/2010, CT abdomen pelvis 05/26/2009. FINDINGS: Chest AP portable 6:38 a.m.: There is a tangle of overlying telemetry wiring. Intact midline sternotomy sutures with old CABG. Stable  mild cardiomegaly. There is a left atrial appendage clip. No vascular congestion is seen. The mediastinum is stable. There is calcification in the aortic arch. The lungs are mildly emphysematous but clear. Osteopenia with no acute osseous findings. Right shoulder three views: Mild osteopenia. No evidence of fracture or dislocation. There is narrowing at the [facility] joint, moderate inferior AC joint osteophytes. The glenohumeral joint is unremarkable. No displaced fracture of the right upper ribcage. Soft tissues are unremarkable. AP and lateral left elbow: Normal bone mineralization. No evidence of fracture, dislocation or joint effusion. Narrowing and spurring is noted on the medial trochleoulnar joint. Arthritic changes are not seen at the radiocapitellar joint. There is no other focal bone abnormality. Soft tissues are unremarkable. Portable pelvis: No evidence of pelvic fracture or diastasis. There is mild nonerosive arthrosis at the SI joints and hips. No other focal bone abnormality. There are marginal osteophytes in the lower lumbar spine. There are bilateral common iliac arterial stents with heavy iliofemoral calcific arteriosclerosis.  Postsurgical changes prior ileocecectomy. IMPRESSION: 1. No evidence of acute chest disease. 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 2. No evidence of right shoulder fracture or dislocation. AC joint DJD. 3. No evidence of left elbow fracture or dislocation. 4. No evidence of pelvic fracture or diastasis. 5. Aortic and iliofemoral atherosclerosis. 6. Bilateral common iliac arterial stents. Electronically Signed   By: Francis Quam M.D.   On: 12/09/2023 07:08   DG Shoulder Right Result Date: 12/09/2023 CLINICAL DATA:  Fall injury with blunt polytrauma. EXAM: PORTABLE CHEST 1 VIEW RIGHT SHOULDER 3 VIEWS LEFT ELBOW 2 VIEWS PORTABLE PELVIS 1 VIEW COMPARISON:  Chest AP portable 08/26/2022, right shoulder series 05/07/2010, CT abdomen pelvis 05/26/2009. FINDINGS: Chest AP portable 6:38 a.m.: There is a tangle of overlying telemetry wiring. Intact midline sternotomy sutures with old CABG. Stable mild cardiomegaly. There is a left atrial appendage clip. No vascular congestion is seen. The mediastinum is stable. There is calcification in the aortic arch. The lungs are mildly emphysematous but clear. Osteopenia with no acute osseous findings. Right shoulder three views: Mild osteopenia. No evidence of fracture or dislocation. There is narrowing at the [facility] joint, moderate inferior AC joint osteophytes. The glenohumeral joint is unremarkable. No displaced fracture of the right upper ribcage. Soft tissues are unremarkable. AP and lateral left elbow: Normal bone mineralization. No evidence of fracture, dislocation or joint effusion. Narrowing and spurring is noted on the medial trochleoulnar joint. Arthritic changes are not seen at the radiocapitellar joint. There is no other focal bone abnormality. Soft tissues are unremarkable. Portable pelvis: No evidence of pelvic fracture or diastasis. There is mild nonerosive arthrosis at the SI joints and hips. No other focal bone abnormality. There are marginal osteophytes in  the lower lumbar spine. There are bilateral common iliac arterial stents with heavy iliofemoral calcific arteriosclerosis. Postsurgical changes prior ileocecectomy. IMPRESSION: 1. No evidence of acute chest disease. 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 2. No evidence of right shoulder fracture or dislocation. AC joint DJD. 3. No evidence of left elbow fracture or dislocation. 4. No evidence of pelvic fracture or diastasis. 5. Aortic and iliofemoral atherosclerosis. 6. Bilateral common iliac arterial stents. Electronically Signed   By: Francis Quam M.D.   On: 12/09/2023 07:08   DG Elbow 2 Views Left Result Date: 12/09/2023 CLINICAL DATA:  Fall injury with blunt polytrauma. EXAM: PORTABLE CHEST 1 VIEW RIGHT SHOULDER 3 VIEWS LEFT ELBOW 2 VIEWS PORTABLE PELVIS 1 VIEW COMPARISON:  Chest AP portable 08/26/2022,  right shoulder series 05/07/2010, CT abdomen pelvis 05/26/2009. FINDINGS: Chest AP portable 6:38 a.m.: There is a tangle of overlying telemetry wiring. Intact midline sternotomy sutures with old CABG. Stable mild cardiomegaly. There is a left atrial appendage clip. No vascular congestion is seen. The mediastinum is stable. There is calcification in the aortic arch. The lungs are mildly emphysematous but clear. Osteopenia with no acute osseous findings. Right shoulder three views: Mild osteopenia. No evidence of fracture or dislocation. There is narrowing at the [facility] joint, moderate inferior AC joint osteophytes. The glenohumeral joint is unremarkable. No displaced fracture of the right upper ribcage. Soft tissues are unremarkable. AP and lateral left elbow: Normal bone mineralization. No evidence of fracture, dislocation or joint effusion. Narrowing and spurring is noted on the medial trochleoulnar joint. Arthritic changes are not seen at the radiocapitellar joint. There is no other focal bone abnormality. Soft tissues are unremarkable. Portable pelvis: No evidence of pelvic fracture or diastasis.  There is mild nonerosive arthrosis at the SI joints and hips. No other focal bone abnormality. There are marginal osteophytes in the lower lumbar spine. There are bilateral common iliac arterial stents with heavy iliofemoral calcific arteriosclerosis. Postsurgical changes prior ileocecectomy. IMPRESSION: 1. No evidence of acute chest disease. 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 2. No evidence of right shoulder fracture or dislocation. AC joint DJD. 3. No evidence of left elbow fracture or dislocation. 4. No evidence of pelvic fracture or diastasis. 5. Aortic and iliofemoral atherosclerosis. 6. Bilateral common iliac arterial stents. Electronically Signed   By: Francis Quam M.D.   On: 12/09/2023 07:08    EKG: Atrial fibrillation with HR ~47bpm. 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 (personally reviewed)  TELEMETRY: permanent atrial fibrillation with ventricular rates high 30s-40s at night while asleep. Rates increase into upper 40s/50s with patient awake. Intermittent rate increases into the 80s. Rare ventricular escape beats. (personally reviewed)  DEVICE HISTORY: N/A  Assessment/Plan:  Permanent atrial fibrillation Chronic bradycardia Loss of balance/fall Patient with permanent afib and known chronic bradycardia. HR this admission on telemetry ~40s with intermittent rate increase into the 80s. Average HR in line with prior Zio patch from 2024. By patient's description of recent fall, sounds very mechanical in nature. Furthermore, patient regularly tolerates physical activity at the gym with clear chronotropic competence (rides stationary bike and walks on treadmill 5 days per week). No indication for PPM or more intensive cardiac monitoring at this time. Patient prefers to avoid device if possible. If there are future concerns for clear symptomatic bradycardia, would be a good candidate for PPM. Discussed warning symptoms with patient.  Continue Eliquis  Reinforce importance of nightly BiPAP  compliance  Chronic HFpEF Metolazone  and Spironolactone  on hold with positive orthostatics.  Management per cardiology  CAD s/p PCI and CABG Minimally elevated troponin this admission. Continue statin therapy per cardiology   EP Tiara Bartoli see as needed.     For questions or updates, please contact [facility] Please consult [email or web address] for contact info under     Signed, Artist Pouch, PA-C  12/10/2023, 7:42 AM      [name] was seen by me today along with Artist Pouch. I have personally performed an evaluation on this patient.  My findings are as follows: 83 y.o. male patient with a past medical history as above.  He presented to the hospital yesterday after a fall at home.  He was leaving his bathroom after shower.  He lost his balance while pulling off his shirt and  fell backwards hitting his back and head.  He denies prodromal symptoms.  He states very clearly that he was putting his shirt on and walking and tripped.  He had no loss of consciousness.  EMS was called as he was unable to get up off the floor.  Per the patient, he has had slow heart rates in the past.  When he exercises at the gym, he can increase his heart rate into the 80s or 90s.  Data: EKG(s) and pertinent labs, studies, etc were personally reviewed and interpreted by me:  EKG, telemetry, labs Otherwise, I agree with data as outlined by the advanced practice provider.  Exam performed by me: Gen: No acute distress Neck: None JVD Cardiac: Bradycardic, regular Lungs: Normal work of breathing Extremities: No edema  My Assessment and Plan:  1.  Permanent atrial fibrillation with slow ventricular response: Patient had a recent fall.  Patient states that the fall was mechanical and that he did not lose consciousness.  He is able to exercise and can increase his heart rate to the 80s or 90s.  For now, would hold off on any further EP evaluation.  Would continue his Eliquis .  2.  Chronic  diastolic heart failure: On metolazone  and Aldactone  at home.  Holding due to orthostatics.  Management per cardiology.  3.  Coronary artery disease: No current chest pain.  Plan per primary team.  Signed,  Oniel Meleski Gladis Norton, MD  12/10/2023 12:47 PM

## 2023-12-11 DIAGNOSIS — I272 Pulmonary hypertension, unspecified: Secondary | ICD-10-CM

## 2023-12-11 DIAGNOSIS — R55 Syncope and collapse: Secondary | ICD-10-CM | POA: Diagnosis not present

## 2023-12-11 DIAGNOSIS — R42 Dizziness and giddiness: Secondary | ICD-10-CM

## 2023-12-11 DIAGNOSIS — I5032 Chronic diastolic (congestive) heart failure: Secondary | ICD-10-CM | POA: Diagnosis not present

## 2023-12-11 MED ORDER — FUROSEMIDE 40 MG PO TABS
40.0000 mg | ORAL_TABLET | Freq: Every day | ORAL | Status: DC
  Administered 2023-12-11: 40 mg via ORAL
  Filled 2023-12-11: qty 1

## 2023-12-11 MED ORDER — DAPAGLIFLOZIN PROPANEDIOL 10 MG PO TABS
10.0000 mg | ORAL_TABLET | Freq: Every day | ORAL | Status: DC
  Administered 2023-12-11: 10 mg via ORAL
  Filled 2023-12-11: qty 1

## 2023-12-11 MED ORDER — FUROSEMIDE 40 MG PO TABS: 40.0000 mg | ORAL_TABLET | Freq: Every day | ORAL | 2 refills | Status: DC

## 2023-12-11 MED ORDER — SPIRONOLACTONE 25 MG PO TABS
25.0000 mg | ORAL_TABLET | Freq: Every day | ORAL | Status: DC
  Administered 2023-12-11: 25 mg via ORAL
  Filled 2023-12-11: qty 1

## 2023-12-11 NOTE — Progress Notes (Signed)
 DAILY PROGRESS NOTE   Patient Name: [name] Date of Encounter: 12/11/2023 Cardiologist: Dorn Lesches, MD  Chief Complaint   No issues overnight  Patient Profile   [name] is a 83 y.o. 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 Tobie.   Subjective   No issues overnight- orthostatics negative yesterday. He denies dizziness and wants to go home.  Appreciate EP evaluation - they do not feel that bradycardia was implicated in his fall and that this was not true syncope. No driving restrictions therefore. Holding diuretics given recent weight loss. Echo yesterday showed normal LVEF 60-65% with indeterminate diastolic function and biatrial enlargement. There was mild RV dysfunction and moderate pulmonary hypertension. LE arterial dopplers show non-compressible vessels, which likely indicates diffuse calcification. He has known PAD with prior intervention, but has not recently reported issues with claudication.  Objective   Vitals:   12/10/23 1947 12/10/23 2322 12/11/23 0500 12/11/23 0556  BP: (!) 135/57 100/87 120/69 (!) 115/41  Pulse: (!) 42 (!) 41 (!) 47 (!) 41  Resp: 20 20 17 19   Temp: 98.7 F (37.1 C) 98.1 F (36.7 C) 97.9 F (36.6 C) 98.1 F (36.7 C)  TempSrc: Oral Oral Oral Oral  SpO2: 98% 97% 96% 95%  Weight:   89.7 kg   Height:        Intake/Output Summary (Last 24 hours) at 12/11/2023 9164 Last data filed at 12/10/2023 2000 Gross per 24 hour  Intake 720 ml  Output --  Net 720 ml   Filed Weights   12/09/23 0636  12/09/23 1946 12/11/23 0500  Weight: 88 kg 89.9 kg 89.7 kg    Physical Exam   General appearance: alert and no distress Lungs: clear to auscultation bilaterally Heart: regular rate and rhythm Extremities: extremities normal, atraumatic, no cyanosis or edema Neurologic: Grossly normal  Inpatient Medications    Scheduled Meds:  apixaban   5 mg Oral BID   atorvastatin   80 mg Oral Daily   sodium chloride  flush  3 mL Intravenous Q12H    Continuous Infusions:    PRN Meds: atropine , sodium chloride  flush   Labs   Results for orders placed or performed during the hospital encounter of 12/09/23 (from the past 48 hours)  Troponin I (High Sensitivity)     Status: Abnormal   Collection Time: 12/09/23  9:01 AM  Result Value Ref Range   Troponin I (High Sensitivity) 49 (H) <18 ng/L    Comment: RESULT CALLED TO, READ BACK BY AND VERIFIED WITH MORGAN,EMILY RN 1054 12/09/23 AMIREHSANIF (NOTE) Elevated high sensitivity troponin I (hsTnI) values and significant  changes across serial measurements may suggest ACS but many other  chronic and acute conditions are known to elevate hsTnI results.  Refer to the Links section for chest pain algorithms and additional  guidance. Performed at [facility], [address] [place], [place] [address]   Urinalysis, Routine w reflex microscopic -Urine, Clean Catch     Status: Abnormal   Collection Time: 12/09/23  9:58 AM  Result Value Ref Range  Color, Urine YELLOW YELLOW   APPearance CLEAR CLEAR   Specific Gravity, Urine >1.046 (H) 1.005 - 1.030   pH 6.0 5.0 - 8.0   Glucose, UA 150 (A) NEGATIVE mg/dL   Hgb urine dipstick NEGATIVE NEGATIVE   Bilirubin Urine NEGATIVE NEGATIVE   Ketones, ur NEGATIVE NEGATIVE mg/dL   Protein, ur NEGATIVE NEGATIVE mg/dL   Nitrite NEGATIVE NEGATIVE   Leukocytes,Ua NEGATIVE NEGATIVE    Comment: Performed at [facility], [address] [place], [place] [address]  Troponin I (High Sensitivity)      Status: Abnormal   Collection Time: 12/09/23  1:19 PM  Result Value Ref Range   Troponin I (High Sensitivity) 85 (H) <18 ng/L    Comment: RESULT CALLED TO, READ BACK BY AND VERIFIED WITH E MORGAN RN 1600 12/09/2023 WBOND DELTA CHECK NOTED (NOTE) Elevated high sensitivity troponin I (hsTnI) values and significant  changes across serial measurements may suggest ACS but many other  chronic and acute conditions are known to elevate hsTnI results.  Refer to the Links section for chest pain algorithms and additional  guidance. Performed at [facility], [address], [place], [place] [address]   Troponin I (High Sensitivity)     Status: Abnormal   Collection Time: 12/09/23  3:57 PM  Result Value Ref Range   Troponin I (High Sensitivity) 77 (H) <18 ng/L    Comment: DELTA CHECK NOTED CRITICAL RESULT CALLED TO, READ BACK BY AND VERIFIED WITH LAURAINE MUSTY RN 1732 12/09/2023 WBOND (NOTE) Elevated high sensitivity troponin I (hsTnI) values and significant  changes across serial measurements may suggest ACS but many other  chronic and acute conditions are known to elevate hsTnI results.  Refer to the Links section for chest pain algorithms and additional  guidance. Performed at [facility], [address] [place], [place] [address]   Magnesium      Status: None   Collection Time: 12/09/23  3:57 PM  Result Value Ref Range   Magnesium  2.3 1.7 - 2.4 mg/dL    Comment: Performed at [facility], [address], [place], [place] [address]  TSH     Status: None   Collection Time: 12/09/23  3:57 PM  Result Value Ref Range   TSH 1.488 0.350 - 4.500 uIU/mL    Comment: Performed by a 3rd Generation assay with a functional sensitivity of <=0.01 uIU/mL. Performed at [facility], [address] [place], [place] [address]   Surgical pcr screen     Status: None   Collection Time: 12/09/23  7:41 PM   Specimen: Nasal Mucosa; Nasal Swab  Result Value Ref Range   MRSA,  PCR NEGATIVE NEGATIVE   Staphylococcus aureus NEGATIVE NEGATIVE    Comment: (NOTE) The Xpert SA Assay (FDA approved for NASAL specimens in patients 35 years of age and older), is one component of a comprehensive surveillance program. It is not intended to diagnose infection nor to guide or monitor treatment. Performed at [facility], [address] [place], [place] [address]   Comprehensive metabolic panel     Status: Abnormal   Collection Time: 12/10/23  2:51 AM  Result Value Ref Range   Sodium 136 135 - 145 mmol/L   Potassium 3.8 3.5 - 5.1 mmol/L   Chloride 98 98 - 111 mmol/L   CO2 27 22 - 32 mmol/L   Glucose, Bld 100 (H) 70 - 99 mg/dL    Comment: Glucose reference range  applies only to samples taken after fasting for at least 8 hours.   BUN 23 8 - 23 mg/dL   Creatinine, Ser 8.99 0.61 - 1.24 mg/dL   Calcium  8.9 8.9 - 10.3 mg/dL   Total Protein 5.9 (L) 6.5 - 8.1 g/dL   Albumin  3.7 3.5 - 5.0 g/dL   AST 27 15 - 41 U/L   ALT 29 0 - 44 U/L   Alkaline Phosphatase 41 38 - 126 U/L   Total Bilirubin 1.9 (H) 0.0 - 1.2 mg/dL   GFR, Estimated >39 >39 mL/min    Comment: (NOTE) Calculated using the CKD-EPI Creatinine Equation (2021)    Anion gap 11 5 - 15    Comment: Performed at [facility], [address], [place], [place] [address]  CBC     Status: Abnormal   Collection Time: 12/10/23  2:51 AM  Result Value Ref Range   WBC 9.4 4.0 - 10.5 K/uL   RBC 4.06 (L) 4.22 - 5.81 MIL/uL   Hemoglobin 13.1 13.0 - 17.0 g/dL   HCT 60.5 60.9 - 47.9 %   MCV 97.0 80.0 - 100.0 fL   MCH 32.3 26.0 - 34.0 pg   MCHC 33.2 30.0 - 36.0 g/dL   RDW 86.0 88.4 - 84.4 %   Platelets 163 150 - 400 K/uL   nRBC 0.0 0.0 - 0.2 %    Comment: Performed at [facility], [address] [place], [place] [address]    ECG   N/A  Telemetry   Afib with slow to normal VR  - Personally Reviewed  Radiology    VAS US  ABI WITH/WO TBI Result Date: 12/10/2023  LOWER EXTREMITY DOPPLER STUDY  Patient Name:  DRACO MALCZEWSKI  Date of Exam:   12/10/2023 Medical Rec #: [id number]         Accession #:    [id number] Date of Birth: Feb 08, 1941        Patient Gender: M Patient Age:   89 years Exam Location:  [facility] Procedure:      VAS US  ABI WITH/WO TBI Referring Phys: EKTA PATEL --------------------------------------------------------------------------------  High Risk Factors: Hypertension, hyperlipidemia, past history of smoking, prior                    MI, coronary artery disease. Other Factors: Afib, CHF, s/p CABG, carotid stenosis,.  Vascular Interventions: 10/5/2023bilateral common iliac orbital atherectomy and                         stenting. Comparison Study: Previous exam on 06/19/2023 Performing Technologist: Leigh Rom RVT/RDMS  Examination Guidelines: A complete evaluation includes at minimum, Doppler waveform signals and systolic blood pressure reading at the level of bilateral brachial, anterior tibial, and posterior tibial arteries, when vessel segments are accessible. Bilateral testing is considered an integral part of a complete examination. Photoelectric Plethysmograph (PPG) waveforms and toe systolic pressure readings are included as required and additional duplex testing as needed. Limited examinations for reoccurring indications may be performed as noted.  ABI Findings: +---------+------------------+-----+----------+--------+ Right    Rt Pressure (mmHg)IndexWaveform  Comment  +---------+------------------+-----+----------+--------+ Brachial 132                    triphasic          +---------+------------------+-----+----------+--------+ PTA      196               1.48 monophasic         +---------+------------------+-----+----------+--------+  DP       88                0.67 monophasic         +---------+------------------+-----+----------+--------+ Great Toe40                0.30 Abnormal            +---------+------------------+-----+----------+--------+ +---------+------------------+-----+----------+-------+ Left     Lt Pressure (mmHg)IndexWaveform  Comment +---------+------------------+-----+----------+-------+ Brachial                                  bandage +---------+------------------+-----+----------+-------+ PTA      184               1.39 monophasic        +---------+------------------+-----+----------+-------+ DP       152               1.15 monophasic        +---------+------------------+-----+----------+-------+ Great Toe41                0.31 Abnormal          +---------+------------------+-----+----------+-------+ +-------+-----------+-----------+------------+------------+ ABI/TBIToday's ABIToday's TBIPrevious ABIPrevious TBI +-------+-----------+-----------+------------+------------+ Right  [place] (1.48)  0.30       [place]          0.39         +-------+-----------+-----------+------------+------------+ Left   [place] (1.39)  0.31       [place]          0.45         +-------+-----------+-----------+------------+------------+  Summary: Right: Resting right ankle-brachial index indicates noncompressible right lower extremity arteries. The right toe-brachial index is abnormal.  Left: Resting left ankle-brachial index indicates noncompressible left lower extremity arteries. The left toe-brachial index is abnormal.  *See table(s) above for measurements and observations.  Electronically signed by Debby Robertson on 12/10/2023 at 5:15:31 PM.    Final    ECHOCARDIOGRAM COMPLETE Result Date: 12/10/2023    ECHOCARDIOGRAM REPORT   Patient Name:   MYSHAWN CHIRIBOGA Date of Exam: 12/10/2023 Medical Rec #:  [id number]        Height:       75.0 in Accession #:    [id number]       Weight:       198.2 lb Date of Birth:  31-Oct-1940       BSA:          2.186 m Patient Age:    82 years         BP:           115/47 mmHg Patient Gender: M                HR:           50 bpm. Exam  Location:  Inpatient Procedure: 2D Echo, Cardiac Doppler and Color Doppler (Both Spectral and Color            Flow Doppler were utilized during procedure). Indications:    Bradycardia  History:        Patient has prior history of Echocardiogram examinations, most                 recent 08/27/2022. CAD, Prior CABG, PAD; Risk                 Factors:Hypertension.  Sonographer:    Tinnie Gosling Referring Phys: 35 DAYNA N DUNN IMPRESSIONS  1. Left  ventricular ejection fraction, by estimation, is 60 to 65%. The left ventricle has normal function. The left ventricle has no regional wall motion abnormalities. There is mild left ventricular hypertrophy. Left ventricular diastolic parameters are indeterminate.  2. Midl decrease in basilar excursion. Right ventricular systolic function is mildly reduced. The right ventricular size is severely enlarged. There is moderately elevated pulmonary artery systolic pressure. The estimated right ventricular systolic pressure is 55.0 mmHg.  3. Left atrial size was moderately dilated.  4. Right atrial size was severely dilated.  5. The mitral valve is degenerative. No evidence of mitral valve regurgitation. No evidence of mitral stenosis. Moderate mitral annular calcification.  6. The aortic valve is tricuspid. There is mild calcification of the aortic valve. Aortic valve regurgitation is not visualized. Aortic valve sclerosis is present, with no evidence of aortic valve stenosis.  7. Aortic dilatation noted. There is mild dilatation of the ascending aorta, measuring 40 mm. Comparison(s): Prior images reviewed side by side. Right ventricle has further dilated. FINDINGS  Left Ventricle: Left ventricular ejection fraction, by estimation, is 60 to 65%. The left ventricle has normal function. The left ventricle has no regional wall motion abnormalities. The left ventricular internal cavity size was normal in size. There is  mild left ventricular hypertrophy. Left ventricular diastolic  function could not be evaluated due to mitral annular calcification (moderate or greater). Left ventricular diastolic parameters are indeterminate. Right Ventricle: Midl decrease in basilar excursion. The right ventricular size is severely enlarged. No increase in right ventricular wall thickness. Right ventricular systolic function is mildly reduced. There is moderately elevated pulmonary artery systolic pressure. The tricuspid regurgitant velocity is 2.96 m/s, and with an assumed right atrial pressure of 20 mmHg, the estimated right ventricular systolic pressure is 55.0 mmHg. Left Atrium: Left atrial size was moderately dilated. Right Atrium: Right atrial size was severely dilated. Pericardium: There is no evidence of pericardial effusion. Mitral Valve: The mitral valve is degenerative in appearance. Moderate mitral annular calcification. No evidence of mitral valve regurgitation. No evidence of mitral valve stenosis. Tricuspid Valve: The tricuspid valve is normal in structure. Tricuspid valve regurgitation is mild . No evidence of tricuspid stenosis. Aortic Valve: The aortic valve is tricuspid. There is mild calcification of the aortic valve. Aortic valve regurgitation is not visualized. Aortic valve sclerosis is present, with no evidence of aortic valve stenosis. Pulmonic Valve: The pulmonic valve was not well visualized. Pulmonic valve regurgitation is not visualized. Aorta: Aortic dilatation noted. There is mild dilatation of the ascending aorta, measuring 40 mm. IAS/Shunts: No atrial level shunt detected by color flow Doppler.  LEFT VENTRICLE PLAX 2D LVIDd:         4.80 cm   Diastology LVIDs:         2.60 cm   LV e' medial:   6.57 cm/s LV PW:         1.20 cm   LV E/e' medial: 15.7 LV IVS:        1.20 cm LVOT diam:     2.20 cm LV SV:         90 LV SV Index:   41 LVOT Area:     3.80 cm  RIGHT VENTRICLE            IVC RV S prime:     8.19 cm/s  IVC diam: 3.20 cm TAPSE (M-mode): 1.0 cm LEFT ATRIUM               Index  RIGHT ATRIUM           Index LA diam:        4.70 cm  2.15 cm/m   RA Area:     38.30 cm LA Vol (A2C):   63.7 ml  29.14 ml/m  RA Volume:   173.00 ml 79.14 ml/m LA Vol (A4C):   101.0 ml 46.20 ml/m LA Biplane Vol: 89.3 ml  40.85 ml/m  AORTIC VALVE LVOT Vmax:   95.00 cm/s LVOT Vmean:  67.000 cm/s LVOT VTI:    0.238 m  AORTA Ao Root diam: 3.50 cm Ao Asc diam:  4.00 cm MITRAL VALVE                TRICUSPID VALVE MV Area (PHT): 2.37 cm     TR Peak grad:   35.0 mmHg MV Decel Time: 320 msec     TR Vmax:        296.00 cm/s MV E velocity: 103.00 cm/s                             SHUNTS                             Systemic VTI:  0.24 m                             Systemic Diam: 2.20 cm Stanly Leavens MD Electronically signed by Stanly Leavens MD Signature Date/Time: 12/10/2023/4:40:03 PM    Final     Cardiac Studies   See echo above  Assessment   Principal Problem:   Syncope and collapse Active Problems:   Atrial fibrillation with slow ventricular response (HCC)   Chronic anticoagulation   CAD S/P percutaneous coronary angioplasty   Essential hypertension   OSA treated with BiPAP   Chronic diastolic heart failure [facility])   Plan   Mr. Stthomas is overall feeling well. Not dizzy today. Orthostatics yesterday were negative. Echo shows moderate pulmonary hypertension with mild RV Systolic dysfunction and preserved LVEF.  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 Was previously on lasix  40 mg BID, aldactone  and metolazone  M, W, F. Would recommend restarting lower dose lasix  40 mg daily, farxiga  10 mg daily and spironolactone  for HFPEF, but not metolazone  at discharge. Should follow-up with Dr. Court and advanced HF clinic.  OK to d/c home today from cardiac standpoint.  Time Spent Directly with Patient:  I have spent a total of 25 minutes with the patient reviewing hospital notes, telemetry, EKGs, labs and examining the patient as well as establishing an  assessment and plan that was discussed personally with the patient.  > 50% of time was spent in direct patient care.  Length of Stay:  LOS: 2 days   Vinie KYM Maxcy, MD, [facility], FNLA, FACP  [place]  [facility]  Medical Director of the Advanced Lipid Disorders &  Cardiovascular Risk Reduction Clinic Diplomate of the American Board of Clinical Lipidology Attending Cardiologist  Direct Dial: [phone]  Fax: [phone]  Website:  [email or web address]  Vinie JAYSON Maxcy 12/11/2023, 8:35 AM

## 2023-12-11 NOTE — Discharge Summary (Signed)
 Physician Discharge Summary   Patient: [name] MRN: [id number] DOB: 06/10/1940  Admit date:     12/09/2023  Discharge date: 12/11/23  Discharge Physician: [name]   PCP: [name]   Recommendations at discharge:    Follow up with your PCP in one week Follow up outpatient with cardiologist, [name] and advanced HF clinic Continue taking meds as prescribed Check BP and heart rate daily.  Discharge Diagnoses: Principal Problem:   Syncope and collapse Active Problems:   Atrial fibrillation with slow ventricular response (HCC)   Chronic anticoagulation   CAD S/P percutaneous coronary angioplasty   Essential hypertension   OSA treated with BiPAP   Chronic diastolic heart failure [facility])   Hospital Course:   83 y.o. 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  Patient denied dizziness, chest pain or palpitations.   Patient found to have bradycardia in 40s with chronic A-fib.  Troponin mildly elevated, peaked at 85.  Creatinine 1.25 with baseline 0.7-1.0.  Fall likely mechanical or orthostatic hypotension, patient admitted for further observation and cardiology/EP evaluation given bradycardia with fall.  Echo showed normal LVEF 60-65% with indeterminate diastolic function and biatrial enlargement. There was mild RV dysfunction and moderate pulmonary hypertension. LE arterial dopplers show non-compressible vessels, which likely indicates diffuse calcification. He has known PAD with prior intervention, but has not recently reported issues with claudication.   Evaluated by cardiology. Recommendations are to take lasix  40 mg daily (instead of BID) and continue with farxiga  and spironolactone  but discontinue metolazone .  Discussed  with patient's wife and a close friend at the bedside on the discharge day.  He was alert, awake, oriented x 3 and was ambulating in his Keller without assistance at the time of the discharge.  He will follow-up with [name]. Court and advanced HF clinic.        Consultants: Cardiology Procedures performed: None  Disposition: Home Diet recommendation:  Cardiac diet DISCHARGE MEDICATION: Allergies as of 12/11/2023   No Known Allergies      Medication List     STOP taking these medications    clopidogrel  75 MG tablet Commonly known as: PLAVIX    metolazone  2.5 MG tablet Commonly known as: ZAROXOLYN        TAKE these medications    Alpha-Lipoic Acid 600 MG Tabs Take 1,200 mg by mouth daily.   apixaban  5 MG Tabs tablet Commonly known as: ELIQUIS  Take 5 mg by mouth 2 (two) times daily.   ascorbic acid 500 MG tablet Commonly known as: VITAMIN C Take 500 mg by mouth 3 (three) times daily.   atorvastatin  80 MG tablet Commonly known as: LIPITOR  TAKE ONE TABLET AT SIX IN THE EVENING.   cholecalciferol  1000 units tablet Commonly known as: VITAMIN D  Take 1,000 Units by mouth daily.   cilostazol  50 MG tablet Commonly known as: PLETAL  Take 1 tablet (50 mg total) by mouth 2 (two) times daily.   Coenzyme Q10 200 MG capsule Take 200 mg by mouth daily.   Farxiga  10 MG Tabs tablet Generic drug: dapagliflozin  propanediol Take 1 tablet (10 mg total) by mouth daily.   Flaxseed (Linseed) 1000 MG Caps Take 1,000 mg by mouth daily.   furosemide  40 MG tablet Commonly  known as: LASIX  Take 1 tablet (40 mg total) by mouth daily. What changed: when to take this   Magnesium  Citrate 200 MG Tabs Take 400 mg by mouth daily in the afternoon.   potassium chloride  SA 20 MEQ tablet Commonly known as: KLOR-CON  M Take 1 tablet (20 mEq total) by mouth 2 (two) times daily.   pyridoxine 200 MG tablet Commonly known as: B-6 Take 200 mg by mouth daily.   spironolactone  25 MG  tablet Commonly known as: ALDACTONE  Take 1 tablet (25 mg total) by mouth daily.   Systane 0.4-0.3 % Gel ophthalmic gel Generic drug: Polyethyl Glycol-Propyl Glycol Place 1 application. into both eyes every 8 (eight) hours as needed (dry eyes).   VITAMIN B 12 PO Take 200 mcg by mouth daily.   Zinc 50 MG Tabs Take 1 tablet by mouth daily.        Follow-up Information     [name]. Schedule an appointment as soon as possible for a visit in 2 days.   Specialty: Internal Medicine Why: As needed Contact information: [address] [place] [address] [phone]         [name]. Call in 2 week(s).   Specialties: Cardiology, Radiology Contact information: [address] [place] [address] [phone]                Discharge Exam: [name]   12/09/23 0636 12/09/23 1946 12/11/23 0500  Weight: 88 kg 89.9 kg 89.7 kg   Constitutional: NAD, calm, comfortable Eyes: PERRL, lids and conjunctivae normal ENMT: Mucous membranes are moist. Posterior pharynx clear of any exudate or lesions.Normal dentition.  Neck: normal, supple, no masses, no thyromegaly Respiratory: clear to auscultation bilaterally, no wheezing, no crackles. Normal respiratory effort. No accessory muscle use.  Cardiovascular: Regular rate and rhythm, no murmurs / rubs / gallops. No extremity edema. 2+ pedal pulses. No carotid bruits.  Abdomen: no tenderness, no masses palpated. No hepatosplenomegaly. Bowel sounds positive.  Musculoskeletal: no clubbing / cyanosis. No joint deformity upper and lower extremities. Good ROM, no contractures. Normal muscle tone.  Skin: no rashes, lesions, ulcers. No induration Neurologic: CN 2-12 grossly intact. Sensation intact, DTR normal. Strength 5/5 x all 4 extremities.  Psychiatric: Normal judgment and insight. Alert and oriented x 3. Normal mood.    Condition at discharge: good  The results of significant  diagnostics from this hospitalization (including imaging, microbiology, ancillary and laboratory) are listed below for reference.   Imaging Studies: VAS US  ABI WITH/WO TBI Result Date: 12/10/2023  LOWER EXTREMITY DOPPLER STUDY Patient Name:  [name]  Date of Exam:   12/10/2023 Medical Rec #: [id number]         Accession #:    [id number] Date of Birth: 17-Apr-1940        Patient Gender: M Patient Age:   60 years Exam Location:  [facility] Procedure:      VAS US  ABI WITH/WO TBI Referring Phys: EKTA PATEL --------------------------------------------------------------------------------  High Risk Factors: Hypertension, hyperlipidemia, past history of smoking, prior                    MI, coronary artery disease. Other Factors: Afib, CHF, s/p CABG, carotid stenosis,.  Vascular Interventions: 10/5/2023bilateral common iliac orbital atherectomy and                         stenting. Comparison Study: Previous exam on 06/19/2023  Performing Technologist: Leigh Rom RVT/RDMS  Examination Guidelines: A complete evaluation includes at minimum, Doppler waveform signals and systolic blood pressure reading at the level of bilateral brachial, anterior tibial, and posterior tibial arteries, when vessel segments are accessible. Bilateral testing is considered an integral part of a complete examination. Photoelectric Plethysmograph (PPG) waveforms and toe systolic pressure readings are included as required and additional duplex testing as needed. Limited examinations for reoccurring indications may be performed as noted.  ABI Findings: +---------+------------------+-----+----------+--------+ Right    Rt Pressure (mmHg)IndexWaveform  Comment  +---------+------------------+-----+----------+--------+ Brachial 132                    triphasic          +---------+------------------+-----+----------+--------+ PTA      196               1.48 monophasic          +---------+------------------+-----+----------+--------+ DP       88                0.67 monophasic         +---------+------------------+-----+----------+--------+ Great Toe40                0.30 Abnormal           +---------+------------------+-----+----------+--------+ +---------+------------------+-----+----------+-------+ Left     Lt Pressure (mmHg)IndexWaveform  Comment +---------+------------------+-----+----------+-------+ Brachial                                  bandage +---------+------------------+-----+----------+-------+ PTA      184               1.39 monophasic        +---------+------------------+-----+----------+-------+ DP       152               1.15 monophasic        +---------+------------------+-----+----------+-------+ Great Toe41                0.31 Abnormal          +---------+------------------+-----+----------+-------+ +-------+-----------+-----------+------------+------------+ ABI/TBIToday's ABIToday's TBIPrevious ABIPrevious TBI +-------+-----------+-----------+------------+------------+ Right  [place] (1.48)  0.30       [place]          0.39         +-------+-----------+-----------+------------+------------+ Left   [place] (1.39)  0.31       [place]          0.45         +-------+-----------+-----------+------------+------------+  Summary: Right: Resting right ankle-brachial index indicates noncompressible right lower extremity arteries. The right toe-brachial index is abnormal.  Left: Resting left ankle-brachial index indicates noncompressible left lower extremity arteries. The left toe-brachial index is abnormal.  *See table(s) above for measurements and observations.  Electronically signed by Debby Robertson on 12/10/2023 at 5:15:31 PM.    Final    ECHOCARDIOGRAM COMPLETE Result Date: 12/10/2023    ECHOCARDIOGRAM REPORT   Patient Name:   SEDRICK TOBER Date of Exam: 12/10/2023 Medical Rec #:  [id number]        Height:       75.0 in Accession  #:    [id number]       Weight:       198.2 lb Date of Birth:  Apr 30, 1940       BSA:          2.186 m Patient Age:    23 years  BP:           115/47 mmHg Patient Gender: M                HR:           50 bpm. Exam Location:  Inpatient Procedure: 2D Echo, Cardiac Doppler and Color Doppler (Both Spectral and Color            Flow Doppler were utilized during procedure). Indications:    Bradycardia  History:        Patient has prior history of Echocardiogram examinations, most                 recent 08/27/2022. CAD, Prior CABG, PAD; Risk                 Factors:Hypertension.  Sonographer:    Tinnie Gosling Referring Phys: 49 DAYNA N DUNN IMPRESSIONS  1. Left ventricular ejection fraction, by estimation, is 60 to 65%. The left ventricle has normal function. The left ventricle has no regional wall motion abnormalities. There is mild left ventricular hypertrophy. Left ventricular diastolic parameters are indeterminate.  2. Midl decrease in basilar excursion. Right ventricular systolic function is mildly reduced. The right ventricular size is severely enlarged. There is moderately elevated pulmonary artery systolic pressure. The estimated right ventricular systolic pressure is 55.0 mmHg.  3. Left atrial size was moderately dilated.  4. Right atrial size was severely dilated.  5. The mitral valve is degenerative. No evidence of mitral valve regurgitation. No evidence of mitral stenosis. Moderate mitral annular calcification.  6. The aortic valve is tricuspid. There is mild calcification of the aortic valve. Aortic valve regurgitation is not visualized. Aortic valve sclerosis is present, with no evidence of aortic valve stenosis.  7. Aortic dilatation noted. There is mild dilatation of the ascending aorta, measuring 40 mm. Comparison(s): Prior images reviewed side by side. Right ventricle has further dilated. FINDINGS  Left Ventricle: Left ventricular ejection fraction, by estimation, is 60 to 65%. The left ventricle  has normal function. The left ventricle has no regional wall motion abnormalities. The left ventricular internal cavity size was normal in size. There is  mild left ventricular hypertrophy. Left ventricular diastolic function could not be evaluated due to mitral annular calcification (moderate or greater). Left ventricular diastolic parameters are indeterminate. Right Ventricle: Midl decrease in basilar excursion. The right ventricular size is severely enlarged. No increase in right ventricular wall thickness. Right ventricular systolic function is mildly reduced. There is moderately elevated pulmonary artery systolic pressure. The tricuspid regurgitant velocity is 2.96 m/s, and with an assumed right atrial pressure of 20 mmHg, the estimated right ventricular systolic pressure is 55.0 mmHg. Left Atrium: Left atrial size was moderately dilated. Right Atrium: Right atrial size was severely dilated. Pericardium: There is no evidence of pericardial effusion. Mitral Valve: The mitral valve is degenerative in appearance. Moderate mitral annular calcification. No evidence of mitral valve regurgitation. No evidence of mitral valve stenosis. Tricuspid Valve: The tricuspid valve is normal in structure. Tricuspid valve regurgitation is mild . No evidence of tricuspid stenosis. Aortic Valve: The aortic valve is tricuspid. There is mild calcification of the aortic valve. Aortic valve regurgitation is not visualized. Aortic valve sclerosis is present, with no evidence of aortic valve stenosis. Pulmonic Valve: The pulmonic valve was not well visualized. Pulmonic valve regurgitation is not visualized. Aorta: Aortic dilatation noted. There is mild dilatation of the ascending aorta, measuring 40 mm. IAS/Shunts: No atrial level shunt detected by color  flow Doppler.  LEFT VENTRICLE PLAX 2D LVIDd:         4.80 cm   Diastology LVIDs:         2.60 cm   LV e' medial:   6.57 cm/s LV PW:         1.20 cm   LV E/e' medial: 15.7 LV IVS:         1.20 cm LVOT diam:     2.20 cm LV SV:         90 LV SV Index:   41 LVOT Area:     3.80 cm  RIGHT VENTRICLE            IVC RV S prime:     8.19 cm/s  IVC diam: 3.20 cm TAPSE (M-mode): 1.0 cm LEFT ATRIUM              Index        RIGHT ATRIUM           Index LA diam:        4.70 cm  2.15 cm/m   RA Area:     38.30 cm LA Vol (A2C):   63.7 ml  29.14 ml/m  RA Volume:   173.00 ml 79.14 ml/m LA Vol (A4C):   101.0 ml 46.20 ml/m LA Biplane Vol: 89.3 ml  40.85 ml/m  AORTIC VALVE LVOT Vmax:   95.00 cm/s LVOT Vmean:  67.000 cm/s LVOT VTI:    0.238 m  AORTA Ao Root diam: 3.50 cm Ao Asc diam:  4.00 cm MITRAL VALVE                TRICUSPID VALVE MV Area (PHT): 2.37 cm     TR Peak grad:   35.0 mmHg MV Decel Time: 320 msec     TR Vmax:        296.00 cm/s MV E velocity: 103.00 cm/s                             SHUNTS                             Systemic VTI:  0.24 m                             Systemic Diam: 2.20 cm Stanly Leavens Keller Electronically signed by Stanly Leavens Keller Signature Date/Time: 12/10/2023/4:40:03 PM    Final    DG Humerus Right Result Date: 12/09/2023 EXAM: 1 VIEW XRAY OF THE RIGHT HUMERUS 12/09/2023 08:14:00 AM COMPARISON: None available. CLINICAL HISTORY: Fall. Pt arrives via EMS for mechanical fall from home. FINDINGS: BONES AND JOINTS: No acute fracture. No focal osseous lesion. No joint dislocation. SOFT TISSUES: The soft tissues are unremarkable. IMPRESSION: 1. No evidence of acute traumatic injury. 2. Normal humerus radiographs. Electronically signed by: Waddell Calk Keller 12/09/2023 08:25 AM EDT RP Workstation: [id number]   CT CHEST ABDOMEN PELVIS W CONTRAST Result Date: 12/09/2023 EXAM: CT CHEST, ABDOMEN AND PELVIS WITH CONTRAST 12/09/2023 07:15:30 AM TECHNIQUE: CT of the chest, abdomen and pelvis was performed with the administration of intravenous contrast. Multiplanar reformatted images are provided for review. Automated exposure control, iterative reconstruction, and/or weight  based adjustment of the mA/kV was utilized to reduce the radiation dose to as low as reasonably achievable. COMPARISON: CT angio chest 04/05/2021. CLINICAL HISTORY: Polytrauma, blunt. FINDINGS:  CHEST: MEDIASTINUM AND LYMPH NODES: Status post median sternotomy and CABG procedure. Heart size normal. No pericardial effusion. LUNGS AND PLEURA: Mild changes of emphysema. No pleural fluid, interstitial edema or pneumothorax. Diffuse bronchial wall thickening identified. Calcified granuloma noted in the left lung base. There is persistent focal area of ground-glass attenuation in the central left upper lobe without solid component measuring 3.6 cm, image 87/9. On the previous exam this measured the same. ABDOMEN AND PELVIS: LIVER: Contour of the liver is slightly irregular which may reflect changes of early cirrhosis. GALLBLADDER AND BILE DUCTS: 8 mm gallstone. No gallbladder wall thickening or inflammation. SPLEEN: No acute abnormality. PANCREAS: No acute abnormality. ADRENAL GLANDS: No acute abnormality. KIDNEYS, URETERS AND BLADDER: No stones in the kidneys or ureters. No hydronephrosis. No perinephric or periureteral stranding. Urinary bladder is unremarkable. GI AND BOWEL: Mild stool burden noted within the colon and rectum. Status post right colectomy. There is no bowel obstruction. REPRODUCTIVE ORGANS: Prostate gland appears normal. PERITONEUM AND RETROPERITONEUM: No ascites. No free air. VASCULATURE: Aortic atherosclerotic calcification. ABDOMINAL AND PELVIS LYMPH NODES: No lymphadenopathy. BONES AND SOFT TISSUES: Lumbar spondylosis. No signs of acute fracture. IMPRESSION: 1. No evidence of acute traumatic injury within the chest, abdomen, or pelvis. . 2. Persistent ground glass attenuating nodule within the central left upper lobe without solid component, measuring 3.6 cm, unchanged from the previous exam. Follow-up imaging in 12 months with repeat CT of the chest is recommended. 3. Slightly irregular liver  contour, possibly reflecting early cirrhosis. 4. 8 mm gallstone. No gallbladder wall thickening or inflammation. 5. Status post right colectomy. Mild stool burden noted within the colon and rectum. 6. Aortic atherosclerotic calcification. 7. Mild emphysema. Electronically signed by: Waddell Calk Keller 12/09/2023 07:36 AM EDT RP Workstation: [id number]   CT Head Wo Contrast Result Date: 12/09/2023 EXAM: CT HEAD AND CERVICAL SPINE 12/09/2023 06:56:34 AM TECHNIQUE: CT of the head and cervical spine was performed without the administration of intravenous contrast. Multiplanar reformatted images are provided for review. Automated exposure control, iterative reconstruction, and/or weight based adjustment of the mA/kV was utilized to reduce the radiation dose to as low as reasonably achievable. COMPARISON: CT head 07/03/2023 and CT cervical spine 01/28/2013. CLINICAL HISTORY: Facial trauma, blunt. No contrast. Pt arrives via ems for mechanical fall from home. Unsure what he fell on but possible furniture. Complaints of head pain where laceration is. Pt reports almost LOC, saw stars but was conscious the whole time; Approx 4in hematoma to back of head, on eliquis . Has numbness on legs intermittent that is normal, denies neck pain. FINDINGS: CT HEAD BRAIN AND VENTRICLES: No acute intracranial hemorrhage. No mass effect or midline shift. No abnormal extra-axial fluid collection. No evidence of acute infarct. No hydrocephalus. Hypoattenuating foci in the cerebral white matter, most likely representing chronic small vessel disease. Prominence of the sulci and ventricles compatible with brain atrophy. ORBITS: No acute abnormality. SINUSES AND MASTOIDS: No acute abnormality. SOFT TISSUES AND SKULL: No acute skull fracture. Mild subcutaneous soft tissue stranding overlying the posterior scalp compatible with superficial contusion. CT CERVICAL SPINE BONES AND ALIGNMENT: No acute fracture. Retrolisthesis of C4 on C5 measures 3  mm. DEGENERATIVE CHANGES: Multilevel disc space narrowing and endplate spurring identified within the thoracic spine. This is most severe at C4-5 and C6-7 . SOFT TISSUES: No prevertebral soft tissue swelling. IMPRESSION: 1. No acute intracranial abnormality. 2. Chronic small vessel ischemic disease. 3. Cerebral atrophy. 4. Mild subcutaneous soft tissue stranding overlying the posterior scalp compatible with superficial  contusion. 5. No acute fracture or traumatic malalignment of the cervical spine. 6. Retrolisthesis of C4 on C5 (3 mm). 7. Multilevel cervical degenerative disc disease. Electronically signed by: Waddell Calk Keller 12/09/2023 07:13 AM EDT RP Workstation: [id number]   CT Cervical Spine Wo Contrast Result Date: 12/09/2023 EXAM: CT HEAD AND CERVICAL SPINE 12/09/2023 06:56:34 AM TECHNIQUE: CT of the head and cervical spine was performed without the administration of intravenous contrast. Multiplanar reformatted images are provided for review. Automated exposure control, iterative reconstruction, and/or weight based adjustment of the mA/kV was utilized to reduce the radiation dose to as low as reasonably achievable. COMPARISON: CT head 07/03/2023 and CT cervical spine 01/28/2013. CLINICAL HISTORY: Facial trauma, blunt. No contrast. Pt arrives via ems for mechanical fall from home. Unsure what he fell on but possible furniture. Complaints of head pain where laceration is. Pt reports almost LOC, saw stars but was conscious the whole time; Approx 4in hematoma to back of head, on eliquis . Has numbness on legs intermittent that is normal, denies neck pain. FINDINGS: CT HEAD BRAIN AND VENTRICLES: No acute intracranial hemorrhage. No mass effect or midline shift. No abnormal extra-axial fluid collection. No evidence of acute infarct. No hydrocephalus. Hypoattenuating foci in the cerebral white matter, most likely representing chronic small vessel disease. Prominence of the sulci and ventricles compatible with  brain atrophy. ORBITS: No acute abnormality. SINUSES AND MASTOIDS: No acute abnormality. SOFT TISSUES AND SKULL: No acute skull fracture. Mild subcutaneous soft tissue stranding overlying the posterior scalp compatible with superficial contusion. CT CERVICAL SPINE BONES AND ALIGNMENT: No acute fracture. Retrolisthesis of C4 on C5 measures 3 mm. DEGENERATIVE CHANGES: Multilevel disc space narrowing and endplate spurring identified within the thoracic spine. This is most severe at C4-5 and C6-7 . SOFT TISSUES: No prevertebral soft tissue swelling. IMPRESSION: 1. No acute intracranial abnormality. 2. Chronic small vessel ischemic disease. 3. Cerebral atrophy. 4. Mild subcutaneous soft tissue stranding overlying the posterior scalp compatible with superficial contusion. 5. No acute fracture or traumatic malalignment of the cervical spine. 6. Retrolisthesis of C4 on C5 (3 mm). 7. Multilevel cervical degenerative disc disease. Electronically signed by: Waddell Calk Keller 12/09/2023 07:13 AM EDT RP Workstation: [id number]   DG Chest Portable 1 View Result Date: 12/09/2023 CLINICAL DATA:  Fall injury with blunt polytrauma. EXAM: PORTABLE CHEST 1 VIEW RIGHT SHOULDER 3 VIEWS LEFT ELBOW 2 VIEWS PORTABLE PELVIS 1 VIEW COMPARISON:  Chest AP portable 08/26/2022, right shoulder series 05/07/2010, CT abdomen pelvis 05/26/2009. FINDINGS: Chest AP portable 6:38 a.m.: There is a tangle of overlying telemetry wiring. Intact midline sternotomy sutures with old CABG. Stable mild cardiomegaly. There is a left atrial appendage clip. No vascular congestion is seen. The mediastinum is stable. There is calcification in the aortic arch. The lungs are mildly emphysematous but clear. Osteopenia with no acute osseous findings. Right shoulder three views: Mild osteopenia. No evidence of fracture or dislocation. There is narrowing at the [facility] joint, moderate inferior AC joint osteophytes. The glenohumeral joint is unremarkable. No displaced fracture  of the right upper ribcage. Soft tissues are unremarkable. AP and lateral left elbow: Normal bone mineralization. No evidence of fracture, dislocation or joint effusion. Narrowing and spurring is noted on the medial trochleoulnar joint. Arthritic changes are not seen at the radiocapitellar joint. There is no other focal bone abnormality. Soft tissues are unremarkable. Portable pelvis: No evidence of pelvic fracture or diastasis. There is mild nonerosive arthrosis at the SI joints and hips. No other focal bone abnormality. There are  marginal osteophytes in the lower lumbar spine. There are bilateral common iliac arterial stents with heavy iliofemoral calcific arteriosclerosis. Postsurgical changes prior ileocecectomy. IMPRESSION: 1. No evidence of acute chest disease. 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 2. No evidence of right shoulder fracture or dislocation. AC joint DJD. 3. No evidence of left elbow fracture or dislocation. 4. No evidence of pelvic fracture or diastasis. 5. Aortic and iliofemoral atherosclerosis. 6. Bilateral common iliac arterial stents. Electronically Signed   By: Francis Quam M.D.   On: 12/09/2023 07:08   DG Pelvis Portable Result Date: 12/09/2023 CLINICAL DATA:  Fall injury with blunt polytrauma. EXAM: PORTABLE CHEST 1 VIEW RIGHT SHOULDER 3 VIEWS LEFT ELBOW 2 VIEWS PORTABLE PELVIS 1 VIEW COMPARISON:  Chest AP portable 08/26/2022, right shoulder series 05/07/2010, CT abdomen pelvis 05/26/2009. FINDINGS: Chest AP portable 6:38 a.m.: There is a tangle of overlying telemetry wiring. Intact midline sternotomy sutures with old CABG. Stable mild cardiomegaly. There is a left atrial appendage clip. No vascular congestion is seen. The mediastinum is stable. There is calcification in the aortic arch. The lungs are mildly emphysematous but clear. Osteopenia with no acute osseous findings. Right shoulder three views: Mild osteopenia. No evidence of fracture or dislocation. There is  narrowing at the [facility] joint, moderate inferior AC joint osteophytes. The glenohumeral joint is unremarkable. No displaced fracture of the right upper ribcage. Soft tissues are unremarkable. AP and lateral left elbow: Normal bone mineralization. No evidence of fracture, dislocation or joint effusion. Narrowing and spurring is noted on the medial trochleoulnar joint. Arthritic changes are not seen at the radiocapitellar joint. There is no other focal bone abnormality. Soft tissues are unremarkable. Portable pelvis: No evidence of pelvic fracture or diastasis. There is mild nonerosive arthrosis at the SI joints and hips. No other focal bone abnormality. There are marginal osteophytes in the lower lumbar spine. There are bilateral common iliac arterial stents with heavy iliofemoral calcific arteriosclerosis. Postsurgical changes prior ileocecectomy. IMPRESSION: 1. No evidence of acute chest disease. 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 2. No evidence of right shoulder fracture or dislocation. AC joint DJD. 3. No evidence of left elbow fracture or dislocation. 4. No evidence of pelvic fracture or diastasis. 5. Aortic and iliofemoral atherosclerosis. 6. Bilateral common iliac arterial stents. Electronically Signed   By: Francis Quam M.D.   On: 12/09/2023 07:08   DG Shoulder Right Result Date: 12/09/2023 CLINICAL DATA:  Fall injury with blunt polytrauma. EXAM: PORTABLE CHEST 1 VIEW RIGHT SHOULDER 3 VIEWS LEFT ELBOW 2 VIEWS PORTABLE PELVIS 1 VIEW COMPARISON:  Chest AP portable 08/26/2022, right shoulder series 05/07/2010, CT abdomen pelvis 05/26/2009. FINDINGS: Chest AP portable 6:38 a.m.: There is a tangle of overlying telemetry wiring. Intact midline sternotomy sutures with old CABG. Stable mild cardiomegaly. There is a left atrial appendage clip. No vascular congestion is seen. The mediastinum is stable. There is calcification in the aortic arch. The lungs are mildly emphysematous but clear. Osteopenia with  no acute osseous findings. Right shoulder three views: Mild osteopenia. No evidence of fracture or dislocation. There is narrowing at the [facility] joint, moderate inferior AC joint osteophytes. The glenohumeral joint is unremarkable. No displaced fracture of the right upper ribcage. Soft tissues are unremarkable. AP and lateral left elbow: Normal bone mineralization. No evidence of fracture, dislocation or joint effusion. Narrowing and spurring is noted on the medial trochleoulnar joint. Arthritic changes are not seen at the radiocapitellar joint. There is no other focal bone abnormality. Soft tissues are  unremarkable. Portable pelvis: No evidence of pelvic fracture or diastasis. There is mild nonerosive arthrosis at the SI joints and hips. No other focal bone abnormality. There are marginal osteophytes in the lower lumbar spine. There are bilateral common iliac arterial stents with heavy iliofemoral calcific arteriosclerosis. Postsurgical changes prior ileocecectomy. IMPRESSION: 1. No evidence of acute chest disease. 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 2. No evidence of right shoulder fracture or dislocation. AC joint DJD. 3. No evidence of left elbow fracture or dislocation. 4. No evidence of pelvic fracture or diastasis. 5. Aortic and iliofemoral atherosclerosis. 6. Bilateral common iliac arterial stents. Electronically Signed   By: Francis Quam M.D.   On: 12/09/2023 07:08   DG Elbow 2 Views Left Result Date: 12/09/2023 CLINICAL DATA:  Fall injury with blunt polytrauma. EXAM: PORTABLE CHEST 1 VIEW RIGHT SHOULDER 3 VIEWS LEFT ELBOW 2 VIEWS PORTABLE PELVIS 1 VIEW COMPARISON:  Chest AP portable 08/26/2022, right shoulder series 05/07/2010, CT abdomen pelvis 05/26/2009. FINDINGS: Chest AP portable 6:38 a.m.: There is a tangle of overlying telemetry wiring. Intact midline sternotomy sutures with old CABG. Stable mild cardiomegaly. There is a left atrial appendage clip. No vascular congestion is seen. The  mediastinum is stable. There is calcification in the aortic arch. The lungs are mildly emphysematous but clear. Osteopenia with no acute osseous findings. Right shoulder three views: Mild osteopenia. No evidence of fracture or dislocation. There is narrowing at the [facility] joint, moderate inferior AC joint osteophytes. The glenohumeral joint is unremarkable. No displaced fracture of the right upper ribcage. Soft tissues are unremarkable. AP and lateral left elbow: Normal bone mineralization. No evidence of fracture, dislocation or joint effusion. Narrowing and spurring is noted on the medial trochleoulnar joint. Arthritic changes are not seen at the radiocapitellar joint. There is no other focal bone abnormality. Soft tissues are unremarkable. Portable pelvis: No evidence of pelvic fracture or diastasis. There is mild nonerosive arthrosis at the SI joints and hips. No other focal bone abnormality. There are marginal osteophytes in the lower lumbar spine. There are bilateral common iliac arterial stents with heavy iliofemoral calcific arteriosclerosis. Postsurgical changes prior ileocecectomy. IMPRESSION: 1. No evidence of acute chest disease. 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 2. No evidence of right shoulder fracture or dislocation. AC joint DJD. 3. No evidence of left elbow fracture or dislocation. 4. No evidence of pelvic fracture or diastasis. 5. Aortic and iliofemoral atherosclerosis. 6. Bilateral common iliac arterial stents. Electronically Signed   By: Francis Quam M.D.   On: 12/09/2023 07:08    Microbiology: Results for orders placed or performed during the hospital encounter of 12/09/23  Surgical pcr screen     Status: None   Collection Time: 12/09/23  7:41 PM   Specimen: Nasal Mucosa; Nasal Swab  Result Value Ref Range Status   MRSA, PCR NEGATIVE NEGATIVE Final   Staphylococcus aureus NEGATIVE NEGATIVE Final    Comment: (NOTE) The Xpert SA Assay (FDA approved for NASAL specimens in  patients 74 years of age and older), is one component of a comprehensive surveillance program. It is not intended to diagnose infection nor to guide or monitor treatment. Performed at [facility], [address], [place], [place] [address]     Labs: CBC: Recent Labs  Lab 12/09/23 [phone] 12/09/23 0701 12/10/23 0251  WBC 7.6  --  9.4  HGB 14.3 13.6 13.1  HCT 42.7 40.0 39.4  MCV 96.6  --  97.0  PLT 175  --  163   Basic Metabolic Panel: Recent Labs  Lab 12/09/23 0653 12/09/23 0701 12/09/23 1557 12/10/23 0251  NA 136 137  --  136  K 3.9 3.9  --  3.8  CL 100 100  --  98  CO2 24  --   --  27  GLUCOSE 116* 115*  --  100*  BUN 31* 32*  --  23  CREATININE 1.29* 1.30*  --  1.00  CALCIUM  9.5  --   --  8.9  MG  --   --  2.3  --    Liver Function Tests: Recent Labs  Lab 12/09/23 0653 12/10/23 0251  AST 36 27  ALT 35 29  ALKPHOS 71 41  BILITOT 1.1 1.9*  PROT 6.6 5.9*  ALBUMIN  4.1 3.7   CBG: No results for input(s): GLUCAP in the last 168 hours.  Discharge time spent:42 minutes.  Signed: Deliliah Room, [facility] 12/11/2023

## 2023-12-11 NOTE — TOC Transition Note (Signed)
 Transition of Care [facility]) - Discharge Note   Patient Details  Name: [name] MRN: [id number] Date of Birth: June 25, 1940  Transition of Care [facility]) CM/SW Contact:  Roxie KANDICE Stain, RN Phone Number: 12/11/2023, 11:52 AM   Clinical Narrative:  [name] is stable to discharge home. Follow up apt on AVS. No ICM (Inpatient Care Management) needs at this time.     Final next level of care: Home/Self Care Barriers to Discharge: Barriers Resolved   Patient Goals and CMS Choice Patient states their goals for this hospitalization and ongoing recovery are:: return  home          Discharge Placement                 Home      Discharge Plan and Services Additional resources added to the After Visit Summary for                                       Social Drivers of Health (SDOH) Interventions SDOH Screenings   Food Insecurity: No Food Insecurity (12/09/2023)  Housing: Low Risk  (12/09/2023)  Transportation Needs: No Transportation Needs (12/09/2023)  Utilities: Not At Risk (12/09/2023)  Depression (PHQ2-9): Low Risk  (04/14/2018)  Financial Resource Strain: Low Risk  (04/08/2018)  Physical Activity: Unknown (04/08/2018)  Social Connections: Socially Integrated (12/09/2023)  Stress: Stress Concern Present (04/08/2018)  Tobacco Use: Medium Risk (12/09/2023)     Readmission Risk Interventions    12/11/2023   11:52 AM  Readmission Risk Prevention Plan  Post Dischage Appt Complete  Medication Screening Complete  Transportation Screening Complete

## 2023-12-11 NOTE — Progress Notes (Signed)
 OT Cancellation Note  Patient Details Name: [name] MRN: [id number] DOB: 08-06-1940   Cancelled Treatment:    Reason Eval/Treat Not Completed: Other (comment) (Pt reported that they do not need any OT at this time. Please reconsult if needed. Thank you.) Warrick POUR OTR/L  Acute Rehab Services  [phone] office number   Warrick Berber 12/11/2023, 10:18 AM

## 2023-12-13 ENCOUNTER — Telehealth: Payer: Self-pay | Admitting: Cardiovascular Disease

## 2023-12-13 NOTE — Telephone Encounter (Signed)
 Spoke with pt who is reporting increased wt from 194.8 lbs on 12/09/23 to 202.3 lbs this AM.  He is complaining of shortness of breath, abdominal distension and edema in his feet and ankles.  Recently in hospital for syncope and collapse/bradycardia.  During the hospitalization his Furosemide  was decreased from 40 mg BID to once daily and Metolazone  2.5 mg was discontinued.  He was previously taking it 3 times a week.  Pt is asking if his medication needs to be adjusted by would like for Dr Court to instructed him on how it needs to be adjusted.  Advised I will forward to him for review and he will be contacted with further instructions.  Of note - pt states he does not wear compression stockings and does not elevate feet during the day because it makes venous insuffiencey worse according to the article he read.  Advised pt - this is incorrect.  He would like for Dr Court to determine of if should be wearing compression stockings.

## 2023-12-13 NOTE — Telephone Encounter (Signed)
 [name] Dorn PARAS, MD to Me      12/13/23  3:15 PM Go back to his original furosemide  and zaroxolyn  dose and f/u with an APP in 2 weeks  Spoke with pt who states understanding to restart medications as ordered above.  Reviewed the importance of the use of knee high compression stockings and the elevation of feet above the level of his heart throughout the day.  Information given regarding [facility] in [place].  He reports he will obtain stockings of the local pharmacy for this weekend but will purchase more need week.  He is aware to follow up as scheduled.  He will call back if further concerns prior to his f/u appt.

## 2023-12-13 NOTE — Telephone Encounter (Signed)
 Pt c/o swelling/edema: STAT if pt has developed SOB within 24 hours  If swelling, where is the swelling located? Ankles and feet  How much weight have you gained and in what time span? 4.5 pounds since Wednesday  Have you gained 2 pounds in a day or 5 pounds in a week? Yes  Do you have a log of your daily weights (if so, list)? 194.8 before Wednesday 202.3 Today  Are you currently taking a fluid pill? Yes   Are you currently SOB? Yes   Have you traveled recently in a car or plane for an extended period of time? Pts stomach is distended and he can feel the weight of the fluid in his legs. Very heavy sensation. Please advise.

## 2023-12-19 ENCOUNTER — Telehealth: Payer: Self-pay | Admitting: Cardiovascular Disease

## 2023-12-19 NOTE — Telephone Encounter (Signed)
 Pt c/o swelling/edema: STAT if pt has developed SOB within 24 hours  If swelling, where is the swelling located?   Ankles  How much weight have you gained and in what time span?  Yes  Have you gained 2 pounds in a day or 5 pounds in a week?  2 pounds since yesterday  Do you have a log of your daily weights (if so, list)?  No  Are you currently taking a fluid pill?   Yes  Are you currently SOB?   Yes  Have you traveled recently in a car or plane for an extended period of time?   Patient stated he was recently hospitalized and now does not feel right.  Patient stated his BP and HR has been good.

## 2023-12-19 NOTE — Telephone Encounter (Signed)
 Spoke to patient and he reports that in the last 3 days he has gained 2 lbs. Pt admits that he is noticing leg weakness and ankle swelling. Pt is having slight shortness of breath. Last blood pressure was 134/58 with heart rate of 46 bpm. Pt states that he has been taking lasix  40 mg BID, potassium 20 mg BID, spironolactone  25 mg daily, and Metolazone  2.5 mg on Monday, Wednesday, and Fridays. ER precautions reviewed. Will send to provider for further review and recommendations.

## 2023-12-20 DIAGNOSIS — R55 Syncope and collapse: Secondary | ICD-10-CM | POA: Diagnosis not present

## 2023-12-20 DIAGNOSIS — R932 Abnormal findings on diagnostic imaging of liver and biliary tract: Secondary | ICD-10-CM | POA: Diagnosis not present

## 2023-12-20 DIAGNOSIS — I509 Heart failure, unspecified: Secondary | ICD-10-CM | POA: Diagnosis not present

## 2023-12-20 DIAGNOSIS — Z9181 History of falling: Secondary | ICD-10-CM | POA: Diagnosis not present

## 2023-12-20 DIAGNOSIS — R29898 Other symptoms and signs involving the musculoskeletal system: Secondary | ICD-10-CM | POA: Diagnosis not present

## 2023-12-20 DIAGNOSIS — Z23 Encounter for immunization: Secondary | ICD-10-CM | POA: Diagnosis not present

## 2023-12-20 DIAGNOSIS — I739 Peripheral vascular disease, unspecified: Secondary | ICD-10-CM | POA: Diagnosis not present

## 2023-12-20 DIAGNOSIS — D6869 Other thrombophilia: Secondary | ICD-10-CM | POA: Diagnosis not present

## 2023-12-20 NOTE — Telephone Encounter (Signed)
 Court Dorn PARAS, MD to [facility] Triage  (Selected Message)     12/20/23  3:19 PM Needs to be seen by an APP this next week   JJB  Left message on voicemail with the information above. Asked to call back to make an appt; we do have availability on Monday 10/6. Left call back number

## 2023-12-23 ENCOUNTER — Other Ambulatory Visit: Payer: Self-pay | Admitting: Cardiovascular Disease

## 2023-12-23 ENCOUNTER — Ambulatory Visit: Attending: Cardiovascular Disease

## 2023-12-23 ENCOUNTER — Encounter: Payer: Self-pay | Admitting: Cardiovascular Disease

## 2023-12-23 ENCOUNTER — Other Ambulatory Visit: Payer: Self-pay

## 2023-12-23 ENCOUNTER — Ambulatory Visit: Attending: Cardiovascular Disease | Admitting: Cardiovascular Disease

## 2023-12-23 VITALS — BP 132/64 | HR 48 | Ht 75.0 in | Wt 198.6 lb

## 2023-12-23 DIAGNOSIS — R55 Syncope and collapse: Secondary | ICD-10-CM | POA: Diagnosis not present

## 2023-12-23 DIAGNOSIS — I4891 Unspecified atrial fibrillation: Secondary | ICD-10-CM

## 2023-12-23 DIAGNOSIS — I4821 Permanent atrial fibrillation: Secondary | ICD-10-CM

## 2023-12-23 DIAGNOSIS — I5032 Chronic diastolic (congestive) heart failure: Secondary | ICD-10-CM | POA: Diagnosis not present

## 2023-12-23 DIAGNOSIS — I272 Pulmonary hypertension, unspecified: Secondary | ICD-10-CM | POA: Diagnosis not present

## 2023-12-23 DIAGNOSIS — Z951 Presence of aortocoronary bypass graft: Secondary | ICD-10-CM

## 2023-12-23 DIAGNOSIS — I739 Peripheral vascular disease, unspecified: Secondary | ICD-10-CM

## 2023-12-23 DIAGNOSIS — I10 Essential (primary) hypertension: Secondary | ICD-10-CM | POA: Diagnosis not present

## 2023-12-23 DIAGNOSIS — I6523 Occlusion and stenosis of bilateral carotid arteries: Secondary | ICD-10-CM | POA: Diagnosis not present

## 2023-12-23 NOTE — Assessment & Plan Note (Signed)
 History of essential hypertension with blood pressure measured today at 132/64.  He is on spironolactone .

## 2023-12-23 NOTE — Assessment & Plan Note (Signed)
 He is on Aldactone  and furosemide  with echo that showed intermediate diastolic parameters 12/10/2023 although he has had significant lower extremity edema improved with diuresis.

## 2023-12-23 NOTE — Assessment & Plan Note (Signed)
 History of PAD status post bilateral iliac stenting by myself 12/21/2021 with VBX covered stents.  He did have a 50% distal abdominal aortic stenosis and high-grade tandem SFA disease bilaterally.  His claudication resolved after that and his Dopplers improved.  He was walking for about [phone] steps a day working at the gym 5 days a week.  Recently has noticed that his legs have been somewhat weaker although lower extremity arterial Doppler studies performed/2/25 revealed noncompressible ABIs with moderately elevated velocities in both iliac arteries not significantly different than a year ago.  If he continues to have weakness in his legs he may require a CTA to further evaluate.

## 2023-12-23 NOTE — Progress Notes (Signed)
 12/23/2023 [name]   [birth year]  [id number]  Primary Physician Dwight Trula SQUIBB, MD Primary Cardiologist: Dorn JINNY Lesches MD FACP, [place], [place], [place]  HPI:  [name] is a 83 y.o.  mildly overweight, married Caucasian male father of 2, grandfather of 3 grandchildren who I saw  in the office 07/08/2023.[name]  His wife unfortunately has developed some dementia which is required him to perform additional tasks at home.  He has a history of CAD status post RCA stenting by myself January 11, 1998. He had normal circumflex, LAD and ejection fraction. His other problems include hypertension and chronic atrial fibrillation on Coumadin  anticoagulation, rate controlled, as well as erectile dysfunction. He is totally asymptomatic. He did have colon cancer picked up on screening colonoscopy and underwent right colectomy May 29, 2006 followed by 11 cycles of adjuvant Folfox chemotherapy. [name] He is very active exercises 6 days a week doing weight training 3 days a week, and lower extremity training 3 times a week.[name]   He was admitted to the hospital on 01/21/2018 with non-STEMI.  He underwent cardiac catheterization 2 days later by Dr. Mady revealing left main disease but disease in his LAD and RCA as well.  He underwent CABG by Dr. Lucas on 01/28/2018 with a LIMA to his LAD, vein to an obtuse marginal branch and to the PDA and PLA sequentially.  He also had left atrial clipping at that time.    He ended up completing cardiac rehab as an outpatient.   He saw Reche Finder, NP in the office 12/14/2020 who placed him on a low-dose diuretic and obtain a 2D echocardiogram which was performed 12/20/2020 revealing severe TR with pulmonary hypertension which is new for him.  He is fairly active but does complain of dyspnea on exertion which is a new symptom.     His symptoms of dyspnea significantly improved with the addition of a diuretic.  I did go chest CTA that showed no evidence of thromboembolic disease.   In addition, I did lower extremity arterial Doppler studies that show significant iliac disease bilaterally.  He does complain of lifestyle limiting claudication.  He had a right heart cath performed by Dr. Cherrie 06/14/2021 that showed mild pulmonary hypertension although he had V waves in both the right atrium and the wedge positions suggesting MR and TR.  He had a sleep study that showed severe obstructive sleep apnea.  He does have an appointment with Dr. Bensimhon at the end of May to discuss the right heart findings.  He may need a cardiac MRI and a TEE to further evaluate.  A chest CTA was negative for pulmonary thromboembolic disease as well as a VQ scan.  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   He has lost significant mount of weight as a result of the needed metolazone  which I placed him on.  His peripheral edema has completely resolved.  His dyspnea has resolved as well and he feels clinically improved.  He currently denies chest pain or shortness of breath.  His major complaint is lifestyle-limiting claudication with Doppler studies performed 04/02/2021 that revealed bilateral iliac disease .   I performed peripheral angiography on him 12/21/2021 revealing significant bilateral iliac disease which I stented with a 7 mm x 29 mm long VBX covered stents.  He did have tandem lesions in both SFAs.  He was discharged home the following day.  His claudication is markedly improved.  Postprocedural Doppler studies performed 01/05/2022 revealed the stents to be widely patent.   He was admitted to the hospital 6/24 with near syncope and thought to be related to dehydration.  He had a monitor which was placed that revealed A-fib with 100% burden, average heart rate in the 40s with some short runs of nonsustained ventricular tachycardia.  He is rejoined the gym and is  going back to exercising.  Since I stented his iliac arteries 12/21/2021 he denies claudication and his most recent Doppler studies 06/19/2023 suggested that his stents have remained patent.   He was working out in the gym 5 days a week right stationary bike, walking the treadmill and lifting weights.  He walks [phone] steps a day.   He does complain of some imbalance and is seeing a physical therapist for this.  He continues to deny claudication.  Since I saw him 6 months ago he was admitted to the hospital with syncope and was seen by Dr. Mona 12/07/2023.  Does have chronic A-fib with slow ventricular response.  His orthostatic vital signs were negative.  He was diuresed because of volume overload.  His 2D echo revealed normal LV systolic function with mild to moderate pulmonary hypertension and no significant valvular abnormalities.  He does complain of some weakness in his lower extremities and some unsteadiness on his feet and his feet.   Current Meds  Medication Sig   Alpha-Lipoic Acid 600 MG TABS Take 1,200 mg by mouth daily.   apixaban  (ELIQUIS ) 5 MG TABS tablet Take 5 mg by mouth 2 (two) times daily.   atorvastatin  (LIPITOR ) 80 MG tablet TAKE ONE TABLET AT SIX IN THE EVENING.   cholecalciferol  (VITAMIN D ) 1000 UNITS tablet Take 1,000 Units by mouth daily.   cilostazol  (PLETAL ) 50 MG tablet Take 1 tablet (50 mg total) by mouth 2 (two) times daily.   Coenzyme Q10 200 MG capsule Take 200 mg by mouth daily.    Cyanocobalamin  (VITAMIN B 12 PO) Take 200 mcg by mouth daily.   dapagliflozin  propanediol (FARXIGA ) 10 MG TABS tablet Take 1 tablet (10 mg total) by mouth daily.   Flaxseed, Linseed, 1000 MG CAPS Take 1,000 mg by mouth daily.   furosemide  (LASIX ) 40 MG tablet Take 1 tablet (40 mg total) by mouth daily.   Magnesium  Citrate 200 MG TABS Take 400 mg by mouth daily in the afternoon.   Polyethyl Glycol-Propyl Glycol (SYSTANE) 0.4-0.3 % GEL ophthalmic gel Place 1 application. into both eyes every  8 (eight) hours as needed (dry eyes).   potassium chloride  SA (KLOR-CON  M) 20 MEQ tablet Take 1 tablet (20 mEq total) by mouth 2 (two) times daily.   pyridoxine (B-6) 200 MG tablet Take 200 mg by mouth daily.   spironolactone  (ALDACTONE ) 25 MG tablet Take 1 tablet (25 mg total) by mouth daily.   vitamin C (ASCORBIC ACID) 500 MG tablet Take 500 mg by mouth 3 (three) times daily.    Zinc 50 MG TABS Take 1 tablet by mouth daily.     No Known Allergies  Social History   Socioeconomic History   Marital status: Married    Spouse name: [name]   Number of children: 2   Years of education: Not on file   Highest education level: Bachelor's degree (e.g., BA, AB, BS)  Occupational History   Occupation: retired    Associate Professor: RETIRED  Tobacco Use   Smoking  status: Former    Current packs/day: 0.00    Types: Cigarettes    Start date: 11/26/1963    Quit date: 11/25/1993    Years since quitting: 30.0   Smokeless tobacco: Never  Vaping Use   Vaping status: Never Used  Substance and Sexual Activity   Alcohol  use: Yes    Alcohol /week: 1.0 standard drink of alcohol     Types: 1 Cans of beer per week   Drug use: No   Sexual activity: Not on file  Other Topics Concern   Not on file  Social History Narrative   Not on file   Social Drivers of Health   Financial Resource Strain: Low Risk  (04/08/2018)   Overall Financial Resource Strain (CARDIA)    Difficulty of Paying Living Expenses: Not hard at all  Food Insecurity: No Food Insecurity (12/09/2023)   Hunger Vital Sign    Worried About Running Out of Food in the Last Year: Never true    Ran Out of Food in the Last Year: Never true  Transportation Needs: No Transportation Needs (12/09/2023)   PRAPARE - Administrator, Civil Service (Medical): No    Lack of Transportation (Non-Medical): No  Physical Activity: Unknown (04/08/2018)   Exercise Vital Sign    Days of Exercise per Week: 3 days    Minutes of Exercise per Session: Not  asked  Stress: Stress Concern Present (04/08/2018)   [employer] of Occupational Health - Occupational Stress Questionnaire    Feeling of Stress : To some extent  Social Connections: Socially Integrated (12/09/2023)   Social Connection and Isolation Panel    Frequency of Communication with Friends and Family: More than three times a week    Frequency of Social Gatherings with Friends and Family: More than three times a week    Attends Religious Services: More than 4 times per year    Active Member of [employer] or Organizations: Yes    Attends Engineer, structural: More than 4 times per year    Marital Status: Married  Catering manager Violence: Not At Risk (12/09/2023)   Humiliation, Afraid, Rape, and Kick questionnaire    Fear of Current or Ex-Partner: No    Emotionally Abused: No    Physically Abused: No    Sexually Abused: No     Review of Systems: General: negative for chills, fever, night sweats or weight changes.  Cardiovascular: negative for chest pain, dyspnea on exertion, edema, orthopnea, palpitations, paroxysmal nocturnal dyspnea or shortness of breath Dermatological: negative for rash Respiratory: negative for cough or wheezing Urologic: negative for hematuria Abdominal: negative for nausea, vomiting, diarrhea, bright red blood per rectum, melena, or hematemesis Neurologic: negative for visual changes, syncope, or dizziness All other systems reviewed and are otherwise negative except as noted above.    Blood pressure 132/64, pulse (!) 48, height 6' 3 (1.905 m), weight 198 lb 9.6 oz (90.1 kg), SpO2 96%.  General appearance: alert and no distress Neck: no adenopathy, no carotid bruit, no JVD, supple, symmetrical, trachea midline, and thyroid  not enlarged, symmetric, no tenderness/mass/nodules Lungs: clear to auscultation bilaterally Heart: irregularly irregular rhythm Extremities: extremities normal, atraumatic, no cyanosis or edema Pulses: Decreased pedal  pulses Skin: Skin color, texture, turgor normal. 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  He is on spironolactone .  Atrial fibrillation with slow ventricular response (HCC) History of A-fib now with a slow ventricular sponsor on Eliquis .  He was admitted with presyncope recently.  I am going get a 2-week Zio patch to further evaluate.  Carotid stenosis, asymptomatic History of moderate right ICA stenosis by duplex ultrasound 06/19/23.  This will be repeated on an annual basis.  S/P CABG x 4 History of CAD status post RCA stenting by myself 01/11/1998.  He had normal LAD and circumflex at that time.  He had a non-STEMI 01/21/2018 and underwent catheterization by Dr. Mady 2 days later revealing left main disease underwent CABG by Dr. Lucas 01/28/2018 with LIMA to his LAD, vein to an obtuse marginal branch, and PDA/PLA sequentially.  He also had left atrial clipping at that time.  He is fairly active and denies chest pain.  Pulmonary hypertension, unspecified (HCC) Moderate pulmonary hypertension with an RV systolic pressure of approximately 55 by 2D echo performed 12/10/2023 with normal LV systolic function and normal valvular function.  Peripheral arterial disease History of PAD status post bilateral iliac stenting by myself 12/21/2021 with VBX covered stents.  He did have a 50% distal abdominal aortic  stenosis and high-grade tandem SFA disease bilaterally.  His claudication resolved after that and his Dopplers improved.  He was walking for about 07-6998 steps a day working at the gym 5 days a week.  Recently has noticed that his legs have been somewhat weaker although lower extremity arterial Doppler studies performed/2/25 revealed noncompressible ABIs with moderately elevated velocities in both iliac arteries not significantly different than a year ago.  If he continues to have weakness in his legs he may require a CTA to further evaluate.  Syncope and collapse Patient was recently admitted for syncope workup and was seen by Dr. Mona in the hospital 12/11/2023.  He was somewhat bradycardic.  His diuretics were adjusted.  The etiology for his syncope was unclear.  His orthostatic vital signs were unremarkable.  He does now complain of some unsteadiness on his legs.  Chronic diastolic heart failure (HCC) He is on Aldactone  and furosemide  with echo that showed intermediate diastolic parameters 12/10/2023 although he has had significant lower extremity edema improved with diuresis.     Carrier DOROTHA Court MD FACP,FACC,FAHA, [facility] 12/23/2023 3:08 PM

## 2023-12-23 NOTE — Assessment & Plan Note (Signed)
 Moderate pulmonary hypertension with an RV systolic pressure of approximately 55 by 2D echo performed 12/10/2023 with normal LV systolic function and normal valvular function.

## 2023-12-23 NOTE — Assessment & Plan Note (Signed)
 History of A-fib now with a slow ventricular sponsor on Eliquis .  He was admitted with presyncope recently.  I am going get a 2-week Zio patch to further evaluate.

## 2023-12-23 NOTE — Progress Notes (Unsigned)
 Enrolled for [employer] to mail a ZIO XT long term holter monitor to the patients address on file.

## 2023-12-23 NOTE — Assessment & Plan Note (Signed)
 Patient was recently admitted for syncope workup and was seen by Dr. Mona in the hospital 12/11/2023.  He was somewhat bradycardic.  His diuretics were adjusted.  The etiology for his syncope was unclear.  His orthostatic vital signs were unremarkable.  He does now complain of some unsteadiness on his legs.

## 2023-12-23 NOTE — Patient Instructions (Signed)
 Medication Instructions:  Your physician recommends that you continue on your current medications as directed. Please refer to the Current Medication list given to you today.  *If you need a refill on your cardiac medications before your next appointment, please call your pharmacy*  Testing/Procedures: ZIO XT- Long Term Monitor Instructions  Your physician has requested you wear a ZIO patch monitor for 14 days.  This is a single patch monitor. [employer] supplies one patch monitor per enrollment. Additional stickers are not available. Please do not apply patch if you will be having a Nuclear Stress Test,  Echocardiogram, Cardiac CT, MRI, or Chest Xray during the period you would be wearing the  monitor. The patch cannot be worn during these tests. You cannot remove and re-apply the  ZIO XT patch monitor.  Your ZIO patch monitor will be mailed 3 day USPS to your address on file. It may take 3-5 days  to receive your monitor after you have been enrolled.  Once you have received your monitor, please review the enclosed instructions. Your monitor  has already been registered assigning a specific monitor serial # to you.  Billing and Patient Assistance Program Information  We have supplied [employer] with any of your insurance information on file for billing purposes. [employer] offers a sliding scale Patient Assistance Program for patients that do not have  insurance, or whose insurance does not completely cover the cost of the ZIO monitor.  You must apply for the Patient Assistance Program to qualify for this discounted rate.  To apply, please call [employer] at [phone], select option 4, select option 2, ask to apply for  Patient Assistance Program. Meredeth will ask your household income, and how many people  are in your household. They will quote your out-of-pocket cost based on that information.  [employer] will also be able to set up a 58-month, interest-free payment plan if needed.  Applying the  monitor   Shave hair from upper left chest.  Hold abrader disc by orange tab. Rub abrader in 40 strokes over the upper left chest as  indicated in your monitor instructions.  Clean area with 4 enclosed alcohol  pads. Let dry.  Apply patch as indicated in monitor instructions. Patch will be placed under collarbone on left  side of chest with arrow pointing upward.  Rub patch adhesive wings for 2 minutes. Remove white label marked 1. Remove the white  label marked 2. Rub patch adhesive wings for 2 additional minutes.  While looking in a mirror, press and release button in center of patch. A small green light will  flash 3-4 times. This will be your only indicator that the monitor has been turned on.  Do not shower for the first 24 hours. You may shower after the first 24 hours.  Press the button if you feel a symptom. You will hear a small click. Record Date, Time and  Symptom in the Patient Logbook.  When you are ready to remove the patch, follow instructions on the last 2 pages of Patient  Logbook. Stick patch monitor onto the last page of Patient Logbook.  Place Patient Logbook in the blue and white box. Use locking tab on box and tape box closed  securely. The blue and white box has prepaid postage on it. Please place it in the mailbox as  soon as possible. Your physician should have your test results approximately 7 days after the  monitor has been mailed back to [facility].  Call [facility] Customer Care at  [phone] if you have questions regarding  your ZIO XT patch monitor. Call them immediately if you see an orange light blinking on your  monitor.  If your monitor falls off in less than 4 days, contact our Monitor department at [phone].  If your monitor becomes loose or falls off after 4 days call [employer] at [phone] for  suggestions on securing your monitor   Follow-Up: At [facility], you and your health needs are our priority.  As part of  our continuing mission to provide you with exceptional heart care, our providers are all part of one team.  This team includes your primary Cardiologist (physician) and Advanced Practice Providers or APPs (Physician Assistants and Nurse Practitioners) who all work together to provide you with the care you need, when you need it.  Your next appointment:   3 month(s)  Provider:   Dorn Lesches, MD

## 2023-12-23 NOTE — Assessment & Plan Note (Signed)
 History of moderate right ICA stenosis by duplex ultrasound 06/19/23.  This will be repeated on an annual basis.

## 2023-12-23 NOTE — Telephone Encounter (Signed)
 Pt has an appt wit Dr. Court today.

## 2023-12-23 NOTE — Assessment & Plan Note (Signed)
 History of CAD status post RCA stenting by myself 01/11/1998.  He had normal LAD and circumflex at that time.  He had a non-STEMI 01/21/2018 and underwent catheterization by Dr. Mady 2 days later revealing left main disease underwent CABG by Dr. Lucas 01/28/2018 with LIMA to his LAD, vein to an obtuse marginal branch, and PDA/PLA sequentially.  He also had left atrial clipping at that time.  He is fairly active and denies chest pain.

## 2023-12-25 ENCOUNTER — Ambulatory Visit: Admitting: Cardiovascular Disease

## 2023-12-25 MED ORDER — POTASSIUM CHLORIDE CRYS ER 20 MEQ PO TBCR: 20.0000 meq | EXTENDED_RELEASE_TABLET | Freq: Two times a day (BID) | ORAL | 3 refills | Status: AC

## 2024-01-02 ENCOUNTER — Telehealth: Payer: Self-pay | Admitting: Cardiovascular Disease

## 2024-01-02 NOTE — Telephone Encounter (Signed)
 Pt c/o medication issue:  1. Name of Medication:  potassium chloride  SA (KLOR-CON  M) 20 MEQ tablet furosemide  (LASIX ) 40 MG tablet spironolactone  (ALDACTONE ) 25 MG tablet  2. How are you currently taking this medication (dosage and times per day)?   3. Are you having a reaction (difficulty breathing--STAT)?   4. What is your medication issue?   Patient says he has been taking these medications together. His legs are weak, he has had mild lightheadedness and BP is averaging 136/60. He read that he should never take them all together and he would like to know what he should do. Please advise.

## 2024-01-02 NOTE — Telephone Encounter (Signed)
 Spoke to patient he stated he changed the way he takes his medications.Stated this week he started taking Lasix ,Spironolactone ,Potassium all together in the morning.Stated he has been feeling more weakness in both legs and unsteady.Alittle light headed.B/P at present 136/60.Pulse 42.Stated he always has a low pulse. He looked up and AI said this could be like threatening.He is not having any chest pain and no sob. Advised I will send message to our Pharmacy team for advice.

## 2024-01-03 NOTE — Telephone Encounter (Signed)
 Last potassium level normal. Suspect symptoms could be related to his low pulse rate. Can move spironolactone  to later in the morning to see if this helps

## 2024-01-03 NOTE — Telephone Encounter (Signed)
 Spoke to patient PharmD's advice given.Advised to call back if symptoms don't improve.

## 2024-01-06 ENCOUNTER — Telehealth: Payer: Self-pay

## 2024-01-06 NOTE — Telephone Encounter (Signed)
 Received a call from patient.He stated he switched Spironolactone  to taking in afternoons.Stated he Continues to  have weakness in both legs.Stated he has appointment with Dr.Stoner 10/29.He will discuss with him since he prescribed.

## 2024-01-08 ENCOUNTER — Encounter (HOSPITAL_COMMUNITY): Admitting: Cardiology

## 2024-01-15 ENCOUNTER — Ambulatory Visit (HOSPITAL_COMMUNITY)
Admission: RE | Admit: 2024-01-15 | Discharge: 2024-01-15 | Disposition: A | Discharge status: Home / Self Care | Source: Ambulatory Visit | Attending: Cardiology | Admitting: Cardiology

## 2024-01-15 ENCOUNTER — Encounter (HOSPITAL_COMMUNITY): Payer: Self-pay | Admitting: Cardiology

## 2024-01-15 VITALS — BP 128/62 | HR 53 | Ht 75.0 in | Wt 198.0 lb

## 2024-01-15 DIAGNOSIS — J449 Chronic obstructive pulmonary disease, unspecified: Secondary | ICD-10-CM | POA: Insufficient documentation

## 2024-01-15 DIAGNOSIS — I4821 Permanent atrial fibrillation: Secondary | ICD-10-CM | POA: Diagnosis not present

## 2024-01-15 DIAGNOSIS — Z79899 Other long term (current) drug therapy: Secondary | ICD-10-CM | POA: Insufficient documentation

## 2024-01-15 DIAGNOSIS — I482 Chronic atrial fibrillation, unspecified: Secondary | ICD-10-CM | POA: Insufficient documentation

## 2024-01-15 DIAGNOSIS — I739 Peripheral vascular disease, unspecified: Secondary | ICD-10-CM | POA: Insufficient documentation

## 2024-01-15 DIAGNOSIS — I272 Pulmonary hypertension, unspecified: Secondary | ICD-10-CM | POA: Diagnosis not present

## 2024-01-15 DIAGNOSIS — I2510 Atherosclerotic heart disease of native coronary artery without angina pectoris: Secondary | ICD-10-CM | POA: Diagnosis not present

## 2024-01-15 DIAGNOSIS — I519 Heart disease, unspecified: Secondary | ICD-10-CM | POA: Diagnosis not present

## 2024-01-15 DIAGNOSIS — Z955 Presence of coronary angioplasty implant and graft: Secondary | ICD-10-CM | POA: Diagnosis not present

## 2024-01-15 DIAGNOSIS — Z9221 Personal history of antineoplastic chemotherapy: Secondary | ICD-10-CM | POA: Insufficient documentation

## 2024-01-15 DIAGNOSIS — Z951 Presence of aortocoronary bypass graft: Secondary | ICD-10-CM | POA: Diagnosis not present

## 2024-01-15 DIAGNOSIS — I5032 Chronic diastolic (congestive) heart failure: Secondary | ICD-10-CM | POA: Diagnosis present

## 2024-01-15 DIAGNOSIS — I110 Hypertensive heart disease with heart failure: Secondary | ICD-10-CM | POA: Diagnosis present

## 2024-01-15 NOTE — Patient Instructions (Signed)
 There has been no changes to your medications.  Your physician recommends that you schedule a follow-up appointment in: 6 months ( April 2026) **PLEASE CALL THE OFFICE IN FEBRUARY 2026 TO ARRANGE YOUR FOLLOW UP APPOINTMENT.**  If you have any questions or concerns before your next appointment please send us  a message through [facility] or call our office at [phone].    TO LEAVE A MESSAGE FOR THE NURSE SELECT OPTION 2, PLEASE LEAVE A MESSAGE INCLUDING: YOUR NAME DATE OF BIRTH CALL BACK NUMBER REASON FOR CALL**this is important as we prioritize the call backs  YOU WILL RECEIVE A CALL BACK THE SAME DAY AS LONG AS YOU CALL BEFORE 4:00 PM  At the [facility], you and your health needs are our priority. As part of our continuing mission to provide you with exceptional heart care, we have created designated Provider Care Teams. These Care Teams include your primary Cardiologist (physician) and Advanced Practice Providers (APPs- Physician Assistants and Nurse Practitioners) who all work together to provide you with the care you need, when you need it.   You may see any of the following providers on your designated Care Team at your next follow up: Dr Toribio Fuel Dr Ezra Shuck Dr. [name] Greig Mosses, NP Caffie Shed, [place] [facility] [place], [place] Beckey Coe, NP Jordan Lee, NP Ellouise Class, NP Tinnie Redman, PharmD Jaun Bash, PharmD   Please be sure to bring in all your medications bottles to every appointment.    Thank you for choosing [facility]

## 2024-01-19 NOTE — Progress Notes (Signed)
   ADVANCED HEART FAILURE FOLLOW UP CLINIC NOTE  Referring Physician: Dwight Trula SQUIBB, MD  Primary Care: Dwight Trula SQUIBB, MD Primary Cardiologist:  HPI: [name] is a 83 y.o. male with a PMH of with HTN, chronic AF, CAD s/p RCA stenting in 1999 and CABG 11/19, PAD, colonCA s/p right colectomy 3/08 followed by 11 cycles of adjuvant Folfox chemotherapy. who presents for follow up of chronic diastolic heart faliure.    SUBJECTIVE:  Patient was recently hospitalized in September for a fall while putting his shirt on, some concern for syncope.  He was found to be bradycardic with rates in the 40s due to his permanent atrial fibrillation.  Echo showed worsening RV dysfunction and PA pressures, otherwise stable.  He is able to complete all his activities of daily living, but feels limited and is concerned by this.  We discussed the natural history of both heart failure and pulmonary hypertension.  We discussed potential options including right heart catheterization for further evaluation, but noted that this would be unlikely to change significant long-term management.  He would like to hold off at this time.  PMH, current medications, allergies, social history, and family history reviewed in epic.  PHYSICAL EXAM: Vitals:   01/15/24 1530  BP: 128/62  Pulse: (!) 53  SpO2: 95%   GENERAL: NAD, fair appearing PULM:  Normal work of breathing, CTAB CARDIAC:  JVP: flat        Systolic murmur, irregular rate and rhythm.  trace edema. Warm and well perfused extremities. ABDOMEN: Soft, non-tender, non-distended. NEUROLOGIC: Patient is oriented x3 with no focal or lateralizing neurologic deficits.     DATA REVIEW  ECHO: Echo 10/22 LVEF >65% RV dilated with normal function. Severe central TR RVSP (previously 38)    CATH: RA = 12 (v- waves to 23) PA = 43/11 (26) PCW = 14 (v- waves up to 35) Fick cardiac output/index = 6.6/2.9 PVR = 1.8 WU PAPi = 2.7    PFTs 3/23: FEV1 1.54 (42%)  FVC 3.23 (64) FEV1/FVC 48% DLCO 46%   cMRI 11/23 1.  Severe RAE, moderate LAE 2.  Mild LVH normal LVEF 71% with no RWMAls 3. Normal post gadolinium images no evidence of amyloid or infiltrative disease 4.  Normal RV size and function RVEF 55%    ASSESSMENT & PLAN:  Chronic diastolic heart failure: Likely moderate restrictive disease versus poor atrial compliance given v waves on RHC. Suspect RV > LV dysfunction. Discussed mainstays of therapy including diuretics and briefly the level trial. Very functional and asks good questions, just wants to remain at his baseline.  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 On CPAP, remainder of workup negative.  - Management of HFpEF as above  CAD  - s/p CABG 2019  - No s/s angina - Continue ASA/statin - Managed by Dr. Court   COPD - Consider pulmonary f/u   PAD - s/p bilateral iliac stenting 10/23  Follow up prn  [name], MD Advanced Heart Failure Mechanical Circulatory Support 01/19/24

## 2024-01-22 DIAGNOSIS — I4891 Unspecified atrial fibrillation: Secondary | ICD-10-CM

## 2024-01-22 DIAGNOSIS — R55 Syncope and collapse: Secondary | ICD-10-CM

## 2024-01-22 DIAGNOSIS — I4821 Permanent atrial fibrillation: Secondary | ICD-10-CM

## 2024-01-23 ENCOUNTER — Ambulatory Visit: Payer: Self-pay | Admitting: Cardiovascular Disease

## 2024-01-28 ENCOUNTER — Telehealth: Payer: Self-pay | Admitting: Cardiovascular Disease

## 2024-01-28 DIAGNOSIS — I10 Essential (primary) hypertension: Secondary | ICD-10-CM

## 2024-01-28 MED ORDER — FUROSEMIDE 40 MG PO TABS: 40.0000 mg | ORAL_TABLET | Freq: Every day | ORAL | 3 refills | Status: DC

## 2024-01-28 NOTE — Telephone Encounter (Signed)
*  STAT* If patient is at the pharmacy, call can be transferred to refill team.   1. Which medications need to be refilled? (please list name of each medication and dose if known)   furosemide  (LASIX ) 40 MG tablet   2. Would you like to learn more about the convenience, safety, & potential cost savings by using the [facility]? No      3. Are you open to using the [facility] (Type [facility]. ). No    4. Which pharmacy/location (including street and city if local pharmacy) is medication to be sent to?  [facility] [place], [place] - [address]    5. Do they need a 30 day or 90 day supply? 90 days    Patient is out of meds and need refill today

## 2024-01-28 NOTE — Telephone Encounter (Signed)
 Refill sent for Lasix  40 mg daily, #90.

## 2024-02-06 ENCOUNTER — Ambulatory Visit: Admitting: Cardiovascular Disease

## 2024-02-17 ENCOUNTER — Ambulatory Visit: Attending: Cardiovascular Disease | Admitting: Cardiovascular Disease

## 2024-02-17 ENCOUNTER — Encounter: Payer: Self-pay | Admitting: Cardiovascular Disease

## 2024-02-17 VITALS — BP 148/52 | HR 54 | Ht 75.0 in | Wt 194.6 lb

## 2024-02-17 DIAGNOSIS — I4891 Unspecified atrial fibrillation: Secondary | ICD-10-CM | POA: Diagnosis not present

## 2024-02-17 DIAGNOSIS — I6522 Occlusion and stenosis of left carotid artery: Secondary | ICD-10-CM

## 2024-02-17 DIAGNOSIS — I739 Peripheral vascular disease, unspecified: Secondary | ICD-10-CM | POA: Diagnosis not present

## 2024-02-17 DIAGNOSIS — E785 Hyperlipidemia, unspecified: Secondary | ICD-10-CM | POA: Diagnosis not present

## 2024-02-17 NOTE — Assessment & Plan Note (Signed)
 History of moderate left ICA stenosis by duplex ultrasound performed/2/25.  This to be repeated on an annual basis.

## 2024-02-17 NOTE — Progress Notes (Signed)
 02/17/2024 [name]   1940/09/17  [id number]  Primary Physician [name] Trula SQUIBB, MD Primary Cardiologist: [name] JINNY Lesches MD FACP, [place], [place], [place]  HPI:  [name] is a 83 y.o.  mildly overweight, married Caucasian male father of 2, grandfather of 3 grandchildren who I saw  in the office 12/23/2023.[name]  His wife unfortunately has developed some dementia which is required him to perform additional tasks at home.  He has a history of CAD status post RCA stenting by myself January 11, 1998. He had normal circumflex, LAD and ejection fraction. His other problems include hypertension and chronic atrial fibrillation on Coumadin  anticoagulation, rate controlled, as well as erectile dysfunction. He is totally asymptomatic. He did have colon cancer picked up on screening colonoscopy and underwent right colectomy May 29, 2006 followed by 11 cycles of adjuvant Folfox chemotherapy. [name] He is very active exercises 6 days a week doing weight training 3 days a week, and lower extremity training 3 times a week.[name]   He was admitted to the hospital on 01/21/2018 with non-STEMI.  He underwent cardiac catheterization 2 days later by Dr. Mady revealing left main disease but disease in his LAD and RCA as well.  He underwent CABG by Dr. Lucas on 01/28/2018 with a LIMA to his LAD, vein to an obtuse marginal branch and to the PDA and PLA sequentially.  He also had left atrial clipping at that time.    He ended up completing cardiac rehab as an outpatient.   He saw Reche Finder, NP in the office 12/14/2020 who placed him on a low-dose diuretic and obtain a 2D echocardiogram which was performed 12/20/2020 revealing severe TR with pulmonary hypertension which is new for him.  He is fairly active but does complain of dyspnea on exertion which is a new symptom.     His symptoms of dyspnea significantly improved with the addition of a diuretic.  I did go chest CTA that showed no evidence of thromboembolic disease.   In addition, I did lower extremity arterial Doppler studies that show significant iliac disease bilaterally.  He does complain of lifestyle limiting claudication.  He had a right heart cath performed by Dr. Cherrie 06/14/2021 that showed mild pulmonary hypertension although he had V waves in both the right atrium and the wedge positions suggesting MR and TR.  He had a sleep study that showed severe obstructive sleep apnea.  He does have an appointment with Dr. Bensimhon at the end of May to discuss the right heart findings.  He may need a cardiac MRI and a TEE to further evaluate.  A chest CTA was negative for pulmonary thromboembolic disease as well as a VQ scan.  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   He has lost significant mount of weight as a result of the needed metolazone  which I placed him on.  His peripheral edema has completely resolved.  His dyspnea has resolved as well and he feels clinically improved.  He currently denies chest pain or shortness of breath.  His major complaint is lifestyle-limiting claudication with Doppler studies performed 04/02/2021 that revealed bilateral iliac disease .   I performed peripheral angiography on him 12/21/2021 revealing significant bilateral iliac disease which I stented with a 7 mm x 29 mm long VBX covered stents.  He did have tandem lesions in both SFAs.  He was discharged home the following day.  His claudication is markedly improved.  Postprocedural Doppler studies performed 01/05/2022 revealed the stents to be widely patent.   He was admitted to the hospital 6/24 with near syncope and thought to be related to dehydration.  He had a monitor which was placed that revealed A-fib with 100% burden, average heart rate in the 40s with some short runs of nonsustained ventricular tachycardia.  He is rejoined the gym and is  going back to exercising.  Since I stented his iliac arteries 12/21/2021 he denies claudication and his most recent Doppler studies 06/19/2023 suggested that his stents have remained patent.   He was working out in the gym 5 days a week right stationary bike, walking the treadmill and lifting weights.  He walks [phone] steps a day.   He does complain of some imbalance and is seeing a physical therapist for this.  He continues to deny claudication.   Since I saw him in the office 2 months ago he did have a monitor that he wore for 2 weeks that showed predominantly junctional bradycardia with short runs of SVT and NSVT although he is completely asymptomatic.  He does have a history of chronic A-fib on Eliquis  oral anticoagulation.   Current Meds  Medication Sig   apixaban  (ELIQUIS ) 5 MG TABS tablet Take 5 mg by mouth 2 (two) times daily.   atorvastatin  (LIPITOR ) 80 MG tablet TAKE ONE TABLET AT SIX IN THE EVENING.   cholecalciferol  (VITAMIN D ) 1000 UNITS tablet Take 1,000 Units by mouth daily.   Cyanocobalamin  (VITAMIN B 12 PO) Take 200 mcg by mouth daily.   dapagliflozin  propanediol (FARXIGA ) 10 MG TABS tablet Take 1 tablet (10 mg total) by mouth daily.   furosemide  (LASIX ) 40 MG tablet Take 1 tablet (40 mg total) by mouth daily.   Magnesium  Citrate 200 MG TABS Take 400 mg by mouth daily in the afternoon.   metolazone  (ZAROXOLYN ) 2.5 MG tablet Take 2.5 mg by mouth 3 (three) times a week.   Polyethyl Glycol-Propyl Glycol (SYSTANE) 0.4-0.3 % GEL ophthalmic gel Place 1 application. into both eyes every 8 (eight) hours as needed (dry eyes).   potassium chloride  SA (KLOR-CON  M) 20 MEQ tablet Take 1 tablet (20 mEq total) by mouth 2 (two) times daily.   pyridoxine (B-6) 200 MG tablet Take 200 mg by mouth daily.   spironolactone  (ALDACTONE ) 25 MG tablet Take 1 tablet (25 mg total) by mouth daily.   vitamin C (ASCORBIC ACID) 500 MG tablet Take 500 mg by mouth 3 (three) times daily.    Zinc 50 MG TABS Take 1  tablet by mouth daily.     No Known Allergies  Social History   Socioeconomic History   Marital status: Married    Spouse name: [name]   Number of children: 2   Years of education: Not on file   Highest education level: Bachelor's degree (e.g., BA, AB, BS)  Occupational History   Occupation: retired    Associate Professor: RETIRED  Tobacco Use   Smoking status: Former    Current packs/day: 0.00    Types: Cigarettes    Start date: 11/26/1963    Quit date: 11/25/1993    Years since quitting: 30.2   Smokeless tobacco: Never  Vaping Use   Vaping status: Never Used  Substance and Sexual Activity   Alcohol  use: Not Currently    Alcohol /week: 1.0 standard drink of alcohol     Types: 1 Cans of beer per week  Drug use: No   Sexual activity: Not on file  Other Topics Concern   Not on file  Social History Narrative   Not on file   Social Drivers of Health   Financial Resource Strain: Low Risk  (04/08/2018)   Overall Financial Resource Strain (CARDIA)    Difficulty of Paying Living Expenses: Not hard at all  Food Insecurity: No Food Insecurity (12/09/2023)   Hunger Vital Sign    Worried About Running Out of Food in the Last Year: Never true    Ran Out of Food in the Last Year: Never true  Transportation Needs: No Transportation Needs (12/09/2023)   PRAPARE - Administrator, Civil Service (Medical): No    Lack of Transportation (Non-Medical): No  Physical Activity: Unknown (04/08/2018)   Exercise Vital Sign    Days of Exercise per Week: 3 days    Minutes of Exercise per Session: Not asked  Stress: Stress Concern Present (04/08/2018)   [employer] of Occupational Health - Occupational Stress Questionnaire    Feeling of Stress : To some extent  Social Connections: Socially Integrated (12/09/2023)   Social Connection and Isolation Panel    Frequency of Communication with Friends and Family: More than three times a week    Frequency of Social Gatherings with Friends and  Family: More than three times a week    Attends Religious Services: More than 4 times per year    Active Member of [employer] or Organizations: Yes    Attends Engineer, Structural: More than 4 times per year    Marital Status: Married  Catering Manager Violence: Not At Risk (12/09/2023)   Humiliation, Afraid, Rape, and Kick questionnaire    Fear of Current or Ex-Partner: No    Emotionally Abused: No    Physically Abused: No    Sexually Abused: No     Review of Systems: General: negative for chills, fever, night sweats or weight changes.  Cardiovascular: negative for chest pain, dyspnea on exertion, edema, orthopnea, palpitations, paroxysmal nocturnal dyspnea or shortness of breath Dermatological: negative for rash Respiratory: negative for cough or wheezing Urologic: negative for hematuria Abdominal: negative for nausea, vomiting, diarrhea, bright red blood per rectum, melena, or hematemesis Neurologic: negative for visual changes, syncope, or dizziness All other systems reviewed and are otherwise negative except as noted above.    Blood pressure (!) 148/52, pulse (!) 54, height 6' 3 (1.905 m), weight 194 lb 9.6 oz (88.3 kg), SpO2 98%.  General appearance: alert and no distress Neck: no adenopathy, no carotid bruit, no JVD, supple, symmetrical, trachea midline, and thyroid  not enlarged, symmetric, no tenderness/mass/nodules Lungs: clear to auscultation bilaterally Heart: regular rate and rhythm, S1, S2 normal, no murmur, click, rub or gallop Extremities: extremities normal, atraumatic, no cyanosis or edema Pulses: Diminished pedal pulses Skin: Skin color, texture, turgor normal. No rashes or lesions Neurologic: Grossly normal  EKG not performed today      ASSESSMENT AND PLAN:   Atrial fibrillation with slow ventricular response (HCC) History of chronic A-fib with recent monitor that showed junctional rhythm with short runs of SVT and nonsustained VT.  His average heart  rate was 48.  He is completely asymptomatic.  Carotid stenosis, asymptomatic History of moderate left ICA stenosis by duplex ultrasound performed/2/25.  This to be repeated on an annual basis.  Dyslipidemia, goal LDL below 70 History of dyslipidemia on high-dose statin therapy with lipid profile performed 01/22/2024 revealing total cholesterol 119, LDL 47 and HDL  of 57.  Peripheral arterial disease History of PAD status post bilateral iliac stenting by myself 12/21/2021 with VBX covered stents.  He no longer has claudication.  His most recent lower extremity arterial Doppler studies revealed patent stents with moderately elevated velocities.  Given his lack of symptoms we will continue to follow him noninvasively.     [name] DOROTHA Lesches MD FACP,FACC,FAHA, [place] 02/17/2024 8:57 AM

## 2024-02-17 NOTE — Assessment & Plan Note (Signed)
 History of chronic A-fib with recent monitor that showed junctional rhythm with short runs of SVT and nonsustained VT.  His average heart rate was 48.  He is completely asymptomatic.

## 2024-02-17 NOTE — Patient Instructions (Signed)
 Medication Instructions:  Your physician recommends that you continue on your current medications as directed. Please refer to the Current Medication list given to you today.  *If you need a refill on your cardiac medications before your next appointment, please call your pharmacy*  Testing/Procedures: Your physician has requested that you have a carotid duplex. This test is an ultrasound of the carotid arteries in your neck. It looks at blood flow through these arteries that supply the brain with blood. Allow one hour for this exam. There are no restrictions or special instructions. This will take place at [address] 4th floor **To do in April**  Please note: We ask at that you not bring children with you during ultrasound (echo/ vascular) testing. Due to room size and safety concerns, children are not allowed in the ultrasound rooms during exams. Our front office staff cannot provide observation of children in our lobby area while testing is being conducted. An adult accompanying a patient to their appointment will only be allowed in the ultrasound room at the discretion of the ultrasound technician under special circumstances. We apologize for any inconvenience.   Your physician has requested that you have an Aorta/Iliac Duplex. This will take place at [address]th floor  No food after 11PM the night before.  Water is OK. (Don't drink liquids if you have been instructed not to for ANOTHER test) Avoid foods that produce bowel gas, for 24 hours prior to exam (see below). No breakfast, no chewing gum, no smoking or carbonated beverages. Patient may take morning medications with water. Come in for test at least 15 minutes early to register. **To do in April**  Please note: We ask at that you not bring children with you during ultrasound (echo/ vascular) testing. Due to room size and safety concerns, children are not allowed in the ultrasound rooms during exams. Our front office  staff cannot provide observation of children in our lobby area while testing is being conducted. An adult accompanying a patient to their appointment will only be allowed in the ultrasound room at the discretion of the ultrasound technician under special circumstances. We apologize for any inconvenience.   Your physician has requested that you have an ankle brachial index (ABI). During this test an ultrasound and blood pressure cuff are used to evaluate the arteries that supply the arms and legs with blood. Allow thirty minutes for this exam. There are no restrictions or special instructions. This will take place at [address] 4th floor  **To do in April**   Please note: We ask at that you not bring children with you during ultrasound (echo/ vascular) testing. Due to room size and safety concerns, children are not allowed in the ultrasound rooms during exams. Our front office staff cannot provide observation of children in our lobby area while testing is being conducted. An adult accompanying a patient to their appointment will only be allowed in the ultrasound room at the discretion of the ultrasound technician under special circumstances. We apologize for any inconvenience.    Follow-Up: At [facility], you and your health needs are our priority.  As part of our continuing mission to provide you with exceptional heart care, our providers are all part of one team.  This team includes your primary Cardiologist (physician) and Advanced Practice Providers or APPs (Physician Assistants and Nurse Practitioners) who all work together to provide you with the care you need, when you need it.  Your next appointment:  12 month(s)  Provider:   Dorn Lesches, MD

## 2024-02-17 NOTE — Assessment & Plan Note (Signed)
 History of dyslipidemia on high-dose statin therapy with lipid profile performed 01/22/2024 revealing total cholesterol 119, LDL 47 and HDL of 57.

## 2024-02-17 NOTE — Assessment & Plan Note (Signed)
 History of PAD status post bilateral iliac stenting by myself 12/21/2021 with VBX covered stents.  He no longer has claudication.  His most recent lower extremity arterial Doppler studies revealed patent stents with moderately elevated velocities.  Given his lack of symptoms we will continue to follow him noninvasively.

## 2024-03-09 ENCOUNTER — Telehealth: Payer: Self-pay | Admitting: Cardiovascular Disease

## 2024-03-09 NOTE — Telephone Encounter (Signed)
 Patient states Lasix  dosage was changed from 40 mg BID to once daily at hospital discharge in September.  Patient had continued taking Lasix  40 mg BID, until recently he noticed he had been reduced to once daily. He tried once daily Lasix  for 4 days and had swelling in feet/ankles and shortness of breath. He has gone back to taking Lasix  BID and is doing much better on this dose, symptoms are resolving.  He will need new Rx sent to [facility] as he will be out of Lasix  on Saturday.  Will send to Dr. Court to review and approve changing Rx to Lasix  40 mg BID.

## 2024-03-09 NOTE — Telephone Encounter (Signed)
 Pt c/o medication issue:  1. Name of Medication:   furosemide  (LASIX ) 40 MG tablet    2. How are you currently taking this medication (dosage and times per day)?    3. Are you having a reaction (difficulty breathing--STAT)? no  4. What is your medication issue? The dosage was reduce and its not helping, patient is having sob. Please advise

## 2024-03-10 MED ORDER — FUROSEMIDE 40 MG PO TABS: 40.0000 mg | ORAL_TABLET | Freq: Two times a day (BID) | ORAL | 3 refills | Status: AC

## 2024-03-10 NOTE — Telephone Encounter (Signed)
 Sent Rx for Lasix  40 mg BID to [facility] per Dr. Court. Patient notified and verbalized understanding.

## 2024-03-24 ENCOUNTER — Encounter: Payer: Self-pay | Admitting: Cardiovascular Disease

## 2024-03-24 ENCOUNTER — Ambulatory Visit: Attending: Cardiovascular Disease | Admitting: Cardiovascular Disease

## 2024-03-24 VITALS — BP 142/66 | HR 45 | Ht 75.0 in | Wt 192.1 lb

## 2024-03-24 DIAGNOSIS — I2510 Atherosclerotic heart disease of native coronary artery without angina pectoris: Secondary | ICD-10-CM | POA: Diagnosis not present

## 2024-03-24 DIAGNOSIS — I739 Peripheral vascular disease, unspecified: Secondary | ICD-10-CM

## 2024-03-24 DIAGNOSIS — Z9861 Coronary angioplasty status: Secondary | ICD-10-CM

## 2024-03-24 NOTE — Assessment & Plan Note (Signed)
 History of peripheral arterial disease status post bilateral iliac stenting with 7 mm x 29 mm long VBX covered stents by myself 12/21/2021.  His most recent Doppler studies performed 06/19/23 suggested patent stents.  He has noticed some heaviness in his legs during exercise.  He has Doppler scheduled for this coming April.

## 2024-03-24 NOTE — Assessment & Plan Note (Signed)
 History of CAD status post RCA stenting by myself 01/11/1998.  He had normal circumflex and LAD at that time.  He had a non-STEMI 01/21/2018 and had cath 2 days later that revealed left main disease.  He underwent CABG by Dr. Lucas 01/28/2018 with a LIMA to his LAD, vein to an obtuse marginal branch and PDA and PLA sequentially.  Since I saw him 5 weeks ago he has noticed some exertional chest tightness with radiation to his left upper extremity which is new for him.  He says this is somewhat different than his prior symptoms of CAD.  I am going to get a cardiac PET study to further evaluate.

## 2024-03-24 NOTE — Progress Notes (Signed)
 "     03/24/2024 [name]   1940/10/28  [id number]  Primary Physician [name] Trula SQUIBB, MD Primary Cardiologist: [name] JINNY Lesches MD FACP, [place], [place], FSCAI  HPI:  [name] is a 84 y.o.   mildly overweight, married Caucasian male father of 2, grandfather of 3 grandchildren who I saw  in the office 02/17/2024.[name]  His wife unfortunately has developed some dementia which is required him to perform additional tasks at home.  He has a history of CAD status post RCA stenting by myself January 11, 1998. He had normal circumflex, LAD and ejection fraction. His other problems include hypertension and chronic atrial fibrillation on Coumadin  anticoagulation, rate controlled, as well as erectile dysfunction. He is totally asymptomatic. He did have colon cancer picked up on screening colonoscopy and underwent right colectomy May 29, 2006 followed by 11 cycles of adjuvant Folfox chemotherapy. [name] He is very active exercises 6 days a week doing weight training 3 days a week, and lower extremity training 3 times a week.[name]   He was admitted to the hospital on 01/21/2018 with non-STEMI.  He underwent cardiac catheterization 2 days later by Dr. Mady revealing left main disease but disease in his LAD and RCA as well.  He underwent CABG by Dr. Lucas on 01/28/2018 with a LIMA to his LAD, vein to an obtuse marginal branch and to the PDA and PLA sequentially.  He also had left atrial clipping at that time.    He ended up completing cardiac rehab as an outpatient.   He saw Reche Finder, NP in the office 12/14/2020 who placed him on a low-dose diuretic and obtain a 2D echocardiogram which was performed 12/20/2020 revealing severe TR with pulmonary hypertension which is new for him.  He is fairly active but does complain of dyspnea on exertion which is a new symptom.     His symptoms of dyspnea significantly improved with the addition of a diuretic.  I did go chest CTA that showed no evidence of thromboembolic disease.   In addition, I did lower extremity arterial Doppler studies that show significant iliac disease bilaterally.  He does complain of lifestyle limiting claudication.  He had a right heart cath performed by Dr. Cherrie 06/14/2021 that showed mild pulmonary hypertension although he had V waves in both the right atrium and the wedge positions suggesting MR and TR.  He had a sleep study that showed severe obstructive sleep apnea.  He does have an appointment with Dr. Bensimhon at the end of May to discuss the right heart findings.  He may need a cardiac MRI and a TEE to further evaluate.  A chest CTA was negative for pulmonary thromboembolic disease as well as a VQ scan.  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   He has lost significant mount of weight as a result of the needed metolazone  which I placed him on.  His peripheral edema has completely resolved.  His dyspnea has resolved as well and he feels clinically improved.  He currently denies chest pain or shortness of breath.  His major complaint is lifestyle-limiting claudication with Doppler studies performed 04/02/2021 that revealed bilateral iliac disease .   I performed peripheral angiography on him 12/21/2021 revealing significant bilateral iliac disease which I stented with a 7 mm x 29 mm long VBX covered stents.  He did have tandem lesions  in both SFAs.  He was discharged home the following day.  His claudication is markedly improved.  Postprocedural Doppler studies performed 01/05/2022 revealed the stents to be widely patent.   He was admitted to the hospital 6/24 with near syncope and thought to be related to dehydration.  He had a monitor which was placed that revealed A-fib with 100% burden, average heart rate in the 40s with some short runs of nonsustained ventricular tachycardia.  He is rejoined the gym and is  going back to exercising.  Since I stented his iliac arteries 12/21/2021 he denies claudication and his most recent Doppler studies 06/19/2023 suggested that his stents have remained patent.   He was working out in the gym 5 days a week right stationary bike, walking the treadmill and lifting weights.  He walks [phone] steps a day.   He does complain of some imbalance and is seeing a physical therapist for this.  He continues to deny claudication.   He wore a monitor that he wore for 2 weeks that showed predominantly junctional bradycardia with short runs of SVT and NSVT although he is completely asymptomatic.  He does have a history of chronic A-fib on Eliquis  oral anticoagulation.  Since I saw him 5 weeks ago he has noticed some new onset exertional chest heaviness with left upper extremity radiation which is new since I saw him last.  He has also noticed some heaviness in his legs when exercising.   Active Medications[1]   Allergies[2]  Social History   Socioeconomic History   Marital status: Married    Spouse name: [name]   Number of children: 2   Years of education: Not on file   Highest education level: Bachelor's degree (e.g., BA, AB, BS)  Occupational History   Occupation: retired    Associate Professor: RETIRED  Tobacco Use   Smoking status: Former    Current packs/day: 0.00    Types: Cigarettes    Start date: 11/26/1963    Quit date: 11/25/1993    Years since quitting: 30.3   Smokeless tobacco: Never  Vaping Use   Vaping status: Never Used  Substance and Sexual Activity   Alcohol  use: Not Currently    Alcohol /week: 1.0 standard drink of alcohol     Types: 1 Cans of beer per week   Drug use: No   Sexual activity: Not on file  Other Topics Concern   Not on file  Social History Narrative   Not on file   Social Drivers of Health   Tobacco Use: Medium Risk (03/24/2024)   Patient History    Smoking Tobacco Use: Former    Smokeless Tobacco Use: Never    Passive Exposure: Not on  Actuary Strain: Not on file  Food Insecurity: No Food Insecurity (12/09/2023)   Epic    Worried About Programme Researcher, Broadcasting/film/video in the Last Year: Never true    Ran Out of Food in the Last Year: Never true  Transportation Needs: No Transportation Needs (12/09/2023)   Epic    Lack of Transportation (Medical): No    Lack of Transportation (Non-Medical): No  Physical Activity: Not on file  Stress: Not on file  Social Connections: Socially Integrated (12/09/2023)   Social Connection and Isolation Panel    Frequency of Communication with Friends and Family: More than three times a week    Frequency of Social Gatherings with Friends and Family: More than three times a week    Attends Religious Services: More than  4 times per year    Active Member of Clubs or Organizations: Yes    Attends Banker Meetings: More than 4 times per year    Marital Status: Married  Catering Manager Violence: Not At Risk (12/09/2023)   Epic    Fear of Current or Ex-Partner: No    Emotionally Abused: No    Physically Abused: No    Sexually Abused: No  Depression (PHQ2-9): Not on file  Alcohol  Screen: Not on file  Housing: Low Risk (12/09/2023)   Epic    Unable to Pay for Housing in the Last Year: No    Number of Times Moved in the Last Year: 0    Homeless in the Last Year: No  Utilities: Not At Risk (12/09/2023)   Epic    Threatened with loss of utilities: No  Health Literacy: Not on file     Review of Systems: General: negative for chills, fever, night sweats or weight changes.  Cardiovascular: negative for chest pain, dyspnea on exertion, edema, orthopnea, palpitations, paroxysmal nocturnal dyspnea or shortness of breath Dermatological: negative for rash Respiratory: negative for cough or wheezing Urologic: negative for hematuria Abdominal: negative for nausea, vomiting, diarrhea, bright red blood per rectum, melena, or hematemesis Neurologic: negative for visual changes, syncope,  or dizziness All other systems reviewed and are otherwise negative except as noted above.    Blood pressure (!) 142/66, pulse (!) 45, height 6' 3 (1.905 m), weight 192 lb 1.6 oz (87.1 kg), SpO2 97%.  General appearance: alert and no distress Neck: no adenopathy, no carotid bruit, no JVD, supple, symmetrical, trachea midline, and thyroid  not enlarged, symmetric, no tenderness/mass/nodules Lungs: clear to auscultation bilaterally Heart: irregularly irregular rhythm Extremities: extremities normal, atraumatic, no cyanosis or edema Pulses: Diminished pedal pulses Skin: Skin color, texture, turgor normal. No rashes or lesions Neurologic: Grossly normal  EKG not performed today      ASSESSMENT AND PLAN:   CAD S/P percutaneous coronary angioplasty History of CAD status post RCA stenting by myself 01/11/1998.  He had normal circumflex and LAD at that time.  He had a non-STEMI 01/21/2018 and had cath 2 days later that revealed left main disease.  He underwent CABG by Dr. Lucas 01/28/2018 with a LIMA to his LAD, vein to an obtuse marginal branch and PDA and PLA sequentially.  Since I saw him 5 weeks ago he has noticed some exertional chest tightness with radiation to his left upper extremity which is new for him.  He says this is somewhat different than his prior symptoms of CAD.  I am going to get a cardiac PET study to further evaluate.  Claudication in peripheral vascular disease History of peripheral arterial disease status post bilateral iliac stenting with 7 mm x 29 mm long VBX covered stents by myself 12/21/2021.  His most recent Doppler studies performed 06/19/23 suggested patent stents.  He has noticed some heaviness in his legs during exercise.  He has Doppler scheduled for this coming April.     [name] DOROTHA Lesches MD FACP,FACC,FAHA, FSCAI 03/24/2024 9:39 AM    [1]  Current Meds  Medication Sig   apixaban  (ELIQUIS ) 5 MG TABS tablet Take 5 mg by mouth 2 (two) times daily.    atorvastatin  (LIPITOR ) 80 MG tablet TAKE ONE TABLET AT SIX IN THE EVENING.   cholecalciferol  (VITAMIN D ) 1000 UNITS tablet Take 1,000 Units by mouth daily.   Cyanocobalamin  (VITAMIN B 12 PO) Take 200 mcg by mouth daily.   dapagliflozin   propanediol (FARXIGA ) 10 MG TABS tablet Take 1 tablet (10 mg total) by mouth daily.   furosemide  (LASIX ) 40 MG tablet Take 1 tablet (40 mg total) by mouth 2 (two) times daily.   Magnesium  Citrate 200 MG TABS Take 400 mg by mouth daily in the afternoon.   metolazone  (ZAROXOLYN ) 2.5 MG tablet Take 2.5 mg by mouth 3 (three) times a week.   Polyethyl Glycol-Propyl Glycol (SYSTANE) 0.4-0.3 % GEL ophthalmic gel Place 1 application. into both eyes every 8 (eight) hours as needed (dry eyes).   potassium chloride  SA (KLOR-CON  M) 20 MEQ tablet Take 1 tablet (20 mEq total) by mouth 2 (two) times daily.   pyridoxine (B-6) 200 MG tablet Take 200 mg by mouth daily.   spironolactone  (ALDACTONE ) 25 MG tablet Take 1 tablet (25 mg total) by mouth daily.   vitamin C (ASCORBIC ACID) 500 MG tablet Take 500 mg by mouth 3 (three) times daily.    Zinc 50 MG TABS Take 1 tablet by mouth daily.  [2] No Known Allergies  "

## 2024-03-24 NOTE — Addendum Note (Signed)
 Addended by: LORRENE FEDERICO CROME on: 03/24/2024 09:48 AM   Modules accepted: Orders

## 2024-03-24 NOTE — Addendum Note (Signed)
 Addended by: LORRENE FEDERICO CROME on: 03/24/2024 10:06 AM   Modules accepted: Orders

## 2024-03-24 NOTE — Addendum Note (Signed)
 Addended by: COURT DORN PARAS on: 03/24/2024 10:05 AM   Modules accepted: Orders

## 2024-03-24 NOTE — Patient Instructions (Signed)
 Medication Instructions:  Your physician recommends that you continue on your current medications as directed. Please refer to the Current Medication list given to you today.  *If you need a refill on your cardiac medications before your next appointment, please call your pharmacy*  Testing/Procedures: See below  Follow-Up: At [facility], you and your health needs are our priority.  As part of our continuing mission to provide you with exceptional heart care, our providers are all part of one team.  This team includes your primary Cardiologist (physician) and Advanced Practice Providers or APPs (Physician Assistants and Nurse Practitioners) who all work together to provide you with the care you need, when you need it.  Your next appointment:   2 month(s)  Provider:   Lum Louis, NP, Aline Door, PA-C, Kathleen Johnson, PA-C, Hao Meng, PA-C, Damien Braver, NP, or Katlyn West, NP         Then, Dorn Lesches, MD will plan to see you again in 6 month(s).   We recommend signing up for the patient portal called MyChart.  Sign up information is provided on this After Visit Summary.  MyChart is used to connect with patients for Virtual Visits (Telemedicine).  Patients are able to view lab/test results, encounter notes, upcoming appointments, etc.  Non-urgent messages can be sent to your provider as well.   To learn more about what you can do with MyChart, go to [email or web address].   Other Instructions    Please report to Radiology at the [facility] Main Entrance 30 minutes early for your test.  [address] [place], [place] [address]                         OR   Please report to Radiology at [facility] Main Entrance, medical mall, 30 mins prior to your test.  [address]  [place], [place]  How to Prepare for Your Cardiac PET/CT Stress Test:  Nothing to eat or drink, except water, 3 hours prior to arrival time.  NO  caffeine/decaffeinated products, or chocolate 12 hours prior to arrival. (Please note decaffeinated beverages (teas/coffees) still contain caffeine).  If you have caffeine within 12 hours prior, the test will need to be rescheduled.  Medication instructions: Do not take erectile dysfunction medications for 72 hours prior to test (sildenafil, tadalafil) Do not take nitrates (isosorbide mononitrate, Ranexa) the day before or day of test Do not take tamsulosin the day before or morning of test Hold theophylline containing medications for 12 hours. Hold Dipyridamole 48 hours prior to the test.  Diabetic Preparation: If able to eat breakfast prior to 3 hour fasting, you may take all medications, including your insulin . Do not worry if you miss your breakfast dose of insulin  - start at your next meal. If you do not eat prior to 3 hour fast-Hold all diabetes (oral and insulin ) medications. Patients who wear a continuous glucose monitor MUST remove the device prior to scanning.  You may take your remaining medications with water.  NO perfume, cologne or lotion on chest or abdomen area. FEMALES - Please avoid wearing dresses to this appointment.  Total time is 1 to 2 hours; you may want to bring reading material for the waiting time.  IF YOU THINK YOU MAY BE PREGNANT, OR ARE NURSING PLEASE INFORM THE TECHNOLOGIST.  In preparation for your appointment, medication and supplies will be purchased.  Appointment availability is limited, so if you  need to cancel or reschedule, please call the Radiology Department Scheduler at [phone] 24 hours in advance to avoid a cancellation fee of $100.00  What to Expect When you Arrive:  Once you arrive and check in for your appointment, you will be taken to a preparation room within the Radiology Department.  A technologist or Nurse will obtain your medical history, verify that you are correctly prepped for the exam, and explain the procedure.  Afterwards, an  IV will be started in your arm and electrodes will be placed on your skin for EKG monitoring during the stress portion of the exam. Then you will be escorted to the PET/CT scanner.  There, staff will get you positioned on the scanner and obtain a blood pressure and EKG.  During the exam, you will continue to be connected to the EKG and blood pressure machines.  A small, safe amount of a radioactive tracer will be injected in your IV to obtain a series of pictures of your heart along with an injection of a stress agent.    After your Exam:  It is recommended that you eat a meal and drink a caffeinated beverage to counter act any effects of the stress agent.  Drink plenty of fluids for the remainder of the day and urinate frequently for the first couple of hours after the exam.  Your doctor will inform you of your test results within 7-10 business days.  For more information and frequently asked questions, please visit our website: [email or web address]  For questions about your test or how to prepare for your test, please call: Cardiac Imaging Nurse Navigators Office: [phone]

## 2024-04-17 ENCOUNTER — Encounter (HOSPITAL_COMMUNITY): Payer: Self-pay

## 2024-04-20 ENCOUNTER — Telehealth (HOSPITAL_COMMUNITY): Payer: Self-pay | Admitting: Emergency Medicine

## 2024-04-20 NOTE — Telephone Encounter (Signed)
Attempted to call patient regarding upcoming cardiac PET appointment. Left message on voicemail with name and callback number Aidan Moten RN Navigator Cardiac Imaging [facility] [phone] Office [phone] Cell  

## 2024-04-21 ENCOUNTER — Encounter (HOSPITAL_COMMUNITY): Payer: Self-pay

## 2024-04-21 ENCOUNTER — Ambulatory Visit (HOSPITAL_COMMUNITY): Admission: RE | Admit: 2024-04-21 | Source: Ambulatory Visit

## 2024-05-20 ENCOUNTER — Other Ambulatory Visit (HOSPITAL_COMMUNITY)

## 2024-05-22 ENCOUNTER — Ambulatory Visit: Admitting: Emergency Medicine

## 2024-06-19 ENCOUNTER — Ambulatory Visit (HOSPITAL_COMMUNITY)
